# Patient Record
Sex: Male | Born: 1980
Health system: Southern US, Community
[De-identification: ages and names within clinical notes are randomized; demographics above are authoritative.]

## PROBLEM LIST (undated history)

## (undated) DIAGNOSIS — D571 Sickle-cell disease without crisis: Secondary | ICD-10-CM

## (undated) DIAGNOSIS — C801 Malignant (primary) neoplasm, unspecified: Secondary | ICD-10-CM

## (undated) HISTORY — DX: Malignant (primary) neoplasm, unspecified: C80.1

## (undated) HISTORY — PX: CHOLECYSTECTOMY: SHX55

---

## 2007-09-09 ENCOUNTER — Emergency Department (HOSPITAL_COMMUNITY): Admission: EM | Admit: 2007-09-09 | Discharge: 2007-09-09 | Payer: Self-pay | Admitting: Emergency Medicine

## 2007-09-12 ENCOUNTER — Inpatient Hospital Stay (HOSPITAL_COMMUNITY): Admission: EM | Admit: 2007-09-12 | Discharge: 2007-09-15 | Payer: Self-pay | Admitting: Emergency Medicine

## 2007-11-12 ENCOUNTER — Inpatient Hospital Stay (HOSPITAL_COMMUNITY): Admission: EM | Admit: 2007-11-12 | Discharge: 2007-11-13 | Payer: Self-pay | Admitting: Emergency Medicine

## 2007-12-31 ENCOUNTER — Emergency Department (HOSPITAL_COMMUNITY): Admission: EM | Admit: 2007-12-31 | Discharge: 2007-12-31 | Payer: Self-pay | Admitting: Emergency Medicine

## 2008-03-05 ENCOUNTER — Inpatient Hospital Stay (HOSPITAL_COMMUNITY): Admission: EM | Admit: 2008-03-05 | Discharge: 2008-03-10 | Payer: Self-pay | Admitting: Emergency Medicine

## 2008-03-24 ENCOUNTER — Ambulatory Visit: Payer: Self-pay | Admitting: Internal Medicine

## 2008-04-10 ENCOUNTER — Emergency Department (HOSPITAL_COMMUNITY): Admission: EM | Admit: 2008-04-10 | Discharge: 2008-04-10 | Payer: Self-pay | Admitting: Emergency Medicine

## 2009-07-31 ENCOUNTER — Inpatient Hospital Stay (HOSPITAL_COMMUNITY): Admission: EM | Admit: 2009-07-31 | Discharge: 2009-08-06 | Payer: Self-pay | Admitting: Emergency Medicine

## 2009-09-08 ENCOUNTER — Ambulatory Visit: Payer: Self-pay | Admitting: Internal Medicine

## 2009-09-08 LAB — CONVERTED CEMR LAB
Eosinophils Absolute: 0.1 10*3/uL (ref 0.0–0.7)
Eosinophils Relative: 2 % (ref 0–5)
HCT: 28 % — ABNORMAL LOW (ref 39.0–52.0)
Hemoglobin: 9.6 g/dL — ABNORMAL LOW (ref 13.0–17.0)
Lymphs Abs: 2.7 10*3/uL (ref 0.7–4.0)
MCV: 95.2 fL (ref 78.0–100.0)
Monocytes Relative: 10 % (ref 3–12)
RBC: 2.94 M/uL — ABNORMAL LOW (ref 4.22–5.81)
WBC: 5.7 10*3/uL (ref 4.0–10.5)

## 2009-11-16 ENCOUNTER — Ambulatory Visit: Payer: Self-pay | Admitting: Internal Medicine

## 2010-07-22 ENCOUNTER — Inpatient Hospital Stay (HOSPITAL_COMMUNITY): Admission: EM | Admit: 2010-07-22 | Discharge: 2010-03-20 | Payer: Self-pay | Admitting: Emergency Medicine

## 2010-09-04 ENCOUNTER — Inpatient Hospital Stay (HOSPITAL_COMMUNITY)
Admission: EM | Admit: 2010-09-04 | Discharge: 2010-09-09 | Payer: Self-pay | Source: Home / Self Care | Attending: Internal Medicine | Admitting: Internal Medicine

## 2010-09-07 LAB — CBC
HCT: 21.7 % — ABNORMAL LOW (ref 39.0–52.0)
Hemoglobin: 8.1 g/dL — ABNORMAL LOW (ref 13.0–17.0)
MCH: 30.3 pg (ref 26.0–34.0)
MCHC: 35.8 g/dL (ref 30.0–36.0)
MCHC: 36.8 g/dL — ABNORMAL HIGH (ref 30.0–36.0)
MCHC: 36.9 g/dL — ABNORMAL HIGH (ref 30.0–36.0)
Platelets: 502 10*3/uL — ABNORMAL HIGH (ref 150–400)
RDW: 19.9 % — ABNORMAL HIGH (ref 11.5–15.5)
RDW: 19.8 % — ABNORMAL HIGH (ref 11.5–15.5)
WBC: 11.5 10*3/uL — ABNORMAL HIGH (ref 4.0–10.5)

## 2010-09-07 LAB — URINALYSIS, DIPSTICK ONLY
Ketones, ur: NEGATIVE mg/dL
Nitrite: NEGATIVE
Protein, ur: NEGATIVE mg/dL
Specific Gravity, Urine: 1.013 (ref 1.005–1.030)
Urine Glucose, Fasting: NEGATIVE mg/dL
pH: 6 (ref 5.0–8.0)

## 2010-09-07 LAB — BASIC METABOLIC PANEL
BUN: 7 mg/dL (ref 6–23)
Calcium: 8.5 mg/dL (ref 8.4–10.5)
Creatinine, Ser: 0.8 mg/dL (ref 0.4–1.5)
Glucose, Bld: 131 mg/dL — ABNORMAL HIGH (ref 70–99)
Potassium: 4 mEq/L (ref 3.5–5.1)

## 2010-09-07 LAB — DIFFERENTIAL
Basophils Absolute: 0 10*3/uL (ref 0.0–0.1)
Basophils Relative: 0 % (ref 0–1)
Basophils Relative: 0 % (ref 0–1)
Eosinophils Absolute: 0.1 10*3/uL (ref 0.0–0.7)
Eosinophils Absolute: 0.1 10*3/uL (ref 0.0–0.7)
Eosinophils Absolute: 0.1 10*3/uL (ref 0.0–0.7)
Eosinophils Relative: 0 % (ref 0–5)
Eosinophils Relative: 1 % (ref 0–5)
Lymphocytes Relative: 22 % (ref 12–46)
Lymphs Abs: 2.4 10*3/uL (ref 0.7–4.0)
Monocytes Absolute: 1.5 10*3/uL — ABNORMAL HIGH (ref 0.1–1.0)
Neutro Abs: 13.3 10*3/uL — ABNORMAL HIGH (ref 1.7–7.7)
Neutro Abs: 6.4 10*3/uL (ref 1.7–7.7)
Neutrophils Relative %: 64 % (ref 43–77)

## 2010-09-07 LAB — COMPREHENSIVE METABOLIC PANEL
ALT: 16 U/L (ref 0–53)
AST: 35 U/L (ref 0–37)
Albumin: 3.7 g/dL (ref 3.5–5.2)
Alkaline Phosphatase: 51 U/L (ref 39–117)
Calcium: 9 mg/dL (ref 8.4–10.5)
GFR calc Af Amer: >60 mL/min (ref >60)
Potassium: 4.1 mEq/L (ref 3.5–5.1)
Sodium: 138 mEq/L (ref 135–145)
Total Protein: 7.7 g/dL (ref 6.0–8.3)

## 2010-09-07 LAB — RAPID URINE DRUG SCREEN, HOSP PERFORMED: Benzodiazepines: NOT DETECTED

## 2010-09-07 LAB — TSH
TSH: 1.741 u[IU]/mL (ref 0.350–4.500)
TSH: 1.122 u[IU]/mL (ref 0.350–4.500)

## 2010-09-07 LAB — RETICULOCYTES
Retic Count, Absolute: 247.8 10*3/uL — ABNORMAL HIGH (ref 19.0–186.0)
Retic Ct Pct: 8.4 % — ABNORMAL HIGH (ref 0.4–3.1)

## 2010-09-07 LAB — URINE CULTURE
Colony Count: NO GROWTH
Culture: NO GROWTH

## 2010-09-08 LAB — CBC
MCH: 29.8 pg (ref 26.0–34.0)
MCH: 28.9 pg (ref 26.0–34.0)
MCHC: 36.8 g/dL — ABNORMAL HIGH (ref 30.0–36.0)
MCV: 80.9 fL (ref 78.0–100.0)
MCV: 81.6 fL (ref 78.0–100.0)
Platelets: 313 10*3/uL (ref 150–400)
Platelets: 321 10*3/uL (ref 150–400)
RBC: 2.35 MIL/uL — ABNORMAL LOW (ref 4.22–5.81)
RDW: 18.7 % — ABNORMAL HIGH (ref 11.5–15.5)
WBC: 8.5 10*3/uL (ref 4.0–10.5)

## 2010-09-08 LAB — CROSSMATCH

## 2010-09-09 LAB — CBC
Hemoglobin: 8 g/dL — ABNORMAL LOW (ref 13.0–17.0)
MCH: 29.7 pg (ref 26.0–34.0)
MCV: 82.9 fL (ref 78.0–100.0)
RBC: 2.69 MIL/uL — ABNORMAL LOW (ref 4.22–5.81)

## 2010-09-09 LAB — COMPREHENSIVE METABOLIC PANEL
Albumin: 3.6 g/dL (ref 3.5–5.2)
BUN: 5 mg/dL — ABNORMAL LOW (ref 6–23)
Creatinine, Ser: 0.7 mg/dL (ref 0.4–1.5)
GFR calc Af Amer: >60 mL/min (ref >60)
Potassium: 4.4 mEq/L (ref 3.5–5.1)
Total Protein: 8 g/dL (ref 6.0–8.3)

## 2010-09-11 LAB — CULTURE, BLOOD (ROUTINE X 2)
Culture  Setup Time: 201201221142
Culture: NO GROWTH
Culture: NO GROWTH

## 2010-09-13 ENCOUNTER — Encounter (INDEPENDENT_AMBULATORY_CARE_PROVIDER_SITE_OTHER): Payer: Self-pay | Admitting: *Deleted

## 2010-09-13 LAB — CONVERTED CEMR LAB
Cocaine Metabolites: NEGATIVE
Creatinine,U: 106.1 mg/dL
Marijuana Metabolite: NEGATIVE
Opiate Screen, Urine: NEGATIVE
Phencyclidine (PCP): NEGATIVE

## 2010-09-16 NOTE — Discharge Summary (Signed)
  NAMEHUTCHINSON, ISENBERG               ACCOUNT NO.:  [id number]  MEDICAL RECORD NO.:  [id number]          PATIENT TYPE:  INP  LOCATION:  1309                         FACILITY:  [facility]  PHYSICIAN:  Pincus Large, MD     DATE OF BIRTH:  Jun 07, 1981  DATE OF ADMISSION:  09/04/2010 DATE OF DISCHARGE:  09/06/2010                              DISCHARGE SUMMARY   PRIMARY CARE PHYSICIAN:  Dineen Kid. Reche Dixon, M.D. at [facility].  REASON FOR ADMISSION:  Pain all over his back and right hip.  DISCHARGE DIAGNOSES: 1. Sickle cell crisis. 2. Leukocytosis. 3. Sickle cell anemia.  MEDICATIONS ON DISCHARGE: 1. Folic acid 1 mg daily. 2. Oxycodone/acetaminophen 10/650 mg 1 tablet q.4 h. p.r.n. 3. Hydroxyurea 500 mg 2 tablets b.i.d.  HISTORY OF PRESENT ILLNESS:  A 30 year old African American male with past medical history of sickle cell anemia, who presented to [facility] Emergency Room with complaint of back pain and right hip pain similar to a sickle cell crisis.  The patient is stating that he is having pain in his back off and on over the past several days; however, it became __________ worse, described pain as throbbing and aching in nature, was typically for his crisis.  The patient was treated in the emergency room initially and he had some mild relief, and it was found that his WBC on admission was 16.8.  PAST MEDICAL HISTORY:  As per the H and P.  PHYSICAL EXAMINATION:  As per the H and P.  HOSPITAL COURSE:  The patient was admitted to hospitalist service for future treatment of the sickle cell crisis.  He received IV fluid 150 cc/hour.  The patient also received oxygen to keep sats above 90.  The patient also received IV pain medication, Dilaudid every 2 hours.  A chest x-ray was done, which shows a stable cardiomegaly with pulmonary vascular congestion.  No acute findings.  Hip x-ray was done, which shows normal exam.  The patient's blood count dropped to 7 on January 23rd.  He was  getting 1 unit of blood transfusion.  He said that his normal blood count runs around 8.  The patient's vital signs, temperature is 99.4, pulse is 96, respirations 18, blood pressure is 127/73, saturating at 96% on room air.  DISPOSITION:  Home.  DIET:  Regular.  FOLLOWUP:  Follow up with primary care in 2 weeks.          ______________________________ Pincus Large, MD     SA/MEDQ  D:  09/07/2010  T:  09/07/2010  Job:  [id number]  Electronically Signed by Pincus Large MD on 09/16/2010 03:33:30 PM

## 2010-09-16 NOTE — H&P (Signed)
NAMEAJANI, [name]               ACCOUNT NO.:  [id number]  MEDICAL RECORD NO.:  [id number]          PATIENT TYPE:  INP  LOCATION:  0101                         FACILITY:  [facility]  PHYSICIAN:  Pincus Large, MD     DATE OF BIRTH:  1981/06/30  DATE OF ADMISSION:  09/04/2010 DATE OF DISCHARGE:                             HISTORY & PHYSICAL   PRIMARY CARE PHYSICIAN:  Dineen Kid. Reche Dixon, MD, [facility].  CHIEF COMPLAINT:  "Pain all over my body except in the lower back."  HISTORY OF PRESENT ILLNESS:  [name] is a 30 year old African American male with a history of sickle cell anemia, sickle cell disease who presented to [facility] with complaint of back pain, rib pain, right hip pain, similar to his sickle cell crisis.  The pain was described as 8/10, dull aching in the lower back, rib pain and pain in the right hip.  The patient denies any chest pain.  He denies any shortness of breath.  He denies any cough.  He denies any fever. Otherwise, the patient does not have any dyspnea.  He denies any abdominal pain.  No nausea or vomiting.  No headache.  No blurring of vision.  In the ER, he was found to have white blood cell of 16,000 and his hemoglobin was 8.  PAST MEDICAL HISTORY:  Sickle cell.  PAST SURGICAL HISTORY:  None.  SOCIAL HISTORY:  Denies any alcohol or smoking.  ALLERGY:  No known drug allergies.  REVIEW OF SYSTEMS:  All systems were reviewed including HEENT, GI, GU, CVS, CNS, allergy-immunology including musculoskeletal.  Findings were except for what is mentioned in the H and P.  PHYSICAL EXAMINATION:  GENERAL:  He is awake, oriented with mild distress.  HEENT:  Normocephalic, atraumatic.  Conjunctivae pink. Pupils equal, reactive to light.  NECK:  Supple.  No JVD.  No thyromegaly.  CARDIOVASCULAR:  S1, S2, tachycardia.  No murmurs, gallops or rubs bilaterally.  LUNGS:  No wheezes or crackles.  ABDOMEN:  Soft, nontender, nondistended.  Normal bowel sounds.   EXTREMITIES:  No joint effusion or any joint tenderness.  SKIN:  No rash.  NEUROLOGIC: Nonfocal.  PERTINENT LABORATORY AND X-RAY DATA:  Lab shows WBC is 16.8, hemoglobin is 8.8, platelet is 502, reticulocyte count is 8.4.  X-ray was not done.  ASSESSMENT AND PLAN: 1. Acute sickle cell crisis:  The patient will be placed on IV fluids,     oxygen supplement, pain control.  Will start Dilaudid p.r.n. with     oxycodone for breakthrough pain.  Also continue with hydroxyurea     and folic acid. 2. Leukocytosis:  The patient is afebrile and does not appear to have     any source of infection.  His WBC is     16,000.  I will start the patient empirically on Rocephin, get a     chest x-ray and x-ray of the right hip. 3. Acute sickle cell anemia:  Currently hemoglobin is 8.  I will     transfuse only if the H and H is less than 8. 4. Prophylaxis:  Lovenox for deep  venous thrombosis prophylaxis.          ______________________________ Pincus Large, MD     SA/MEDQ  D:  09/04/2010  T:  09/04/2010  Job:  [id number]  Electronically Signed by Pincus Large MD on 09/16/2010 03:33:32 PM

## 2010-09-30 NOTE — Discharge Summary (Signed)
  [name], [name]               ACCOUNT NO.:  [id number]  MEDICAL RECORD NO.:  [id number]          PATIENT TYPE:  INP  LOCATION:  1309                         FACILITY:  [facility]  PHYSICIAN:  Izeah Vossler L. August Saucer, M.D.     DATE OF BIRTH:  [birth year]  DATE OF ADMISSION:  09/04/2010 DATE OF DISCHARGE:  09/09/2010                              DISCHARGE SUMMARY   FINAL DIAGNOSES: 1. Sickle cell crisis. 2. Colonic dysfunction. 3. Transient leukocytosis, resolved.  OPERATIONS AND PROCEDURES:  Transfusion of 1 unit of packed RBCs.  HISTORY OF PRESENT ILLNESS:  [name] is a 30 year old black male with history of sickle cell anemia who presented to [facility] with complaints of back pain, rib pain, and right hip pain similar to his sickle cell crisis.  The patient described the pain as 8/10 which was a dull ache in the lower back, ribs, and right hip.  The patient denied any chest pain.  He denied any shortness of breath or cough.  There was no fever as well.  The patient was treated initially in the emergency room.  He was found at the time of evaluation to have a hemoglobin of 8 with a white count of 16,000.  After failure to improve, he was admitted to the hospitalist service for further therapy.  His primary care physician is Dr. Reche Dixon at [facility].  Past medical history and physical exam as per admission H and P.  HOSPITAL COURSE:  The patient was admitted for further treatment of acute sickle cell crisis.  He was noted to have significant leukocytosis as well.  He was subsequently started on Rocephin empirically for occult infection.  He was also placed on Lovenox for DVT prophylaxis.  The patient was maintained on IV fluids as well as Dilaudid for control of his pain.  The patient was continued over the subsequent days with the same regimen with slow improvement.  The patient was subsequently transferred to my service on September 08, 2010.  At that time, he was making  considerable progress with his pain being decreased to 5/10.  He felt that he could manage his pain at home.  His medication was adjusted thereafter.  He was started on p.o. Percocet.  The following day, he continued to improve.  He felt that his pain was stable.  The patient was subsequently felt to be stable for discharge.  He was subsequently discharged home on September 09, 2010.  DISCHARGE MEDICATIONS: 1. Dilaudid 4 mg q.4 h. p.r.n. 2. Folic acid 1 mg daily. 3. Hydroxyurea 500 mg 2 capsules b.i.d. 4. Oxycodone with acetaminophen 1-2 tablets q.4 h. p.r.n.  DIET:  The patient will be maintained on a regular diet.  FOLLOWUP:  He has been advised to follow up with Dr. Reche Dixon in 2 weeks' time. Total time for discharge was greater than 30 minutes.          ______________________________ Lind Guest August Saucer, M.D.     ELD/MEDQ  D:  09/29/2010  T:  09/30/2010  Job:  [id number]  Electronically Signed by Willey Blade M.D. on 09/30/2010 07:27:48 AM

## 2010-10-29 LAB — RETICULOCYTES
RBC.: 3.13 MIL/uL — ABNORMAL LOW (ref 4.22–5.81)
Retic Count, Absolute: 194.1 10*3/uL — ABNORMAL HIGH (ref 19.0–186.0)

## 2010-10-29 LAB — URINALYSIS, ROUTINE W REFLEX MICROSCOPIC
Glucose, UA: NEGATIVE mg/dL
Ketones, ur: NEGATIVE mg/dL
Nitrite: NEGATIVE
Protein, ur: NEGATIVE mg/dL
Urobilinogen, UA: 1 mg/dL (ref 0.0–1.0)

## 2010-10-29 LAB — CBC
HCT: 27.1 % — ABNORMAL LOW (ref 39.0–52.0)
HCT: 29.4 % — ABNORMAL LOW (ref 39.0–52.0)
Hemoglobin: 10.1 g/dL — ABNORMAL LOW (ref 13.0–17.0)
Hemoglobin: 7.7 g/dL — ABNORMAL LOW (ref 13.0–17.0)
Hemoglobin: 9.2 g/dL — ABNORMAL LOW (ref 13.0–17.0)
MCHC: 34.5 g/dL (ref 30.0–36.0)
MCV: 92.3 fL (ref 78.0–100.0)
MCV: 93.9 fL (ref 78.0–100.0)
MCV: 94.1 fL (ref 78.0–100.0)
Platelets: 298 10*3/uL (ref 150–400)
Platelets: 311 10*3/uL (ref 150–400)
RBC: 2.47 MIL/uL — ABNORMAL LOW (ref 4.22–5.81)
RBC: 3.18 MIL/uL — ABNORMAL LOW (ref 4.22–5.81)
RDW: 19.6 % — ABNORMAL HIGH (ref 11.5–15.5)
RDW: 20.2 % — ABNORMAL HIGH (ref 11.5–15.5)
RDW: 20.3 % — ABNORMAL HIGH (ref 11.5–15.5)
WBC: 10.4 10*3/uL (ref 4.0–10.5)
WBC: 16.2 10*3/uL — ABNORMAL HIGH (ref 4.0–10.5)
WBC: 21.1 10*3/uL — ABNORMAL HIGH (ref 4.0–10.5)
WBC: 8.3 10*3/uL (ref 4.0–10.5)
WBC: 9 10*3/uL (ref 4.0–10.5)

## 2010-10-29 LAB — CK TOTAL AND CKMB (NOT AT ARMC)
CK, MB: 0.4 ng/mL (ref 0.3–4.0)
CK, MB: 0.4 ng/mL (ref 0.3–4.0)
Relative Index: INVALID (ref 0.0–2.5)
Relative Index: INVALID (ref 0.0–2.5)
Total CK: 31 U/L (ref 7–232)
Total CK: 33 U/L (ref 7–232)
Total CK: 39 U/L (ref 7–232)

## 2010-10-29 LAB — BASIC METABOLIC PANEL
BUN: 6 mg/dL (ref 6–23)
Chloride: 104 mEq/L (ref 96–112)
GFR calc Af Amer: >60 mL/min (ref >60)
GFR calc non Af Amer: >60 mL/min (ref >60)
Potassium: 4.2 mEq/L (ref 3.5–5.1)
Potassium: 4.4 mEq/L (ref 3.5–5.1)
Sodium: 139 mEq/L (ref 135–145)

## 2010-10-29 LAB — CULTURE, BLOOD (ROUTINE X 2): Culture: NO GROWTH

## 2010-10-29 LAB — DIFFERENTIAL
Basophils Relative: 0 % (ref 0–1)
Eosinophils Relative: 0 % (ref 0–5)
Lymphocytes Relative: 12 % (ref 12–46)
Monocytes Relative: 1 % — ABNORMAL LOW (ref 3–12)
Neutro Abs: 18.4 10*3/uL — ABNORMAL HIGH (ref 1.7–7.7)

## 2010-10-29 LAB — COMPREHENSIVE METABOLIC PANEL
AST: 24 U/L (ref 0–37)
Albumin: 3.8 g/dL (ref 3.5–5.2)
Alkaline Phosphatase: 53 U/L (ref 39–117)
Chloride: 106 mEq/L (ref 96–112)
GFR calc Af Amer: >60 mL/min (ref >60)
Potassium: 3.7 mEq/L (ref 3.5–5.1)
Total Bilirubin: 2.7 mg/dL — ABNORMAL HIGH (ref 0.3–1.2)

## 2010-11-08 ENCOUNTER — Inpatient Hospital Stay (HOSPITAL_COMMUNITY)
Admission: EM | Admit: 2010-11-08 | Discharge: 2010-11-20 | DRG: 419 | Disposition: A | Discharge status: Home / Self Care | Payer: Medicare Other | Attending: General Surgery | Admitting: General Surgery

## 2010-11-08 DIAGNOSIS — Z79899 Other long term (current) drug therapy: Secondary | ICD-10-CM

## 2010-11-08 DIAGNOSIS — R7402 Elevation of levels of lactic acid dehydrogenase (LDH): Secondary | ICD-10-CM | POA: Diagnosis present

## 2010-11-08 DIAGNOSIS — R7401 Elevation of levels of liver transaminase levels: Secondary | ICD-10-CM | POA: Diagnosis present

## 2010-11-08 DIAGNOSIS — R7982 Elevated C-reactive protein (CRP): Secondary | ICD-10-CM | POA: Diagnosis present

## 2010-11-08 DIAGNOSIS — K8060 Calculus of gallbladder and bile duct with cholecystitis, unspecified, without obstruction: Principal | ICD-10-CM | POA: Diagnosis present

## 2010-11-08 DIAGNOSIS — I498 Other specified cardiac arrhythmias: Secondary | ICD-10-CM | POA: Diagnosis present

## 2010-11-08 DIAGNOSIS — D6959 Other secondary thrombocytopenia: Secondary | ICD-10-CM | POA: Diagnosis present

## 2010-11-08 DIAGNOSIS — D571 Sickle-cell disease without crisis: Secondary | ICD-10-CM | POA: Diagnosis present

## 2010-11-08 DIAGNOSIS — K8064 Calculus of gallbladder and bile duct with chronic cholecystitis without obstruction: Principal | ICD-10-CM | POA: Diagnosis present

## 2010-11-08 HISTORY — DX: Sickle-cell disease without crisis: D57.1

## 2010-11-08 LAB — CBC
HCT: 27.6 % — ABNORMAL LOW (ref 39.0–52.0)
MCHC: 35.1 g/dL (ref 30.0–36.0)
MCV: 87.3 fL (ref 78.0–100.0)
RDW: 19.5 % — ABNORMAL HIGH (ref 11.5–15.5)

## 2010-11-08 LAB — URINALYSIS, ROUTINE W REFLEX MICROSCOPIC
Ketones, ur: NEGATIVE mg/dL
Nitrite: NEGATIVE
Protein, ur: NEGATIVE mg/dL
Urobilinogen, UA: 4 mg/dL — ABNORMAL HIGH (ref 0.0–1.0)
pH: 7.5 (ref 5.0–8.0)

## 2010-11-08 LAB — DIFFERENTIAL
Eosinophils Relative: 2 % (ref 0–5)
Lymphs Abs: 3.5 10*3/uL (ref 0.7–4.0)
Monocytes Relative: 11 % (ref 3–12)
Neutro Abs: 3.3 10*3/uL (ref 1.7–7.7)

## 2010-11-08 LAB — COMPREHENSIVE METABOLIC PANEL
Alkaline Phosphatase: 132 U/L — ABNORMAL HIGH (ref 39–117)
BUN: 4 mg/dL — ABNORMAL LOW (ref 6–23)
GFR calc non Af Amer: >60 mL/min (ref >60)
Glucose, Bld: 87 mg/dL (ref 70–99)
Potassium: 4 mEq/L (ref 3.5–5.1)
Total Protein: 8.6 g/dL — ABNORMAL HIGH (ref 6.0–8.3)

## 2010-11-08 LAB — LIPASE, BLOOD: Lipase: 18 U/L (ref 11–59)

## 2010-11-09 ENCOUNTER — Inpatient Hospital Stay (HOSPITAL_COMMUNITY): Payer: Medicare Other

## 2010-11-09 ENCOUNTER — Emergency Department (HOSPITAL_COMMUNITY): Payer: Medicare Other

## 2010-11-09 ENCOUNTER — Encounter (HOSPITAL_COMMUNITY): Payer: Self-pay | Admitting: Radiology

## 2010-11-09 LAB — RETICULOCYTES: Retic Ct Pct: 11.4 % — ABNORMAL HIGH (ref 0.4–3.1)

## 2010-11-09 LAB — BILIRUBIN, TOTAL: Total Bilirubin: 15.3 mg/dL — ABNORMAL HIGH (ref 0.3–1.2)

## 2010-11-09 LAB — PROTIME-INR: INR: 1.24 (ref 0.00–1.49)

## 2010-11-09 LAB — C-REACTIVE PROTEIN: CRP: 0.8 mg/dL — ABNORMAL HIGH (ref <0.6)

## 2010-11-09 LAB — LACTATE DEHYDROGENASE: LDH: 449 U/L — ABNORMAL HIGH (ref 94–250)

## 2010-11-09 MED ORDER — TECHNETIUM TC 99M MEBROFENIN IV KIT
5.0000 | PACK | Freq: Once | INTRAVENOUS | Status: AC | PRN
  Administered 2010-11-09: 5 via INTRAVENOUS

## 2010-11-09 NOTE — H&P (Signed)
[name], [name]               ACCOUNT NO.:  [id number]  MEDICAL RECORD NO.:  [id number]           PATIENT TYPE:  E  LOCATION:  WLED                         FACILITY:  [facility]  PHYSICIAN:  Angelia Mould. Derrell Lolling, M.D.DATE OF BIRTH:  [birth year]  DATE OF ADMISSION:  11/08/2010 DATE OF DISCHARGE:                             HISTORY & PHYSICAL   CHIEF COMPLAINT:  Abdominal pain, gallstones, jaundice.  HISTORY:  This is a 30 year old African American male with a history of sickle cell disease, history of multiple prior siclke crises, and history of multiple transfusions.  He presented to the emergency room last night with a 2-day history of right-sided abdominal pain, nausea, and vomiting.  He says that it is worse when he eats.  He has had milder episodes before, but has not sought medical attention in the past.  This pain is different than his usual sickle cell crises which usually cause back and shoulder and rib pain.  He noted that his eyes were yellow today.  He denies fever or chills.  In the emergency room, he was found to be stable and alert, but does have scleral icterus.  Ultrasound shows sludge and stones in the gallbladder and also shows the common bile duct at 6-mm diameter with a possible sludge.  Total bilirubin is 16.8, but alkaline phosphatase is only 132, white blood cell count is 8000.  He is being admitted for further evaluation and management of his biliary tract disease as well as his sickle cell disease.  PAST HISTORY:  Sickle cell disease requiring transfusions in the past. Denies history of hepatitis, HIV, or any liver disease.  CURRENT MEDICATIONS: 1. Hydroxyurea 1000 mg twice a day. 2. Percocet p.r.n.  DRUG ALLERGIES:  None known.  SOCIAL HISTORY:  He is single.  He is a self-employed Audiological scientist.  He lives alone.  He does have 1 daughter.  Denies tobacco, alcohol, or any drug use.  FAMILY HISTORY:  Mother living, has rheumatoid  arthritis.  He does not know his biological father.  He has 6 siblings, 2 of which have sickle cell trait.  REVIEW OF SYSTEMS:  All systems reviewed.  Ten-system review of systems is negative except as described above.  PHYSICAL EXAMINATION:  GENERAL:  Thin, healthy-appearing, pleasant African American male in minimal distress.  Very cooperative. VITAL SIGNS:  Temperature 97.9, heart rate 68, blood pressure 105/69, respiratory rate 18, oxygen saturation anywhere from 95% to 99% on room air. HEENT:  Eyes:  Obvious scleral icterus.  Extraocular movements intact. Ears, mouth, throat, nose, lips, tongue, and oropharynx are without gross lesions. NECK:  Supple, nontender.  No mass.  No jugular venous distention. LUNGS:  Clear to auscultation.  The only chest wall tenderness is in the right anterior costal region. HEART:  Regular rate and rhythm, rare extrasystole.  No murmurs.  Radial and femoral pulses are palpable. ABDOMEN:  He is tender with a little bit of guarding in the right upper quadrant.  He is fairly soft elsewhere.  I do not feel a mass.  I do not see any scars.  I do not feel  any hernias.  No inguinal mass. EXTREMITIES:  He moves all 4 extremities well without pain or deformity. NEUROLOGIC:  No gross motor or sensory deficits.  ADMISSION DATA:  Ultrasound shows gallstones and gallbladder sludge and common bile duct sludge and a common bile duct diameter of 6 mm.  There was no obvious evidence of acute inflammation of the gallbladder by ultrasound.  His chest x-ray is unremarkable.  Total bilirubin 6.8, alkaline phosphatase 132, SGOT 325, lipase 18, hemoglobin 9.7, white count 8000.  Sodium 135, potassium 4.0, BUN 4, creatinine 0.63, glucose 87.  ASSESSMENT: 1. Abdominal pain, gallstones, and jaundice.  It seems quite possible     that all of his symptoms are due to biliary tract disease.  The     stones are likely due to his hemolytic anemia.  It is not clear,      however, whether his jaundice is obstructive jaundice or not. 2. Sickle cell disease, current symptoms seem more like biliary tract     disease and less likely a sickle cell crisis. 3. Possible active hemolysis.  PLAN: 1. The patient will be admitted for IV hydration and pain control. 2. He will be started on IV antibiotics. 3. He will be kept at bowel rest. 4. We will proceed with hepatobiliary scan now to see if there is any     obvious obstruction of the gallbladder or common bile duct.  If the     common bile duct appears obstructed, then we certainly might need     to ask Gastroenterology to see him about a preoperative ERCP. 5. Will check direct and indirect bilirubin levels.  We are going to ask the [facility] to see him to make sure that his hemolytic anemia and sickle cell disease is currently in remission and to make sure that he is not actively hemolyzing.  Eventually, the patient will likely benefit from a cholecystectomy, although this is not an emergent decision at this time.     Angelia Mould. Derrell Lolling, M.D.     HMI/MEDQ  D:  11/09/2010  T:  11/09/2010  Job:  [id number]  Electronically Signed by Claud Kelp M.D. on 11/09/2010 09:55:04 AM

## 2010-11-10 LAB — COMPREHENSIVE METABOLIC PANEL
AST: 150 U/L — ABNORMAL HIGH (ref 0–37)
BUN: 4 mg/dL — ABNORMAL LOW (ref 6–23)
CO2: 27 mEq/L (ref 19–32)
Calcium: 8.9 mg/dL (ref 8.4–10.5)
Chloride: 105 mEq/L (ref 96–112)
Creatinine, Ser: 0.81 mg/dL (ref 0.4–1.5)
GFR calc Af Amer: >60 mL/min (ref >60)
GFR calc non Af Amer: >60 mL/min (ref >60)
Total Bilirubin: 12.3 mg/dL — ABNORMAL HIGH (ref 0.3–1.2)

## 2010-11-10 LAB — PROTIME-INR
INR: 1.12 (ref 0.00–1.49)
Prothrombin Time: 14.6 seconds (ref 11.6–15.2)

## 2010-11-10 LAB — GAMMA GT: GGT: 143 U/L — ABNORMAL HIGH (ref 7–51)

## 2010-11-10 LAB — CBC
Platelets: 482 10*3/uL — ABNORMAL HIGH (ref 150–400)
RBC: 2.73 MIL/uL — ABNORMAL LOW (ref 4.22–5.81)
RDW: 18.3 % — ABNORMAL HIGH (ref 11.5–15.5)
WBC: 7.7 10*3/uL (ref 4.0–10.5)

## 2010-11-10 LAB — RETICULOCYTES
Retic Count, Absolute: 223.9 10*3/uL — ABNORMAL HIGH (ref 19.0–186.0)
Retic Ct Pct: 8.2 % — ABNORMAL HIGH (ref 0.4–3.1)

## 2010-11-10 LAB — APTT: aPTT: 35 seconds (ref 24–37)

## 2010-11-11 LAB — RETICULOCYTES: Retic Ct Pct: 7.2 % — ABNORMAL HIGH (ref 0.4–3.1)

## 2010-11-11 LAB — CBC
MCH: 30.1 pg (ref 26.0–34.0)
MCHC: 35 g/dL (ref 30.0–36.0)
MCV: 86.2 fL (ref 78.0–100.0)
Platelets: 470 10*3/uL — ABNORMAL HIGH (ref 150–400)
RDW: 18.4 % — ABNORMAL HIGH (ref 11.5–15.5)

## 2010-11-11 LAB — ANTI-MICROSOMAL ANTIBODY LIVER / KIDNEY: Liver-Kidney Microsomal Ab: <=20 U

## 2010-11-11 LAB — COMPREHENSIVE METABOLIC PANEL
AST: 164 U/L — ABNORMAL HIGH (ref 0–37)
BUN: 4 mg/dL — ABNORMAL LOW (ref 6–23)
CO2: 28 mEq/L (ref 19–32)
Calcium: 8.9 mg/dL (ref 8.4–10.5)
Chloride: 104 mEq/L (ref 96–112)
Creatinine, Ser: 0.76 mg/dL (ref 0.4–1.5)
GFR calc Af Amer: >60 mL/min (ref >60)
GFR calc non Af Amer: >60 mL/min (ref >60)
Glucose, Bld: 95 mg/dL (ref 70–99)
Total Bilirubin: 14.8 mg/dL — ABNORMAL HIGH (ref 0.3–1.2)

## 2010-11-11 LAB — LACTATE DEHYDROGENASE: LDH: 369 U/L — ABNORMAL HIGH (ref 94–250)

## 2010-11-11 LAB — HEPATITIS PANEL, ACUTE: Hep B C IgM: NEGATIVE

## 2010-11-12 ENCOUNTER — Inpatient Hospital Stay (HOSPITAL_COMMUNITY): Payer: Medicare Other

## 2010-11-12 LAB — EPSTEIN-BARR VIRUS VCA ANTIBODY PANEL
EBV NA IgG: 2 {ISR} — ABNORMAL HIGH
EBV VCA IgM: 0.06 {ISR}

## 2010-11-12 LAB — CBC
HCT: 24.9 % — ABNORMAL LOW (ref 39.0–52.0)
Hemoglobin: 8.6 g/dL — ABNORMAL LOW (ref 13.0–17.0)
MCHC: 34.5 g/dL (ref 30.0–36.0)
MCV: 85 fL (ref 78.0–100.0)

## 2010-11-12 LAB — ANTI-SMOOTH MUSCLE ANTIBODY, IGG: F-Actin IgG: 14 U (ref <20)

## 2010-11-12 LAB — COMPREHENSIVE METABOLIC PANEL
ALT: 286 U/L — ABNORMAL HIGH (ref 0–53)
Alkaline Phosphatase: 145 U/L — ABNORMAL HIGH (ref 39–117)
BUN: 4 mg/dL — ABNORMAL LOW (ref 6–23)
CO2: 25 mEq/L (ref 19–32)
GFR calc non Af Amer: >60 mL/min (ref >60)
Glucose, Bld: 81 mg/dL (ref 70–99)
Potassium: 4.1 mEq/L (ref 3.5–5.1)
Sodium: 137 mEq/L (ref 135–145)

## 2010-11-13 LAB — COMPREHENSIVE METABOLIC PANEL
ALT: 319 U/L — ABNORMAL HIGH (ref 0–53)
AST: 199 U/L — ABNORMAL HIGH (ref 0–37)
CO2: 28 mEq/L (ref 19–32)
Chloride: 103 mEq/L (ref 96–112)
GFR calc Af Amer: >60 mL/min (ref >60)
GFR calc non Af Amer: >60 mL/min (ref >60)
Sodium: 138 mEq/L (ref 135–145)
Total Bilirubin: 20.3 mg/dL (ref 0.3–1.2)

## 2010-11-13 LAB — CBC
Hemoglobin: 9.5 g/dL — ABNORMAL LOW (ref 13.0–17.0)
MCH: 30 pg (ref 26.0–34.0)
RBC: 3.17 MIL/uL — ABNORMAL LOW (ref 4.22–5.81)

## 2010-11-14 LAB — COMPREHENSIVE METABOLIC PANEL
Alkaline Phosphatase: 168 U/L — ABNORMAL HIGH (ref 39–117)
BUN: 4 mg/dL — ABNORMAL LOW (ref 6–23)
Calcium: 9.1 mg/dL (ref 8.4–10.5)
Glucose, Bld: 100 mg/dL — ABNORMAL HIGH (ref 70–99)
Total Protein: 7.7 g/dL (ref 6.0–8.3)

## 2010-11-14 LAB — CBC
HCT: 24.8 % — ABNORMAL LOW (ref 39.0–52.0)
MCHC: 35.5 g/dL (ref 30.0–36.0)
MCV: 83.5 fL (ref 78.0–100.0)
RDW: 17.2 % — ABNORMAL HIGH (ref 11.5–15.5)

## 2010-11-15 LAB — DIFFERENTIAL
Basophils Absolute: 0.1 10*3/uL (ref 0.0–0.1)
Basophils Relative: 0 % (ref 0–1)
Eosinophils Absolute: 0 10*3/uL (ref 0.0–0.7)
Eosinophils Relative: 0 % (ref 0–5)
Eosinophils Relative: 1 % (ref 0–5)
Lymphocytes Relative: 13 % (ref 12–46)
Lymphs Abs: 3.2 10*3/uL (ref 0.7–4.0)
Monocytes Absolute: 0.9 10*3/uL (ref 0.1–1.0)
Neutrophils Relative %: 71 % (ref 43–77)
Neutrophils Relative %: 81 % — ABNORMAL HIGH (ref 43–77)

## 2010-11-15 LAB — BASIC METABOLIC PANEL
BUN: 4 mg/dL — ABNORMAL LOW (ref 6–23)
BUN: 5 mg/dL — ABNORMAL LOW (ref 6–23)
BUN: 6 mg/dL (ref 6–23)
CO2: 27 mEq/L (ref 19–32)
CO2: 28 mEq/L (ref 19–32)
CO2: 31 mEq/L (ref 19–32)
Calcium: 8.7 mg/dL (ref 8.4–10.5)
Chloride: 103 mEq/L (ref 96–112)
Chloride: 103 mEq/L (ref 96–112)
Chloride: 98 mEq/L (ref 96–112)
Chloride: 99 mEq/L (ref 96–112)
Creatinine, Ser: 0.67 mg/dL (ref 0.4–1.5)
Creatinine, Ser: 0.79 mg/dL (ref 0.4–1.5)
GFR calc Af Amer: >60 mL/min (ref >60)
GFR calc Af Amer: >60 mL/min (ref >60)
GFR calc Af Amer: >60 mL/min (ref >60)
GFR calc Af Amer: >60 mL/min (ref >60)
Glucose, Bld: 112 mg/dL — ABNORMAL HIGH (ref 70–99)
Potassium: 4.1 mEq/L (ref 3.5–5.1)
Potassium: 3.9 mEq/L (ref 3.5–5.1)
Potassium: 4.3 mEq/L (ref 3.5–5.1)
Sodium: 135 mEq/L (ref 135–145)
Sodium: 133 mEq/L — ABNORMAL LOW (ref 135–145)
Sodium: 138 mEq/L (ref 135–145)

## 2010-11-15 LAB — CBC
HCT: 25.6 % — ABNORMAL LOW (ref 39.0–52.0)
HCT: 21.7 % — ABNORMAL LOW (ref 39.0–52.0)
HCT: 22.1 % — ABNORMAL LOW (ref 39.0–52.0)
HCT: 23.4 % — ABNORMAL LOW (ref 39.0–52.0)
HCT: 25 % — ABNORMAL LOW (ref 39.0–52.0)
HCT: 30.3 % — ABNORMAL LOW (ref 39.0–52.0)
Hemoglobin: 10.2 g/dL — ABNORMAL LOW (ref 13.0–17.0)
Hemoglobin: 7 g/dL — ABNORMAL LOW (ref 13.0–17.0)
MCHC: 33.5 g/dL (ref 30.0–36.0)
MCHC: 34 g/dL (ref 30.0–36.0)
MCHC: 34.7 g/dL (ref 30.0–36.0)
MCV: 82.8 fL (ref 78.0–100.0)
MCV: 87.1 fL (ref 78.0–100.0)
MCV: 87.6 fL (ref 78.0–100.0)
MCV: 88 fL (ref 78.0–100.0)
MCV: 88.4 fL (ref 78.0–100.0)
MCV: 88.5 fL (ref 78.0–100.0)
MCV: 89.8 fL (ref 78.0–100.0)
Platelets: 389 10*3/uL (ref 150–400)
Platelets: 330 10*3/uL (ref 150–400)
Platelets: 338 10*3/uL (ref 150–400)
Platelets: 339 10*3/uL (ref 150–400)
Platelets: 353 10*3/uL (ref 150–400)
Platelets: 451 10*3/uL — ABNORMAL HIGH (ref 150–400)
RBC: 3.09 MIL/uL — ABNORMAL LOW (ref 4.22–5.81)
RBC: 2.33 MIL/uL — ABNORMAL LOW (ref 4.22–5.81)
RBC: 2.7 MIL/uL — ABNORMAL LOW (ref 4.22–5.81)
RBC: 2.84 MIL/uL — ABNORMAL LOW (ref 4.22–5.81)
RBC: 3.42 MIL/uL — ABNORMAL LOW (ref 4.22–5.81)
RDW: 17.6 % — ABNORMAL HIGH (ref 11.5–15.5)
RDW: 18.2 % — ABNORMAL HIGH (ref 11.5–15.5)
RDW: 18.9 % — ABNORMAL HIGH (ref 11.5–15.5)
WBC: 7.4 10*3/uL (ref 4.0–10.5)
WBC: 11.5 10*3/uL — ABNORMAL HIGH (ref 4.0–10.5)
WBC: 14.4 10*3/uL — ABNORMAL HIGH (ref 4.0–10.5)
WBC: 15.7 10*3/uL — ABNORMAL HIGH (ref 4.0–10.5)
WBC: 7.3 10*3/uL (ref 4.0–10.5)
WBC: 7.8 10*3/uL (ref 4.0–10.5)
WBC: 9.3 10*3/uL (ref 4.0–10.5)

## 2010-11-15 LAB — URINALYSIS, ROUTINE W REFLEX MICROSCOPIC
Bilirubin Urine: NEGATIVE
Nitrite: NEGATIVE
Specific Gravity, Urine: 1.01 (ref 1.005–1.030)
Urobilinogen, UA: 4 mg/dL — ABNORMAL HIGH (ref 0.0–1.0)
pH: 6 (ref 5.0–8.0)

## 2010-11-15 LAB — COMPREHENSIVE METABOLIC PANEL
ALT: 37 U/L (ref 0–53)
AST: 32 U/L (ref 0–37)
Albumin: 3.3 g/dL — ABNORMAL LOW (ref 3.5–5.2)
Alkaline Phosphatase: 67 U/L (ref 39–117)
Chloride: 101 mEq/L (ref 96–112)
GFR calc Af Amer: >60 mL/min (ref >60)
Potassium: 3.5 mEq/L (ref 3.5–5.1)
Sodium: 136 mEq/L (ref 135–145)
Total Bilirubin: 2 mg/dL — ABNORMAL HIGH (ref 0.3–1.2)
Total Protein: 7.5 g/dL (ref 6.0–8.3)

## 2010-11-15 LAB — CULTURE, BLOOD (ROUTINE X 2)
Culture  Setup Time: 201203271023
Culture: NO GROWTH
Culture: NO GROWTH

## 2010-11-15 LAB — CROSSMATCH: Antibody Screen: NEGATIVE

## 2010-11-15 LAB — URINE CULTURE
Colony Count: NO GROWTH
Special Requests: NEGATIVE

## 2010-11-15 LAB — RETICULOCYTES
RBC.: 3.09 MIL/uL — ABNORMAL LOW (ref 4.22–5.81)
RBC.: 2.74 MIL/uL — ABNORMAL LOW (ref 4.22–5.81)
RBC.: 3.44 MIL/uL — ABNORMAL LOW (ref 4.22–5.81)
Retic Count, Absolute: 132.9 10*3/uL (ref 19.0–186.0)
Retic Count, Absolute: 405.9 10*3/uL — ABNORMAL HIGH (ref 19.0–186.0)
Retic Ct Pct: 11.8 % — ABNORMAL HIGH (ref 0.4–3.1)
Retic Ct Pct: 5.9 % — ABNORMAL HIGH (ref 0.4–3.1)

## 2010-11-15 LAB — HEPATIC FUNCTION PANEL
ALT: 346 U/L — ABNORMAL HIGH (ref 0–53)
Indirect Bilirubin: 8.4 mg/dL — ABNORMAL HIGH (ref 0.3–0.9)
Total Protein: 7.7 g/dL (ref 6.0–8.3)

## 2010-11-15 LAB — LACTATE DEHYDROGENASE: LDH: 385 U/L — ABNORMAL HIGH (ref 94–250)

## 2010-11-15 LAB — LEAD, BLOOD: Lead-Whole Blood: 0.2 ug/dL (ref <10.0)

## 2010-11-16 ENCOUNTER — Inpatient Hospital Stay (HOSPITAL_COMMUNITY): Payer: Medicare Other

## 2010-11-16 LAB — COMPREHENSIVE METABOLIC PANEL
Alkaline Phosphatase: 197 U/L — ABNORMAL HIGH (ref 39–117)
BUN: 4 mg/dL — ABNORMAL LOW (ref 6–23)
Chloride: 102 mEq/L (ref 96–112)
Creatinine, Ser: 0.53 mg/dL (ref 0.4–1.5)
Glucose, Bld: 98 mg/dL (ref 70–99)
Potassium: 4.1 mEq/L (ref 3.5–5.1)
Total Bilirubin: 23.7 mg/dL (ref 0.3–1.2)

## 2010-11-16 LAB — PROTIME-INR: Prothrombin Time: 16 seconds — ABNORMAL HIGH (ref 11.6–15.2)

## 2010-11-16 LAB — CBC
HCT: 24.6 % — ABNORMAL LOW (ref 39.0–52.0)
Hemoglobin: 8.8 g/dL — ABNORMAL LOW (ref 13.0–17.0)
MCH: 29.4 pg (ref 26.0–34.0)
MCHC: 35.8 g/dL (ref 30.0–36.0)
RDW: 17.2 % — ABNORMAL HIGH (ref 11.5–15.5)

## 2010-11-16 LAB — ALPHA-1-ANTITRYPSIN: A-1 Antitrypsin, Ser: 158 mg/dL (ref 83–200)

## 2010-11-16 LAB — IGG: IgG (Immunoglobin G), Serum: 2390 mg/dL — ABNORMAL HIGH (ref 694–1618)

## 2010-11-16 NOTE — Consult Note (Signed)
  NAME:  [name], [name] NO.:  [id number]  MEDICAL RECORD NO.:  [id number]           PATIENT TYPE:  I  LOCATION:  1517                         FACILITY:  [facility]  PHYSICIAN:  Graylin Shiver, M.D.   DATE OF BIRTH:  Sep 23, 1980  DATE OF CONSULTATION:  11/11/2010 DATE OF DISCHARGE:                                CONSULTATION   REASON FOR CONSULTATION:  The patient is a 30 year old black male with a history of sickle cell disease who was admitted to the hospital with complaints of right upper quadrant abdominal pain, which had been going on for 3-4 days.  The pain would get worse after eating.  This was associated with some nausea and vomiting.  He was seen and evaluated and found to have elevated liver enzymes.  On March 26th, his bilirubin was 16.8, alkaline phosphatase 132, AST 325, ALT 349.  Serum lipase was normal.  Prothrombin time is 15.8.  Today, his total bilirubin is 14.8, AST 164, ALT 258.  He states that he rarely drinks alcohol.  He had an abdominal ultrasound done, which showed sludge and shadowing calculi in the gallbladder without evidence of acute cholecystitis, common bile duct was 6 mm.  A nuclear medicine HIDA scan showed marked tracer retention by the liver with no excretion into the biliary tree, consistent with severe cholestasis and hepatocellular disease.  The patient gives no history of liver disease.  His hepatitis profile is negative for types A, B or C.  ANA is negative. Ceruloplasmin level is normal.  MEDICATIONS: 1. Hydroxyurea 1000 mg b.i.d., which he has been on for several years. 2. PRN Percocet, only when he needs it for sickle cell crisis.  ALLERGIES:  None known.  SOCIAL HISTORY:  He rarely drinks alcohol, does not smoke.  REVIEW OF SYSTEMS:  Otherwise negative.  PHYSICAL EXAMINATION:  GENERAL:  He does not appear in any acute distress.  HEENT:  Sclerae are jaundiced.  HEART:  Regular rhythm, no murmurs.  LUNGS:  Clear.   ABDOMEN:  Bowel sounds are normal.  It is soft.  There is tenderness in the right upper quadrant, but no rebound, guarding, or hepatosplenomegaly  IMPRESSION:  A 30 year old male with sickle cell disease who was admitted with right upper quadrant pain and jaundice.  He has gallstones.  The etiology, however, of his jaundice is not exactly clear.  PLAN:  I would recommend that we do an MRCP since we still do not have any imaging of his common bile duct to see whether he might have passed a stone into the CBD, which could be causing his jaundice.  I would also obtain CMV antibody and Epstein-Barr antibody in addition to what has already been done since, at this point, we do not have an etiology for any hepatocellular disease.          ______________________________ Graylin Shiver, M.D.     SFG/MEDQ  D:  11/11/2010  T:  11/12/2010  Job:  [id number]  cc:   [facility]  [facility]  Electronically Signed by Herbert Moors MD on 11/16/2010 04:40:22 PM

## 2010-11-17 ENCOUNTER — Inpatient Hospital Stay (HOSPITAL_COMMUNITY): Payer: Medicare Other

## 2010-11-17 ENCOUNTER — Other Ambulatory Visit: Payer: Self-pay | Admitting: General Surgery

## 2010-11-17 LAB — AMYLASE: Amylase: 65 U/L (ref 0–105)

## 2010-11-17 LAB — COMPREHENSIVE METABOLIC PANEL
ALT: 302 U/L — ABNORMAL HIGH (ref 0–53)
AST: 144 U/L — ABNORMAL HIGH (ref 0–37)
Albumin: 3.3 g/dL — ABNORMAL LOW (ref 3.5–5.2)
Calcium: 9.2 mg/dL (ref 8.4–10.5)
GFR calc Af Amer: >60 mL/min (ref >60)
Sodium: 139 mEq/L (ref 135–145)
Total Protein: 7.5 g/dL (ref 6.0–8.3)

## 2010-11-17 LAB — SURGICAL PCR SCREEN: Staphylococcus aureus: NEGATIVE

## 2010-11-17 LAB — CBC
Platelets: 292 10*3/uL (ref 150–400)
RDW: 17.9 % — ABNORMAL HIGH (ref 11.5–15.5)
WBC: 7 10*3/uL (ref 4.0–10.5)

## 2010-11-18 LAB — COMPREHENSIVE METABOLIC PANEL
ALT: 279 U/L — ABNORMAL HIGH (ref 0–53)
AST: 127 U/L — ABNORMAL HIGH (ref 0–37)
CO2: 30 mEq/L (ref 19–32)
Chloride: 101 mEq/L (ref 96–112)
Creatinine, Ser: 0.71 mg/dL (ref 0.4–1.5)
GFR calc Af Amer: >60 mL/min (ref >60)
GFR calc non Af Amer: >60 mL/min (ref >60)
Total Bilirubin: 11 mg/dL — ABNORMAL HIGH (ref 0.3–1.2)

## 2010-11-18 LAB — CBC
Hemoglobin: 8.5 g/dL — ABNORMAL LOW (ref 13.0–17.0)
MCH: 29.5 pg (ref 26.0–34.0)
MCV: 84.4 fL (ref 78.0–100.0)
RBC: 2.88 MIL/uL — ABNORMAL LOW (ref 4.22–5.81)

## 2010-11-19 LAB — TYPE AND SCREEN
Antibody Screen: POSITIVE
DAT, IgG: NEGATIVE
Unit division: 0
Unit division: 0

## 2010-11-22 NOTE — Consult Note (Signed)
NAME:  [name], PAT NO.:  [id number]  MEDICAL RECORD NO.:  [id number]          PATIENT TYPE:  INP  LOCATION:                               FACILITY:  [facility]  PHYSICIAN:  Homero Fellers, MD   DATE OF BIRTH:  [birth year]  DATE OF CONSULTATION: DATE OF DISCHARGE:                                CONSULTATION   REFERRING PHYSICIAN:  Dr. Derrell Lolling.  PRIMARY CARE PHYSICIAN:  Dineen Kid. Reche Dixon, M.D.  CHIEF COMPLAINT:  Right upper quadrant pain and vomiting.  HISTORY OF PRESENT ILLNESS:  A 30 year old African American gentleman with known history of sickle cell disease, who presented with right upper quadrant pain which has been going on for the past 2 days accompanied with nausea and vomiting.  He vomited 3 times yesterday and described aching right upper quadrant abdominal pain worsened by food or drinking.  He also noticed increased yellowish discoloration of his thighs and very dark urine.  Denies any fever, cough, chest pain, shortness of breath, diarrhea, back pain or limb pain.  Evaluation in the emergency room showed normal white blood cell count, but reticulocytosis of 11.4 and hyperbilirubinemia of 16.8.  The patient has no prior history of gallbladder disease.  He has been seen by General Surgery and will be admitted to the surgical service.  We have been asked to evaluate the patient for possible sickle cell crisis and provide clearance before possible surgery.  PAST MEDICAL HISTORY:  Includes sickle cell disease, last crisis was about 6 weeks ago.  MEDICATION:  Hydroxyurea and Percocet.  ALLERGIES:  None.  SOCIAL HISTORY:  No smoking, alcohol, or drugs.  FAMILY HISTORY:  Positive for sickle cell trait in his mother.  A 10- point review of systems is negative except as above.  PHYSICAL EXAMINATION:  VITAL SIGNS:  Blood pressure is 89/49 to 105/69, pulse 67, respirations 18, temperature 97.9, O2 saturation 95%. GENERAL:  The patient is lying in bed,  comfortable, in no respiratory distress. HEENT:  Pallor.  He is jaundiced. NECK:  Supple.  No JVD. LUNGS:  Clear bilaterally to auscultation. HEART:  S1 and S2.  No murmurs, rubs or gallops. ABDOMEN:  Full, soft.  There is right upper quadrant tenderness and clinically positive Murphy's sign.  Bowel sounds present.  No masses. EXTREMITIES:  No edema, clubbing or cyanosis. NEUROLOGICAL:  No deficits.  LABORATORY DATA:  White count is 8.0, hemoglobin 9.7, platelet count is 620.  Lipase 18.  Sodium 135, potassium 4, BUN 4, creatinine 0.63, AST 325, ALT 349, alkaline phosphatase is 132, bilirubin 16.8.  Chest x-ray showed no acute cardiopulmonary process.  Abdominal ultrasound showed sludge and shadowing calculi within the gallbladder without definite evidence of acute cholecystitis.  There is upper normal CBD diameter of about 6 mm with sludge in the CBD, correlation with LFTs recommended.  ASSESSMENT:  This is a 30 year old gentleman admitted with; 1. Right upper quadrant pain and presence of gallstones and possible     stone or sludge in the common bile duct with mild dilatation.  This     might explain his symptomatology including the abnormal lab tests.  Cholecystitis should be definitely ruled out. 2. Sickle cell disease.  There is no definite tests to diagnose vaso-     occlusive crisis, but suggestive tests are positive in this patient     including reticulocytosis, hyperbilirubinemia, and mild anemia.  He     does not have leukocytosis.  Further tests that also may be helpful     and his other tests also may be helpful include LDH, haptoglobin,     and C-reactive protein.  Elevated LDH and C-reactive protein, and     low haptoglobin will further support a vaso-occlusive process.  For     now I think it is reasonable to presume that the patient is having     vaso-occlusive crisis and treat accordingly.  PLAN:  Gentle IV fluids, pain medication, antiemesis; get  direct bilirubin, LDH, haptoglobin, and C-reactive protein.  The patient may also benefit from an MRCP to further evaluate the common bile duct and look for presence of stones.  Thank you for consulting.  We will follow along with you.  My recommendation is to hold on possible surgery for now and treat medically and when his numbers improve including the reticulocyte and total bilirubin, surgery may be planned.     Homero Fellers, MD     FA/MEDQ  D:  11/09/2010  T:  11/09/2010  Job:  [id number]  Electronically Signed by Homero Fellers  on 11/22/2010 09:17:46 PM

## 2010-11-29 NOTE — Op Note (Signed)
NAME:  [name], [name]               ACCOUNT NO.:  [id number]  MEDICAL RECORD NO.:  [id number]           PATIENT TYPE:  I  LOCATION:  1517                         FACILITY:  [facility]  PHYSICIAN:  Petra Kuba, M.D.    DATE OF BIRTH:  [birth year]  DATE OF PROCEDURE:  11/16/2010 DATE OF DISCHARGE:                              OPERATIVE REPORT   PROCEDURES:  Endoscopic retrograde cholangiopancreatography, sphincterotomy and stone extraction.  INDICATIONS:  The patient with questionable obstructive jaundice, positive stone on MRCP, multiple gallstones, increased liver tests. Consent was signed after risks, benefits, methods, options thoroughly discussed prior to any sedation by both myself and my partner Dr. Bosie Clos over the last few days.  MEDICINES USED:  Per anesthesia using general.  DESCRIPTION OF PROCEDURE:  The side-viewing therapeutic video duodenoscope was inserted by indirect vision into the stomach and advanced through the antrum and pylorus into the duodenum.  A rather bulbous ampulla was brought into view.  Using the triple-lumen sphincterotome loaded with the Jagwire, deep selective cannulation was obtained on the first attempt.  The wire was advanced into the intrahepatics.  Dye was injected.  There were no PD injections throughout the procedure.  One stone in the distal duct was seen.  We went ahead and proceeded with a medium-sized sphincterotomy in a customary fashion until adequate biliary drainage was seen as well as when we could get the fully bowed sphincterotome easily in and out of the duct without resistance.  We then exchanged the sphincterotome for the adjustable 12 mm to 15 mm balloon and proceeded with multiple balloon pull-throughs.  On the first one, a small black stone was delivered.  The other subsequent balloon pull-throughs did not reveal any stone, sludge or debris and did pass readily through the patent sphincterotomy site without resistance.  The  wire did fall out twice and we are able to easily cannulate with the balloon catheter.  We did fill the cystic duct and the gallbladder was filled with multiple stones and occlusion cholangiogram was done x2, which did not show any residual filling defects.  After the balloon was pulled through one last time, the scope was removed.  The wire and the balloon were removed.  The patient tolerated the procedure well.  There was no obvious immediate complication.  ENDOSCOPIC DIAGNOSES: 1. Bulbous ampulla. 2. No PD injections throughout the procedure. 3. One small CBD stone status post medium sphincterotomy and removed     with the balloon inflated to 12 mm. 4. Patent cystic duct with increased multiple stones in the     gallbladder. 5. Negative occlusion cholangiogram and intrahepatics at the end of     the procedure.  PLAN:  We will observe for delayed complications; if none, proceed with lap chole with intraoperative cholangiogram to confirm no further stones and consider even a liver biopsy at the time of surgery to rule in intrahepatic cholestasis.  Happy to see back p.r.n.          ______________________________ Petra Kuba, M.D.     MEM/MEDQ  D:  11/16/2010  T:  11/16/2010  Job:  [id number]  cc:   Petra Kuba, M.D. Fax: [phone]  Shirley Friar, MD Fax: [phone]  Anselm Pancoast. Zachery Dakins, M.D. [address], [address] [place] [address]  Electronically Signed by Vida Rigger M.D. on 11/29/2010 12:47:24 PM

## 2010-11-29 NOTE — Op Note (Signed)
[name], [name]               ACCOUNT NO.:  [id number]  MEDICAL RECORD NO.:  [id number]           PATIENT TYPE:  I  LOCATION:  1517                         FACILITY:  [facility]  PHYSICIAN:  Anselm Pancoast. Denna Fryberger, M.D.DATE OF BIRTH:  Sep 05, 1980  DATE OF PROCEDURE:  11/17/2010 DATE OF DISCHARGE:                              OPERATIVE REPORT   PREOPERATIVE DIAGNOSES:  Chronic cholecystitis with stones, status post sphincterotomy and removal of distal common duct stone, history of sickle cell disease, anemia secondary to sickle cell disease.  POSTOPERATIVE DIAGNOSES:  Chronic cholecystitis with stones, status post sphincterotomy and removal of distal common duct stone, history of sickle cell disease, anemia secondary to sickle cell disease.  OPERATIONS:  Laparoscopic cholecystectomy with cholangiogram.  ANESTHESIA:  General.  SURGEON:  Anselm Pancoast. Zachery Dakins, MD  ASSISTANT:  Dr. Freida Busman.  HISTORY:  [name] is a 30 year old black male patient who has sickle cell disease and he was admitted by [place] approximately 10 days ago with abdominal pain.  He was seen in the ER, actually admitted, I think it was Dr. Gerrit Friends.  Ultrasound showed sludge.  Common bile duct was dilated 6 mm.  He was anemic, obviously known sickle cell disease, and a bilirubin was elevated and a consultation was requested by GI, Dr. Evette Cristal.  They saw him and thought that he probably did have a common duct stone.  Dr. Derrell Lolling was the physician who actually admitted him and this was on November 08, 2010.  His opinion was that he probably did have a common duct stone and worried about doing an ERCP.  The gastroenterologist continued to see him, thought it was sickle cell encephalopathy or  liver problem, did not want to proceed with ERCP. Then they did an MRI.  This really showed what was probably a stone in the distal common bile duct.  His bilirubin went up to about 20 while they were studying.  Dr. Gerrit Friends was the  surgeon of the week and he was thinking that the patient needed an ERCP.  Over the weekend, nothing was done.  He was seen by Dr. Bosie Clos and then on Monday Dr. Madilyn Fireman, and they finally agreed about doing ERCP, was not available and it was done yesterday.  The patient did have a stone in the distal common bile duct and his bilirubin has now dropped to 11.  His amylase was not elevated today and I recommended we proceed on with a lap gallbladder with cholangiogram.  The patient is in agreement.  He was given 3 g of Zosyn immediately preoperative.  He was taken to the operative suite, induction of general anesthesia, and the room had been warmed.  His hematocrit is 24, which is chronic and then after induction of general anesthesia and oral tube, the abdomen was prepped with Betadine solution and draped in a sterile manner.  A small incision was made below the umbilicus after the time- out, patient identified, etc., and then the camera was inserted after pursestring suture and the gallbladder was markedly distended, not really acutely inflamed, but not a chronic cholecystitis either.  There was a lot of bile  staining at the liver and also around the gallbladder and the gallbladder was very distended and we placed the upper 10 mm trocar through the subxiphoid area and then the 2 lateral 5 mm trocars laterally.  The liver was prominent and the retraction of the gallbladder upward, it was difficult for the retraction even though the ports were placed reasonably low.  The proximal portion of the gallbladder was grasped.  There was a stone sort of impacted in the gallbladder, cystic duct and junction.  The graspers that Dr. Freida Busman retracted, there was some bile spillage and that kind of made things a little more difficult.  The peritoneum was opened.  The cystic artery was identified and was doubly clipped, but not divided and then I was able to encompass the cystic duct around just proximal to  the stone and a clip placed pushing the clip back up into the actual gallbladder.  I then made a little opening, put in a Cook catheter.  The cystic duct was large and the catheter was held in place with a clip and then the x-ray obtained, which showed good flow of dye going into the duodenum.  You could see where he has had the sphincterotomy done and no evidence of any other stones and about a 2 cm cystic duct.  I then withdrew the catheter, triply clipped the cystic duct and then divided it.  There was a little artery, which we had clipped a little more distal that was bleeding and we put 2 clips on it, that actually was the artery going to the gallbladder and then we divided it distal to the 2 additional clips. The gallbladder was freed from its bed with the hook electrocautery and then switching the spatula.  The distal portion of the gallbladder was kind of intrahepatic.  There was a big kind of finger of liver over the gallbladder, it was sent up and removed with the actual gallbladder and we will have a liver biopsy to look at if there is any question about the etiology of his jaundice.  The liver bed was cauterized.  Hemostasis was okay.  A piece of snow was placed.  We did not think a drain was needed.  The gallbladder had been placed in an EndoCatch bag and then was brought out through the umbilicus.  The gallbladder bag ripped when Dr. Freida Busman was trying to remove it and fortunately the stones did not drop to the peritoneal cavity as best we could tell and opened the little fascia just a little larger and then could withdraw the gallbladder and the stones.  The patient tolerated the procedure satisfactorily.  We thoroughly irrigated the peritoneal cavity, could not see any dropped stones.  I put a figure-of-8 in the fascia with 0 Vicryl a little bit more proximal where we had extended the incision up under the umbilicus, then tied both with the pursestring and put 1 additional  suture.  I then anesthetized the fascia.  I reinspected the fossa.  No evidence of bleeding, no bile, no stones and then removed the lateral 5 mm port.  The upper 10 mm trocar was withdrawn last after releasing the CO2.  I put a simple suture of 0 Vicryl in the fascia there.  Since he is a very thin male, needle went directly into the peritoneal cavity.  The subcutaneous wounds were closed with  4-0 Vicryl and Benzoin and Steri-Strips on the skin.  The patient tolerated the procedure nicely and hopefully he will  be ready for discharge in the a.m.  I will keep him on antibiotics for the next couple of doses because of the bile spillage.     Anselm Pancoast. Zachery Dakins, M.D.     WJW/MEDQ  D:  11/17/2010  T:  11/18/2010  Job:  [id number]  cc:   Dr. Madilyn Fireman  Electronically Signed by Consuello Bossier M.D. on 11/29/2010 08:55:24 AM

## 2010-12-16 NOTE — Discharge Summary (Signed)
[name], [name]               ACCOUNT NO.:  [id number]  MEDICAL RECORD NO.:  [id number]           PATIENT TYPE:  I  LOCATION:  1517                         FACILITY:  [facility]  PHYSICIAN:  Anselm Pancoast. Ivalee Strauser, M.D.DATE OF BIRTH:  05-08-1981  DATE OF ADMISSION:  11/08/2010 DATE OF DISCHARGE:  11/20/2010                              DISCHARGE SUMMARY   ADMISSION DIAGNOSES: 1. Abdominal pain, gallstones, and jaundice. 2. Sickle cell disease. 3. Questionable active hemolysis.  DISCHARGE DIAGNOSES: 1. Obstructive jaundice with stones, found in the common bile duct on     ERCP. 2. Chronic cholecystitis with stones, status post ERCP and stone     removal of common bile duct. 3. Sickle cell anemia. 4. Chronic anemia secondary to his sickle cell disease.  PROCEDURES: 1. Abdominal ultrasound on November 09, 2010, showing sludge, shadowing     calculi within the gallbladder without definite evidence of     cholecystitis.  The upper normal common bile duct diameter is 6 mm     with sludge in the common bile duct. 2. HIDA scan on November 10, 2010.  This study was done in 2 parts.  The     first portion showed marked tracer retention by the liver with no     excretion into the biliary tree at 4 hours, consistent with severe     cholestasis or hepatocellular disease.  The patient was taken back     to Nuclear Medicine the following day, 20-hour delayed image, this     showed activity in the liver and urinary bladder as well as     activity in the bowel and gallbladder.  They were in agreement that     exam is indicative of hepatocellular dysfunction. 3. MRI of the abdomen without contrast.  This was performed on November 20, 2010.  Also, this showed persistent mild intrahepatic and     extrahepatic biliary dilatation with probable choledocholithiasis,     most notable in the common hepatic duct, cholelithiasis,     gallbladder sludge, and mild pericholecystic fluid.  No significant  pancreatic ductal abnormality.  No changes in the spleen or the     kidneys. 4. ERCP, November 16, 2010, Dr. Vida Rigger, this showed bulbous ampulla,     no PD injection throughout the procedure, 1 small stone, status     post median sphincterotomy, removed with the balloon inflated to 12     mm, patent cystic duct with increased multiple stones in the     gallbladder.  Negative occlusion cholangiogram and intrahepatics at     the end of the procedure. 5. Laparoscopic cholecystectomy with cholangiogram on November 17, 2010,     Dr. Zachery Dakins.  BRIEF HISTORY:  The patient is a 30 year old black male with a history of sickle cell and was admitted by [place] with abdominal pain.  He was seen in the ER.  His CBD was dilated to about 6 mm.  He was anemic and had known sickle cell disease.  He has had sickle cell crisis, but did not feel like this was his problem. HOSPITAL  COURSE:    He was seen in consultation after admission by Dr. Evette Cristal and they were in agreement that he most likely had a common bile duct stone.  MRCP did in fact confirm changes as noted above.  He ultimately underwent ERCP by Dr. Ewing Schlein as noted above.  He tolerated this procedure well and then underwent laparoscopic cholecystectomy by Dr. Zachery Dakins on November 20, 2010.  During his workup, his bilirubin continued to climb.  On admission, it was 15.3; by March 31st, it was up to 20.3; gotten  as high as 23.7 on November 16, 2010.  After the ERCP and the sphincterotomy, it came down to 11.7.  His transaminases also were elevated during his hospital course.  By April 5th, first postoperative day was doing better, but still felt tired rather puny as noted above is.  Total bilirubin was back down to 11, alk phos was 73, SGOT was 127, SGPT was 279.  Electrolytes were normal.  Glucose is 104.  White count is 8.1, hemoglobin 8.5, hematocrit was 24.3, platelets are 256,000. He stayed one additional night and was discharged the a.m. on November 19, 2010,  by Dr. Zachery Dakins.  He was discharged home on Vicodin 1-2 p.o. q.4 h. p.r.n., folic acid 1 mg daily, Zofran 4 mg p.r.n., and hydroxyurea 500 mg 2 capsules b.i.d.  Schedule a return followup 1 week Dr. Marlane Hatcher office and follow up in 2-3 weeks with Dr. Zachery Dakins.  CONDITION ON DISCHARGE:  Improved.     Eber Hong, P.A.   ______________________________ Anselm Pancoast. Zachery Dakins, M.D.    WDJ/MEDQ  D:  12/01/2010  T:  12/02/2010  Job:  [id number]  cc:   Melvern Banker Fax: [phone]  Petra Kuba, M.D. Fax: [phone]  Electronically Signed by Sherrie George P.A. on 12/02/2010 04:09:12 PM Electronically Signed by Consuello Bossier M.D. on 12/16/2010 11:47:10 AM

## 2010-12-28 NOTE — H&P (Signed)
[name], [name] NO.:  [id number]   MEDICAL RECORD NO.:  [id number]          PATIENT TYPE:  INP   LOCATION:  3041                         FACILITY:  [facility]   PHYSICIAN:  Della Goo, M.D. DATE OF BIRTH:  11/16/1980   DATE OF ADMISSION:  11/11/2007  DATE OF DISCHARGE:                              HISTORY & PHYSICAL   PRIMARY CARE PHYSICIAN:  Unassigned.   CHIEF COMPLAINT:  Severe back pain.   HISTORY OF PRESENT ILLNESS:  This is a 30 year old male with sickle cell  disease who presents with complaints of severe low back pain for the  past 24 hours.  He denies having any nausea, vomiting, chest pain,  shortness of breath.  He reports the pain has been uncontrollable with  his pain medications.  He has recently moved from [place] to [place].   PAST MEDICAL HISTORY:  Sickle cell disease.   PAST SURGICAL HISTORY:  None.   MEDICATIONS INCLUDE:  1. Dilaudid 4 mg p.o. q.4 hours p.r.n. severe pain.  2. Folic acid 1 mg one p.o. b.i.d.  3. Hydroxyurea 500 mg 2 tablets p.o. b.i.d.   ALLERGIES:  NO KNOWN DRUG ALLERGIES.   SOCIAL HISTORY:  The patient is a nonsmoker, nondrinker.   FAMILY HISTORY:  Noncontributory.   PHYSICAL EXAMINATION:  FINDINGS:  This is a pleasant 30 year old, well-  nourished, well-developed male in discomfort but no acute distress.  VITAL SIGNS:  Temperature 98.9, blood pressure 133/79 to 110/46, heart  rate 84-89, respirations 18-20, O2 saturation is 96-97% on room air.  HEENT EXAMINATION:  Normocephalic, atraumatic.  Pupils equally round,  reactive to light.  Extraocular muscles are intact.  Funduscopic benign.  Oropharynx is clear.  NECK:  Is supple, full range of motion.  No thyromegaly, adenopathy,  jugulovenous distention.  CARDIOVASCULAR:  Regular rate and rhythm.  No  murmurs, gallops or rubs.  LUNGS:  Are clear to auscultation bilaterally.  ABDOMEN:  Positive bowel sounds, soft, nontender, nondistended.  EXTREMITIES:  Without cyanosis, clubbing or edema.  BACK EXAMINATION:  No costovertebral angle tenderness.  NEUROLOGIC EXAMINATION:  Nonfocal.   LABORATORY STUDIES:  White blood cell count 21.9, hemoglobin 10.1,  hematocrit 29.2, MCV 91.8, platelets 561,000, neutrophils 75%,  lymphocytes 18%.  Urinalysis negative.  Chest x-ray reveals no active  cardiopulmonary disease.  Chemistry reveals a sodium of 137, potassium  3.7, chloride 103, carbon dioxide 23, BUN 6, creatinine 0.78, and  glucose 84.  Calcium 9.3, albumin 4.6, AST 32, ALT 19.     Urinalysis negative.   ASSESSMENT:  A 30 year old male being admitted with:  1. Sickle cell crisis.  2. Low back pain.  3. Anemia.  4. Leukocytosis.   PLAN:  The patient will be admitted for pain control and will be  administered IV fluids and continue on his regular medications  otherwise.  DVT and GI prophylaxis have also been ordered.  Further  therapies will ensue pending patient's symptoms and clinical course.      Della Goo, M.D.  Electronically Signed     HJ/MEDQ  D:  11/12/2007  T:  11/12/2007  Job:  [id number]

## 2010-12-28 NOTE — H&P (Signed)
NAMECONNER, [name]               ACCOUNT NO.:  [id number]   MEDICAL RECORD NO.:  [id number]          PATIENT TYPE:  INP   LOCATION:  1444                         [facility]ACILITY[facility]   PHYSICIAN:  Della Goo, M.D. DATE OF BIRTH:  01/31/1981   DATE OF ADMISSION:  03/05/2008  DATE OF DISCHARGE:                              HISTORY & PHYSICAL   PRIMARY CARE PHYSICIAN:  Unassigned.   CHIEF COMPLAINT:  Severe back pain.   HISTORY OF PRESENT ILLNESS:  This is a 30 year old male with sickle cell  disease presenting to the emergency department secondary to severe  unremitting left-sided lumbar and flank pain that started at about 11:00  a.m. in the morning.  He reports he had been working in his home,  cleaning an area that had flooded, and reports that his symptoms  continued to worsen and had been unrelieved with his pain medication of  Hydrocodone.  The patient feels that the area was moldy, and the  exposure to this mold possibly initiated and exacerbated his painful  symptoms.  He denies any shortness of breath or any chest pain.  He  reports the pain is 10/10.  He denies any dysuria, fevers, chills,  shortness of breath, chest pain as mentioned above.  He denies having  any nausea, vomiting, diarrhea or constipation.Marland Kitchen   PAST MEDICAL HISTORY:  History of sickle cell anemia.   PAST SURGICAL HISTORY:  None.   MEDICATIONS:  1. Hydroxyurea 500 mg 2 tablets p.o. b.i.d.  2. Folic acid 1 mg one p.o. daily.  3. Hydrocodone/APAP p.r.n.  4. Dilaudid tablets p.r.n.   SOCIAL HISTORY:  The patient is single.  He is a nonsmoker, nondrinker.   FAMILY HISTORY:  Noncontributory.   REVIEW OF SYSTEMS:  Pertinents are mentioned above.   PHYSICAL EXAMINATION FINDINGS:  GENERAL:  This is a 30 year old well-  nourished, well-developed male in discomfort but no acute distress.  VITAL SIGNS:  Temperature 98.2, blood pressure 126/62, heart rate 90,  respirations 18, O2 saturations 98-100%.  HEENT:  Normocephalic, atraumatic.  Pupils equally round, reactive to  light.  Extraocular movements are intact.  Funduscopic benign.  Oropharynx is clear.  NECK:  Supple, full range of motion.  No thyromegaly, adenopathy or  jugular venous distention.  CARDIOVASCULAR:  Regular rate and rhythm.  No murmurs, gallops or rubs.  LUNGS:  Clear to auscultation bilaterally.  ABDOMEN:  Positive bowel sounds, soft, nontender, nondistended.  EXTREMITIES:  Without cyanosis, clubbing or edema.  BACK:  Without costovertebral angle tenderness.  No spinous process  tenderness.  No palpable muscle spasms.  NEUROLOGIC:  Nonfocal.   LABORATORY STUDIES:  White blood cell count 19.9, hemoglobin 9.0,  hematocrit 26.1, platelets 376, neutrophils 75%, lymphocytes 18%.  Sodium 137, potassium 4.0, chloride 103, bicarbonate 26, BUN 7,  creatinine 0.76 and glucose 103.  Albumin 4.6, AST 41, ALT 18,  reticulocyte count 7.4%, red blood cells 2.92 and absolute reticulocyte  count 216.1.   ASSESSMENT:  A 30 year old male being admitted with:  1. Sickle cell crisis.  2. Flank/back lumbar area pain secondary to #1.  3. Stress  leukocytosis secondary to #1 and #2.  4. Anemia secondary #1.   PLAN:  The patient will be admitted to a telemetry area for cardiac  monitoring.  The patient will be placed on IV fluids for rehydration  therapy.  Pain control therapy has been ordered along with antiemetics  as needed.  The patient's CBC and electrolytes will be monitored.  He  will continue on his regular medications, and DVT and GI prophylaxis  will be ordered.  Muscle relaxant therapy will also be ordered with  Flexeril p.r.n., and Toradol will also be added since this pain may have  been caused by muscle strain from over exertion.      Della Goo, M.D.  Electronically Signed     HJ/MEDQ  D:  03/05/2008  T:  03/05/2008  Job:  [id number]

## 2010-12-28 NOTE — Discharge Summary (Signed)
[name], [name]               ACCOUNT NO.:  [id number]   MEDICAL RECORD NO.:  [id number]          PATIENT TYPE:  INP   LOCATION:  3041                         FACILITY:  [facility]   PHYSICIAN:  Wilson Singer, M.D.DATE OF BIRTH:  1980-08-29   DATE OF ADMISSION:  11/12/2007  DATE OF DISCHARGE:  11/13/2007                               DISCHARGE SUMMARY   FINAL DISCHARGE DIAGNOSIS:  Sickle cell painful crisis, clinically  resolved.   CONDITION ON DISCHARGE:  Stable.   DISCHARGE MEDICATIONS:  1. Dilaudid 4 mg b.i.d. p.r.n.  2. Folic acid 1 mg daily.  3. Hydroxyurea 2 tablets morning and 2 tablets nightly.   HISTORY:  This 30 year old man was admitted with a typical sickle cell  painful crisis with pain in the back.  Please see initial history and  physical examination done by Dr. Della Goo.   HOSPITAL COURSE:  The patient was treated in the usual fashion with  intravenous fluids __________  some oxygen.  This led to improvement  especially of the pain that he came in with, which was in the lower  back.  His hemoglobin was stable at 8.5, and on the day of discharge,  the hemoglobin was 8.3, white blood cell count 11.6, and platelets of  453.  He looked well.  His vital signs were stable without fever and  saturation was 95% on room air.   On further discussion, the patient was able to be discharged home on  p.o. Dilaudid that he normally takes and he will follow up with his  primary care physician.      Wilson Singer, M.D.  Electronically Signed     NCG/MEDQ  D:  12/24/2007  T:  12/25/2007  Job:  [id number]

## 2010-12-28 NOTE — Discharge Summary (Signed)
[name], STUMP               ACCOUNT NO.:  [id number]   MEDICAL RECORD NO.:  [id number]          PATIENT TYPE:  INP   LOCATION:  1444                         FACILITY:  [facility]   PHYSICIAN:  Lonia Blood, M.D.DATE OF BIRTH:  Mar 19, 1981   DATE OF ADMISSION:  03/04/2008  DATE OF DISCHARGE:  03/10/2008                               DISCHARGE SUMMARY   PRIMARY CARE PHYSICIAN:  Unassigned.   DISCHARGE DIAGNOSES:  1. Vaso-occlusive pain crisis.  2. Sickle cell anemia.  3. Left lower lobe community-acquired pneumonia.   DISCHARGE MEDICATIONS:  1. Hydrea 500 mg 2 tablets p.o. b.i.d.  2. Folic acid 1 mg p.o. daily.  3. Dilaudid 4 mg p.o. q.4 h p.r.n. #30 given.  4. Septra DS one p.o. b.i.d. x7 days then stop.   FOLLOW UP:  Follow-up is to be arranged clinical social worker prior to  the patient's discharge.  The patient is advised to keep close follow-up  with his newly assigned primary care physician for ongoing management of  his sickle cell disease.   PROCEDURES:  None.   CONSULTATIONS:  None.   HOSPITAL COURSE:  [name] is a very pleasant 30 year old  gentleman with a known history of sickle cell disease.  He has suffered  vaso-occlusive pain crises in the past.  He states that his pain is  usually located in the hips and the low back.  On presentation to the  emergency room, he was complaining of pain in the low back as well as  the right hip area.  The patient was admitted to the acute units.  He  did in fact appear clinically to be suffering with an acute vaso-  occlusive pain crisis.  He was hydrated aggressively.  Hemoglobin was  followed closely.  His hemoglobin reached a nadir of approximately 7.3.  He was transfused 1 unit of packed red blood cells and responded with an  increased hemoglobin to 8.2.  He did not require further transfusion.  His ferritin was level was accomplished and was approximately 250 and  therefore the patient did not require  chelation therapy.  During the  course the patient's hospitalization, after hydration and with clinical  improvement, the patient began to complain of left-sided pleuritic-type  chest pain.  There was no clinical decompensation to suggest a true  chest syndrome.  The patient did receive a chest x-ray.  This suggested  a possible left lower lobe infiltrate.  The patient was placed on  empiric broad-spectrum IV antibiotics.  He tolerated this without  difficulty.  Follow-up chest x-rays in fact suggested improvement of the  early infiltrate.  Clinically, the patient's symptoms stabilized.  He  was suffering with some mild pleuritic-type chest pain but no severe  shortness of breath or wheeze.  The patient was able to be liberated  from IV fluid without deleterious effects on his pain control.  He was  able to tolerate a regular diet.  O2 sats were stable and vital signs  were stable.  On March 10, 2008, the patient was clinically stable for  discharge.  Clinical social  work/case manager was consulted to help the patient locate a primary  care physician in the area.  He is provided with the appropriate  prescriptions and a 30-day supply with close follow-up with his new  primary care physician strongly encouraged.      Lonia Blood, M.D.  Electronically Signed     JTM/MEDQ  D:  03/10/2008  T:  03/10/2008  Job:  [id number]

## 2010-12-28 NOTE — H&P (Signed)
[name], BERKEL               ACCOUNT NO.:  [id number]   MEDICAL RECORD NO.:  [id number]          PATIENT TYPE:  INP   LOCATION:  1407                         FACILITY:  [facility]   PHYSICIAN:  Wilson Singer, M.D.DATE OF BIRTH:  1981-06-15   DATE OF ADMISSION:  09/12/2007  DATE OF DISCHARGE:                              HISTORY & PHYSICAL   The patient is unassigned.   HISTORY:  This is a 30 year old man who has come from [place] and who  has a history of sickle cell disease.  He presents now with a 3-4 day  history of aching and pain especially around his chest which appears to  be pleuritic in nature.  He has had no cough or fever.  He tends to get  sickle cell crises every few months. He usually takes Dilaudid but he  was not able to avert his symptoms on this occasion.   PAST SURGICAL HISTORY:  None.   PAST MEDICAL HISTORY:  Sickle cell disease.   SOCIAL HISTORY:  He is single and is from [place]. He is currently  living with his mother.  He does not smoke and does not drink alcohol.  He is currently unemployed.   MEDICATIONS:  Dilaudid 4 mg p.r.n., folic acid 1 mg daily, hydroxyurea  500 mg t.i.d.   ALLERGIES:  None.   FAMILY HISTORY:  Noncontributory.   REVIEW OF SYSTEMS:  A 12-point review of systems was unremarkable apart  from the symptoms mentioned above.   PHYSICAL EXAMINATION:  Temperature 100.4, blood pressure 116/60, pulse  97, respiratory rate 14-16, saturation 94%.  He is not in any  respiratory distress.  CARDIOVASCULAR:  Heart sounds are present and normal without murmurs.  RESPIRATORY:  Lung fields are actually clear with a with no pleural rub  or crackles heard.  There are no wheezes.  ABDOMEN:  Soft, nontender with no hepatosplenomegaly.  NEUROLOGICAL:  Alert and oriented with no focal neurologic signs.   INVESTIGATIONS:  Chest x-ray appears to show left lower lobe airspace  disease with possible bilateral pleural effusions, greater on  the left  than the right.  To my eye, it is not clear about these findings  Sodium  133, potassium 4.2, bicarbonate 31, BUN 6, creatinine 0.72, hemoglobin  8.8, white blood cell count 18.8, platelets 236.  Reticulocyte count  6.5% elevated.   IMPRESSION:  1. Sickle cell crisis.  2. Pneumonia versus possible pulmonary infarction based on symptoms      and x-ray findings.   PLAN:  1. Admit.  2. Intravenous fluids, oxygen, analgesics.  3. Intravenous Avelox as empirical choice for possible pneumonia.  4. CT chest scan for further evaluation of infiltrates seen on chest x-      ray.  Further recommendations will depend on the patient's hospital progress.      Wilson Singer, M.D.  Electronically Signed     NCG/MEDQ  D:  09/12/2007  T:  09/13/2007  Job:  [id number]

## 2010-12-31 NOTE — Discharge Summary (Signed)
[name], [name]               ACCOUNT NO.:  [id number]   MEDICAL RECORD NO.:  [id number]          PATIENT TYPE:  INP   LOCATION:  1407                         FACILITY:  [facility]   PHYSICIAN:  Wilson Singer, M.D.DATE OF BIRTH:  November 13, 1980   DATE OF ADMISSION:  09/12/2007  DATE OF DISCHARGE:  09/15/2007                               DISCHARGE SUMMARY   FINAL DISCHARGE DIAGNOSES:  1. Sickle cell crisis.  2. Anemia status post one unit blood transfusion.  3. Pneumonia.   CONDITION ON DISCHARGE:  Stable.   MEDICATIONS ON DISCHARGE:  1. Folic acid 1 mg daily.  2. Hydroxyurea, continue home dose.  3. Dilaudid, continue home dose.  4. Avelox 400 mg daily for five further days.   HISTORY:  This 30 year old man was admitted for 3-4 day history of  aching pain especially around his chest which was pleuritic in nature.  Please see initial history and physical examination done by Dr. Lilly Cove.   HOSPITAL PROGRESS:  He was admitted and treated properly with  intravenous fluids and analgesics and a chest x-ray showed a possible  left lower lobe pneumonia.  He was treated with antibiotics.  He did  well with conservative measures, and on the day of discharge, he was  pain free.  On physical examination, temperature 98.6, blood pressure  100, 412/68, pulse 90, saturation 93% room air.  Lung fields actually  clear.  Investigations showed a hemoglobin of 8.3, white blood cell  count 12.8, platelets 314, sodium 136, potassium 4.4, bicarbonate 29,  BUN 4, creatinine 0.71.   FURTHER DISPOSITION:  He was allowed to be discharged home and will  follow up with his primary care physician in due course.  I would think  he needs a repeat chest x-ray to follow-up his pneumonia.      Wilson Singer, M.D.  Electronically Signed     NCG/MEDQ  D:  10/03/2007  T:  10/04/2007  Job:  [id number]

## 2011-05-05 LAB — BASIC METABOLIC PANEL
CO2: 29
CO2: 30
CO2: 31
Calcium: 8.6
Calcium: 9.1
Chloride: 98
GFR calc Af Amer: >60
GFR calc non Af Amer: >60
Glucose, Bld: 109 — ABNORMAL HIGH
Glucose, Bld: 114 — ABNORMAL HIGH
Potassium: 4.2
Potassium: 4.4
Sodium: 133 — ABNORMAL LOW
Sodium: 135
Sodium: 136

## 2011-05-05 LAB — CBC
HCT: 20.4 — ABNORMAL LOW
HCT: 23.4 — ABNORMAL LOW
HCT: 25.7 — ABNORMAL LOW
Hemoglobin: 7.7 — CL
Hemoglobin: 8.3 — ABNORMAL LOW
Hemoglobin: 8.8 — ABNORMAL LOW
MCHC: 34.3
MCHC: 34.4
MCHC: 35.6
MCV: 90.2
MCV: 91.5
RBC: 2.23 — ABNORMAL LOW
RBC: 2.42 — ABNORMAL LOW
RBC: 2.59 — ABNORMAL LOW
RDW: 18 — ABNORMAL HIGH
RDW: 19.3 — ABNORMAL HIGH
WBC: 12.1 — ABNORMAL HIGH
WBC: 14.8 — ABNORMAL HIGH

## 2011-05-05 LAB — CROSSMATCH

## 2011-05-05 LAB — ABO/RH: ABO/RH(D): O POS

## 2011-05-05 LAB — COMPREHENSIVE METABOLIC PANEL
AST: 39 — ABNORMAL HIGH
Alkaline Phosphatase: 77
BUN: 5 — ABNORMAL LOW
CO2: 27
Chloride: 98
Creatinine, Ser: 0.68
GFR calc non Af Amer: >60
Total Bilirubin: 2.3 — ABNORMAL HIGH

## 2011-05-05 LAB — DIFFERENTIAL
Basophils Relative: 0
Eosinophils Absolute: 0.2
Lymphs Abs: 1.1
Monocytes Absolute: 0.2
Neutro Abs: 17.3 — ABNORMAL HIGH

## 2011-05-06 LAB — I-STAT 8, (EC8 V) (CONVERTED LAB)
BUN: 14
Bicarbonate: 27.9 — ABNORMAL HIGH
Chloride: 105
HCT: 30 — ABNORMAL LOW
Operator id: 272551
pCO2, Ven: 38.9 — ABNORMAL LOW
pH, Ven: 7.465 — ABNORMAL HIGH

## 2011-05-06 LAB — DIFFERENTIAL
Basophils Absolute: 0.2 — ABNORMAL HIGH
Eosinophils Absolute: 0
Metamyelocytes Relative: 0
Myelocytes: 0
Neutro Abs: 7.7
Neutrophils Relative %: 42 — ABNORMAL LOW
Promyelocytes Absolute: 0
nRBC: 1 — ABNORMAL HIGH

## 2011-05-06 LAB — RETICULOCYTES
RBC.: 2.94 — ABNORMAL LOW
Retic Count, Absolute: 267.5 — ABNORMAL HIGH
Retic Ct Pct: 9.1 — ABNORMAL HIGH

## 2011-05-06 LAB — CBC
MCHC: 34.6
MCV: 92.3
RDW: 21.8 — ABNORMAL HIGH

## 2011-05-06 LAB — POCT I-STAT CREATININE: Creatinine, Ser: 1.8 — ABNORMAL HIGH

## 2011-05-09 LAB — CBC
HCT: 23.7 — ABNORMAL LOW
HCT: 29.2 — ABNORMAL LOW
Hemoglobin: 10.1 — ABNORMAL LOW
Hemoglobin: 8.3 — ABNORMAL LOW
MCV: 92
MCV: 93.6
Platelets: 453 — ABNORMAL HIGH
Platelets: 561 — ABNORMAL HIGH
RBC: 2.65 — ABNORMAL LOW
RBC: 3.18 — ABNORMAL LOW
RDW: 21.7 — ABNORMAL HIGH
WBC: 11.6 — ABNORMAL HIGH
WBC: 15.5 — ABNORMAL HIGH
WBC: 21.9 — ABNORMAL HIGH

## 2011-05-09 LAB — COMPREHENSIVE METABOLIC PANEL
ALT: 19
Albumin: 4.6
Alkaline Phosphatase: 66
Alkaline Phosphatase: 85
BUN: 2 — ABNORMAL LOW
CO2: 29
Chloride: 102
Creatinine, Ser: 0.68
GFR calc Af Amer: >60
GFR calc non Af Amer: >60
Glucose, Bld: 111 — ABNORMAL HIGH
Potassium: 3.7
Potassium: 4.1
Sodium: 137
Total Bilirubin: 4.5 — ABNORMAL HIGH
Total Protein: 8.2

## 2011-05-09 LAB — URINALYSIS, ROUTINE W REFLEX MICROSCOPIC
Glucose, UA: NEGATIVE
Hgb urine dipstick: NEGATIVE
Specific Gravity, Urine: 1.012
Urobilinogen, UA: 2 — ABNORMAL HIGH

## 2011-05-09 LAB — RETICULOCYTES: RBC.: 3.21 — ABNORMAL LOW

## 2011-05-09 LAB — DIFFERENTIAL
Basophils Relative: 0
Eosinophils Relative: 1
Lymphs Abs: 3.9
Monocytes Absolute: 1.3 — ABNORMAL HIGH

## 2011-05-11 LAB — DIFFERENTIAL
Basophils Absolute: 0.2 — ABNORMAL HIGH
Basophils Relative: 1
Eosinophils Absolute: 0.4
Eosinophils Relative: 2
Lymphocytes Relative: 30
Lymphs Abs: 5.8 — ABNORMAL HIGH
Monocytes Absolute: 0.8
Monocytes Relative: 4
Neutro Abs: 12.2 — ABNORMAL HIGH
Neutrophils Relative %: 63

## 2011-05-11 LAB — BASIC METABOLIC PANEL WITH GFR
BUN: 7
Chloride: 102
Creatinine, Ser: 0.9
GFR calc Af Amer: >60
GFR calc non Af Amer: >60
Potassium: 3.7

## 2011-05-11 LAB — CBC
HCT: 27.4 — ABNORMAL LOW
Hemoglobin: 9.3 — ABNORMAL LOW
MCHC: 34
MCV: 90.5
Platelets: 532 — ABNORMAL HIGH
RBC: 3.03 — ABNORMAL LOW
RDW: 23.1 — ABNORMAL HIGH
WBC: 19.4 — ABNORMAL HIGH

## 2011-05-11 LAB — URINALYSIS, ROUTINE W REFLEX MICROSCOPIC
Bilirubin Urine: NEGATIVE
Glucose, UA: NEGATIVE
Hgb urine dipstick: NEGATIVE
Ketones, ur: NEGATIVE
Nitrite: NEGATIVE
Protein, ur: NEGATIVE
Specific Gravity, Urine: 1.01
Urobilinogen, UA: 1
pH: 7

## 2011-05-11 LAB — RETICULOCYTES
RBC.: 3 — ABNORMAL LOW
Retic Count, Absolute: 396 — ABNORMAL HIGH
Retic Ct Pct: 13.2 — ABNORMAL HIGH

## 2011-05-11 LAB — BASIC METABOLIC PANEL
CO2: 27
Calcium: 9.2
Glucose, Bld: 100 — ABNORMAL HIGH
Sodium: 137

## 2011-05-13 LAB — CROSSMATCH
ABO/RH(D): O POS
Antibody Screen: POSITIVE
DAT, IgG: NEGATIVE

## 2011-05-13 LAB — DIFFERENTIAL
Basophils Absolute: 0.2 — ABNORMAL HIGH
Basophils Relative: 0
Eosinophils Absolute: 0.2
Eosinophils Relative: 0
Lymphocytes Relative: 18
Lymphocytes Relative: 6 — ABNORMAL LOW
Lymphs Abs: 3.6
Monocytes Relative: 1 — ABNORMAL LOW
Monocytes Relative: 5
Neutrophils Relative %: 93 — ABNORMAL HIGH

## 2011-05-13 LAB — COMPREHENSIVE METABOLIC PANEL
Albumin: 3.5
Alkaline Phosphatase: 65
BUN: 6
CO2: 26
CO2: 29
Calcium: 9.2
Chloride: 101
Creatinine, Ser: 0.76
GFR calc non Af Amer: >60
GFR calc non Af Amer: >60
Glucose, Bld: 103 — ABNORMAL HIGH
Glucose, Bld: 95
Potassium: 3.8
Total Bilirubin: 3.5 — ABNORMAL HIGH

## 2011-05-13 LAB — CBC
HCT: 21.1 — ABNORMAL LOW
HCT: 23.5 — ABNORMAL LOW
HCT: 26.1 — ABNORMAL LOW
Hemoglobin: 7.9 — CL
Hemoglobin: 8 — ABNORMAL LOW
Hemoglobin: 8.1 — ABNORMAL LOW
Hemoglobin: 9 — ABNORMAL LOW
MCHC: 34
MCHC: 34.4
MCHC: 34.5
MCV: 87.8
MCV: 87.8
MCV: 88.8
MCV: 88.9
Platelets: 303
Platelets: 322
RBC: 2.38 — ABNORMAL LOW
RBC: 2.6 — ABNORMAL LOW
RBC: 2.61 — ABNORMAL LOW
RBC: 2.66 — ABNORMAL LOW
RBC: 2.67 — ABNORMAL LOW
RBC: 2.68 — ABNORMAL LOW
RBC: 2.98 — ABNORMAL LOW
WBC: 11.5 — ABNORMAL HIGH
WBC: 12.5 — ABNORMAL HIGH
WBC: 22 — ABNORMAL HIGH
WBC: 7.9
WBC: 9

## 2011-05-13 LAB — BASIC METABOLIC PANEL
BUN: 3 — ABNORMAL LOW
BUN: 4 — ABNORMAL LOW
CO2: 28
Calcium: 8.9
Chloride: 101
Creatinine, Ser: 0.61
Creatinine, Ser: 0.7
GFR calc Af Amer: >60
GFR calc Af Amer: >60
GFR calc Af Amer: >60
GFR calc non Af Amer: >60
GFR calc non Af Amer: >60
GFR calc non Af Amer: >60
Potassium: 3.4 — ABNORMAL LOW
Sodium: 139

## 2011-05-13 LAB — URINALYSIS, ROUTINE W REFLEX MICROSCOPIC
Bilirubin Urine: NEGATIVE
Hgb urine dipstick: NEGATIVE
Ketones, ur: NEGATIVE
Nitrite: NEGATIVE
pH: 5.5

## 2011-05-13 LAB — RETICULOCYTES
RBC.: 2.92 — ABNORMAL LOW
Retic Count, Absolute: 216.1 — ABNORMAL HIGH

## 2011-05-13 LAB — FERRITIN: Ferritin: 278 (ref 22–322)

## 2011-05-13 LAB — PHOSPHORUS: Phosphorus: 3.8

## 2011-05-13 LAB — URINE CULTURE: Colony Count: NO GROWTH

## 2011-05-21 ENCOUNTER — Inpatient Hospital Stay (HOSPITAL_COMMUNITY)
Admission: EM | Admit: 2011-05-21 | Discharge: 2011-05-26 | DRG: 812 | Disposition: A | Discharge status: Home / Self Care | Payer: Medicare Other | Attending: Internal Medicine | Admitting: Internal Medicine

## 2011-05-21 DIAGNOSIS — J329 Chronic sinusitis, unspecified: Secondary | ICD-10-CM | POA: Diagnosis present

## 2011-05-21 DIAGNOSIS — M25519 Pain in unspecified shoulder: Secondary | ICD-10-CM | POA: Diagnosis present

## 2011-05-21 DIAGNOSIS — M545 Low back pain, unspecified: Secondary | ICD-10-CM | POA: Diagnosis present

## 2011-05-21 DIAGNOSIS — D5700 Hb-SS disease with crisis, unspecified: Principal | ICD-10-CM | POA: Diagnosis present

## 2011-05-21 LAB — RETICULOCYTES
RBC.: 2.96 MIL/uL — ABNORMAL LOW (ref 4.22–5.81)
Retic Count, Absolute: 429.2 10*3/uL — ABNORMAL HIGH (ref 19.0–186.0)
Retic Ct Pct: 14.5 % — ABNORMAL HIGH (ref 0.4–3.1)

## 2011-05-21 LAB — CBC
HCT: 24.6 % — ABNORMAL LOW (ref 39.0–52.0)
Hemoglobin: 8.9 g/dL — ABNORMAL LOW (ref 13.0–17.0)
MCHC: 36.2 g/dL — ABNORMAL HIGH (ref 30.0–36.0)
RBC: 2.96 MIL/uL — ABNORMAL LOW (ref 4.22–5.81)
WBC: 16.3 10*3/uL — ABNORMAL HIGH (ref 4.0–10.5)

## 2011-05-21 LAB — DIFFERENTIAL
Basophils Relative: 0 % (ref 0–1)
Eosinophils Absolute: 0.2 10*3/uL (ref 0.0–0.7)
Eosinophils Relative: 1 % (ref 0–5)
Lymphocytes Relative: 24 % (ref 12–46)
Neutro Abs: 10.9 10*3/uL — ABNORMAL HIGH (ref 1.7–7.7)

## 2011-05-21 LAB — BASIC METABOLIC PANEL
Chloride: 100 mEq/L (ref 96–112)
GFR calc Af Amer: >90 mL/min (ref >90)
GFR calc non Af Amer: >90 mL/min (ref >90)
Potassium: 4.1 mEq/L (ref 3.5–5.1)
Sodium: 137 mEq/L (ref 135–145)

## 2011-05-21 LAB — TYPE AND SCREEN
ABO/RH(D): O POS
Antibody Screen: NEGATIVE

## 2011-05-22 LAB — BASIC METABOLIC PANEL
CO2: 27 mEq/L (ref 19–32)
Calcium: 8.9 mg/dL (ref 8.4–10.5)
Chloride: 103 mEq/L (ref 96–112)
Creatinine, Ser: 0.56 mg/dL (ref 0.50–1.35)
GFR calc Af Amer: >90 mL/min (ref >90)
Sodium: 137 mEq/L (ref 135–145)

## 2011-05-22 LAB — DIFFERENTIAL
Basophils Relative: 0 % (ref 0–1)
Eosinophils Absolute: 0.1 10*3/uL (ref 0.0–0.7)
Eosinophils Relative: 1 % (ref 0–5)
Lymphs Abs: 1.8 10*3/uL (ref 0.7–4.0)
Neutrophils Relative %: 73 % (ref 43–77)

## 2011-05-22 LAB — CBC
MCV: 82.6 fL (ref 78.0–100.0)
Platelets: 577 10*3/uL — ABNORMAL HIGH (ref 150–400)
RBC: 2.7 MIL/uL — ABNORMAL LOW (ref 4.22–5.81)
RDW: 20 % — ABNORMAL HIGH (ref 11.5–15.5)
WBC: 12.3 10*3/uL — ABNORMAL HIGH (ref 4.0–10.5)

## 2011-05-23 ENCOUNTER — Inpatient Hospital Stay (HOSPITAL_COMMUNITY): Payer: Medicare Other

## 2011-05-23 LAB — DIFFERENTIAL
Basophils Relative: 0 % (ref 0–1)
Eosinophils Absolute: 0.2 10*3/uL (ref 0.0–0.7)
Lymphs Abs: 2.8 10*3/uL (ref 0.7–4.0)
Neutro Abs: 7.9 10*3/uL — ABNORMAL HIGH (ref 1.7–7.7)
Neutrophils Relative %: 65 % (ref 43–77)

## 2011-05-23 LAB — COMPREHENSIVE METABOLIC PANEL
ALT: 34 U/L (ref 0–53)
AST: 41 U/L — ABNORMAL HIGH (ref 0–37)
Alkaline Phosphatase: 75 U/L (ref 39–117)
CO2: 29 mEq/L (ref 19–32)
Chloride: 99 mEq/L (ref 96–112)
GFR calc non Af Amer: >90 mL/min (ref >90)
Glucose, Bld: 98 mg/dL (ref 70–99)
Potassium: 4 mEq/L (ref 3.5–5.1)
Sodium: 134 mEq/L — ABNORMAL LOW (ref 135–145)
Total Bilirubin: 3.1 mg/dL — ABNORMAL HIGH (ref 0.3–1.2)

## 2011-05-23 LAB — CBC
Hemoglobin: 8.2 g/dL — ABNORMAL LOW (ref 13.0–17.0)
Platelets: 554 10*3/uL — ABNORMAL HIGH (ref 150–400)
RBC: 2.73 MIL/uL — ABNORMAL LOW (ref 4.22–5.81)
WBC: 12.1 10*3/uL — ABNORMAL HIGH (ref 4.0–10.5)

## 2011-05-23 NOTE — H&P (Signed)
NAMEORIAN, FIGUEIRA NO.:  [id number]  MEDICAL RECORD NO.:  [id number]  LOCATION:  WLED                         FACILITY:  [facility]  PHYSICIAN:  Della Goo, M.D. DATE OF BIRTH:  21-Apr-1981  DATE OF ADMISSION:  05/21/2011 DATE OF DISCHARGE:                             HISTORY & PHYSICAL   DATE OF ADMISSION:  May 21, 2011.  PRIMARY CARE PHYSICIAN:  Dr. Clelia Croft at [facility].  CHIEF COMPLAINT:  Shoulder and low back pain.  HISTORY OF PRESENT ILLNESS:  This is a 30 year old male with a history of sickle cell anemia who presents to the emergency department with complaints of worsening severe low back pain and left shoulder pain along with headache which started in the a.m. shortly after awakening. Patient describes having 9/10 throbbing pain.  His pain was unrelieved by his home pain medication regimen.  He denies having any fevers, chills, nausea, vomiting, chest pain, or shortness of breath. He also  denies having any constipation or diarrhea.  Patient was seen in the emergency department and evaluated.  He was found to have an elevated reticulocyte count and reticulocyte indices. Patient was given pain medications without relief and referred to the hospitalist service for admission.  PAST MEDICAL HISTORY:  Significant for sickle cell anemia.  PAST SURGICAL HISTORY:  History of a cholecystectomy.  MEDICATIONS:  At this time include hydroxyurea, folic acid, and Percocet p.r.n.  ALLERGIES:  No known drug allergies.  SOCIAL HISTORY:  Patient is a nonsmoker, nondrinker.  No history of illicit drug usage.  FAMILY HISTORY:  Mother has rheumatoid arthritis and maternal grandmother had a type of cancer, and both parents obviously have sickle cell trait.  REVIEW OF SYSTEMS:  Pertinent as mentioned above.  PHYSICAL EXAMINATION FINDINGS:  GENERAL:  This is a 30 year old well- nourished, well-developed, African American male who is in discomfort, but no  acute distress. VITAL SIGNS:  Temperature 98.5, blood pressure 105/46, heart rate 78, respirations 15, O2 saturations 100%. HEENT:  Normocephalic, atraumatic.  Pupils equally round and reactive to light.  Extraocular movements are intact.  Funduscopic benign.  There is no scleral icterus.  Nares are patent bilaterally.  Oropharynx is clear. NECK:  Supple, full range of motion.  No thyromegaly, adenopathy, or jugular venous distention. CARDIOVASCULAR:  Regular rate and rhythm.  No murmurs, gallops, or rubs. Chest wall nontender.  Chest wall excursion symmetric.  Breathing is unlabored.  LUNGS:  Clear to auscultation bilaterally.  No rales, rhonchi, or wheezes. ABDOMEN:  Positive bowel sounds, soft, nontender, nondistended.  No hepatosplenomegaly. BACK:  No costovertebral angle tenderness.  No spinous process tenderness and there are no visible or palpable back spasms. EXTREMITIES:  Without cyanosis, clubbing, or edema. NEUROLOGIC:  Nonfocal.  LABORATORY STUDIES:  White blood cell count 16.3, hemoglobin 8.9, hematocrit 24.6, MCV 83.1, and platelets 678.  Neutrophils 67%, lymphocytes 24%.  Sodium 137, potassium 4.1, chloride 100, carbon dioxide 27, BUN 7, creatinine 0.59, and glucose 96.  Reticulocyte count 14.5%, normal range 0.4 to 3.1.  RBCs 2.96.  Absolute reticulocyte count 429.2 and the normal range is 19 to 186.  ASSESSMENT:  30 year old male being admitted with: 1. Sickle-cell basal occlusive crisis. 2.  Anemia secondary to sickle cell disease. 3. Leukocytosis, most likely a stress leukocytosis from pain.  PLAN:  Patient will be admitted to a med surgery and pain control therapy will be ordered with IV Dilaudid.  IV fluids have also been ordered for rehydration.  A type and screen will be sent in the event patient needs to be transfused.  DVT prophylaxis has also been ordered. Further workup will ensue pending results of patient's clinical course.     Della Goo,  M.D.     HJ/MEDQ  D:  05/21/2011  T:  05/21/2011  Job:  [id number]  Electronically Signed by Della Goo M.D. on 05/23/2011 02:23:40 AM

## 2011-05-24 LAB — COMPREHENSIVE METABOLIC PANEL
AST: 32 U/L (ref 0–37)
CO2: 27 mEq/L (ref 19–32)
Calcium: 9.5 mg/dL (ref 8.4–10.5)
Creatinine, Ser: 0.48 mg/dL — ABNORMAL LOW (ref 0.50–1.35)
GFR calc non Af Amer: >90 mL/min (ref >90)
Total Protein: 8.1 g/dL (ref 6.0–8.3)

## 2011-05-24 LAB — CBC
MCH: 29.5 pg (ref 26.0–34.0)
MCHC: 36 g/dL (ref 30.0–36.0)
MCV: 81.8 fL (ref 78.0–100.0)
Platelets: 553 10*3/uL — ABNORMAL HIGH (ref 150–400)
RBC: 2.58 MIL/uL — ABNORMAL LOW (ref 4.22–5.81)
RDW: 19.2 % — ABNORMAL HIGH (ref 11.5–15.5)

## 2011-05-25 LAB — CBC
Hemoglobin: 7.8 g/dL — ABNORMAL LOW (ref 13.0–17.0)
MCH: 29.9 pg (ref 26.0–34.0)
RBC: 2.61 MIL/uL — ABNORMAL LOW (ref 4.22–5.81)

## 2011-05-25 LAB — COMPREHENSIVE METABOLIC PANEL
ALT: 37 U/L (ref 0–53)
Alkaline Phosphatase: 81 U/L (ref 39–117)
CO2: 27 mEq/L (ref 19–32)
Calcium: 9.7 mg/dL (ref 8.4–10.5)
GFR calc Af Amer: >90 mL/min (ref >90)
GFR calc non Af Amer: >90 mL/min (ref >90)
Glucose, Bld: 98 mg/dL (ref 70–99)
Potassium: 4.2 mEq/L (ref 3.5–5.1)
Sodium: 133 mEq/L — ABNORMAL LOW (ref 135–145)

## 2011-06-16 NOTE — Discharge Summary (Signed)
[name], [name] NO.:  [id number]  MEDICAL RECORD NO.:  [id number]  LOCATION:  1321                         FACILITY:  [facility]  PHYSICIAN:  Waverly Tarquinio L. August Saucer, M.D.     DATE OF BIRTH:  1980/11/22  DATE OF ADMISSION:  05/21/2011 DATE OF DISCHARGE:  05/26/2011                              DISCHARGE SUMMARY   FINAL DIAGNOSES: 1. Hemoglobin SS disease with crisis. 2. Occult sinus disease, rule out. 3. Left shoulder pain secondary to sickle cell crisis, negative x-ray.  OPERATIONS/PROCEDURES:  None.  HISTORY OF PRESENT ILLNESS:  The patient is a 30 year old single black male with history of sickle cell anemia, who presented to the emergency department with complaints of worsening severe low back pain and left shoulder pain along with headache which started in the morning shortly after awakening.  The patient described his pain as 9/10 and throbbing. His pain was unrelieved with his home medication.  He denied any fevers, chills, night sweats, nausea, vomiting, chest pain, or shortness of breath.  He denied any constipation or diarrhea.  The patient was seen in emergency room and evaluated.  He was found to have an elevated reticulocyte count and mild leukocytosis.  The patient was subsequently admitted for further treatment.  Past medical history and physical exam as per admission H and P.  HOSPITAL COURSE:  The patient was admitted to the hospitalist service for further treatment of his sickle cell crisis.  He was started on IV fluids with normal saline initially.  He was treated with Dilaudid for control of his pain.  Over the subsequent days, he made a slow, but steady improvement.  He was transferred to my service for further treatment on May 23, 2011.  At that time, he rated his pain as a 7/10.  He had left shoulder pain as well.  An x-ray of the left shoulder was obtained, which did not show evidence for osteonecrosis.  A hemoglobin electrophoresis was  ordered as well as the patient was unsure of his type.  This was still pending at the time of discharge.  The patient continued to make steady progress.  He did develop a low- grade temperature of 99.3 on October 9.  He had complaints of greenish secretions from his sinuses.  His sinus exam, however, was unremarkable. The patient was placed on empiric doxycycline in lieu of his sickle cell status.  He continues to do well over the subsequent days.  By October 11, he was feeling much better.  He felt that his pain was manageable.  He was afebrile and vital signs were stable.  The patient was subsequently discharged home and improved.  MEDICATIONS AT THE TIME OF DISCHARGE:  Consisted of; 1. Doxycycline 100 mg b.i.d. for an additional 10 days. 2. Oxycodone with acetaminophen 10/650 mg 1/2 to 2 tablets q.4 hours     p.r.n. severe pain. 3. Aspirin 325 mg daily. 4. Folic acid 1 mg daily. 5. Hydroxyurea 500 mg 2 capsules twice a day. 6. [facility], which she has used before topically 4 times a day as     needed.  The patient will be maintained on a regular diet.  He  is     to be seen in office in 10 days' time.          ______________________________ Lind Guest August Saucer, M.D.     ELD/MEDQ  D:  06/16/2011  T:  06/16/2011  Job:  [id number]  Electronically Signed by Willey Blade M.D. on 06/16/2011 09:52:25 AM

## 2011-09-13 DIAGNOSIS — D5700 Hb-SS disease with crisis, unspecified: Secondary | ICD-10-CM | POA: Diagnosis not present

## 2011-10-27 DIAGNOSIS — D5700 Hb-SS disease with crisis, unspecified: Secondary | ICD-10-CM | POA: Diagnosis not present

## 2011-10-31 DIAGNOSIS — D5700 Hb-SS disease with crisis, unspecified: Secondary | ICD-10-CM | POA: Diagnosis not present

## 2011-12-06 DIAGNOSIS — D5700 Hb-SS disease with crisis, unspecified: Secondary | ICD-10-CM | POA: Diagnosis not present

## 2012-01-20 DIAGNOSIS — D5700 Hb-SS disease with crisis, unspecified: Secondary | ICD-10-CM | POA: Diagnosis not present

## 2012-01-20 DIAGNOSIS — E559 Vitamin D deficiency, unspecified: Secondary | ICD-10-CM | POA: Diagnosis not present

## 2012-01-20 DIAGNOSIS — J209 Acute bronchitis, unspecified: Secondary | ICD-10-CM | POA: Diagnosis not present

## 2012-01-20 DIAGNOSIS — D5720 Sickle-cell/Hb-C disease without crisis: Secondary | ICD-10-CM | POA: Diagnosis not present

## 2012-01-20 DIAGNOSIS — R079 Chest pain, unspecified: Secondary | ICD-10-CM | POA: Diagnosis not present

## 2012-02-02 DIAGNOSIS — D571 Sickle-cell disease without crisis: Secondary | ICD-10-CM | POA: Diagnosis not present

## 2012-03-06 DIAGNOSIS — E559 Vitamin D deficiency, unspecified: Secondary | ICD-10-CM | POA: Diagnosis not present

## 2012-03-06 DIAGNOSIS — D5700 Hb-SS disease with crisis, unspecified: Secondary | ICD-10-CM | POA: Diagnosis not present

## 2012-05-07 DIAGNOSIS — E559 Vitamin D deficiency, unspecified: Secondary | ICD-10-CM | POA: Diagnosis not present

## 2012-05-07 DIAGNOSIS — J0100 Acute maxillary sinusitis, unspecified: Secondary | ICD-10-CM | POA: Diagnosis not present

## 2012-05-07 DIAGNOSIS — J301 Allergic rhinitis due to pollen: Secondary | ICD-10-CM | POA: Diagnosis not present

## 2012-05-07 DIAGNOSIS — D571 Sickle-cell disease without crisis: Secondary | ICD-10-CM | POA: Diagnosis not present

## 2012-07-02 DIAGNOSIS — E7800 Pure hypercholesterolemia, unspecified: Secondary | ICD-10-CM | POA: Diagnosis not present

## 2012-07-02 DIAGNOSIS — I10 Essential (primary) hypertension: Secondary | ICD-10-CM | POA: Diagnosis not present

## 2012-07-02 DIAGNOSIS — M1990 Unspecified osteoarthritis, unspecified site: Secondary | ICD-10-CM | POA: Diagnosis not present

## 2012-07-02 DIAGNOSIS — J301 Allergic rhinitis due to pollen: Secondary | ICD-10-CM | POA: Diagnosis not present

## 2012-07-19 DIAGNOSIS — Z5181 Encounter for therapeutic drug level monitoring: Secondary | ICD-10-CM | POA: Diagnosis not present

## 2012-07-19 DIAGNOSIS — E559 Vitamin D deficiency, unspecified: Secondary | ICD-10-CM | POA: Diagnosis not present

## 2012-07-19 DIAGNOSIS — D571 Sickle-cell disease without crisis: Secondary | ICD-10-CM | POA: Diagnosis not present

## 2012-07-19 DIAGNOSIS — Z79899 Other long term (current) drug therapy: Secondary | ICD-10-CM | POA: Diagnosis not present

## 2012-08-02 ENCOUNTER — Encounter (HOSPITAL_COMMUNITY): Payer: Self-pay

## 2012-08-02 ENCOUNTER — Emergency Department (HOSPITAL_COMMUNITY)
Admission: EM | Admit: 2012-08-02 | Discharge: 2012-08-02 | Disposition: A | Discharge status: Home / Self Care | Payer: Medicare Other | Attending: Emergency Medicine | Admitting: Emergency Medicine

## 2012-08-02 DIAGNOSIS — M25559 Pain in unspecified hip: Secondary | ICD-10-CM | POA: Diagnosis not present

## 2012-08-02 DIAGNOSIS — M25569 Pain in unspecified knee: Secondary | ICD-10-CM | POA: Diagnosis not present

## 2012-08-02 DIAGNOSIS — Z79899 Other long term (current) drug therapy: Secondary | ICD-10-CM | POA: Diagnosis not present

## 2012-08-02 DIAGNOSIS — D5700 Hb-SS disease with crisis, unspecified: Secondary | ICD-10-CM | POA: Insufficient documentation

## 2012-08-02 LAB — CBC WITH DIFFERENTIAL/PLATELET
Basophils Relative: 0 % (ref 0–1)
Eosinophils Relative: 1 % (ref 0–5)
Hemoglobin: 9.2 g/dL — ABNORMAL LOW (ref 13.0–17.0)
Lymphs Abs: 2.7 10*3/uL (ref 0.7–4.0)
MCH: 32.4 pg (ref 26.0–34.0)
MCV: 87.3 fL (ref 78.0–100.0)
Monocytes Absolute: 0.8 10*3/uL (ref 0.1–1.0)
RBC: 2.84 MIL/uL — ABNORMAL LOW (ref 4.22–5.81)

## 2012-08-02 LAB — COMPREHENSIVE METABOLIC PANEL
ALT: 29 U/L (ref 0–53)
CO2: 25 mEq/L (ref 19–32)
Calcium: 9.3 mg/dL (ref 8.4–10.5)
Creatinine, Ser: 0.8 mg/dL (ref 0.50–1.35)
GFR calc Af Amer: >90 mL/min (ref >90)
GFR calc non Af Amer: >90 mL/min (ref >90)
Glucose, Bld: 109 mg/dL — ABNORMAL HIGH (ref 70–99)
Sodium: 134 mEq/L — ABNORMAL LOW (ref 135–145)

## 2012-08-02 LAB — RETICULOCYTES: Retic Count, Absolute: 389.1 10*3/uL — ABNORMAL HIGH (ref 19.0–186.0)

## 2012-08-02 MED ORDER — HYDROMORPHONE HCL PF 1 MG/ML IJ SOLN
1.0000 mg | Freq: Once | INTRAMUSCULAR | Status: AC
  Administered 2012-08-02: 1 mg via INTRAVENOUS
  Filled 2012-08-02: qty 1

## 2012-08-02 MED ORDER — SODIUM CHLORIDE 0.9 % IV BOLUS (SEPSIS)
1000.0000 mL | Freq: Once | INTRAVENOUS | Status: AC
  Administered 2012-08-02: 1000 mL via INTRAVENOUS

## 2012-08-02 MED ORDER — KETOROLAC TROMETHAMINE 30 MG/ML IJ SOLN
30.0000 mg | Freq: Once | INTRAMUSCULAR | Status: AC
  Administered 2012-08-02: 30 mg via INTRAVENOUS
  Filled 2012-08-02: qty 1

## 2012-08-02 NOTE — ED Provider Notes (Signed)
History     CSN: [id number]  Arrival date & time 08/02/12  0026   First MD Initiated Contact with Patient 08/02/12 0255      Chief Complaint  Patient presents with  . Sickle Cell Pain Crisis   HPI  History provided by the patient. Patient is a 31 year old male with history of sickle cell anemia who presents with complaints of typical sickle cell pain crisis on improve with home pain medications. Symptoms began 2 evenings ago and have been persistent often the day yesterday. Pain is primary located in the left hip, thigh and knee area. Pain is his typical pain for sickle cell. Patient has been taking his home Percocet 10/650 without any significant improvements. His last dose was around 11:30 PM prior to arrival in the emergency room this morning. Patient denies having any other associated symptoms. He denies any fever, chills or sweats. Denies any abdominal pain or flank pain. Denies any urinary symptoms.    Past Medical History  Diagnosis Date  . Sickle cell anemia     Past Surgical History  Procedure Date  . Cholecystectomy     History reviewed. No pertinent family history.  History  Substance Use Topics  . Smoking status: Never Smoker   . Smokeless tobacco: Not on file  . Alcohol Use: No      Review of Systems  Constitutional: Negative for fever, chills and diaphoresis.  Respiratory: Negative for cough.   Cardiovascular: Negative for chest pain.  Gastrointestinal: Negative for nausea, vomiting and abdominal pain.  Genitourinary: Negative for dysuria, frequency, hematuria and flank pain.  Musculoskeletal:       Left hip thigh and knee pain  All other systems reviewed and are negative.    Allergies  Review of patient's allergies indicates no known allergies.  Home Medications   Current Outpatient Rx  Name  Route  Sig  Dispense  Refill  . VITAMIN D 1000 UNITS PO TABS   Oral   Take 1,000 Units by mouth daily.         Marland Kitchen FOLIC ACID 400 MCG PO TABS    Oral   Take 400 mcg by mouth daily.         Marland Kitchen HYDROXYUREA 500 MG PO CAPS   Oral   Take 1,000 mg by mouth 2 (two) times daily. May take with food to minimize GI side effects.         . OXYCODONE-ACETAMINOPHEN 10-650 MG PO TABS   Oral   Take 1 tablet by mouth every 6 (six) hours as needed. For pain           BP 113/78  Pulse 87  Temp 98.1 F (36.7 C) (Oral)  Resp 22  Ht 6\' 1"  (1.854 m)  Wt 150 lb (68.04 kg)  BMI 19.79 kg/m2  SpO2 95%  Physical Exam  Nursing note and vitals reviewed. Constitutional: He is oriented to person, place, and time. He appears well-developed and well-nourished. No distress.  HENT:  Head: Normocephalic.  Cardiovascular: Normal rate and regular rhythm.   Pulmonary/Chest: Effort normal and breath sounds normal. No respiratory distress. He has no wheezes. He has no rales.  Abdominal: Soft. There is no tenderness. There is no rebound and no guarding.  Musculoskeletal: Normal range of motion. He exhibits no edema and no tenderness.  Neurological: He is alert and oriented to person, place, and time. Gait normal.  Skin: Skin is warm.  Psychiatric: He has a normal mood and affect. His behavior is  normal.    ED Course  Procedures   Results for orders placed during the hospital encounter of 08/02/12  CBC WITH DIFFERENTIAL      Component Value Range   WBC 11.9 (*) 4.0 - 10.5 K/uL   RBC 2.84 (*) 4.22 - 5.81 MIL/uL   Hemoglobin 9.2 (*) 13.0 - 17.0 g/dL   HCT 40.9 (*) 81.1 - 91.4 %   MCV 87.3  78.0 - 100.0 fL   MCH 32.4  26.0 - 34.0 pg   MCHC 37.1 (*) 30.0 - 36.0 g/dL   RDW 78.2 (*) 95.6 - 21.3 %   Platelets 350  150 - 400 K/uL   Neutrophils Relative 69  43 - 77 %   Lymphocytes Relative 23  12 - 46 %   Monocytes Relative 7  3 - 12 %   Eosinophils Relative 1  0 - 5 %   Basophils Relative 0  0 - 1 %   Neutro Abs 8.3 (*) 1.7 - 7.7 K/uL   Lymphs Abs 2.7  0.7 - 4.0 K/uL   Monocytes Absolute 0.8  0.1 - 1.0 K/uL   Eosinophils Absolute 0.1  0.0 - 0.7  K/uL   Basophils Absolute 0.0  0.0 - 0.1 K/uL   RBC Morphology TARGET CELLS    COMPREHENSIVE METABOLIC PANEL      Component Value Range   Sodium 134 (*) 135 - 145 mEq/L   Potassium 3.8  3.5 - 5.1 mEq/L   Chloride 98  96 - 112 mEq/L   CO2 25  19 - 32 mEq/L   Glucose, Bld 109 (*) 70 - 99 mg/dL   BUN 6  6 - 23 mg/dL   Creatinine, Ser 0.86  0.50 - 1.35 mg/dL   Calcium 9.3  8.4 - 57.8 mg/dL   Total Protein 8.1  6.0 - 8.3 g/dL   Albumin 4.3  3.5 - 5.2 g/dL   AST 41 (*) 0 - 37 U/L   ALT 29  0 - 53 U/L   Alkaline Phosphatase 64  39 - 117 U/L   Total Bilirubin 2.3 (*) 0.3 - 1.2 mg/dL   GFR calc non Af Amer >90  >90 mL/min   GFR calc Af Amer >90  >90 mL/min  RETICULOCYTES      Component Value Range   Retic Ct Pct 13.7 (*) 0.4 - 3.1 %   RBC. 2.84 (*) 4.22 - 5.81 MIL/uL   Retic Count, Manual 389.1 (*) 19.0 - 186.0 K/uL       1. Sickle cell crisis       MDM  3:10 AM patient seen and evaluated. Patient resting comfortably in the bed this does not appear in any severe significant discomfort. Patient reports pain is similar to previous sickle cell pains primary located in left hip and leg area.   Patient reports having significant improvement of pain symptoms after the first doses of medications. Patient also receiving IV fluids.  Labs unremarkable. Plan to give second dose of pain medications. And reassess patient.  Patient continues to feel better. Patient states he feels warm to return home. He will call Dr. August Saucer later today.   Angus Seller, [place] 08/02/12 [phone]

## 2012-08-02 NOTE — ED Provider Notes (Signed)
Medical screening examination/treatment/procedure(s) were performed by non-physician practitioner and as supervising physician I was immediately available for consultation/collaboration.  Matisse Salais T Adianna Darwin, MD 08/02/12 0817 

## 2012-08-02 NOTE — ED Notes (Signed)
Per pt, having sickle cell crisis.  Pain started last pm at 9pm.  Bil legs.

## 2012-08-04 ENCOUNTER — Telehealth (HOSPITAL_COMMUNITY): Payer: Self-pay

## 2012-08-16 DIAGNOSIS — J0100 Acute maxillary sinusitis, unspecified: Secondary | ICD-10-CM | POA: Diagnosis not present

## 2012-08-16 DIAGNOSIS — R079 Chest pain, unspecified: Secondary | ICD-10-CM | POA: Diagnosis not present

## 2012-08-16 DIAGNOSIS — J209 Acute bronchitis, unspecified: Secondary | ICD-10-CM | POA: Diagnosis not present

## 2012-08-16 DIAGNOSIS — D5720 Sickle-cell/Hb-C disease without crisis: Secondary | ICD-10-CM | POA: Diagnosis not present

## 2012-09-24 DIAGNOSIS — D571 Sickle-cell disease without crisis: Secondary | ICD-10-CM | POA: Diagnosis not present

## 2012-09-24 DIAGNOSIS — E559 Vitamin D deficiency, unspecified: Secondary | ICD-10-CM | POA: Diagnosis not present

## 2012-09-24 DIAGNOSIS — Z0000 Encounter for general adult medical examination without abnormal findings: Secondary | ICD-10-CM | POA: Diagnosis not present

## 2012-09-25 LAB — HEPATIC FUNCTION PANEL
AST: 28 U/L (ref 14–40)
Bilirubin, Total: 2.9 mg/dL

## 2012-09-25 LAB — BASIC METABOLIC PANEL: Glucose: 93 mg/dL

## 2012-10-25 DIAGNOSIS — E559 Vitamin D deficiency, unspecified: Secondary | ICD-10-CM | POA: Diagnosis not present

## 2012-10-25 DIAGNOSIS — D571 Sickle-cell disease without crisis: Secondary | ICD-10-CM | POA: Diagnosis not present

## 2012-10-25 DIAGNOSIS — J301 Allergic rhinitis due to pollen: Secondary | ICD-10-CM | POA: Diagnosis not present

## 2012-11-07 ENCOUNTER — Other Ambulatory Visit: Payer: Self-pay | Admitting: Internal Medicine

## 2012-11-07 ENCOUNTER — Ambulatory Visit
Admission: RE | Admit: 2012-11-07 | Discharge: 2012-11-07 | Disposition: A | Discharge status: Home / Self Care | Payer: Medicare Other | Source: Ambulatory Visit | Attending: Internal Medicine | Admitting: Internal Medicine

## 2012-11-07 DIAGNOSIS — M25552 Pain in left hip: Secondary | ICD-10-CM

## 2012-11-07 DIAGNOSIS — D571 Sickle-cell disease without crisis: Secondary | ICD-10-CM

## 2012-11-07 DIAGNOSIS — M25559 Pain in unspecified hip: Secondary | ICD-10-CM | POA: Diagnosis not present

## 2012-12-07 ENCOUNTER — Encounter: Payer: Self-pay | Admitting: General Practice

## 2012-12-14 DIAGNOSIS — D5720 Sickle-cell/Hb-C disease without crisis: Secondary | ICD-10-CM | POA: Diagnosis not present

## 2012-12-25 ENCOUNTER — Other Ambulatory Visit (HOSPITAL_COMMUNITY): Payer: Self-pay | Admitting: Internal Medicine

## 2012-12-25 DIAGNOSIS — I272 Pulmonary hypertension, unspecified: Secondary | ICD-10-CM

## 2012-12-26 ENCOUNTER — Ambulatory Visit (HOSPITAL_COMMUNITY): Payer: Medicare Other | Attending: Cardiovascular Disease

## 2012-12-26 DIAGNOSIS — I059 Rheumatic mitral valve disease, unspecified: Secondary | ICD-10-CM | POA: Diagnosis not present

## 2012-12-26 DIAGNOSIS — D571 Sickle-cell disease without crisis: Secondary | ICD-10-CM | POA: Diagnosis not present

## 2012-12-26 DIAGNOSIS — I379 Nonrheumatic pulmonary valve disorder, unspecified: Secondary | ICD-10-CM | POA: Diagnosis not present

## 2012-12-26 DIAGNOSIS — I272 Pulmonary hypertension, unspecified: Secondary | ICD-10-CM

## 2012-12-26 DIAGNOSIS — I079 Rheumatic tricuspid valve disease, unspecified: Secondary | ICD-10-CM | POA: Insufficient documentation

## 2012-12-26 DIAGNOSIS — I2789 Other specified pulmonary heart diseases: Secondary | ICD-10-CM

## 2012-12-26 NOTE — Progress Notes (Signed)
Echocardiogram performed.  

## 2012-12-27 ENCOUNTER — Encounter (HOSPITAL_COMMUNITY): Payer: Self-pay | Admitting: Internal Medicine

## 2012-12-27 DIAGNOSIS — D571 Sickle-cell disease without crisis: Secondary | ICD-10-CM | POA: Diagnosis not present

## 2012-12-27 NOTE — Progress Notes (Signed)
Quick Note:  Please call [name] and let him know that his ECHO was okay ______

## 2013-01-18 ENCOUNTER — Ambulatory Visit (HOSPITAL_COMMUNITY)
Admission: AD | Admit: 2013-01-18 | Discharge: 2013-01-18 | Disposition: A | Discharge status: Home / Self Care | Payer: Medicare Other | Source: Ambulatory Visit | Attending: Internal Medicine | Admitting: Internal Medicine

## 2013-01-18 ENCOUNTER — Ambulatory Visit (INDEPENDENT_AMBULATORY_CARE_PROVIDER_SITE_OTHER): Payer: Medicare Other | Admitting: Internal Medicine

## 2013-01-18 ENCOUNTER — Encounter: Payer: Self-pay | Admitting: Internal Medicine

## 2013-01-18 VITALS — BP 126/65 | HR 73 | Temp 98.0°F | Ht 73.0 in | Wt 148.0 lb

## 2013-01-18 DIAGNOSIS — D571 Sickle-cell disease without crisis: Secondary | ICD-10-CM

## 2013-01-18 DIAGNOSIS — IMO0001 Reserved for inherently not codable concepts without codable children: Secondary | ICD-10-CM | POA: Insufficient documentation

## 2013-01-18 DIAGNOSIS — R52 Pain, unspecified: Secondary | ICD-10-CM

## 2013-01-18 DIAGNOSIS — Z79899 Other long term (current) drug therapy: Secondary | ICD-10-CM | POA: Insufficient documentation

## 2013-01-18 DIAGNOSIS — D5700 Hb-SS disease with crisis, unspecified: Secondary | ICD-10-CM | POA: Insufficient documentation

## 2013-01-18 LAB — COMPREHENSIVE METABOLIC PANEL
AST: 27 U/L (ref 0–37)
Albumin: 4.3 g/dL (ref 3.5–5.2)
Chloride: 102 mEq/L (ref 96–112)
Creatinine, Ser: 0.81 mg/dL (ref 0.50–1.35)
Potassium: 4 mEq/L (ref 3.5–5.1)
Total Bilirubin: 1.9 mg/dL — ABNORMAL HIGH (ref 0.3–1.2)

## 2013-01-18 LAB — CBC WITH DIFFERENTIAL/PLATELET
Basophils Relative: 1 % (ref 0–1)
Eosinophils Absolute: 0.4 10*3/uL (ref 0.0–0.7)
Eosinophils Relative: 4 % (ref 0–5)
Hemoglobin: 9.9 g/dL — ABNORMAL LOW (ref 13.0–17.0)
Lymphocytes Relative: 49 % — ABNORMAL HIGH (ref 12–46)
Neutrophils Relative %: 37 % — ABNORMAL LOW (ref 43–77)
RBC: 3.17 MIL/uL — ABNORMAL LOW (ref 4.22–5.81)

## 2013-01-18 LAB — MAGNESIUM: Magnesium: 2 mg/dL (ref 1.5–2.5)

## 2013-01-18 LAB — RETICULOCYTES: Retic Count, Absolute: 358.2 10*3/uL — ABNORMAL HIGH (ref 19.0–186.0)

## 2013-01-18 MED ORDER — OXYCODONE-ACETAMINOPHEN 10-650 MG PO TABS: 1.0000 | ORAL_TABLET | Freq: Four times a day (QID) | ORAL | Status: DC | PRN

## 2013-01-18 NOTE — Progress Notes (Addendum)
Diagnosis: sickle cell Labs drawn per order

## 2013-01-18 NOTE — Progress Notes (Signed)
  Subjective:    Patient ID: [name], male    DOB: 06-07-1981, 32 y.o.   MRN: [id number]  HPI: Pt here for follow up for hid Sickle Cell Anemia and states that he has been doing well. He was supposed to have labs done after his last appointment but neglected to do so.  Patient has no complaints at this time. He states that he saw Dr. Oswaldo Done for ophthalmology and was told that he had no retinal disease. We also reviewed his 2-D echocardiogram which showed no evidence of pulmonary hypertension at this time. Patient states that he has been having pain about every 3-4 days and takes approximately 1-2 Percocet per day as needed. He does have several days and he goes without having pain.    Review of Systems  Constitutional: Negative.   HENT: Negative.   Eyes: Negative.   Respiratory: Negative.   Cardiovascular: Negative.   Gastrointestinal: Negative.   Endocrine: Negative.   Genitourinary: Negative.   Musculoskeletal: Positive for myalgias.  Skin: Negative.   Allergic/Immunologic: Negative.   Neurological: Negative.   Hematological: Negative.   Psychiatric/Behavioral: Negative.        Objective:   Physical Exam  Constitutional: He is oriented to person, place, and time. He appears well-developed and well-nourished. No distress.  HENT:  Head: Normocephalic and atraumatic.  Eyes: Conjunctivae and EOM are normal. Pupils are equal, round, and reactive to light. No scleral icterus.  Neck: Normal range of motion. Neck supple.  Cardiovascular: Normal rate and regular rhythm.  Exam reveals no gallop and no friction rub.   No murmur heard. Pulmonary/Chest: Effort normal and breath sounds normal. He has no wheezes. He has no rales. He exhibits no tenderness.  Abdominal: Soft. Bowel sounds are normal. He exhibits no mass.  Musculoskeletal: Normal range of motion.  Neurological: He is alert and oriented to person, place, and time.  Skin: Skin is warm and dry.  Psychiatric: He has a normal  mood and affect. His behavior is normal. Judgment and thought content normal.          Assessment & Plan:  1 hemoglobin SS with crisis: I reviewed the patient's echocardiogram with him I will order his labs today and have been drawn today. I will evaluate his laboratory data is within later date. I will also refill his prescription for Percocets to be used on as-needed basis. Patient will followup in one month for labs are as needed.

## 2013-01-21 ENCOUNTER — Telehealth: Payer: Self-pay | Admitting: *Deleted

## 2013-01-21 DIAGNOSIS — R52 Pain, unspecified: Secondary | ICD-10-CM

## 2013-01-21 DIAGNOSIS — D571 Sickle-cell disease without crisis: Secondary | ICD-10-CM

## 2013-01-21 MED ORDER — OXYCODONE-ACETAMINOPHEN 10-325 MG PO TABS: 1.0000 | ORAL_TABLET | Freq: Four times a day (QID) | ORAL | Status: DC | PRN

## 2013-01-21 NOTE — Telephone Encounter (Signed)
Pt calls stating that his Percocet Rx that he RCVD at this appt was for the wrong dose 10/650 so he was unable to fill Rx.  Pt request correct dosage of Percocet 10/325.  Pt request p/u for today if possible.

## 2013-01-22 LAB — HEMOGLOBINOPATHY EVALUATION
Hemoglobin Other: 0 %
Hgb A: 0 % — ABNORMAL LOW (ref 96.8–97.8)
Hgb F Quant: 18.7 % — ABNORMAL HIGH (ref 0.0–2.0)

## 2013-01-23 ENCOUNTER — Ambulatory Visit: Payer: Medicare Other | Admitting: Internal Medicine

## 2013-01-23 NOTE — Progress Notes (Signed)
Pt walked into clinic to p/u his corrected Rx for Percocet 10/325.  Pt returned the original incorrect Rx for Percocet 10/650.  Incorrect Rx has been shredded.

## 2013-02-18 ENCOUNTER — Encounter: Payer: Self-pay | Admitting: Internal Medicine

## 2013-02-18 ENCOUNTER — Ambulatory Visit (INDEPENDENT_AMBULATORY_CARE_PROVIDER_SITE_OTHER): Payer: Medicare Other | Admitting: Internal Medicine

## 2013-02-18 ENCOUNTER — Ambulatory Visit (HOSPITAL_COMMUNITY)
Admission: AD | Admit: 2013-02-18 | Discharge: 2013-02-18 | Disposition: A | Discharge status: Home / Self Care | Payer: Medicare Other | Source: Ambulatory Visit | Attending: Internal Medicine | Admitting: Internal Medicine

## 2013-02-18 VITALS — BP 108/85 | HR 85 | Temp 97.5°F | Resp 14 | Ht 73.0 in | Wt 145.0 lb

## 2013-02-18 DIAGNOSIS — R52 Pain, unspecified: Secondary | ICD-10-CM | POA: Diagnosis not present

## 2013-02-18 DIAGNOSIS — G894 Chronic pain syndrome: Secondary | ICD-10-CM | POA: Diagnosis not present

## 2013-02-18 DIAGNOSIS — D571 Sickle-cell disease without crisis: Secondary | ICD-10-CM

## 2013-02-18 DIAGNOSIS — Z79899 Other long term (current) drug therapy: Secondary | ICD-10-CM | POA: Insufficient documentation

## 2013-02-18 MED ORDER — OXYCODONE-ACETAMINOPHEN 10-325 MG PO TABS: 1.0000 | ORAL_TABLET | Freq: Four times a day (QID) | ORAL | Status: DC | PRN

## 2013-02-18 NOTE — Progress Notes (Signed)
Pt arrived at center for lab draw; unable to draw labs after 2 attempts; pt states will come to primary care clinic in the AM for lab draw

## 2013-02-18 NOTE — Progress Notes (Signed)
  Subjective:    Patient ID: [name], male    DOB: 07-27-1981, 32 y.o.   MRN: [id number]  HPI: Pt here for follow up visit on Hb SS. He states that he has been doing well. He has had several episodes of pain but will first try meditation and guided imagery before resorting to pain medications. He feel that this works best for him. He shared that he uses an app on his phone called "Buddist meditation" to assist him with his meditation.  He admits that he has not been entirely compliant with his Hydrea as he sometimes travels for weeks and will forget to take his medication. He states that he is compliant with folic acid.    Review of Systems  Constitutional: Negative.   HENT: Negative.   Eyes: Negative.   Respiratory: Negative.   Cardiovascular: Negative.   Gastrointestinal: Negative.   Endocrine: Negative.   Genitourinary: Negative.   Musculoskeletal: Positive for myalgias (Generalized over the weekend but now resolved). Negative for arthralgias.  Skin: Negative.   Allergic/Immunologic: Negative.   Neurological: Negative.   Hematological: Negative.   Psychiatric/Behavioral: Negative.        Objective:   Physical Exam  Constitutional: He is oriented to person, place, and time. He appears well-developed and well-nourished. No distress.  HENT:  Head: Atraumatic.  Eyes: Conjunctivae and EOM are normal. Pupils are equal, round, and reactive to light. No scleral icterus.  Neck: Normal range of motion. Neck supple.  Cardiovascular: Normal rate and regular rhythm.  Exam reveals no gallop and no friction rub.   No murmur heard. Pulmonary/Chest: Effort normal and breath sounds normal. He has no wheezes. He has no rales. He exhibits no tenderness.  Abdominal: Soft. Bowel sounds are normal. He exhibits no mass.  Musculoskeletal: Normal range of motion.  Neurological: He is alert and oriented to person, place, and time.  Skin: Skin is warm and dry.  Psychiatric: He has a normal mood  and affect. His behavior is normal. Judgment and thought content normal.          Assessment & Plan:  1. Hb SS: Pt is on Hydrea but has been inconsistent with its use. We discussed the importance of Hydrea and it role in preventing complication. He acknowledged understanding and is committed to improving compliance.  Pt received a prescription for Percocet 10/325 mg #90 tabs. Pt states that he has sufficient Hydrea at home. Pt is otherwise doing well. Will check labs for Hydrea surveillance. Pt advised that he needs an appointment with his hematologist as his last examination was more than 1 year ago.   Labs: CBC with diff, CMET, Reticulocytes RTC: 3 months

## 2013-03-12 ENCOUNTER — Telehealth: Payer: Self-pay | Admitting: Internal Medicine

## 2013-03-12 DIAGNOSIS — D571 Sickle-cell disease without crisis: Secondary | ICD-10-CM

## 2013-03-12 DIAGNOSIS — G894 Chronic pain syndrome: Secondary | ICD-10-CM

## 2013-03-12 MED ORDER — OXYCODONE-ACETAMINOPHEN 10-325 MG PO TABS: 1.0000 | ORAL_TABLET | Freq: Four times a day (QID) | ORAL | Status: DC | PRN

## 2013-03-12 NOTE — Telephone Encounter (Signed)
Refilled for Percocet 10/325 ready for pick up last written 02/18/13 a 22 day supply which lasted for a 30 days next refill due on 04/05/13

## 2013-03-15 ENCOUNTER — Other Ambulatory Visit: Payer: Self-pay | Admitting: Internal Medicine

## 2013-03-15 ENCOUNTER — Other Ambulatory Visit: Payer: Medicare Other | Admitting: *Deleted

## 2013-03-15 DIAGNOSIS — D571 Sickle-cell disease without crisis: Secondary | ICD-10-CM

## 2013-03-15 LAB — CBC WITH DIFFERENTIAL/PLATELET
Basophils Absolute: 0 10*3/uL (ref 0.0–0.1)
Basophils Relative: 1 % (ref 0–1)
Eosinophils Absolute: 0.1 10*3/uL (ref 0.0–0.7)
Lymphs Abs: 2.3 10*3/uL (ref 0.7–4.0)
MCH: 33.3 pg (ref 26.0–34.0)
Neutrophils Relative %: 45 % (ref 43–77)
Platelets: 711 10*3/uL — ABNORMAL HIGH (ref 150–400)
RBC: 2.88 MIL/uL — ABNORMAL LOW (ref 4.22–5.81)
RDW: 18.7 % — ABNORMAL HIGH (ref 11.5–15.5)

## 2013-03-15 LAB — COMPREHENSIVE METABOLIC PANEL
ALT: 14 U/L (ref 0–53)
AST: 27 U/L (ref 0–37)
Alkaline Phosphatase: 57 U/L (ref 39–117)
CO2: 28 mEq/L (ref 19–32)
Sodium: 139 mEq/L (ref 135–145)
Total Bilirubin: 2.1 mg/dL — ABNORMAL HIGH (ref 0.3–1.2)
Total Protein: 7.5 g/dL (ref 6.0–8.3)

## 2013-03-15 LAB — RETICULOCYTES
RBC.: 2.88 MIL/uL — ABNORMAL LOW (ref 4.22–5.81)
Retic Ct Pct: 4.1 % — ABNORMAL HIGH (ref 0.4–2.3)

## 2013-04-12 ENCOUNTER — Telehealth: Payer: Self-pay | Admitting: Internal Medicine

## 2013-04-12 DIAGNOSIS — G894 Chronic pain syndrome: Secondary | ICD-10-CM

## 2013-04-12 DIAGNOSIS — D571 Sickle-cell disease without crisis: Secondary | ICD-10-CM

## 2013-04-12 MED ORDER — OXYCODONE-ACETAMINOPHEN 10-325 MG PO TABS: 1.0000 | ORAL_TABLET | Freq: Four times a day (QID) | ORAL | Status: DC | PRN

## 2013-04-12 NOTE — Telephone Encounter (Signed)
Pt requesting a refill last received Percocet 10/325 Q 6hrs prn # 90 this was a 20 day supply which lasted 30 days this is ready for pick up on Tues. Office is closed on Monday

## 2013-04-18 IMAGING — CR DG PELVIS 1-2V
1 series · 1 of 1 positions shown · non-contrast
Comparison: MRI 08/01/2009.

CLINICAL DATA: Left hip pain intermittently for 6 months to 1 year.
History of sickle cell disease.

PELVIS - 1-2 VIEW

[view not recorded]
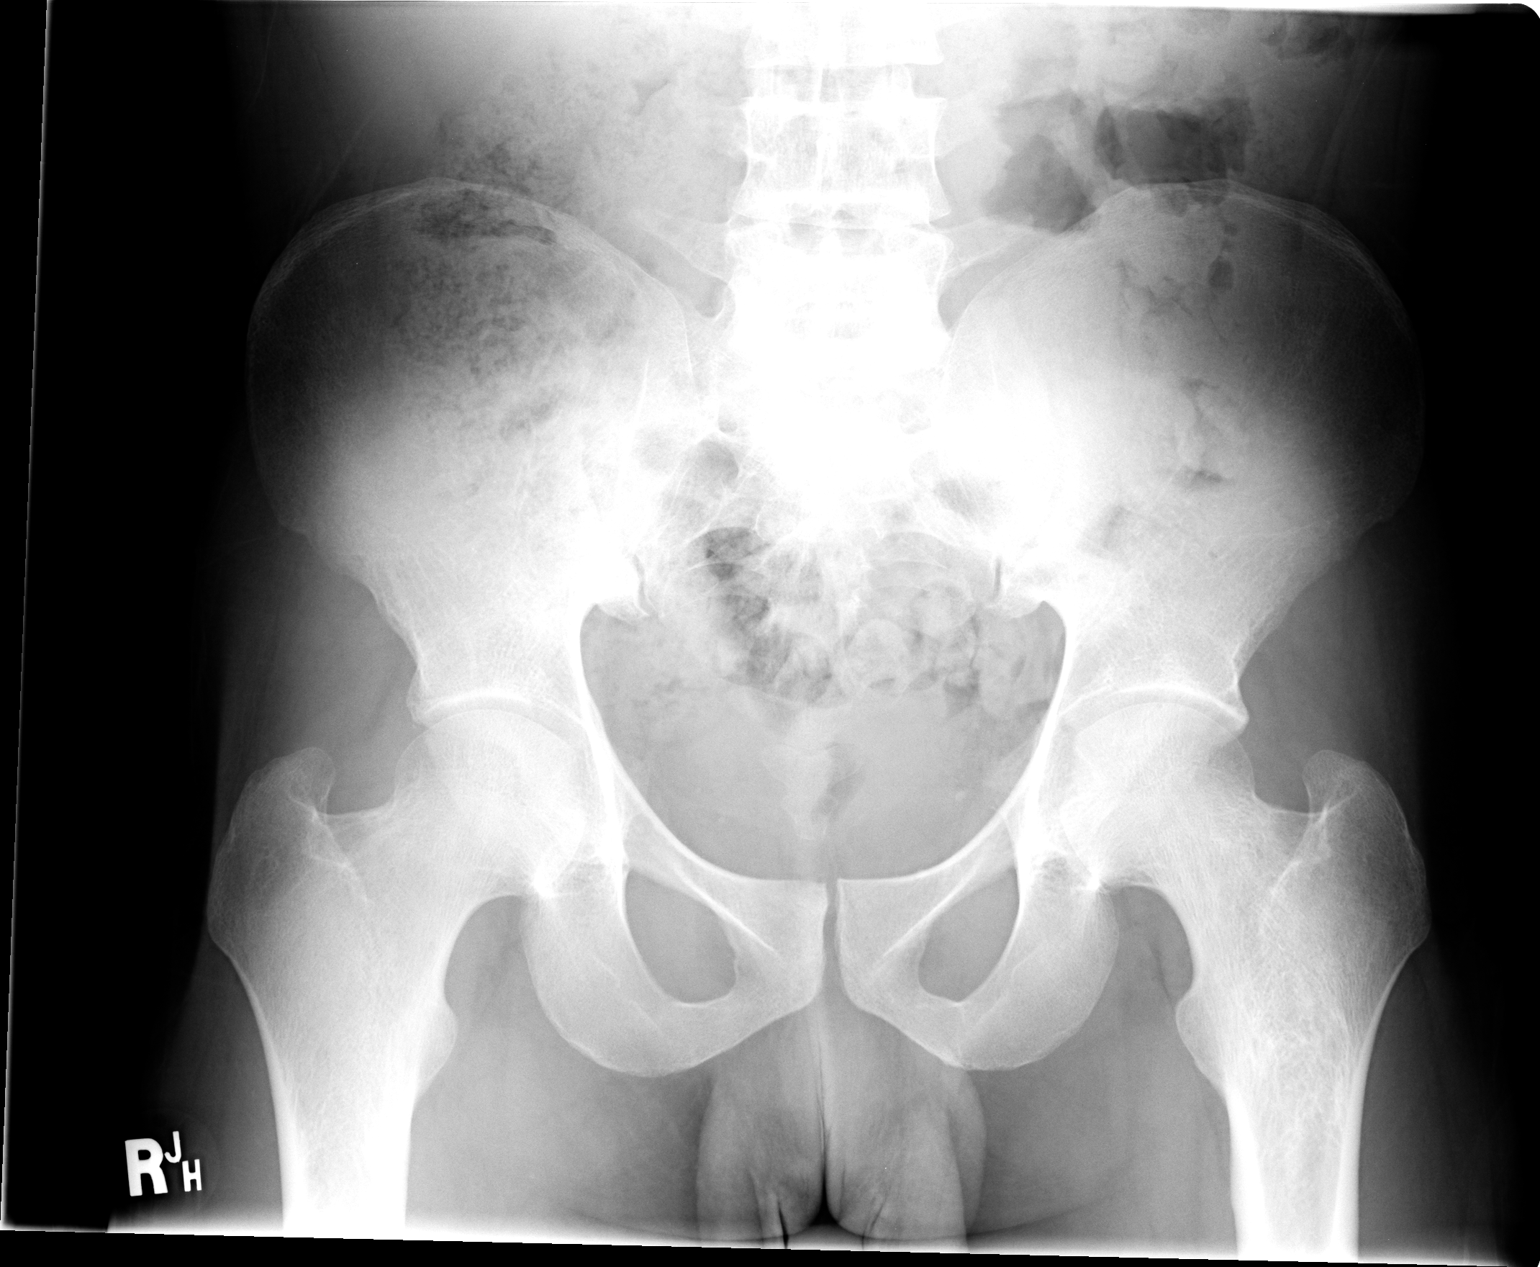

[1 of 1 positions shown; findings below may reference images not displayed]

FINDINGS: No acute bony or joint abnormality is identified.  Bone
infarct is seen in the proximal left femur.  No evidence of
avascular necrosis of the femoral heads is identified.  Joint
spaces are preserved.  There is no fracture.  Imaged soft tissue
structures are unremarkable.
IMPRESSION: 1.  No acute finding.
2.  Bony infarct left femur as seen on prior MRI.

## 2013-04-18 IMAGING — CR DG HIP (WITH OR WITHOUT PELVIS) 2-3V*L*
2 series · 2 of 2 positions shown · non-contrast
Comparison: MRI 08/01/2009.

CLINICAL DATA: Left hip pain.

LEFT HIP - COMPLETE 2+ VIEW

[view not recorded (1 of 2)]
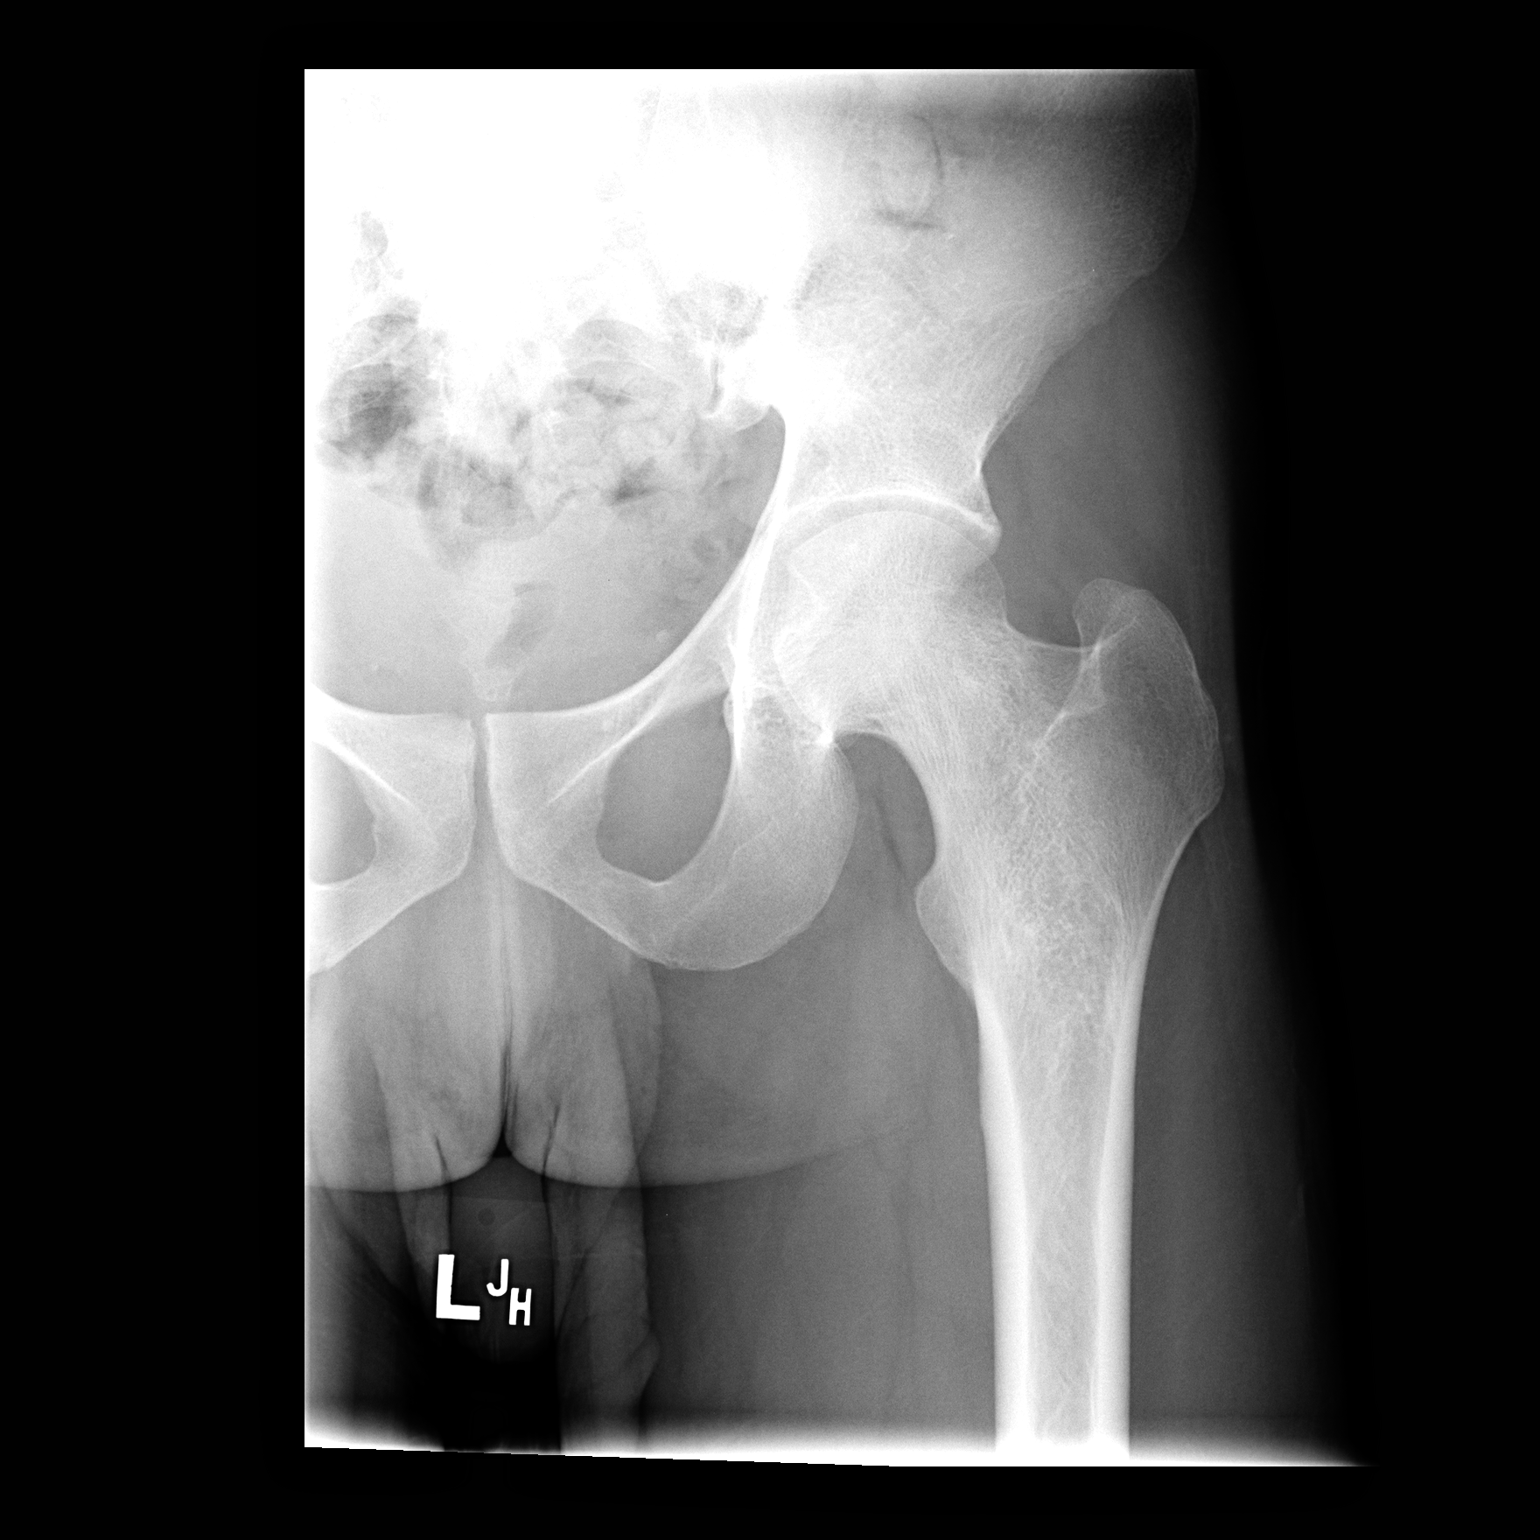

[view not recorded (2 of 2)]
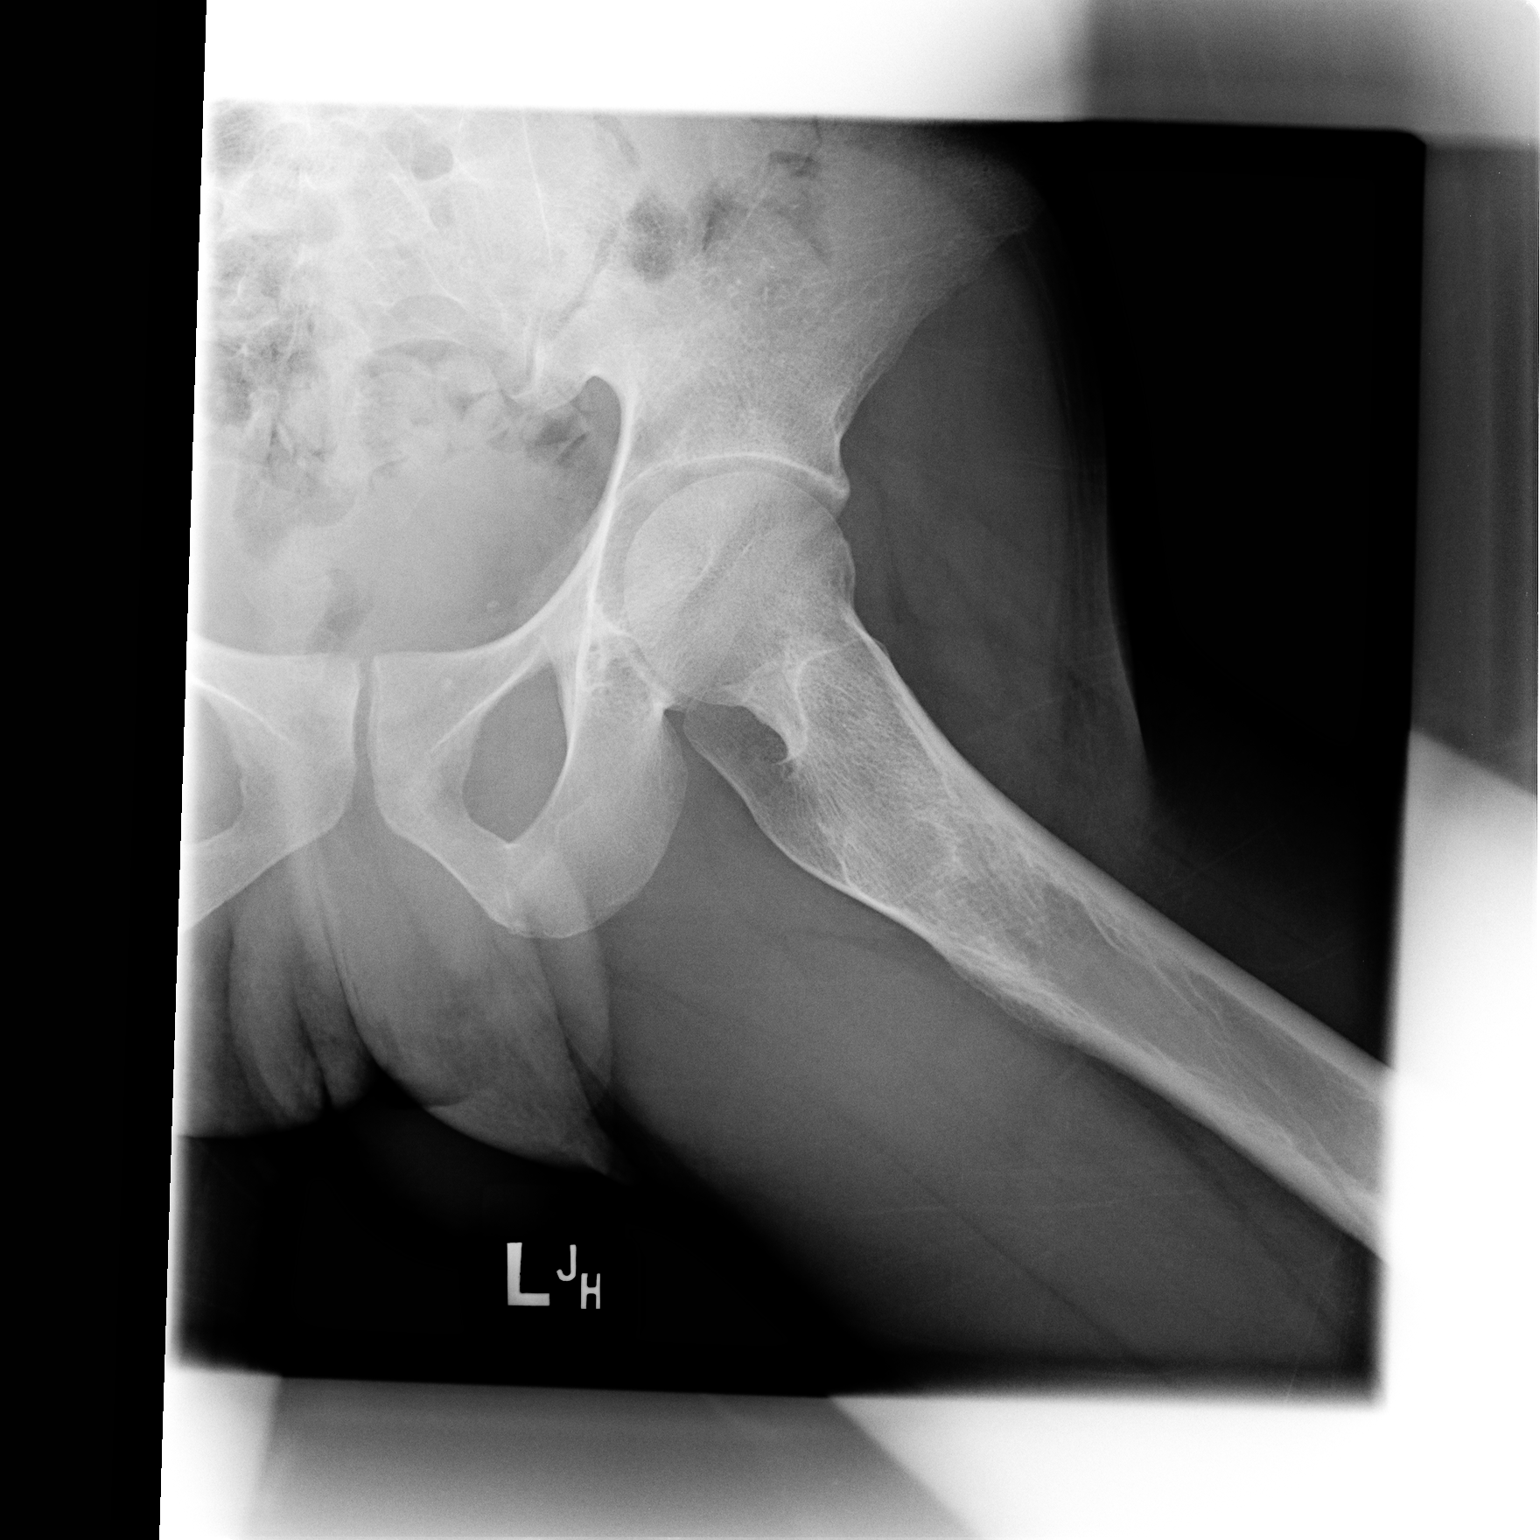

[2 of 2 positions shown; findings below may reference images not displayed]

FINDINGS: No acute bony or joint abnormality is identified.  No
evidence of avascular necrosis of the femoral head is seen.  Bone
infarct in the proximal left femur seen on prior MRI is subtle on
this exam.
IMPRESSION: No acute abnormality.

## 2013-05-21 ENCOUNTER — Encounter: Payer: Self-pay | Admitting: Primary Care

## 2013-05-21 ENCOUNTER — Ambulatory Visit (INDEPENDENT_AMBULATORY_CARE_PROVIDER_SITE_OTHER): Payer: Medicare Other | Admitting: Primary Care

## 2013-05-21 VITALS — BP 113/63 | HR 74 | Temp 98.0°F | Resp 14 | Ht 71.0 in | Wt 149.0 lb

## 2013-05-21 DIAGNOSIS — D571 Sickle-cell disease without crisis: Secondary | ICD-10-CM | POA: Diagnosis not present

## 2013-05-21 DIAGNOSIS — G894 Chronic pain syndrome: Secondary | ICD-10-CM | POA: Diagnosis not present

## 2013-05-21 MED ORDER — HYDROXYUREA 500 MG PO CAPS: 1000.0000 mg | ORAL_CAPSULE | Freq: Two times a day (BID) | ORAL | Status: DC

## 2013-05-21 MED ORDER — OXYCODONE-ACETAMINOPHEN 10-325 MG PO TABS: 1.0000 | ORAL_TABLET | Freq: Four times a day (QID) | ORAL | Status: DC | PRN

## 2013-05-21 NOTE — Progress Notes (Deleted)
Patient ID: Athel Merriweather, male   DOB: [birth year], 32 y.o.   MRN: [id number] SICKLE CELL SERVICE PROGRESS NOTE PCP: Marthann Schiller, MD 05/21/2013   Chief Complaint  Patient presents with  . Follow-up    medication refill    Subjective: Mr. Kliebert is a 32 year old African American male with SS genotype SCD. He has no complaints or concerns. No pain today 0/10 . States typically his pain is in his lower back. He is compliant with his maintance medications of folic acid and hyroxurea. He's asking for a refill on Percocet and hydroxurea. He refuses the Flu shot than shows me a docomenmtary on a lady getting dystonia 10 days after taking     @ROS @   No Known Allergies   Outpatient Encounter Prescriptions as of 05/21/2013  Medication Sig Dispense Refill  . cholecalciferol (VITAMIN D) 1000 UNITS tablet Take 1,000 Units by mouth daily.      . folic acid (FOLVITE) 400 MCG tablet Take 400 mcg by mouth daily.      . hydroxyurea (HYDREA) 500 MG capsule Take 1,000 mg by mouth 2 (two) times daily. May take with food to minimize GI side effects.      Marland Kitchen oxyCODONE-acetaminophen (PERCOCET) 10-325 MG per tablet Take 1 tablet by mouth every 6 (six) hours as needed for pain.  90 tablet  0   No facility-administered encounter medications on file as of 05/21/2013.     Physical Exam   Filed Vitals:   05/21/13 1343  BP: 113/63  Pulse: 74  Temp: 98 F (36.7 C)  Resp: 14    General: Alert, awake, oriented x3, in no acute distress.  HEENT: [place]/AT PEERL, EOMI Neck: Trachea midline,  no masses, no thyromegal,y no JVD, no carotid bruit OROPHARYNX:  Moist, No exudate/ erythema/lesions.  Heart: Regular rate and rhythm, without murmurs, rubs, gallops, PMI non-displaced, no heaves or thrills on palpation.  Lungs: Clear to auscultation, no wheezing or rhonchi noted. No increased vocal fremitus resonant to percussion  Abdomen: Soft, nontender, nondistended, positive bowel sounds, no masses no  hepatosplenomegaly noted..  Neuro: No focal neurological deficits noted cranial nerves II through XII grossly intact. DTRs 2+ bilaterally upper and lower extremities. Strength 5 out of 5 in bilateral upper and lower extremities. Musculoskeletal: No warm swelling or erythema around joints, no spinal tenderness noted. Psychiatric: Patient alert and oriented x3, good insight and cognition, good recent to remote recall. Lymph node survey: No cervical axillary or inguinal lymphadenopathy noted.      Assessment/Plan:

## 2013-05-21 NOTE — Progress Notes (Signed)
Patient ID: [name], male   DOB: March 22, 1981, 32 y.o.   MRN: [id number] SICKLE CELL SERVICE PROGRESS NOTE  Account Test [id number] DOB: 08/15/1966 DOA: (Not on file) PCP: Marthann Schiller, MD 05/21/2013   Chief Complaint  Patient presents with  . Follow-up    medication refill    Subjective: [name] is a 32 year old African American male with SS genotype SCD. He has no complaints or concerns. No pain today 0/10 . States typically his pain is in his lower back. He is compliant with his maintance medications of folic acid and hyroxurea. He's asking for a refill on Percocet and hydroxurea. He refuses the Flu shot than shows me a docomenmtary on a woman who had the flu shot 10 days later developed dystonia.    Review of Systems  Constitutional: Negative.   HENT: Negative.   Eyes: Negative.   Respiratory: Negative.   Cardiovascular: Negative.   Gastrointestinal: Negative.   Genitourinary: Negative.   Musculoskeletal: Negative.   Skin: Negative.   Neurological: Negative.   Endo/Heme/Allergies: Negative.   Psychiatric/Behavioral: Negative.    No Known Allergies   Outpatient Encounter Prescriptions as of 05/21/2013  Medication Sig Dispense Refill  . cholecalciferol (VITAMIN D) 1000 UNITS tablet Take 1,000 Units by mouth daily.      . folic acid (FOLVITE) 400 MCG tablet Take 400 mcg by mouth daily.      . hydroxyurea (HYDREA) 500 MG capsule Take 2 capsules (1,000 mg total) by mouth 2 (two) times daily. May take with food to minimize GI side effects.  120 capsule  3  . hydroxyurea (HYDREA) 500 MG capsule Take 2 capsules (1,000 mg total) by mouth 2 (two) times daily. May take with food to minimize GI side effects.  120 capsule  3  . oxyCODONE-acetaminophen (PERCOCET) 10-325 MG per tablet Take 1 tablet by mouth every 6 (six) hours as needed for pain.  90 tablet  0  . oxyCODONE-acetaminophen (PERCOCET) 10-325 MG per tablet Take 1 tablet by mouth every 6 (six) hours as needed for  pain.  90 tablet  0  . oxyCODONE-acetaminophen (PERCOCET) 10-325 MG per tablet Take 1 tablet by mouth every 6 (six) hours as needed for pain.  90 tablet  0  . [DISCONTINUED] hydroxyurea (HYDREA) 500 MG capsule Take 1,000 mg by mouth 2 (two) times daily. May take with food to minimize GI side effects.      . [DISCONTINUED] hydroxyurea (HYDREA) 500 MG capsule Take 2 capsules (1,000 mg total) by mouth 2 (two) times daily. May take with food to minimize GI side effects.  120 capsule  3  . [DISCONTINUED] hydroxyurea (HYDREA) 500 MG capsule Take 2 capsules (1,000 mg total) by mouth 2 (two) times daily. May take with food to minimize GI side effects.  120 capsule  3  . [DISCONTINUED] oxyCODONE-acetaminophen (PERCOCET) 10-325 MG per tablet Take 1 tablet by mouth every 6 (six) hours as needed for pain.  90 tablet  0  . [DISCONTINUED] oxyCODONE-acetaminophen (PERCOCET) 10-325 MG per tablet Take 1 tablet by mouth every 6 (six) hours as needed for pain.  90 tablet  0   No facility-administered encounter medications on file as of 05/21/2013.   Physical Exam   Filed Vitals:   05/21/13 1343  BP: 113/63  Pulse: 74  Temp: 98 F (36.7 C)  Resp: 14    General: Alert, awake, oriented x3, in no acute distress.  HEENT: [place]/AT PEERL, EOMI, right ear cerumen  Neck: Trachea midline,  no masses, no thyromegal,y no JVD, no carotid bruit OROPHARYNX:  Moist, No exudate/ erythema/lesions.  Heart: Regular rate and rhythm, without murmurs, rubs, gallops, PMI non-displaced, no heaves or thrills on palpation.  Lungs: Clear to auscultation, no wheezing or rhonchi noted. No increased vocal fremitus resonant to percussion  Abdomen: Soft, nontender, nondistended, positive bowel sounds, no masses no hepatosplenomegaly noted..  Neuro: No focal neurological deficits noted cranial nerves II through XII grossly intact. DTRs 2+ bilaterally upper and lower extremities. Strength 5 out of 5 in bilateral upper and lower  extremities. Musculoskeletal: No warm swelling or erythema around joints, no spinal tenderness noted. Psychiatric: Patient alert and oriented x3, good insight and cognition, good recent to remote recall. Lymph node survey: No cervical axillary or inguinal lymphadenopathy noted.    Assessment/Plan: Hgb SS: stable at this time he will be continue maintenance medication of hydroxurea and folic acid Priapism: educated pt on this can be contributed to his SCD. Than pt realized he had several episodes which was self limited. Unclear of the length of time but definitely expressed painful event.  Acute on Chronic pain: Provided a prescription for Percocet 10/325 1 Q hrs prn pain    Health Maintenance: Per pt had an ophthalmologist appt approx 2 weeks ago  Refused flu shot    More than 50 % time was education on SCD    Gwinda Passe, [facility]

## 2013-06-17 ENCOUNTER — Telehealth: Payer: Self-pay | Admitting: Internal Medicine

## 2013-06-18 ENCOUNTER — Other Ambulatory Visit: Payer: Self-pay | Admitting: Internal Medicine

## 2013-06-18 DIAGNOSIS — D571 Sickle-cell disease without crisis: Secondary | ICD-10-CM

## 2013-06-18 DIAGNOSIS — G894 Chronic pain syndrome: Secondary | ICD-10-CM

## 2013-06-18 MED ORDER — OXYCODONE-ACETAMINOPHEN 10-325 MG PO TABS: 1.0000 | ORAL_TABLET | Freq: Four times a day (QID) | ORAL | Status: DC | PRN

## 2013-06-18 NOTE — Progress Notes (Signed)
Prescription issued for Percocet 10/325 mg # 90 pills to be taken 1 tab every 4 hours as needed for pain.

## 2013-07-18 ENCOUNTER — Telehealth: Payer: Self-pay | Admitting: Internal Medicine

## 2013-07-19 ENCOUNTER — Other Ambulatory Visit: Payer: Self-pay | Admitting: Internal Medicine

## 2013-07-19 DIAGNOSIS — D571 Sickle-cell disease without crisis: Secondary | ICD-10-CM

## 2013-07-19 DIAGNOSIS — G894 Chronic pain syndrome: Secondary | ICD-10-CM

## 2013-07-19 MED ORDER — OXYCODONE-ACETAMINOPHEN 10-325 MG PO TABS: 1.0000 | ORAL_TABLET | Freq: Four times a day (QID) | ORAL | Status: DC | PRN

## 2013-07-19 NOTE — Progress Notes (Unsigned)
Prescription given for Percocet 10/325 mg #90 tabs.

## 2013-08-21 ENCOUNTER — Telehealth: Payer: Self-pay | Admitting: Internal Medicine

## 2013-08-21 ENCOUNTER — Encounter (HOSPITAL_COMMUNITY): Payer: Self-pay | Admitting: Hematology

## 2013-08-21 ENCOUNTER — Inpatient Hospital Stay (HOSPITAL_COMMUNITY)
Admission: AD | Admit: 2013-08-21 | Discharge: 2013-08-25 | DRG: 812 | Disposition: A | Discharge status: Home / Self Care | Payer: Medicare Other | Attending: Internal Medicine | Admitting: Internal Medicine

## 2013-08-21 ENCOUNTER — Telehealth (HOSPITAL_COMMUNITY): Payer: Self-pay | Admitting: Hematology

## 2013-08-21 DIAGNOSIS — R52 Pain, unspecified: Secondary | ICD-10-CM | POA: Diagnosis not present

## 2013-08-21 DIAGNOSIS — R1110 Vomiting, unspecified: Secondary | ICD-10-CM | POA: Diagnosis present

## 2013-08-21 DIAGNOSIS — D571 Sickle-cell disease without crisis: Secondary | ICD-10-CM

## 2013-08-21 DIAGNOSIS — Z681 Body mass index (BMI) 19 or less, adult: Secondary | ICD-10-CM

## 2013-08-21 DIAGNOSIS — R82998 Other abnormal findings in urine: Secondary | ICD-10-CM | POA: Diagnosis present

## 2013-08-21 DIAGNOSIS — D5700 Hb-SS disease with crisis, unspecified: Principal | ICD-10-CM | POA: Diagnosis present

## 2013-08-21 DIAGNOSIS — M549 Dorsalgia, unspecified: Secondary | ICD-10-CM | POA: Diagnosis present

## 2013-08-21 DIAGNOSIS — D72829 Elevated white blood cell count, unspecified: Secondary | ICD-10-CM | POA: Diagnosis not present

## 2013-08-21 DIAGNOSIS — M79609 Pain in unspecified limb: Secondary | ICD-10-CM | POA: Diagnosis not present

## 2013-08-21 DIAGNOSIS — Z832 Family history of diseases of the blood and blood-forming organs and certain disorders involving the immune mechanism: Secondary | ICD-10-CM

## 2013-08-21 DIAGNOSIS — G894 Chronic pain syndrome: Secondary | ICD-10-CM

## 2013-08-21 LAB — COMPREHENSIVE METABOLIC PANEL
ALT: 13 U/L (ref 0–53)
AST: 26 U/L (ref 0–37)
Albumin: 4.5 g/dL (ref 3.5–5.2)
Alkaline Phosphatase: 73 U/L (ref 39–117)
BUN: 9 mg/dL (ref 6–23)
CALCIUM: 9.6 mg/dL (ref 8.4–10.5)
CHLORIDE: 102 meq/L (ref 96–112)
CO2: 27 meq/L (ref 19–32)
CREATININE: 0.86 mg/dL (ref 0.50–1.35)
GFR calc non Af Amer: >90 mL/min (ref >90)
GLUCOSE: 90 mg/dL (ref 70–99)
Potassium: 4 mEq/L (ref 3.7–5.3)
Sodium: 139 mEq/L (ref 137–147)
Total Bilirubin: 2.9 mg/dL — ABNORMAL HIGH (ref 0.3–1.2)
Total Protein: 8.3 g/dL (ref 6.0–8.3)

## 2013-08-21 LAB — CBC WITH DIFFERENTIAL/PLATELET
BASOS PCT: 0 % (ref 0–1)
Basophils Absolute: 0 10*3/uL (ref 0.0–0.1)
EOS PCT: 2 % (ref 0–5)
Eosinophils Absolute: 0.2 10*3/uL (ref 0.0–0.7)
HEMATOCRIT: 25.4 % — AB (ref 39.0–52.0)
Hemoglobin: 9.2 g/dL — ABNORMAL LOW (ref 13.0–17.0)
Lymphocytes Relative: 26 % (ref 12–46)
Lymphs Abs: 2.3 10*3/uL (ref 0.7–4.0)
MCH: 32.1 pg (ref 26.0–34.0)
MCHC: 36.2 g/dL — AB (ref 30.0–36.0)
MCV: 88.5 fL (ref 78.0–100.0)
MONO ABS: 0.6 10*3/uL (ref 0.1–1.0)
Monocytes Relative: 7 % (ref 3–12)
Neutro Abs: 5.8 10*3/uL (ref 1.7–7.7)
Neutrophils Relative %: 65 % (ref 43–77)
Platelets: 337 10*3/uL (ref 150–400)
RBC: 2.87 MIL/uL — ABNORMAL LOW (ref 4.22–5.81)
RDW: 18.2 % — AB (ref 11.5–15.5)
WBC: 8.9 10*3/uL (ref 4.0–10.5)

## 2013-08-21 LAB — RETICULOCYTES
RBC.: 2.87 MIL/uL — ABNORMAL LOW (ref 4.22–5.81)
Retic Count, Absolute: 324.3 10*3/uL — ABNORMAL HIGH (ref 19.0–186.0)
Retic Ct Pct: 15 % — ABNORMAL HIGH (ref 0.4–3.1)

## 2013-08-21 MED ORDER — VITAMIN D3 25 MCG (1000 UNIT) PO TABS
1000.0000 [IU] | ORAL_TABLET | Freq: Every day | ORAL | Status: DC
  Administered 2013-08-22 – 2013-08-25 (×4): 1000 [IU] via ORAL
  Filled 2013-08-21 (×4): qty 1

## 2013-08-21 MED ORDER — DIPHENHYDRAMINE HCL 12.5 MG/5ML PO ELIX: 12.5000 mg | ORAL_SOLUTION | Freq: Four times a day (QID) | ORAL | Status: DC | PRN

## 2013-08-21 MED ORDER — ONDANSETRON HCL 4 MG/2ML IJ SOLN
4.0000 mg | INTRAMUSCULAR | Status: DC | PRN
  Administered 2013-08-21 – 2013-08-22 (×3): 4 mg via INTRAVENOUS
  Filled 2013-08-21 (×5): qty 2

## 2013-08-21 MED ORDER — HYDROMORPHONE HCL PF 2 MG/ML IJ SOLN
2.0000 mg | Freq: Once | INTRAMUSCULAR | Status: AC
  Administered 2013-08-21: 2 mg via INTRAVENOUS
  Filled 2013-08-21: qty 1

## 2013-08-21 MED ORDER — HYDROMORPHONE 0.3 MG/ML IV SOLN
INTRAVENOUS | Status: DC
  Administered 2013-08-21: 16:00:00 via INTRAVENOUS
  Administered 2013-08-21: 7.82 mg via INTRAVENOUS
  Administered 2013-08-21: 6.98 mg via INTRAVENOUS
  Administered 2013-08-21 (×3): via INTRAVENOUS
  Administered 2013-08-22: 7.2 mg via INTRAVENOUS
  Administered 2013-08-22 (×2): via INTRAVENOUS
  Administered 2013-08-22: 7.5 mg via INTRAVENOUS
  Administered 2013-08-22: 2.21 mg via INTRAVENOUS
  Administered 2013-08-22: 7.1 mg via INTRAVENOUS
  Administered 2013-08-22: 20:00:00 via INTRAVENOUS
  Administered 2013-08-22: 4.39 mg via INTRAVENOUS
  Administered 2013-08-23: 0.8 mg via INTRAVENOUS
  Administered 2013-08-23: 1.6 mg via INTRAVENOUS
  Administered 2013-08-23: 3.19 mg via INTRAVENOUS
  Filled 2013-08-21 (×7): qty 25

## 2013-08-21 MED ORDER — FOLIC ACID 1 MG PO TABS
1.0000 mg | ORAL_TABLET | Freq: Every day | ORAL | Status: DC
  Administered 2013-08-22 – 2013-08-25 (×4): 1 mg via ORAL
  Filled 2013-08-21 (×4): qty 1

## 2013-08-21 MED ORDER — DEXTROSE-NACL 5-0.45 % IV SOLN
INTRAVENOUS | Status: DC
  Administered 2013-08-21 – 2013-08-23 (×5): via INTRAVENOUS

## 2013-08-21 MED ORDER — DIPHENHYDRAMINE HCL 50 MG/ML IJ SOLN
12.5000 mg | Freq: Four times a day (QID) | INTRAMUSCULAR | Status: DC | PRN
  Administered 2013-08-22 (×2): 12.5 mg via INTRAVENOUS
  Filled 2013-08-21 (×2): qty 1

## 2013-08-21 MED ORDER — DIPHENHYDRAMINE HCL 25 MG PO CAPS
25.0000 mg | ORAL_CAPSULE | ORAL | Status: DC | PRN
Start: 2013-08-21 — End: 2013-08-25
  Filled 2013-08-21: qty 2

## 2013-08-21 MED ORDER — KETOROLAC TROMETHAMINE 30 MG/ML IJ SOLN
30.0000 mg | Freq: Four times a day (QID) | INTRAMUSCULAR | Status: DC
  Administered 2013-08-21 – 2013-08-25 (×14): 30 mg via INTRAVENOUS
  Filled 2013-08-21 (×27): qty 1

## 2013-08-21 MED ORDER — ONDANSETRON HCL 4 MG/2ML IJ SOLN
4.0000 mg | Freq: Four times a day (QID) | INTRAMUSCULAR | Status: DC | PRN
  Administered 2013-08-25: 4 mg via INTRAVENOUS

## 2013-08-21 MED ORDER — ONDANSETRON HCL 4 MG PO TABS: 4.0000 mg | ORAL_TABLET | ORAL | Status: DC | PRN

## 2013-08-21 MED ORDER — SODIUM CHLORIDE 0.9 % IV SOLN
25.0000 mg | INTRAVENOUS | Status: DC | PRN
  Filled 2013-08-21 (×2): qty 0.5

## 2013-08-21 MED ORDER — NALOXONE HCL 0.4 MG/ML IJ SOLN: 0.4000 mg | INTRAMUSCULAR | Status: DC | PRN

## 2013-08-21 MED ORDER — HYDROXYUREA 500 MG PO CAPS
1000.0000 mg | ORAL_CAPSULE | Freq: Two times a day (BID) | ORAL | Status: DC
Start: 2013-08-21 — End: 2013-08-25
  Administered 2013-08-21 – 2013-08-25 (×8): 1000 mg via ORAL
  Filled 2013-08-21 (×10): qty 2

## 2013-08-21 MED ORDER — SODIUM CHLORIDE 0.9 % IJ SOLN: 9.0000 mL | INTRAMUSCULAR | Status: DC | PRN

## 2013-08-21 MED ORDER — OXYCODONE-ACETAMINOPHEN 10-325 MG PO TABS: 1.0000 | ORAL_TABLET | Freq: Four times a day (QID) | ORAL | Status: DC | PRN

## 2013-08-21 NOTE — Telephone Encounter (Signed)
Prescription for percocet placed in folder as patient is being admitted.  He can pick up when he is discharged.

## 2013-08-21 NOTE — Telephone Encounter (Signed)
Prescription issued for Percocet 10/325 mg #90.

## 2013-08-21 NOTE — Telephone Encounter (Signed)
Patient walked in to office C/O back and rib pain that is 8/10. Per patient he took 1/2 of percocet around 9:00a.m.  Denies fever, chest pain, nausea/vomiting, or abdominal pain.  Advised I would speak with Dr. Jonelle Sidle who is covering the day hospital.  Patient verbalizes understanding.

## 2013-08-21 NOTE — H&P (Addendum)
Thedford Bunton is an 33 y.o. male.   Chief Complaint: Pain in limbs  HPI: A 33 yo man with known hx of Sickle cell disease here with Sickle cell painful crisis. Has pain in his back and limbs at 7/10. No fever, no chills, no NVD. Has not been in hospital for a while. Wants to be seen in the Day Hospital.  Past Medical History  Diagnosis Date  . Sickle cell anemia     Past Surgical History  Procedure Laterality Date  . Cholecystectomy      Family History  Problem Relation Age of Onset  . Sickle cell anemia Mother   . Sickle cell trait Father   . Sickle cell trait Sister   . Sickle cell trait Brother   . Cancer Maternal Grandmother    Social History:  reports that he has never smoked. He does not have any smokeless tobacco history on file. He reports that he does not drink alcohol or use illicit drugs.  Allergies: No Known Allergies  Medications Prior to Admission  Medication Sig Dispense Refill  . cholecalciferol (VITAMIN D) 1000 UNITS tablet Take 1,000 Units by mouth daily.      . folic acid (FOLVITE) 213 MCG tablet Take 400 mcg by mouth daily.      . hydroxyurea (HYDREA) 500 MG capsule Take 2 capsules (1,000 mg total) by mouth 2 (two) times daily. May take with food to minimize GI side effects.  120 capsule  3  . oxyCODONE-acetaminophen (PERCOCET) 10-325 MG per tablet Take 1 tablet by mouth every 6 (six) hours as needed for pain.  90 tablet  0  . hydroxyurea (HYDREA) 500 MG capsule Take 2 capsules (1,000 mg total) by mouth 2 (two) times daily. May take with food to minimize GI side effects.  120 capsule  3    No results found for this or any previous visit (from the past 48 hour(s)). No results found.  Review of Systems  Constitutional: Negative.   Eyes: Negative.   Respiratory: Negative.   Cardiovascular: Negative.   Gastrointestinal: Negative.   Genitourinary: Negative.   Musculoskeletal: Positive for back pain, joint pain, myalgias and neck pain.  Skin: Negative.    Neurological: Positive for headaches.  Endo/Heme/Allergies: Negative.   Psychiatric/Behavioral: Negative.     Blood pressure 113/58, pulse 92, temperature 98.3 F (36.8 C), temperature source Oral, resp. rate 20, height 6\' 1"  (1.854 m), weight 67.586 kg (149 lb), SpO2 99.00%. Physical Exam  Constitutional: He is oriented to person, place, and time. He appears well-developed and well-nourished.  HENT:  Head: Normocephalic and atraumatic.  Right Ear: External ear normal.  Left Ear: External ear normal.  Mouth/Throat: Oropharynx is clear and moist.  Eyes: Conjunctivae and EOM are normal. Pupils are equal, round, and reactive to light.  Neck: Normal range of motion. Neck supple.  Cardiovascular: Normal rate, regular rhythm, normal heart sounds and intact distal pulses.   Respiratory: Effort normal and breath sounds normal.  GI: Soft. Bowel sounds are normal.  Musculoskeletal: Normal range of motion. He exhibits tenderness.  Neurological: He is alert and oriented to person, place, and time. He has normal reflexes.  Skin: Skin is warm and dry.  Psychiatric: He has a normal mood and affect. His behavior is normal. Judgment and thought content normal.     Assessment/Plan A 33 yo man here with sickle cell painful crisis.  # Sickle Cell Painful Crisis: Will admit to the day hospital and treat with IV dilaudid. Start  with rapid re-dosing and then PCa pump when available. Hopefully we can discharge him home after treatment.  # Sickle cell anemia: Will check H/H.  Kimiyo Carmicheal,LAWAL 08/21/2013, 12:24 PM   Patient was still having major symptoms at close of clinic yesterday and was transferred for in patient treatment. Pain was still at 9/10 then.  Barbette Merino

## 2013-08-21 NOTE — Telephone Encounter (Signed)
Spoke with Dr. Jonelle Sidle and it is ok for patient to come to [facility].  Patient Advised.

## 2013-08-21 NOTE — Progress Notes (Signed)
Patient ID: [name], male   DOB: [birth year], 33 y.o.   MRN: [id number] Report called to Junior.  Patient transported to room 1410.  Family at bedside.

## 2013-08-22 DIAGNOSIS — D5700 Hb-SS disease with crisis, unspecified: Secondary | ICD-10-CM | POA: Diagnosis not present

## 2013-08-22 DIAGNOSIS — D72829 Elevated white blood cell count, unspecified: Secondary | ICD-10-CM | POA: Diagnosis not present

## 2013-08-22 LAB — CBC WITH DIFFERENTIAL/PLATELET
BASOS PCT: 0 % (ref 0–1)
Basophils Absolute: 0 10*3/uL (ref 0.0–0.1)
Eosinophils Absolute: 0.1 10*3/uL (ref 0.0–0.7)
Eosinophils Relative: 0 % (ref 0–5)
HCT: 22.8 % — ABNORMAL LOW (ref 39.0–52.0)
Hemoglobin: 8.3 g/dL — ABNORMAL LOW (ref 13.0–17.0)
Lymphocytes Relative: 21 % (ref 12–46)
Lymphs Abs: 2.8 10*3/uL (ref 0.7–4.0)
MCH: 32.7 pg (ref 26.0–34.0)
MCHC: 36.4 g/dL — AB (ref 30.0–36.0)
MCV: 89.8 fL (ref 78.0–100.0)
MONOS PCT: 8 % (ref 3–12)
Monocytes Absolute: 1.1 10*3/uL — ABNORMAL HIGH (ref 0.1–1.0)
NEUTROS PCT: 71 % (ref 43–77)
Neutro Abs: 9.5 10*3/uL — ABNORMAL HIGH (ref 1.7–7.7)
Platelets: 316 10*3/uL (ref 150–400)
RBC: 2.54 MIL/uL — AB (ref 4.22–5.81)
RDW: 18.7 % — ABNORMAL HIGH (ref 11.5–15.5)
WBC: 13.4 10*3/uL — ABNORMAL HIGH (ref 4.0–10.5)

## 2013-08-22 LAB — COMPREHENSIVE METABOLIC PANEL
ALBUMIN: 3.9 g/dL (ref 3.5–5.2)
ALK PHOS: 50 U/L (ref 39–117)
ALT: 11 U/L (ref 0–53)
AST: 27 U/L (ref 0–37)
BILIRUBIN TOTAL: 2 mg/dL — AB (ref 0.3–1.2)
BUN: 6 mg/dL (ref 6–23)
CO2: 28 mEq/L (ref 19–32)
Calcium: 8.9 mg/dL (ref 8.4–10.5)
Chloride: 100 mEq/L (ref 96–112)
Creatinine, Ser: 0.83 mg/dL (ref 0.50–1.35)
GFR calc Af Amer: >90 mL/min (ref >90)
GFR calc non Af Amer: >90 mL/min (ref >90)
Glucose, Bld: 118 mg/dL — ABNORMAL HIGH (ref 70–99)
POTASSIUM: 3.8 meq/L (ref 3.7–5.3)
SODIUM: 137 meq/L (ref 137–147)
TOTAL PROTEIN: 7.5 g/dL (ref 6.0–8.3)

## 2013-08-22 MED ORDER — ENOXAPARIN SODIUM 40 MG/0.4ML ~~LOC~~ SOLN
40.0000 mg | SUBCUTANEOUS | Status: DC
  Administered 2013-08-23 – 2013-08-24 (×2): 40 mg via SUBCUTANEOUS
  Filled 2013-08-22 (×4): qty 0.4

## 2013-08-22 MED ORDER — OXYCODONE-ACETAMINOPHEN 5-325 MG PO TABS
1.0000 | ORAL_TABLET | ORAL | Status: DC
  Administered 2013-08-22 – 2013-08-25 (×12): 1 via ORAL
  Filled 2013-08-22 (×12): qty 1

## 2013-08-22 MED ORDER — OXYCODONE HCL 5 MG PO TABS
5.0000 mg | ORAL_TABLET | ORAL | Status: DC
  Administered 2013-08-22 – 2013-08-25 (×15): 5 mg via ORAL
  Filled 2013-08-22 (×16): qty 1

## 2013-08-22 NOTE — Progress Notes (Signed)
Patient ID: [name], male   DOB: August 08, 1981, 33 y.o.   MRN: [id number] Pt has had minimal use on PCA. Will schedule his oral medications and leave PCA for PRN use. Reassess in the morning.

## 2013-08-22 NOTE — Progress Notes (Signed)
INITIAL NUTRITION ASSESSMENT  DOCUMENTATION CODES Per approved criteria  -Underweight   INTERVENTION: Recommend Carn[employer] Instant Breakfast once daily  NUTRITION DIAGNOSIS: Underweight related to decreased oral intake as evidenced by BMI 18.4.   Goal: Pt to meet >/= 90% of their estimated nutrition needs   Monitor:  Total protein/energy intake, labs, weight trends  Reason for Assessment: BMI < 18.5  33 y.o. male  Admitting Dx: <principal problem not specified>  ASSESSMENT: 33 yo man with known hx of Sickle cell disease here with Sickle cell painful crisis. Has pain in his back and limbs at 7/10. No fever, no chills, no NVD  -Pt reported recent decreased appetite/nausea r/t medications. Was unable to quantify when decreased appetite began -Also reported to have lost weight, pt was unable to determine exact amount. Usual weight appears to be around 145-150 lbs per previous medical records -With minimal appetite, ate <25% of breakfast of mostly fruit -Was willing to try Carnation Instant Breakfast to assist with weight maintenence  Height: Ht Readings from Last 1 Encounters:  08/21/13 6\' 1"  (1.854 m)    Weight: Wt Readings from Last 1 Encounters:  08/21/13 139 lb 6.4 oz (63.231 kg)    Ideal Body Weight: 184 lbs  % Ideal Body Weight: 76%  Wt Readings from Last 10 Encounters:  08/21/13 139 lb 6.4 oz (63.231 kg)  05/21/13 149 lb (67.586 kg)  02/18/13 145 lb (65.772 kg)  01/18/13 148 lb (67.132 kg)  08/02/12 150 lb (68.04 kg)    Usual Body Weight: 145-150 lbs  % Usual Body Weight: 95%  BMI:  Body mass index is 18.4 kg/(m^2). Underweight  Estimated Nutritional Needs: Kcal: 1900-2100  Protein: 65-80 gram Fluid: 2200 ml/daily  Skin: WDL  Diet Order: General  EDUCATION NEEDS: -No education needs identified at this time   Intake/Output Summary (Last 24 hours) at 08/22/13 1213 Last data filed at 08/22/13 0656  Gross per 24 hour  Intake 2270.84 ml   Output   1400 ml  Net 870.84 ml    Last BM: 1/07  Labs:   Recent Labs Lab 08/21/13 1237 08/22/13 0559  NA 139 137  K 4.0 3.8  CL 102 100  CO2 27 28  BUN 9 6  CREATININE 0.86 0.83  CALCIUM 9.6 8.9  GLUCOSE 90 118*    CBG (last 3)  No results found for this basename: GLUCAP,  in the last 72 hours  Scheduled Meds: . cholecalciferol  1,000 Units Oral Daily  . enoxaparin (LOVENOX) injection  40 mg Subcutaneous Q24H  . folic acid  1 mg Oral Daily  . HYDROmorphone PCA 0.3 mg/mL   Intravenous Q4H  . hydroxyurea  1,000 mg Oral BID  . ketorolac  30 mg Intravenous Q6H    Continuous Infusions: . dextrose 5 % and 0.45% NaCl 125 mL/hr at 08/22/13 3419    Past Medical History  Diagnosis Date  . Sickle cell anemia     Past Surgical History  Procedure Laterality Date  . Cholecystectomy      Niarada [place] Clinical Dietitian [phone]

## 2013-08-22 NOTE — Progress Notes (Signed)
[place] CELL SERVICE PROGRESS NOTE  [name] YQI:[id number] DOB: Sep 22, 1980 DOA: 08/21/2013 PCP: Adrinne Sze A., MD  Assessment/Plan: Active Problems:   Sickle cell anemia   Sickle cell anemia with crisis  1. Hb SS with crisis: Pt admitted with vaso-occlusive episode. He is very reluctant to use the PCA as he voices fear of becoming dependent on narcotics. I have explained that a short course of narcotic analgesics is unlikely to result in depnedence. I will continue PCA for now however if use is still scant will schedule oral medications and use PCA as a PRN. Continue Toradol and IVF. 2. Leukocytosis: Likely a reflection of crisis as patient has no sequelae of infection and is otherwise stable except for pain. 3. SCD: Continue Hydrea and Folic acid.  Code Status: Full Code Family Communication: N/A. Girlfriend in room Disposition Plan: Not yet ready for discharge  [place].  Pager [phone]. If 7PM-7AM, please contact night-coverage.  08/22/2013, 4:55 PM  LOS: 1 day   Brief narrative: Pt with Hb SS who is rarely hospitalized presented to [facility] yesterday secondary to pain. He was treated in the [facility] and continued to have significant pain in addition to emesis. He was transferred to the hospital for management of Acute Vaso-occlusive episode and hydration.   Consultants:  None  Procedures:  None  Antibiotics:  none  HPI/Subjective: Pt states that his pain is still at a 8/10. However, he is reluctant to use the PCA for fear of dependence on narcotics. He has not had a BM today. He rates his pain as 8/10 mostly localized to his low back.  Objective: Filed Vitals:   08/22/13 0631 08/22/13 1239 08/22/13 1247 08/22/13 1424  BP: 108/50   112/63  Pulse: 83   74  Temp: 98.6 F (37 C)   98 F (36.7 C)  TempSrc: Oral   Oral  Resp: 16 12 15 16   Height:      Weight:      SpO2: 97% 96% 99% 98%   Weight change:   Intake/Output Summary (Last 24  hours) at 08/22/13 1655 Last data filed at 08/22/13 0656  Gross per 24 hour  Intake 2270.84 ml  Output   1400 ml  Net 870.84 ml    General: Alert, awake, oriented x3, in no acute distress.  HEENT: [place]/AT PEERL, EOMI, anicteric. Neck: Trachea midline,  no masses, no thyromegal,y no JVD, no carotid bruit OROPHARYNX:  Moist, No exudate/ erythema/lesions.  Heart: Regular rate and rhythm, without murmurs, rubs, gallops, PMI non-displaced, no heaves or thrills on palpation.  Lungs: Clear to auscultation, no wheezing or rhonchi noted. No increased vocal fremitus resonant to percussion  Abdomen: Soft, nontender, nondistended, positive bowel sounds, no masses no hepatosplenomegaly noted.  Neuro: No focal neurological deficits noted cranial nerves II through XII grossly intact. DTRs 2+ bilaterally upper and lower extremities. Strength normal in bilateral upper and lower extremities. Musculoskeletal: No warm swelling or erythema around joints, no spinal tenderness noted. Psychiatric: Patient alert and oriented x3, good insight and cognition, good recent to remote recall.  Data Reviewed: Basic Metabolic Panel:  Recent Labs Lab 08/21/13 1237 08/22/13 0559  NA 139 137  K 4.0 3.8  CL 102 100  CO2 27 28  GLUCOSE 90 118*  BUN 9 6  CREATININE 0.86 0.83  CALCIUM 9.6 8.9   Liver Function Tests:  Recent Labs Lab 08/21/13 1237 08/22/13 0559  AST 26 27  ALT 13 11  ALKPHOS 73 50  BILITOT 2.9*  2.0*  PROT 8.3 7.5  ALBUMIN 4.5 3.9   No results found for this basename: LIPASE, AMYLASE,  in the last 168 hours No results found for this basename: AMMONIA,  in the last 168 hours CBC:  Recent Labs Lab 08/21/13 1237 08/22/13 0559  WBC 8.9 13.4*  NEUTROABS 5.8 9.5*  HGB 9.2* 8.3*  HCT 25.4* 22.8*  MCV 88.5 89.8  PLT 337 316   Cardiac Enzymes: No results found for this basename: CKTOTAL, CKMB, CKMBINDEX, TROPONINI,  in the last 168 hours BNP (last 3 results) No results found for this  basename: PROBNP,  in the last 8760 hours CBG: No results found for this basename: GLUCAP,  in the last 168 hours  No results found for this or any previous visit (from the past 240 hour(s)).   Studies: No results found.  Scheduled Meds: . cholecalciferol  1,000 Units Oral Daily  . enoxaparin (LOVENOX) injection  40 mg Subcutaneous Q24H  . folic acid  1 mg Oral Daily  . HYDROmorphone PCA 0.3 mg/mL   Intravenous Q4H  . hydroxyurea  1,000 mg Oral BID  . ketorolac  30 mg Intravenous Q6H   Continuous Infusions: . dextrose 5 % and 0.45% NaCl 125 mL/hr at 08/22/13 1313    Active Problems:   Sickle cell anemia   Sickle cell anemia with crisis

## 2013-08-22 NOTE — Care Management Note (Signed)
    Page 1 of 1   08/22/2013     2:01:55 PM   CARE MANAGEMENT NOTE 08/22/2013  Patient:  [name],[name]   Account Number:  [id number]  Date Initiated:  08/22/2013  Documentation initiated by:  Dessa Phi  Subjective/Objective Assessment:   33 Y/O M ADMITTED W/SICKLE CELL CRISIS.KM:QKMMNO CELL ANEMIA.     Action/Plan:   FROM HOME.   Anticipated DC Date:  08/26/2013   Anticipated DC Plan:  [place]  CM consult      Choice offered to / List presented to:             Status of service:  In process, will continue to follow Medicare Important Message given?   (If response is "NO", the following Medicare IM given date fields will be blank) Date Medicare IM given:   Date Additional Medicare IM given:    Discharge Disposition:    Per UR Regulation:  Reviewed for med. necessity/level of care/duration of stay  If discussed at Long Length of Stay Meetings, dates discussed:    Comments:  08/22/13 Narcisa Ganesh RN,BSN NCM [phone] NO ANTICIPATED D/C NEEDS.

## 2013-08-23 DIAGNOSIS — R52 Pain, unspecified: Secondary | ICD-10-CM

## 2013-08-23 DIAGNOSIS — D5700 Hb-SS disease with crisis, unspecified: Secondary | ICD-10-CM | POA: Diagnosis not present

## 2013-08-23 LAB — CBC WITH DIFFERENTIAL/PLATELET
BASOS PCT: 0 % (ref 0–1)
Basophils Absolute: 0 10*3/uL (ref 0.0–0.1)
EOS PCT: 1 % (ref 0–5)
Eosinophils Absolute: 0.1 10*3/uL (ref 0.0–0.7)
HCT: 20.7 % — ABNORMAL LOW (ref 39.0–52.0)
HEMOGLOBIN: 7.8 g/dL — AB (ref 13.0–17.0)
Lymphocytes Relative: 33 % (ref 12–46)
Lymphs Abs: 2.9 10*3/uL (ref 0.7–4.0)
MCH: 32.9 pg (ref 26.0–34.0)
MCHC: 37.7 g/dL — ABNORMAL HIGH (ref 30.0–36.0)
MCV: 87.3 fL (ref 78.0–100.0)
MONO ABS: 0.5 10*3/uL (ref 0.1–1.0)
Monocytes Relative: 6 % (ref 3–12)
NEUTROS ABS: 5.2 10*3/uL (ref 1.7–7.7)
Neutrophils Relative %: 60 % (ref 43–77)
Platelets: 313 10*3/uL (ref 150–400)
RBC: 2.37 MIL/uL — AB (ref 4.22–5.81)
RDW: 17 % — ABNORMAL HIGH (ref 11.5–15.5)
WBC: 8.7 10*3/uL (ref 4.0–10.5)

## 2013-08-23 MED ORDER — POLYETHYLENE GLYCOL 3350 17 G PO PACK
17.0000 g | PACK | Freq: Every day | ORAL | Status: DC
  Filled 2013-08-23 (×3): qty 1

## 2013-08-23 MED ORDER — HYDROMORPHONE HCL PF 1 MG/ML IJ SOLN: 1.0000 mg | INTRAMUSCULAR | Status: DC | PRN

## 2013-08-23 MED ORDER — SENNOSIDES-DOCUSATE SODIUM 8.6-50 MG PO TABS
1.0000 | ORAL_TABLET | Freq: Two times a day (BID) | ORAL | Status: DC
  Administered 2013-08-23 – 2013-08-25 (×4): 1 via ORAL
  Filled 2013-08-23 (×5): qty 1

## 2013-08-23 MED ORDER — HYDROMORPHONE HCL PF 1 MG/ML IJ SOLN
0.5000 mg | INTRAMUSCULAR | Status: DC | PRN
  Administered 2013-08-23: 0.5 mg via INTRAVENOUS
  Filled 2013-08-23: qty 1

## 2013-08-23 NOTE — Progress Notes (Signed)
[place] CELL SERVICE PROGRESS NOTE  [name] ZOX:[id number] DOB: 1981/02/19 DOA: 08/21/2013 PCP: Jeannett Dekoning A., MD  Assessment/Plan: Active Problems:   Sickle cell anemia   Sickle cell anemia with crisis  1. Hb SS with crisis: Pt still having pain in the back and legs. He has had better use of PCA but still very reluctant to use.He is very reluctant to use the PCA as he voices fear of becoming dependent on narcotics. I have explained that a short course of narcotic analgesics is unlikely to result in depnedence. He has used 19.59 mg in 24 hours with 25 demands and 25 deliveries. I will continue scheduled oral medications and discontinue PCA and order clinician assisted doses of dilaudid 1-2 mg IV q 2 hours as needed. Continue Toradol and IVF. 2. Leukocytosis: Resolved. Likely a reflection of crisis as patient has no sequelae of infection and is otherwise stable except for pain. 3. SCD: Continue Hydrea and Folic acid.  Code Status: Full Code Family Communication: N/A. Girlfriend in room Disposition Plan: Anticipate discharge in next 24 - 48 hours.  Puneet Selden A.  Pager [phone]. If 7PM-7AM, please contact night-coverage.  08/23/2013, 4:27 PM  LOS: 2 days   Brief narrative: Pt with Hb SS who is rarely hospitalized presented to [facility] yesterday secondary to pain. He was treated in the [facility] and continued to have significant pain in addition to emesis. He was transferred to the hospital for management of Acute Vaso-occlusive episode and hydration.   Consultants:  None  Procedures:  None  Antibiotics:  none  HPI/Subjective: Pt states that his pain is still at a 7/10. However, he is reluctant to use the PCA for fear of dependence on narcotics. He has not had a BM today. He rates his pain as 7/10 mostly localized to his low back.  Objective: Filed Vitals:   08/23/13 0400 08/23/13 0615 08/23/13 0844 08/23/13 1418  BP:  114/48  110/64  Pulse:  83  74   Temp:  98.7 F (37.1 C)  97.9 F (36.6 C)  TempSrc:  Oral  Oral  Resp: 11 16 17 18   Height:      Weight:      SpO2: 99% 100% 99% 97%   Weight change:   Intake/Output Summary (Last 24 hours) at 08/23/13 1627 Last data filed at 08/23/13 1104  Gross per 24 hour  Intake    960 ml  Output    675 ml  Net    285 ml    General: Alert, awake, oriented x3, in no acute distress.  HEENT: [place]/AT PEERL, EOMI, mild icterus. Neck: Trachea midline,  no masses, no thyromegal,y no JVD, no carotid bruit OROPHARYNX:  Moist, No exudate/ erythema/lesions.  Heart: Regular rate and rhythm, without murmurs, rubs, gallops, PMI non-displaced, no heaves or thrills on palpation.  Lungs: Clear to auscultation, no wheezing or rhonchi noted. No increased vocal fremitus resonant to percussion  Abdomen: Soft, nontender, nondistended, positive bowel sounds, no masses no hepatosplenomegaly noted.  Neuro: No focal neurological deficits noted cranial nerves II through XII grossly intact. DTRs 2+ bilaterally upper and lower extremities. Strength normal in bilateral upper and lower extremities. Musculoskeletal: No warm swelling or erythema around joints, no spinal tenderness noted. Psychiatric: Patient alert and oriented x3, good insight and cognition, good recent to remote recall.  Data Reviewed: Basic Metabolic Panel:  Recent Labs Lab 08/21/13 1237 08/22/13 0559  NA 139 137  K 4.0 3.8  CL 102 100  CO2 27 28  GLUCOSE 90 118*  BUN 9 6  CREATININE 0.86 0.83  CALCIUM 9.6 8.9   Liver Function Tests:  Recent Labs Lab 08/21/13 1237 08/22/13 0559  AST 26 27  ALT 13 11  ALKPHOS 73 50  BILITOT 2.9* 2.0*  PROT 8.3 7.5  ALBUMIN 4.5 3.9   No results found for this basename: LIPASE, AMYLASE,  in the last 168 hours No results found for this basename: AMMONIA,  in the last 168 hours CBC:  Recent Labs Lab 08/21/13 1237 08/22/13 0559 08/23/13 0534  WBC 8.9 13.4* 8.7  NEUTROABS 5.8 9.5* 5.2  HGB 9.2*  8.3* 7.8*  HCT 25.4* 22.8* 20.7*  MCV 88.5 89.8 87.3  PLT 337 316 313   Cardiac Enzymes: No results found for this basename: CKTOTAL, CKMB, CKMBINDEX, TROPONINI,  in the last 168 hours BNP (last 3 results) No results found for this basename: PROBNP,  in the last 8760 hours CBG: No results found for this basename: GLUCAP,  in the last 168 hours  No results found for this or any previous visit (from the past 240 hour(s)).   Studies: No results found.  Scheduled Meds: . cholecalciferol  1,000 Units Oral Daily  . enoxaparin (LOVENOX) injection  40 mg Subcutaneous Q24H  . folic acid  1 mg Oral Daily  . hydroxyurea  1,000 mg Oral BID  . ketorolac  30 mg Intravenous Q6H  . oxyCODONE-acetaminophen  1 tablet Oral Q4H   And  . oxyCODONE  5 mg Oral Q4H  . polyethylene glycol  17 g Oral Daily  . senna-docusate  1 tablet Oral BID   Continuous Infusions:    Total time 30 minutes.

## 2013-08-23 NOTE — Clinical Documentation Improvement (Signed)
Possible Clinical Conditions?  Severe Malnutrition   Protein Calorie Malnutrition Severe Protein Calorie Malnutrition Underweight=BMI is 18.4 kg/(m^2) Other Condition Cannot clinically determine  Supporting Information: Nutrition assessment by Hazle Coca, RD at 08/22/2013 12:13 PM  Risk Factors:32 yo man with known hx of Sickle cell disease here with Sickle cell painful crisis. Diagnostics: NUTRITION DIAGNOSIS: Underweight related to decreased oral intake as evidenced by BMI 18.4.   Treatment:INTERVENTION: Recommend Carn[employer] Instant Breakfast once daily  Thank You, Delena Serve, BSN, CCDS Clinical Documentation Specialist:  [phone]  Cell=[phone] [place]- Health Information Management

## 2013-08-24 LAB — CBC WITH DIFFERENTIAL/PLATELET
BASOS ABS: 0 10*3/uL (ref 0.0–0.1)
Basophils Relative: 0 % (ref 0–1)
EOS ABS: 0.2 10*3/uL (ref 0.0–0.7)
Eosinophils Relative: 2 % (ref 0–5)
HEMATOCRIT: 20.8 % — AB (ref 39.0–52.0)
Hemoglobin: 7.4 g/dL — ABNORMAL LOW (ref 13.0–17.0)
LYMPHS ABS: 2.5 10*3/uL (ref 0.7–4.0)
Lymphocytes Relative: 27 % (ref 12–46)
MCH: 31.6 pg (ref 26.0–34.0)
MCHC: 35.6 g/dL (ref 30.0–36.0)
MCV: 88.9 fL (ref 78.0–100.0)
MONO ABS: 0.5 10*3/uL (ref 0.1–1.0)
Monocytes Relative: 5 % (ref 3–12)
NEUTROS ABS: 5.9 10*3/uL (ref 1.7–7.7)
Neutrophils Relative %: 66 % (ref 43–77)
Platelets: 351 10*3/uL (ref 150–400)
RBC: 2.34 MIL/uL — ABNORMAL LOW (ref 4.22–5.81)
RDW: 17.2 % — AB (ref 11.5–15.5)
WBC: 9.1 10*3/uL (ref 4.0–10.5)

## 2013-08-24 LAB — COMPREHENSIVE METABOLIC PANEL
ALT: 38 U/L (ref 0–53)
AST: 37 U/L (ref 0–37)
Albumin: 3.4 g/dL — ABNORMAL LOW (ref 3.5–5.2)
Alkaline Phosphatase: 80 U/L (ref 39–117)
BILIRUBIN TOTAL: 3.2 mg/dL — AB (ref 0.3–1.2)
BUN: 6 mg/dL (ref 6–23)
CO2: 30 mEq/L (ref 19–32)
Calcium: 8.7 mg/dL (ref 8.4–10.5)
Chloride: 99 mEq/L (ref 96–112)
Creatinine, Ser: 0.83 mg/dL (ref 0.50–1.35)
GFR calc non Af Amer: >90 mL/min (ref >90)
GLUCOSE: 93 mg/dL (ref 70–99)
Potassium: 4.2 mEq/L (ref 3.7–5.3)
Sodium: 138 mEq/L (ref 137–147)
TOTAL PROTEIN: 7 g/dL (ref 6.0–8.3)

## 2013-08-24 LAB — URINALYSIS, ROUTINE W REFLEX MICROSCOPIC
Bilirubin Urine: NEGATIVE
Glucose, UA: NEGATIVE mg/dL
Hgb urine dipstick: NEGATIVE
KETONES UR: NEGATIVE mg/dL
LEUKOCYTES UA: NEGATIVE
NITRITE: NEGATIVE
PH: 6 (ref 5.0–8.0)
PROTEIN: NEGATIVE mg/dL
Specific Gravity, Urine: 1.013 (ref 1.005–1.030)
Urobilinogen, UA: 1 mg/dL (ref 0.0–1.0)

## 2013-08-24 NOTE — Progress Notes (Signed)
[place] CELL SERVICE PROGRESS NOTE  [name] [id number] DOB: 1981/06/13 DOA: 08/21/2013 PCP: MATTHEWS,MICHELLE A., MD  Assessment/Plan: Active Problems:   Sickle cell anemia   Sickle cell anemia with crisis  1. Hb SS with crisis: Pt reports better controlled pain. Last IV push use at 6am today, pain rated as a 4/10. He thinks he may be able to go home but would like to see how his pain does to see if it's manageable at home. Around 4pm, pt was reluctant to go home as he was having more back pain. Leg pain has resolved. Will continue oral medications and dilaudid 1-2 mg IV q 2 hours as needed. Continue Toradol and IVF. 2. Leukocytosis: Resolved. Likely a reflection of crisis as patient has no sequelae of infection and is otherwise stable except for pain. 3. SCD: Continue Hydrea and Folic acid. 4. 4. Dark urine: Pt reports urine being darker than previously. He is likely less hydrated than usual and has high RBC turnover due to acute crisis. He is not sure if blood was seen. Will send urine for UA.  Code Status: Full Code Family Communication: N/A. Communicates over the phone Disposition Plan: Anticipate discharge in next 24 - 48 hours.  [name]  Pager [phone]. If 7PM-7AM, please contact night-coverage.  08/24/2013, 4:29 PM  LOS: 3 days   Brief narrative: Pt with Hb SS who is rarely hospitalized presented to [facility] on 1/7 secondary to pain. He was treated in the [facility] and continued to have significant pain in addition to emesis. He was transferred to the hospital for management of Acute Vaso-occlusive episode and hydration.   Consultants:  None  Procedures:  None  Antibiotics:  none  HPI/Subjective: Pt states that his pain is  a 4/10 in his mid to lower back. He has not had a BM today. He also thinks urine is darker than usual. He is not sure if blood was seen.   Objective: Filed Vitals:   08/23/13 1418 08/23/13 2130 08/24/13 0655 08/24/13 1443   BP: 110/64 130/65 103/52 120/57  Pulse: 74 80 80 80  Temp: 97.9 F (36.6 C) 98.1 F (36.7 C) 98.3 F (36.8 C) 97.9 F (36.6 C)  TempSrc: Oral Oral Oral Oral  Resp: 18 16 16 18   Height:      Weight:      SpO2: 97% 97% 95% 96%   Weight change:  No intake or output data in the 24 hours ending 08/24/13 1629  General: Alert, awake, oriented x3, in no acute distress.  HEENT: [place]/AT PEERL, EOMI, mild icterus. Neck: Trachea midline,  no masses, no thyromegal,y no JVD, no carotid bruit OROPHARYNX:  Moist, No exudate/ erythema/lesions.  Heart: Regular rate and rhythm, without murmurs, rubs, gallops, PMI non-displaced, no heaves or thrills on palpation.  Lungs: Clear to auscultation, no wheezing or rhonchi noted. No increased vocal fremitus resonant to percussion  Abdomen: Soft, nontender, nondistended, positive bowel sounds, no masses no hepatosplenomegaly noted.  Neuro: No focal neurological deficits noted cranial nerves II through XII grossly intact. Strength normal in bilateral upper and lower extremities. Musculoskeletal: No warm swelling or erythema around joints, no spinal tenderness noted. Psychiatric: Patient alert and oriented x3, good insight and cognition, good recent to remote recall.  -unchanged  Data Reviewed: Basic Metabolic Panel:  Recent Labs Lab 08/21/13 1237 08/22/13 0559 08/24/13 0519  NA 139 137 138  K 4.0 3.8 4.2  CL 102 100 99  CO2 27 28 30   GLUCOSE 90  118* 93  BUN 9 6 6   CREATININE 0.86 0.83 0.83  CALCIUM 9.6 8.9 8.7   Liver Function Tests:  Recent Labs Lab 08/21/13 1237 08/22/13 0559 08/24/13 0519  AST 26 27 37  ALT 13 11 38  ALKPHOS 73 50 80  BILITOT 2.9* 2.0* 3.2*  PROT 8.3 7.5 7.0  ALBUMIN 4.5 3.9 3.4*   CBC:  Recent Labs Lab 08/21/13 1237 08/22/13 0559 08/23/13 0534 08/24/13 0519  WBC 8.9 13.4* 8.7 9.1  NEUTROABS 5.8 9.5* 5.2 5.9  HGB 9.2* 8.3* 7.8* 7.4*  HCT 25.4* 22.8* 20.7* 20.8*  MCV 88.5 89.8 87.3 88.9  PLT 337 316 313  351    Scheduled Meds: . cholecalciferol  1,000 Units Oral Daily  . enoxaparin (LOVENOX) injection  40 mg Subcutaneous Q24H  . folic acid  1 mg Oral Daily  . hydroxyurea  1,000 mg Oral BID  . ketorolac  30 mg Intravenous Q6H  . oxyCODONE-acetaminophen  1 tablet Oral Q4H   And  . oxyCODONE  5 mg Oral Q4H  . polyethylene glycol  17 g Oral Daily  . senna-docusate  1 tablet Oral BID   Continuous Infusions:    Total time 30 minutes.  [name] Shan, MD 08/24/13

## 2013-08-25 LAB — CBC WITH DIFFERENTIAL/PLATELET
BASOS PCT: 0 % (ref 0–1)
Basophils Absolute: 0 10*3/uL (ref 0.0–0.1)
EOS PCT: 3 % (ref 0–5)
Eosinophils Absolute: 0.3 10*3/uL (ref 0.0–0.7)
HCT: 19.3 % — ABNORMAL LOW (ref 39.0–52.0)
HEMOGLOBIN: 7 g/dL — AB (ref 13.0–17.0)
LYMPHS ABS: 1.9 10*3/uL (ref 0.7–4.0)
Lymphocytes Relative: 21 % (ref 12–46)
MCH: 32.1 pg (ref 26.0–34.0)
MCHC: 36.3 g/dL — ABNORMAL HIGH (ref 30.0–36.0)
MCV: 88.5 fL (ref 78.0–100.0)
MONO ABS: 0.5 10*3/uL (ref 0.1–1.0)
Monocytes Relative: 5 % (ref 3–12)
Neutro Abs: 6.4 10*3/uL (ref 1.7–7.7)
Neutrophils Relative %: 71 % (ref 43–77)
Platelets: 415 10*3/uL — ABNORMAL HIGH (ref 150–400)
RBC: 2.18 MIL/uL — AB (ref 4.22–5.81)
RDW: 17.7 % — ABNORMAL HIGH (ref 11.5–15.5)
WBC: 9.1 10*3/uL (ref 4.0–10.5)

## 2013-08-25 LAB — COMPREHENSIVE METABOLIC PANEL
ALBUMIN: 3.3 g/dL — AB (ref 3.5–5.2)
ALT: 50 U/L (ref 0–53)
AST: 38 U/L — AB (ref 0–37)
Alkaline Phosphatase: 90 U/L (ref 39–117)
BUN: 6 mg/dL (ref 6–23)
CALCIUM: 8.7 mg/dL (ref 8.4–10.5)
CO2: 29 mEq/L (ref 19–32)
Chloride: 101 mEq/L (ref 96–112)
Creatinine, Ser: 0.88 mg/dL (ref 0.50–1.35)
GFR calc Af Amer: >90 mL/min (ref >90)
GFR calc non Af Amer: >90 mL/min (ref >90)
Glucose, Bld: 95 mg/dL (ref 70–99)
Potassium: 4.4 mEq/L (ref 3.7–5.3)
Sodium: 138 mEq/L (ref 137–147)
TOTAL PROTEIN: 7.1 g/dL (ref 6.0–8.3)
Total Bilirubin: 2.2 mg/dL — ABNORMAL HIGH (ref 0.3–1.2)

## 2013-08-25 NOTE — Discharge Summary (Signed)
Sickle-Cell Discharge Summary  [name] ZOX:[id number] DOB: June 19, 1981 DOA: 08/21/2013  PCP: MATTHEWS,MICHELLE A., MD  Admit date: 08/21/2013 Discharge date: 08/25/2013  Discharge Diagnoses:  Active Problems:   Sickle cell anemia   Sickle cell anemia with crisis   Discharge Condition: improved/discharge to home  Disposition:  Follow-up Information   Follow up with MATTHEWS,MICHELLE A., MD.   Specialty:  Internal Medicine   Contact information:   [place] [address] [phone]       Diet:general  Wt Readings from Last 3 Encounters:  08/21/13 139 lb 6.4 oz (63.231 kg)  05/21/13 149 lb (67.586 kg)  02/18/13 145 lb (65.772 kg)    History of present illness:  A 33 yo man with known hx of Sickle cell disease (Hgb-SS) presented with Sickle cell painful crisis. Has pain in his back and limbs at 7/10. No fever, no chills, no NVD. Has not been in hospital for a while and is rarely hospitalized. Was initially seen in the day hospital and transferred because he continued to have significant pain in addition to emesis. He was transferred to the hospital for management of Acute Vaso-occlusive episode and hydration.    Hospital Course:  Hb SS with crisis: He was started on a Dilaudid PCA, Toradol, and IV fluids on the floor. He was later transitioned to PO. Pain on admission was a 7/10 and was a 3/10 prior to discharge. Emesis was managed with Zofran.  Leukocytosis: Initially elevated but thought to be due to acute crisis and less likely an infectious process. CBCs were monitored daily. His leukocytosis resolved.   SCD: We continued his Hydrea and Folic acid while he was inpatient.  Dark urine: Pt reported dark urine on the night of 1/10. It was likely due to being less hydrated than usual and having a high RBC turnover. UA was sent looking for blood and hgb was negative. Advised pt to have annual STI screening by PCP if he is sexually active.   Discharge  Exam: Filed Vitals:   08/25/13 0645  BP: 125/61  Pulse: 80  Temp: 98.4 F (36.9 C)  Resp: 16   Filed Vitals:   08/24/13 0655 08/24/13 1443 08/24/13 2043 08/25/13 0645  BP: 103/52 120/57 109/62 125/61  Pulse: 80 80 78 80  Temp: 98.3 F (36.8 C) 97.9 F (36.6 C) 98.8 F (37.1 C) 98.4 F (36.9 C)  TempSrc: Oral Oral Oral Oral  Resp: 16 18 16 16   Height:      Weight:      SpO2: 95% 96% 99% 95%   General: Alert, awake, oriented x3, in no acute distress.  HEENT: [place]/AT PEERL, EOMI, mild icterus.  Neck: Trachea midline, no masses, no thyromegal,y no JVD, no carotid bruit  OROPHARYNX: Moist, No exudate/ erythema/lesions.  Heart: Regular rate and rhythm, without murmurs, rubs, gallops, PMI non-displaced, no heaves or thrills on palpation.  Lungs: Clear to auscultation, no wheezing or rhonchi noted.   Abdomen: Soft, nontender, nondistended, positive bowel sounds, no masses no hepatosplenomegaly noted.  Neuro: No focal neurological deficits noted cranial nerves II through XII grossly intact. Strength normal in bilateral upper and lower extremities.  Musculoskeletal: No warm swelling or erythema around joints, no spinal tenderness noted.    Discharge Instructions   Future Appointments Provider Department Dept Phone   08/26/2013 3:00 PM Leana Gamer, MD [place] [phone]       Medication List  cholecalciferol 1000 UNITS tablet  Commonly known as:  VITAMIN D  Take 1,000 Units by mouth daily.     folic acid 678 MCG tablet  Commonly known as:  FOLVITE  Take 400 mcg by mouth daily.     hydroxyurea 500 MG capsule  Commonly known as:  HYDREA  Take 2 capsules (1,000 mg total) by mouth 2 (two) times daily. May take with food to minimize GI side effects.     oxyCODONE-acetaminophen 10-325 MG per tablet  Commonly known as:  PERCOCET  Take 1 tablet by mouth every 6 (six) hours as needed for pain.         The results of significant  diagnostics from this hospitalization (including imaging, microbiology, ancillary and laboratory) are listed below for reference.    Significant Diagnostic Studies: No results found.  Microbiology: No results found for this or any previous visit (from the past 240 hour(s)).   Labs: Basic Metabolic Panel:  Recent Labs Lab 08/21/13 1237 08/22/13 0559 08/24/13 0519 08/25/13 0544  NA 139 137 138 138  K 4.0 3.8 4.2 4.4  CL 102 100 99 101  CO2 27 28 30 29   GLUCOSE 90 118* 93 95  BUN 9 6 6 6   CREATININE 0.86 0.83 0.83 0.88  CALCIUM 9.6 8.9 8.7 8.7   Liver Function Tests:  Recent Labs Lab 08/21/13 1237 08/22/13 0559 08/24/13 0519 08/25/13 0544  AST 26 27 37 38*  ALT 13 11 38 50  ALKPHOS 73 50 80 90  BILITOT 2.9* 2.0* 3.2* 2.2*  PROT 8.3 7.5 7.0 7.1  ALBUMIN 4.5 3.9 3.4* 3.3*   CBC:  Recent Labs Lab 08/21/13 1237 08/22/13 0559 08/23/13 0534 08/24/13 0519 08/25/13 0544  WBC 8.9 13.4* 8.7 9.1 9.1  NEUTROABS 5.8 9.5* 5.2 5.9 6.4  HGB 9.2* 8.3* 7.8* 7.4* 7.0*  HCT 25.4* 22.8* 20.7* 20.8* 19.3*  MCV 88.5 89.8 87.3 88.9 88.5  PLT 337 316 313 351 415*   Time coordinating discharge: 40 minutes  Signed:  [name] Sickle Cell Service 08/25/2013, 1:28 PM

## 2013-08-25 NOTE — Discharge Instructions (Signed)
Sickle Cell Anemia, Adult °Sickle cell anemia is a condition in which red blood cells have an abnormal "sickle" shape. This abnormal shape shortens the cells' life span, which results in a lower than normal concentration of red blood cells in the blood. The sickle shape also causes the cells to clump together and block free blood flow through the blood vessels. As a result, the tissues and organs of the body do not receive enough oxygen. Sickle cell anemia causes organ damage and pain and increases the risk of infection. °CAUSES  °Sickle cell anemia is a genetic disorder. Those who receive two copies of the gene have the condition, and those who receive one copy have the trait. °RISK FACTORS °The sickle cell gene is most common in people whose families originated in [place]. Other areas of the globe where sickle cell trait occurs include the Mediterranean, [place] and [place], the [place], and the [place].  °SIGNS AND SYMPTOMS °· Pain, especially in the extremities, back, chest, or abdomen (common). The pain may start suddenly or may develop following an illness, especially if there is dehydration. Pain can also occur due to overexertion or exposure to extreme temperature changes. °· Frequent severe bacterial infections, especially certain types of pneumonia and meningitis. °· Pain and swelling in the hands and feet. °· Decreased activity.   °· Loss of appetite.   °· Change in behavior. °· Headaches. °· Seizures. °· Shortness of breath or difficulty breathing. °· Vision changes. °· Skin ulcers. °Those with the trait may not have symptoms or they may have mild symptoms.  °DIAGNOSIS  °Sickle cell anemia is diagnosed with blood tests that demonstrate the genetic trait. It is often diagnosed during the newborn period, due to mandatory testing nationwide. A variety of blood tests, X-rays, CT scans, MRI scans, ultrasounds, and lung function tests may also be done to monitor the condition. °TREATMENT  °Sickle  cell anemia may be treated with: °· Medicines. You may be given pain medicines, antibiotic medicines (to treat and prevent infections) or medicines to increase the production of certain types of hemoglobin. °· Fluids. °· Oxygen. °· Blood transfusions. °HOME CARE INSTRUCTIONS  °· Drink enough fluid to keep your urine clear or pale yellow. Increase your fluid intake in hot weather and during exercise. °· Do not smoke. Smoking lowers oxygen levels in the blood.   °· Only take over-the-counter or prescription medicines for pain, fever, or discomfort as directed by your health care provider. °· Take antibiotics as directed by your health care provider. Make sure you finish them it even if you start to feel better.   °· Take supplements as directed by your health care provider.   °· Consider wearing a medical alert bracelet. This tells anyone caring for you in an emergency of your condition.   °· When traveling, keep your medical information, health care provider's names, and the medicines you take with you at all times.   °· If you develop a fever, do not take medicines to reduce the fever right away. This could cover up a problem that is developing. Notify your health care provider. °· Keep all follow-up appointments with your health care provider. Sickle cell anemia requires regular medical care. °SEEK MEDICAL CARE IF: ° You have a fever. °SEEK IMMEDIATE MEDICAL CARE IF:  °· You feel dizzy or faint.   °· You have new abdominal pain, especially on the left side near the stomach area.   °· You develop a persistent, often uncomfortable and painful penile erection (priapism). If this is not treated immediately it   will lead to impotence.   °· You have numbness your arms or legs or you have a hard time moving them.   °· You have a hard time with speech.   °· You have a fever or persistent symptoms for more than 2 3 days.   °· You have a fever and your symptoms suddenly get worse.   °· You have signs or symptoms of infection.  These include:   °· Chills.   °· Abnormal tiredness (lethargy).   °· Irritability.   °· Poor eating.   °· Vomiting.   °· You develop pain that is not helped with medicine.   °· You develop shortness of breath. °· You have pain in your chest.   °· You are coughing up pus-like or bloody sputum.   °· You develop a stiff neck. °· Your feet or hands swell or have pain. °· Your abdomen appears bloated. °· You develop joint pain. °MAKE SURE YOU: °· Understand these instructions. °· Will watch your child's condition. °· Will get help right away if your child is not doing well or gets worse. °Document Released: 11/09/2005 Document Revised: 05/22/2013 Document Reviewed: 03/13/2013 °[employer]® Patient Information ©2014 [employer]. ° °

## 2013-08-26 ENCOUNTER — Ambulatory Visit (INDEPENDENT_AMBULATORY_CARE_PROVIDER_SITE_OTHER): Payer: Medicare Other | Admitting: Internal Medicine

## 2013-08-26 ENCOUNTER — Encounter: Payer: Self-pay | Admitting: Internal Medicine

## 2013-08-26 VITALS — BP 120/64 | HR 70 | Temp 97.8°F | Resp 16 | Ht 73.0 in | Wt 142.0 lb

## 2013-08-26 DIAGNOSIS — D571 Sickle-cell disease without crisis: Secondary | ICD-10-CM | POA: Diagnosis not present

## 2013-08-26 DIAGNOSIS — Z23 Encounter for immunization: Secondary | ICD-10-CM

## 2013-08-26 DIAGNOSIS — R636 Underweight: Secondary | ICD-10-CM | POA: Diagnosis not present

## 2013-08-26 MED ORDER — HYDROXYUREA 500 MG PO CAPS: 1000.0000 mg | ORAL_CAPSULE | Freq: Two times a day (BID) | ORAL | Status: DC

## 2013-08-26 NOTE — Progress Notes (Signed)
   Subjective:    Patient ID: [name], male    DOB: 1980-09-03, 33 y.o.   MRN: [id number]  HPI: Pt here for post-hospital visit. States that he feels "pretty good" today. Has used only 0.5 of his Percocet tab today. Pt states that he has a profound fear of addiction as his mother was addicted to substances prior to his birth. Reviewed labs with patient and indices stable for continued Hydrea. Pt has no SOB, no Jaundice, fatigue, chest pain or lightheadedness.    Review of Systems  Constitutional: Negative.   HENT: Negative.   Eyes: Negative.   Respiratory: Negative.   Cardiovascular: Negative.   Gastrointestinal: Negative.   Endocrine: Negative.   Genitourinary: Negative.   Musculoskeletal: Positive for arthralgias and myalgias.  Skin: Negative.   Allergic/Immunologic: Negative.   Neurological: Negative.   Hematological: Negative.   Psychiatric/Behavioral: Negative.        Objective:   Physical Exam  Constitutional: He is oriented to person, place, and time. He appears well-developed and well-nourished. He appears distressed.  HENT:  Head: Atraumatic.  Eyes: Conjunctivae and EOM are normal. Pupils are equal, round, and reactive to light. No scleral icterus.  Neck: Normal range of motion. Neck supple.  Cardiovascular: Normal rate and regular rhythm.  Exam reveals no gallop and no friction rub.   No murmur heard. Pulmonary/Chest: Effort normal and breath sounds normal. He has no wheezes. He has no rales. He exhibits no tenderness.  Abdominal: Soft. Bowel sounds are normal. He exhibits no mass.  Musculoskeletal: Normal range of motion.  Neurological: He is alert and oriented to person, place, and time.  Skin: Skin is warm and dry.  Psychiatric: He has a normal mood and affect. His behavior is normal. Judgment and thought content normal.          Assessment & Plan:  1. Hb SS: Pt discharged from hospital yesterday after being admitted for a crisis.He states that his  pain today is manageable to allow a function.  Continue current analgesic medications.   Last vision examination 04/2013.   2. Immunizations: TDAP today.   3. Underweight: reinforced recommendations for carnation Instant Breakfast.  4. Health Maintenance: Pt due for annual Physical Examination. Testicular examination during annual examination.    Labs: TSH 1 week before annual examination

## 2013-09-16 ENCOUNTER — Telehealth: Payer: Self-pay | Admitting: Internal Medicine

## 2013-09-16 DIAGNOSIS — G894 Chronic pain syndrome: Secondary | ICD-10-CM

## 2013-09-16 MED ORDER — OXYCODONE-ACETAMINOPHEN 10-325 MG PO TABS: 1.0000 | ORAL_TABLET | Freq: Four times a day (QID) | ORAL | Status: DC | PRN

## 2013-09-16 NOTE — Telephone Encounter (Signed)
Prescription issued for Percocet 10/325 mg #90. 

## 2013-09-16 NOTE — Telephone Encounter (Signed)
Refill request for oxyCODONE-acetaminophen (PERCOCET) 10-325 MG per tablet  

## 2013-09-18 ENCOUNTER — Telehealth: Payer: Self-pay | Admitting: Hematology

## 2013-09-18 NOTE — Telephone Encounter (Signed)
Patient calling in to see if prescription is ready.  I explained prescription for percocet is ready, however prescription pick-ups are on Tuesday/Friday from 9-4:30.  Patient states he is completely out of medication and was not able to come in on yesterday to get prescription.  I advised that since he was completely out and this is a pain medication, he could come today.  I explained the prescription process again to the patient and he verbalized understanding of this pick up times for future medications.

## 2013-10-14 ENCOUNTER — Telehealth: Payer: Self-pay | Admitting: Internal Medicine

## 2013-10-14 DIAGNOSIS — G894 Chronic pain syndrome: Secondary | ICD-10-CM

## 2013-10-16 MED ORDER — OXYCODONE-ACETAMINOPHEN 10-325 MG PO TABS: 1.0000 | ORAL_TABLET | Freq: Four times a day (QID) | ORAL | Status: DC | PRN

## 2013-10-16 NOTE — Telephone Encounter (Signed)
Prescription written for Percocet 10-325 mg #90 tabs to be used q 4 hours PRN pain.

## 2013-11-11 ENCOUNTER — Telehealth (HOSPITAL_COMMUNITY): Payer: Self-pay | Admitting: Hematology

## 2013-11-11 DIAGNOSIS — G894 Chronic pain syndrome: Secondary | ICD-10-CM

## 2013-11-11 MED ORDER — OXYCODONE-ACETAMINOPHEN 10-325 MG PO TABS: 1.0000 | ORAL_TABLET | Freq: Four times a day (QID) | ORAL | Status: DC | PRN

## 2013-11-11 NOTE — Telephone Encounter (Signed)
Prescribed Percocet 10-325 mg every 6 hours as needed for pain #90 tablets

## 2013-11-11 NOTE — Telephone Encounter (Signed)
Caller calling back in regards to previous phone call into the [facility].  Explained that Per [place], NP, he can pick his prescription up tomorrow.  If he has chest pain, then he needs to go to the ED.  Caller verbalizes understanding.

## 2013-11-11 NOTE — Telephone Encounter (Signed)
Refill request for Percocet 10-325

## 2013-11-14 ENCOUNTER — Ambulatory Visit (INDEPENDENT_AMBULATORY_CARE_PROVIDER_SITE_OTHER): Payer: Medicare Other | Admitting: Internal Medicine

## 2013-11-14 ENCOUNTER — Non-Acute Institutional Stay (HOSPITAL_COMMUNITY)
Admission: AD | Admit: 2013-11-14 | Discharge: 2013-11-14 | Disposition: A | Discharge status: Home / Self Care | Payer: Medicare Other | Source: Ambulatory Visit | Attending: Internal Medicine | Admitting: Internal Medicine

## 2013-11-14 ENCOUNTER — Encounter: Payer: Self-pay | Admitting: Internal Medicine

## 2013-11-14 VITALS — BP 131/83 | HR 85 | Temp 98.1°F | Resp 20 | Ht 73.0 in | Wt 138.0 lb

## 2013-11-14 DIAGNOSIS — D5700 Hb-SS disease with crisis, unspecified: Secondary | ICD-10-CM

## 2013-11-14 DIAGNOSIS — M25519 Pain in unspecified shoulder: Secondary | ICD-10-CM | POA: Diagnosis not present

## 2013-11-14 DIAGNOSIS — Z79899 Other long term (current) drug therapy: Secondary | ICD-10-CM | POA: Diagnosis not present

## 2013-11-14 DIAGNOSIS — E860 Dehydration: Secondary | ICD-10-CM | POA: Insufficient documentation

## 2013-11-14 DIAGNOSIS — M898X1 Other specified disorders of bone, shoulder: Secondary | ICD-10-CM | POA: Diagnosis present

## 2013-11-14 DIAGNOSIS — D571 Sickle-cell disease without crisis: Secondary | ICD-10-CM | POA: Diagnosis not present

## 2013-11-14 DIAGNOSIS — Z9089 Acquired absence of other organs: Secondary | ICD-10-CM | POA: Insufficient documentation

## 2013-11-14 MED ORDER — HYDROMORPHONE HCL PF 2 MG/ML IJ SOLN
INTRAMUSCULAR | Status: AC
  Filled 2013-11-14: qty 1

## 2013-11-14 MED ORDER — DEXTROSE-NACL 5-0.45 % IV SOLN: INTRAVENOUS | Status: DC

## 2013-11-14 MED ORDER — HYDROMORPHONE HCL PF 2 MG/ML IJ SOLN
1.5000 mg | Freq: Once | INTRAMUSCULAR | Status: AC
  Administered 2013-11-14: 1.5 mg via SUBCUTANEOUS
  Filled 2013-11-14: qty 1

## 2013-11-14 MED ORDER — HYDROMORPHONE HCL PF 2 MG/ML IJ SOLN
1.8000 mg | Freq: Once | INTRAMUSCULAR | Status: AC
  Administered 2013-11-14: 1.8 mg via SUBCUTANEOUS
  Filled 2013-11-14: qty 1

## 2013-11-14 MED ORDER — IBUPROFEN 600 MG PO TABS: 600.0000 mg | ORAL_TABLET | Freq: Four times a day (QID) | ORAL | Status: DC | PRN

## 2013-11-14 MED ORDER — HYDROMORPHONE HCL PF 2 MG/ML IJ SOLN: 1.8000 mg | Freq: Once | INTRAMUSCULAR | Status: DC

## 2013-11-14 MED ORDER — KETOROLAC TROMETHAMINE 30 MG/ML IJ SOLN
30.0000 mg | Freq: Once | INTRAMUSCULAR | Status: AC
  Administered 2013-11-14: 30 mg via INTRAMUSCULAR
  Filled 2013-11-14: qty 1

## 2013-11-14 MED ORDER — KETOROLAC TROMETHAMINE 30 MG/ML IJ SOLN: 30.0000 mg | Freq: Once | INTRAMUSCULAR | Status: DC

## 2013-11-14 NOTE — Discharge Instructions (Signed)
Sickle Cell Anemia, Adult °Sickle cell anemia is a condition in which red blood cells have an abnormal "sickle" shape. This abnormal shape shortens the cells' life span, which results in a lower than normal concentration of red blood cells in the blood. The sickle shape also causes the cells to clump together and block free blood flow through the blood vessels. As a result, the tissues and organs of the body do not receive enough oxygen. Sickle cell anemia causes organ damage and pain and increases the risk of infection. °CAUSES  °Sickle cell anemia is a genetic disorder. Those who receive two copies of the gene have the condition, and those who receive one copy have the trait. °RISK FACTORS °The sickle cell gene is most common in people whose families originated in [place]. Other areas of the globe where sickle cell trait occurs include the Mediterranean, [place] and [place], the [place], and the [place].  °SIGNS AND SYMPTOMS °· Pain, especially in the extremities, back, chest, or abdomen (common). The pain may start suddenly or may develop following an illness, especially if there is dehydration. Pain can also occur due to overexertion or exposure to extreme temperature changes. °· Frequent severe bacterial infections, especially certain types of pneumonia and meningitis. °· Pain and swelling in the hands and feet. °· Decreased activity.   °· Loss of appetite.   °· Change in behavior. °· Headaches. °· Seizures. °· Shortness of breath or difficulty breathing. °· Vision changes. °· Skin ulcers. °Those with the trait may not have symptoms or they may have mild symptoms.  °DIAGNOSIS  °Sickle cell anemia is diagnosed with blood tests that demonstrate the genetic trait. It is often diagnosed during the newborn period, due to mandatory testing nationwide. A variety of blood tests, X-rays, CT scans, MRI scans, ultrasounds, and lung function tests may also be done to monitor the condition. °TREATMENT  °Sickle  cell anemia may be treated with: °· Medicines. You may be given pain medicines, antibiotic medicines (to treat and prevent infections) or medicines to increase the production of certain types of hemoglobin. °· Fluids. °· Oxygen. °· Blood transfusions. °HOME CARE INSTRUCTIONS  °· Drink enough fluid to keep your urine clear or pale yellow. Increase your fluid intake in hot weather and during exercise. °· Do not smoke. Smoking lowers oxygen levels in the blood.   °· Only take over-the-counter or prescription medicines for pain, fever, or discomfort as directed by your health care provider. °· Take antibiotics as directed by your health care provider. Make sure you finish them it even if you start to feel better.   °· Take supplements as directed by your health care provider.   °· Consider wearing a medical alert bracelet. This tells anyone caring for you in an emergency of your condition.   °· When traveling, keep your medical information, health care provider's names, and the medicines you take with you at all times.   °· If you develop a fever, do not take medicines to reduce the fever right away. This could cover up a problem that is developing. Notify your health care provider. °· Keep all follow-up appointments with your health care provider. Sickle cell anemia requires regular medical care. °SEEK MEDICAL CARE IF: ° You have a fever. °SEEK IMMEDIATE MEDICAL CARE IF:  °· You feel dizzy or faint.   °· You have new abdominal pain, especially on the left side near the stomach area.   °· You develop a persistent, often uncomfortable and painful penile erection (priapism). If this is not treated immediately it   will lead to impotence.   °· You have numbness your arms or legs or you have a hard time moving them.   °· You have a hard time with speech.   °· You have a fever or persistent symptoms for more than 2 3 days.   °· You have a fever and your symptoms suddenly get worse.   °· You have signs or symptoms of infection.  These include:   °· Chills.   °· Abnormal tiredness (lethargy).   °· Irritability.   °· Poor eating.   °· Vomiting.   °· You develop pain that is not helped with medicine.   °· You develop shortness of breath. °· You have pain in your chest.   °· You are coughing up pus-like or bloody sputum.   °· You develop a stiff neck. °· Your feet or hands swell or have pain. °· Your abdomen appears bloated. °· You develop joint pain. °MAKE SURE YOU: °· Understand these instructions. °· Will watch your child's condition. °· Will get help right away if your child is not doing well or gets worse. °Document Released: 11/09/2005 Document Revised: 05/22/2013 Document Reviewed: 03/13/2013 °[employer]® Patient Information ©2014 [employer]. ° °

## 2013-11-14 NOTE — H&P (Signed)
[facility] History and Physical   Date: 11/14/2013  Patient name: [name] Medical record number: [id number] Date of birth: May 25, 1981 Age: 33 y.o. Gender: male PCP: MATTHEWS,MICHELLE A., MD  Attending physician: Leana Gamer, MD  Chief Complaint: Right clavicle pain  History of Present Illness: Patient complaining of pain to right collar bone. Patient was in office primary care 3 month follow up complaining of right collarbone pain with 8/10 intensity described as throbbing, localized, non radiating and tender to touch  for  4 days. Reports that pain starting in his chest on 11/10/2013, but has localized to right collar bone.  He maintains that pain is worsened by daily activities. Patient reports that pain intensity is unrelieved by current pain regimen, last taken around 1 pm. Patient denies nausea, vomiting, diarrhea, headache, dizziness, or chest pains.   Admit to [facility] cell day hospital for extended observation.   Meds: Prescriptions prior to admission  Medication Sig Dispense Refill  . cholecalciferol (VITAMIN D) 1000 UNITS tablet Take 1,000 Units by mouth daily.      . folic acid (FOLVITE) 948 MCG tablet Take 400 mcg by mouth daily.      . hydroxyurea (HYDREA) 500 MG capsule Take 2 capsules (1,000 mg total) by mouth 2 (two) times daily. May take with food to minimize GI side effects.  120 capsule  3  . oxyCODONE-acetaminophen (PERCOCET) 10-325 MG per tablet Take 1 tablet by mouth every 6 (six) hours as needed for pain.  90 tablet  0    Allergies: Review of patient's allergies indicates no known allergies. Past Medical History  Diagnosis Date  . Sickle cell anemia    Past Surgical History  Procedure Laterality Date  . Cholecystectomy     Family History  Problem Relation Age of Onset  . Sickle cell anemia Mother   . Sickle cell trait Father   . Sickle cell trait Sister   . Sickle cell trait Brother   . Cancer Maternal Grandmother    History    Social History  . Marital Status: Single    Spouse Name: N/A    Number of Children: N/A  . Years of Education: N/A   Occupational History  . Not on file.   Social History Main Topics  . Smoking status: Never Smoker   . Smokeless tobacco: Not on file  . Alcohol Use: No  . Drug Use: No  . Sexual Activity: Yes   Other Topics Concern  . Not on file   Social History Narrative  . No narrative on file    Review of Systems: Constitutional: negative Eyes: negative Ears, nose, mouth, throat, and face: negative Respiratory: negative Cardiovascular: negative Gastrointestinal: negative Genitourinary:negative Integument/breast: negative Hematologic/lymphatic: negative Musculoskeletal:positive for bone pain and myalgias Neurological: negative Behavioral/Psych: negative Endocrine: negative  Physical Exam: There were no vitals taken for this visit. There were no vitals taken for this visit. General appearance: alert, cooperative and mild distress Head: Normocephalic, without obvious abnormality, atraumatic Eyes: conjunctivae/corneas clear. PERRL, EOM's intact. Fundi benign. Ears: normal TM's and external ear canals both ears Nose: Nares normal. Septum midline. Mucosa normal. No drainage or sinus tenderness. Throat: lips, mucosa, and tongue normal; teeth and gums normal Neck: no adenopathy, no carotid bruit, no JVD, supple, symmetrical, trachea midline and thyroid not enlarged, symmetric, no tenderness/mass/nodules Back: symmetric, no curvature. ROM normal. No CVA tenderness. Lungs: clear to auscultation bilaterally Chest wall: Right clavicle tender to palpation. No edema present Heart: regular rate and rhythm,  S1, S2 normal, no murmur, click, rub or gallop Abdomen: soft, non-tender; bowel sounds normal; no masses,  no organomegaly Extremities: extremities normal, atraumatic, no cyanosis or edema Pulses: 2+ and symmetric Skin: Skin color, texture, turgor normal. No rashes or  lesions  Lab results: No results found for this or any previous visit (from the past 24 hour(s)).  Imaging results:  No results found.   Assessment & Plan: 1. Sickle cell pain:  Admit to sickle cell day hospital for extended observation.  Start IVFs patient appears mildly dehydrated and admits that he has not been drinking fluids. Will initiate IV Toradol 30 mg X1 and Dilaudid IV per weight based rapid re-dosing. Will monitor patient closely and re-assess pain. Obtain CBC with differential and a complete metabolic panel.   Elaysha Bevard M 11/14/2013, 6:02 PM

## 2013-11-14 NOTE — Progress Notes (Signed)
Patient ID: [name], male   DOB: [birth year], 33 y.o.   MRN: [id number] Discharge instructions given to patient.  Explained prescription is at the pharmacy for pickup.  Significant other at bedside.  Questions answered.

## 2013-11-14 NOTE — Discharge Summary (Signed)
[name] Discharge Summary   Patient ID: [name] MRN: [id number] DOB/AGE: April 27, 1981 33 y.o.  Admit date: 11/14/2013 Discharge date: 11/14/2013  Primary Care Physician:  MATTHEWS,MICHELLE A., MD  Admission Diagnoses:  Active Problems:   [name] cell anemia   Pain of right clavicle   Discharge Diagnoses:   [name] cell anemia with pain  Discharge Medications:    Medication List    ASK your doctor about these medications       cholecalciferol 1000 UNITS tablet  Commonly known as:  VITAMIN D  Take 1,000 Units by mouth daily.     folic acid 086 MCG tablet  Commonly known as:  FOLVITE  Take 400 mcg by mouth daily.     hydroxyurea 500 MG capsule  Commonly known as:  HYDREA  Take 2 capsules (1,000 mg total) by mouth 2 (two) times daily. May take with food to minimize GI side effects.     oxyCODONE-acetaminophen 10-325 MG per tablet  Commonly known as:  PERCOCET  Take 1 tablet by mouth every 6 (six) hours as needed for pain.         Consults:  None  Significant Diagnostic Studies:  No results found.   [name] Course: Patient was transferred to day hospital from primary appointment for extended observation. Started Dilaudid subcutaneous per weight based dosing times 2 doses. Started Toradol IM.  Patient maintains that he currently does not have pain which is decreased from 7-8/10 in intensity. Staff was unable to gain IV access, so patient was encouraged to maintain hydration with 64 ounces of free water. Patient is functional and reports that he can manage pain at home on current regimen. Patient will return to clinic on 11/18/2012 for labs as staff was unable to obtain labs due to poor venous access.   Physical Exam at Discharge:  There were no vitals taken for this visit.  General appearance: alert, cooperative and mild distress  Back: symmetric, no curvature. ROM normal. No CVA tenderness.  Lungs: clear to auscultation  bilaterally  Chest wall: Right clavicle  Mildly tender to palpation. No edema present  Heart: regular rate and rhythm, S1, S2 normal, no murmur, click, rub or gallop  Abdomen: soft, non-tender; bowel sounds normal; no masses, no organomegaly  Extremities: extremities normal, atraumatic, no cyanosis or edema  Pulses: 2+ and symmetric  Skin: Skin color, texture, turgor normal. No rashes or lesions      Disposition at Discharge: 01-Home or Self Care  Discharge Orders:   Condition at Discharge:   Stable  Time spent on Discharge:  Greater than 30 minutes.  Signed: Milton Sagona M 11/14/2013, 6:11 PM

## 2013-11-15 NOTE — Progress Notes (Signed)
   Subjective:    Patient ID: [name], male    DOB: [birth year], 33 y.o.   MRN: [id number]  HPI: [name] is here today for his 3 month visit. However he is having significant pain if sickle cell disease. He reports pain in the chest area and neck starting on Sunday (4 days ago). HE states that he increased his fluid intake and treated his pain with oral analgesics as directed. He did not call as he does not want IV analgesics and has a great fear of dependence. However at presentation today, he rates his pain at 8/10 localized to clavicles and sternum. He denies difficulty in breathing and any other associated symptoms. He denies any recent excessive icterus. This is corroborated by his girlfriend who is present.    Review of Systems  Constitutional: Negative.   HENT: Negative.   Eyes: Negative.   Respiratory: Negative.   Cardiovascular: Negative.   Gastrointestinal: Negative.   Endocrine: Negative.   Genitourinary: Negative.   Musculoskeletal: Positive for arthralgias and myalgias.  Skin: Negative.   Allergic/Immunologic: Negative.   Neurological: Negative.   Hematological: Negative.   Psychiatric/Behavioral: Negative.        Objective:   Physical Exam  Constitutional: He is oriented to person, place, and time. He appears well-developed and well-nourished. He appears distressed.  HENT:  Head: Atraumatic.  Eyes: Conjunctivae and EOM are normal. Pupils are equal, round, and reactive to light. No scleral icterus.  Neck: Normal range of motion. Neck supple.  Cardiovascular: Normal rate and regular rhythm.  Exam reveals no gallop and no friction rub.   No murmur heard. Pulmonary/Chest: Effort normal and breath sounds normal. He has no wheezes. He has no rales. He exhibits no tenderness.  Abdominal: Soft. Bowel sounds are normal. He exhibits no distension and no mass. There is no tenderness.  Musculoskeletal: Normal range of motion. He exhibits tenderness (Bilateral Clavicles  asnd sternum).  Neurological: He is alert and oriented to person, place, and time.  Skin: Skin is warm and dry.  Psychiatric: He has a normal mood and affect. His behavior is normal. Judgment and thought content normal.          Assessment & Plan:  1. Hb SS with early acute vaso-occlusive crisis: Will transfer to Day hospital for acute treatment.

## 2013-11-19 NOTE — Discharge Summary (Signed)
Pt seen and examined and discussed with NP Lachina Hollis. Agree with discharge home.  

## 2013-11-19 NOTE — H&P (Signed)
Pt seen and examined and discussed with NP Cammie Sickle. Agree with initial plan. However if IV access cannot be obtained will treat with [place] dilaudid and IM Toradol as IV access has been difficult in this patient in the past .

## 2013-11-25 ENCOUNTER — Ambulatory Visit: Payer: Medicare Other | Admitting: Internal Medicine

## 2013-12-02 ENCOUNTER — Other Ambulatory Visit: Payer: Self-pay

## 2013-12-02 DIAGNOSIS — G894 Chronic pain syndrome: Secondary | ICD-10-CM

## 2013-12-02 MED ORDER — OXYCODONE-ACETAMINOPHEN 10-325 MG PO TABS: 1.0000 | ORAL_TABLET | Freq: Four times a day (QID) | ORAL | Status: DC | PRN

## 2013-12-02 NOTE — Telephone Encounter (Signed)
Medication refill request for oxyCODONE-acetaminophen (PERCOCET) 10-325 MG per tablet  Last office visit 11/14/2013

## 2013-12-02 NOTE — Telephone Encounter (Signed)
Prescription refilled for Percocet 10/325 mg # 90 tabs.

## 2013-12-30 ENCOUNTER — Telehealth: Payer: Self-pay | Admitting: Internal Medicine

## 2013-12-30 DIAGNOSIS — G894 Chronic pain syndrome: Secondary | ICD-10-CM

## 2013-12-30 NOTE — Telephone Encounter (Signed)
Medication refill request for oxyCODONE-acetaminophen (PERCOCET) 10-325 MG per tablet / LOV 11/14/2013

## 2013-12-31 MED ORDER — OXYCODONE-ACETAMINOPHEN 10-325 MG PO TABS: 1.0000 | ORAL_TABLET | Freq: Four times a day (QID) | ORAL | Status: DC | PRN

## 2013-12-31 NOTE — Telephone Encounter (Signed)
Prescription refilled for Oxycodone-APAP 10/325 mg #90 tabs.

## 2014-01-08 ENCOUNTER — Ambulatory Visit (INDEPENDENT_AMBULATORY_CARE_PROVIDER_SITE_OTHER): Payer: Medicare Other | Admitting: Family Medicine

## 2014-01-08 ENCOUNTER — Other Ambulatory Visit: Payer: Self-pay | Admitting: Family Medicine

## 2014-01-08 ENCOUNTER — Encounter: Payer: Self-pay | Admitting: Family Medicine

## 2014-01-08 VITALS — BP 108/58 | HR 60 | Temp 97.0°F | Resp 20 | Ht 73.0 in | Wt 149.0 lb

## 2014-01-08 DIAGNOSIS — D571 Sickle-cell disease without crisis: Secondary | ICD-10-CM

## 2014-01-08 DIAGNOSIS — Z202 Contact with and (suspected) exposure to infections with a predominantly sexual mode of transmission: Secondary | ICD-10-CM | POA: Diagnosis not present

## 2014-01-08 DIAGNOSIS — Z113 Encounter for screening for infections with a predominantly sexual mode of transmission: Secondary | ICD-10-CM | POA: Diagnosis not present

## 2014-01-08 DIAGNOSIS — G894 Chronic pain syndrome: Secondary | ICD-10-CM | POA: Diagnosis not present

## 2014-01-08 LAB — CBC WITH DIFFERENTIAL/PLATELET
Basophils Absolute: 0.1 10*3/uL (ref 0.0–0.1)
Basophils Relative: 1 % (ref 0–1)
EOS PCT: 5 % (ref 0–5)
Eosinophils Absolute: 0.4 10*3/uL (ref 0.0–0.7)
HEMATOCRIT: 25.5 % — AB (ref 39.0–52.0)
HEMOGLOBIN: 8.9 g/dL — AB (ref 13.0–17.0)
LYMPHS PCT: 45 % (ref 12–46)
Lymphs Abs: 3.7 10*3/uL (ref 0.7–4.0)
MCH: 31.1 pg (ref 26.0–34.0)
MCHC: 34.9 g/dL (ref 30.0–36.0)
MCV: 89.2 fL (ref 78.0–100.0)
MONO ABS: 0.5 10*3/uL (ref 0.1–1.0)
MONOS PCT: 6 % (ref 3–12)
Neutro Abs: 3.5 10*3/uL (ref 1.7–7.7)
Neutrophils Relative %: 43 % (ref 43–77)
Platelets: 606 10*3/uL — ABNORMAL HIGH (ref 150–400)
RBC: 2.86 MIL/uL — ABNORMAL LOW (ref 4.22–5.81)
RDW: 19.3 % — ABNORMAL HIGH (ref 11.5–15.5)
WBC: 8.2 10*3/uL (ref 4.0–10.5)

## 2014-01-08 LAB — COMPLETE METABOLIC PANEL WITH GFR
ALT: 18 U/L (ref 0–53)
AST: 42 U/L — AB (ref 0–37)
Albumin: 4.2 g/dL (ref 3.5–5.2)
Alkaline Phosphatase: 63 U/L (ref 39–117)
BILIRUBIN TOTAL: 1.8 mg/dL — AB (ref 0.2–1.2)
BUN: 6 mg/dL (ref 6–23)
CHLORIDE: 103 meq/L (ref 96–112)
CO2: 25 mEq/L (ref 19–32)
Calcium: 9 mg/dL (ref 8.4–10.5)
Creat: 0.75 mg/dL (ref 0.50–1.35)
GFR, Est Non African American: >89 mL/min
Glucose, Bld: 83 mg/dL (ref 70–99)
Potassium: 4.1 mEq/L (ref 3.5–5.3)
SODIUM: 138 meq/L (ref 135–145)
Total Protein: 7.6 g/dL (ref 6.0–8.3)

## 2014-01-08 NOTE — Progress Notes (Signed)
   Subjective:    Patient ID: [name], male    DOB: April 23, 1981, 33 y.o.   MRN: [id number]  HPI Patient presents for a follow up of sickle cell disease, HbSS. Reports that sickle cell disease is stable. He takes pain medicine when needed.    Review of Systems  Constitutional: Negative.   HENT: Negative.   Eyes: Negative.   Cardiovascular: Negative.   Gastrointestinal: Negative.   Endocrine: Negative.   Genitourinary: Negative.   Musculoskeletal: Positive for myalgias.  Allergic/Immunologic: Negative.   Neurological: Negative.   Hematological: Negative.   Psychiatric/Behavioral: Negative.        Objective:   Physical Exam  Constitutional: He is oriented to person, place, and time. He appears well-developed and well-nourished.  HENT:  Head: Normocephalic and atraumatic.  Eyes: Conjunctivae are normal. Pupils are equal, round, and reactive to light.  Neck: Normal range of motion. Neck supple.  Cardiovascular: Normal rate, regular rhythm and normal heart sounds.   Pulmonary/Chest: Effort normal and breath sounds normal.  Abdominal: Soft. Bowel sounds are normal.  Musculoskeletal: Normal range of motion.  Neurological: He is alert and oriented to person, place, and time.  Skin: Skin is warm and dry.  Psychiatric: He has a normal mood and affect. His behavior is normal.          Assessment & Plan:  1. Sickle cell disease - Continue Hydrea 500 mg twice daily. We discussed the need for good hydration, monitoring of hydration status, avoidance of heat, cold, stress, and infection triggers. We discussed the risks and benefits of Hydrea, including bone marrow suppression, the possibility of GI upset, skin ulcers, hair thinning, and teratogenicity. The patient was reminded of the need to seek medical attention of any symptoms of bleeding, anemia, or infection. Continue folic acid 1 mg daily to prevent aplastic bone marrow crises  2. Eye - High risk of proliferative retinopathy.  Annual eye exam with retinal exam recommended to patient. Will refer to opthalmologist  3. Immunization status - Patient up to date with vaccinations  4.  Vitamin D deficiency - Continue Vitamin D 1000 units daily. Will re-check during complete physical examination.   5. Screen for sexually transmitted diseases: Patient reports that he recently changed partners and concerned of possible exposure. Will check for STDs   Labs: Hemoglobinathy eval, CBCw/diff, CMP, RPR, HIV, GC/Chlamydia RTC: 3 months  Dorena Dew, FNP

## 2014-01-09 DIAGNOSIS — Z113 Encounter for screening for infections with a predominantly sexual mode of transmission: Secondary | ICD-10-CM | POA: Insufficient documentation

## 2014-01-09 LAB — STD PANEL
HIV 1&2 Ab, 4th Generation: NONREACTIVE
Hepatitis B Surface Ag: NEGATIVE

## 2014-01-09 LAB — GC/CHLAMYDIA PROBE AMP
CT Probe RNA: NEGATIVE
GC PROBE AMP APTIMA: NEGATIVE

## 2014-01-10 LAB — HEMOGLOBINOPATHY EVALUATION
HEMOGLOBIN OTHER: 0 %
HGB A2 QUANT: 3.5 % — AB (ref 2.2–3.2)
HGB S QUANTITAION: 76.9 % — AB
Hgb A: 0 % — ABNORMAL LOW (ref 96.8–97.8)
Hgb F Quant: 19.6 % — ABNORMAL HIGH (ref 0.0–2.0)

## 2014-01-30 ENCOUNTER — Telehealth: Payer: Self-pay | Admitting: Internal Medicine

## 2014-01-30 DIAGNOSIS — G894 Chronic pain syndrome: Secondary | ICD-10-CM

## 2014-02-03 MED ORDER — OXYCODONE-ACETAMINOPHEN 10-325 MG PO TABS: 1.0000 | ORAL_TABLET | Freq: Four times a day (QID) | ORAL | Status: DC | PRN

## 2014-02-03 NOTE — Telephone Encounter (Signed)
Percocet 10-325 mg every 6 hours for moderate to severe pain #90  Reviewed [place] Substance Reporting system prior to reorder

## 2014-02-26 ENCOUNTER — Telehealth: Payer: Self-pay | Admitting: Internal Medicine

## 2014-02-26 MED ORDER — HYDROXYUREA 500 MG PO CAPS: 1000.0000 mg | ORAL_CAPSULE | Freq: Two times a day (BID) | ORAL | Status: DC

## 2014-02-26 NOTE — Telephone Encounter (Signed)
Refill request for hydrea sent electronically to pharmacy

## 2014-03-03 ENCOUNTER — Telehealth: Payer: Self-pay | Admitting: Internal Medicine

## 2014-03-03 DIAGNOSIS — G894 Chronic pain syndrome: Secondary | ICD-10-CM

## 2014-03-03 MED ORDER — OXYCODONE-ACETAMINOPHEN 10-325 MG PO TABS: 1.0000 | ORAL_TABLET | Freq: Four times a day (QID) | ORAL | Status: DC | PRN

## 2014-03-03 NOTE — Telephone Encounter (Signed)
Medication refill for percocet. LOV 01/08/2014

## 2014-03-03 NOTE — Telephone Encounter (Signed)
Percocet 10-325 mg every 6 hours as needed for severe pain #90  Reviewed [place] Substance Reporting system prior to reorder. Patient will need to speak with provider prior to receiving Rx.  Rx not to be filled until 03/06/2014

## 2014-03-06 ENCOUNTER — Other Ambulatory Visit: Payer: Self-pay

## 2014-03-06 ENCOUNTER — Other Ambulatory Visit: Payer: Self-pay | Admitting: Family Medicine

## 2014-03-06 ENCOUNTER — Other Ambulatory Visit: Payer: Medicare Other

## 2014-03-06 DIAGNOSIS — G894 Chronic pain syndrome: Secondary | ICD-10-CM

## 2014-03-07 LAB — DRUG SCREEN, URINE, NO CONFIRMATION
Amphetamine Screen, Ur: NEGATIVE
BARBITURATE QUANT UR: NEGATIVE
Benzodiazepines.: NEGATIVE
CREATININE, U: 99.3 mg/dL
Cocaine Metabolites: NEGATIVE
METHADONE: NEGATIVE
Marijuana Metabolite: NEGATIVE
OPIATE SCREEN, URINE: NEGATIVE
Phencyclidine (PCP): NEGATIVE
Propoxyphene: NEGATIVE

## 2014-03-17 ENCOUNTER — Non-Acute Institutional Stay (HOSPITAL_COMMUNITY)
Admission: AD | Admit: 2014-03-17 | Discharge: 2014-03-17 | Disposition: A | Discharge status: Home / Self Care | Payer: Medicare Other | Attending: Internal Medicine | Admitting: Internal Medicine

## 2014-03-17 ENCOUNTER — Encounter (HOSPITAL_COMMUNITY): Payer: Self-pay

## 2014-03-17 ENCOUNTER — Encounter (HOSPITAL_COMMUNITY): Payer: Self-pay | Admitting: Internal Medicine

## 2014-03-17 DIAGNOSIS — D571 Sickle-cell disease without crisis: Secondary | ICD-10-CM

## 2014-03-17 DIAGNOSIS — D5700 Hb-SS disease with crisis, unspecified: Secondary | ICD-10-CM | POA: Diagnosis not present

## 2014-03-17 LAB — COMPREHENSIVE METABOLIC PANEL
ALK PHOS: 74 U/L (ref 39–117)
ALT: 81 U/L — ABNORMAL HIGH (ref 0–53)
ANION GAP: 12 (ref 5–15)
AST: 71 U/L — ABNORMAL HIGH (ref 0–37)
Albumin: 4.2 g/dL (ref 3.5–5.2)
BUN: 4 mg/dL — AB (ref 6–23)
CO2: 25 mEq/L (ref 19–32)
CREATININE: 0.73 mg/dL (ref 0.50–1.35)
Calcium: 9.5 mg/dL (ref 8.4–10.5)
Chloride: 99 mEq/L (ref 96–112)
GFR calc non Af Amer: >90 mL/min (ref >90)
Glucose, Bld: 123 mg/dL — ABNORMAL HIGH (ref 70–99)
Potassium: 4.3 mEq/L (ref 3.7–5.3)
Sodium: 136 mEq/L — ABNORMAL LOW (ref 137–147)
Total Bilirubin: 3.2 mg/dL — ABNORMAL HIGH (ref 0.3–1.2)
Total Protein: 8.5 g/dL — ABNORMAL HIGH (ref 6.0–8.3)

## 2014-03-17 LAB — RETICULOCYTES
RBC.: 2.78 MIL/uL — ABNORMAL LOW (ref 4.22–5.81)
Retic Count, Absolute: 283.6 10*3/uL — ABNORMAL HIGH (ref 19.0–186.0)
Retic Ct Pct: 10.2 % — ABNORMAL HIGH (ref 0.4–3.1)

## 2014-03-17 LAB — CBC WITH DIFFERENTIAL/PLATELET
BASOS PCT: 0 % (ref 0–1)
Basophils Absolute: 0 10*3/uL (ref 0.0–0.1)
Eosinophils Absolute: 0.1 10*3/uL (ref 0.0–0.7)
Eosinophils Relative: 1 % (ref 0–5)
HEMATOCRIT: 24.5 % — AB (ref 39.0–52.0)
HEMOGLOBIN: 8.8 g/dL — AB (ref 13.0–17.0)
LYMPHS ABS: 2.4 10*3/uL (ref 0.7–4.0)
Lymphocytes Relative: 23 % (ref 12–46)
MCH: 31.7 pg (ref 26.0–34.0)
MCHC: 35.9 g/dL (ref 30.0–36.0)
MCV: 88.1 fL (ref 78.0–100.0)
MONO ABS: 0.5 10*3/uL (ref 0.1–1.0)
MONOS PCT: 5 % (ref 3–12)
NEUTROS ABS: 7.4 10*3/uL (ref 1.7–7.7)
Neutrophils Relative %: 71 % (ref 43–77)
Platelets: 648 10*3/uL — ABNORMAL HIGH (ref 150–400)
RBC: 2.78 MIL/uL — AB (ref 4.22–5.81)
RDW: 18.7 % — ABNORMAL HIGH (ref 11.5–15.5)
WBC: 10.5 10*3/uL (ref 4.0–10.5)

## 2014-03-17 MED ORDER — DEXTROSE-NACL 5-0.45 % IV SOLN
INTRAVENOUS | Status: DC
  Administered 2014-03-17: 15:00:00 via INTRAVENOUS

## 2014-03-17 MED ORDER — OXYCODONE HCL 5 MG PO TABS
5.0000 mg | ORAL_TABLET | Freq: Once | ORAL | Status: AC
  Administered 2014-03-17: 5 mg via ORAL
  Filled 2014-03-17: qty 1

## 2014-03-17 MED ORDER — HYDROMORPHONE HCL PF 2 MG/ML IJ SOLN
1.6000 mg | Freq: Every morning | INTRAMUSCULAR | Status: DC
  Administered 2014-03-17: 1.6 mg via INTRAVENOUS
  Filled 2014-03-17: qty 1

## 2014-03-17 MED ORDER — KETOROLAC TROMETHAMINE 30 MG/ML IJ SOLN
30.0000 mg | Freq: Once | INTRAMUSCULAR | Status: AC
  Administered 2014-03-17: 30 mg via INTRAVENOUS
  Filled 2014-03-17: qty 1

## 2014-03-17 MED ORDER — OXYCODONE-ACETAMINOPHEN 5-325 MG PO TABS
1.0000 | ORAL_TABLET | Freq: Once | ORAL | Status: AC
  Administered 2014-03-17: 1 via ORAL
  Filled 2014-03-17: qty 1

## 2014-03-17 NOTE — Discharge Summary (Signed)
I agree with the above history, physical, assessment and plan. Pt's pain improved after treatment in day hospital. Pt will be discharged home.

## 2014-03-17 NOTE — Progress Notes (Signed)
Patient ID: [name], male   DOB: 1980-10-26, 33 y.o.   MRN: [id number] Pt states has had pain in shoulders and hips; rates pain 7/10; states taken home medications with no relief; pt denies CP, SOB, nausea/vomiting/diarrhea; denies fever; MD notified

## 2014-03-17 NOTE — H&P (Signed)
I agree with the above history, physical, assessment and plan. Pt to be admitted for further treatment of sickle cell crisis. Please see admission H&P.

## 2014-03-17 NOTE — H&P (Signed)
[place] History and Physical  [name] HGD:[id number] DOB: 07/07/1981 DOA: 03/17/2014   PCP: MATTHEWS,MICHELLE A., MD   Chief Complaint:   HPI: Patient with a history of sickle cell disease, Hb SS presents with increased pain to bilateral shoulders, right arm, and hip. Patient attributes current pain crisis to going swimming on Saturday, 03/15/2014 in a chlorinated pool. He says that pain progressed gradually throughout the weekend.  He states that he has increased weakness to right arm related to current pain intensity. Current pain intensity is 5/10 described as constant and throbbing  He maintains that he has been taking his medications consistently and increased hydration. He denies headache, fever, chest pain, nausea, vomiting, and diarrhea. Will admit patient to the day infusion center for extended observation.    Review of Systems:  Constitutional: No weight loss, night sweats, Fevers, chills, fatigue.  HEENT: No headaches, dizziness, seizures, vision changes, difficulty swallowing,Tooth/dental problems,Sore throat, No sneezing, itching, ear ache, nasal congestion, post nasal drip,  Cardio-vascular: No chest pain, Orthopnea, PND, swelling in lower extremities, anasarca, dizziness, palpitations  GI: No heartburn, indigestion, abdominal pain, nausea, vomiting, diarrhea, change in bowel habits, loss of appetite  Resp: No shortness of breath with exertion or at rest. No excess mucus, no productive cough, No non-productive cough, No coughing up of blood.No change in color of mucus.No wheezing.No chest wall deformity  Skin: no rash or lesions.  GU: no dysuria, change in color of urine, no urgency or frequency. No flank pain.  Musculoskeletal: Pain to upper extremities, and hips. Decreased range of motion to right upper extremity. No back pain.  Psych: No change in mood or affect. No depression or anxiety. No memory loss.    Past Medical History  Diagnosis Date  .  Sickle cell anemia    Past Surgical History  Procedure Laterality Date  . Cholecystectomy     Social History:  reports that he has never smoked. He does not have any smokeless tobacco history on file. He reports that he does not drink alcohol or use illicit drugs.  No Known Allergies  Family History  Problem Relation Age of Onset  . Sickle cell anemia Mother   . Sickle cell trait Father   . Sickle cell trait Sister   . Sickle cell trait Brother   . Cancer Maternal Grandmother     Prior to Admission medications   Medication Sig Start Date End Date Taking? Authorizing Provider  folic acid (FOLVITE) 962 MCG tablet Take 400 mcg by mouth daily.   Yes Historical Provider, MD  hydroxyurea (HYDREA) 500 MG capsule Take 2 capsules (1,000 mg total) by mouth 2 (two) times daily. May take with food to minimize GI side effects. 02/26/14  Yes Dorena Dew, FNP  ibuprofen (ADVIL,MOTRIN) 600 MG tablet Take 1 tablet (600 mg total) by mouth every 6 (six) hours as needed for mild pain or moderate pain. 11/14/13  Yes Dorena Dew, FNP  oxyCODONE-acetaminophen (PERCOCET) 10-325 MG per tablet Take 1 tablet by mouth every 6 (six) hours as needed for pain. 03/03/14  Yes Dorena Dew, FNP  cholecalciferol (VITAMIN D) 1000 UNITS tablet Take 1,000 Units by mouth daily.    Historical Provider, MD   Physical Exam: Filed Vitals:   03/17/14 1410  BP: 128/72  Pulse: 64  Temp: 98 F (36.7 C)  TempSrc: Oral  Resp: 20  Height: 6\' 2"  (1.88 m)  Weight: 149 lb (67.586 kg)  SpO2: 97%   General: Alert,  awake, oriented x3, in mild acute distress.  HEENT: [place]/AT PEERL, EOMI Neck: Trachea midline,  no masses, no thyromegal,y no JVD, no carotid bruit OROPHARYNX:  Moist, No exudate/ erythema/lesions.  Heart: Regular rate and rhythm, without murmurs, rubs, gallops, PMI non-displaced, no heaves or thrills on palpation.  Lungs: Clear to auscultation, no wheezing or rhonchi noted. No increased vocal fremitus  resonant to percussion  Abdomen: Soft, nontender, nondistended, positive bowel sounds, no masses no hepatosplenomegaly noted..  Neuro: No focal neurological deficits noted cranial nerves II through XII grossly intact. Neuro: A+OX3, 3/5 distal strength to right upper extremity, nl sensory Musculoskeletal: No edema to extremities, weakness to right upper extremity.   Labs on Admission:   Basic Metabolic Panel: No results found for this basename: NA, K, CL, CO2, GLUCOSE, BUN, CREATININE, CALCIUM, MG, PHOS,  in the last 168 hours Liver Function Tests: No results found for this basename: AST, ALT, ALKPHOS, BILITOT, PROT, ALBUMIN,  in the last 168 hours CBC: No results found for this basename: WBC, NEUTROABS, HGB, HCT, MCV, PLT,  in the last 168 hours BNP: No components found with this basename: POCBNP,    Radiological Exams on Admission: No results found.  EKG: Independently reviewed. None  Assessment/Plan: Active Problems:   * No active hospital problems. *  1. Sickle cell pain: Start hypotonic IV fluids for cellular re-hydration.  Give 30 mg Toradol IV  Inflammation Start IV dilaudid per weight based rapid re-dosing, will re-assess pain during extended observation Will check CBC w/ diff, CMP, and reticulocytes Time spend: 45 minutes Code Status: Full  Family Communication: None Disposition Plan: Home when stable  . Pager [phone]  If 7PM-7AM, please contact night-coverage [email or web address] Password [email or web address] 03/17/2014, 2:18 PM

## 2014-03-17 NOTE — Progress Notes (Signed)
Pt discharged to home; discharge instructions explained, given, and signed with all questions answered; pt alert and oriented; no complications noted

## 2014-03-17 NOTE — Discharge Summary (Signed)
Physician Discharge Summary  [name] [id number]:[id number] DOB: 01/11/1981 DOA: 03/17/2014  PCP: MATTHEWS,MICHELLE A., MD  Admit date: 03/17/2014 Discharge date: 03/17/2014  Discharge Diagnoses:  Active Problems:   Sickle cell anemia with crisis   Discharge Condition: Stable  Disposition:   Diet:Regular Wt Readings from Last 3 Encounters:  03/17/14 149 lb (67.586 kg)  01/08/14 149 lb (67.586 kg)  11/14/13 138 lb (62.596 kg)    Hospital Course:  Patient was admitted to the day infusion center for extended observation. Patient was started on D5.45 for cellular re-hydration, patient is eating and drinking, he appears re-hydrated. Given Toradol 30 mg IV and Dilaudid 1.6 mg times 1. Pain intensity decreased to 3/10. Patient states that he can manage at home on current medication regimen. Given 1 dose of Percocet 10-325 forty minutes prior to discharge. Pain intensity remains at 3/10. Reviewed and discussed labs, patient is to follow up in office with Dr. Zigmund Daniel as scheduled.   Discharge Exam:  Filed Vitals:   03/17/14 1410  BP: 128/72  Pulse: 64  Temp: 98 F (36.7 C)  Resp: 20   Filed Vitals:   03/17/14 1410  BP: 128/72  Pulse: 64  Temp: 98 F (36.7 C)  TempSrc: Oral  Resp: 20  Height: 6\' 2"  (1.88 m)  Weight: 149 lb (67.586 kg)  SpO2: 97%     General: Alert, awake, oriented x3, in no acute distress.  HEENT: [place]/AT PEERL, EOMI Neck: Trachea midline,  no masses, no thyromegal,y no JVD, no carotid bruit OROPHARYNX:  Moist, No exudate/ erythema/lesions.  Heart: Regular rate and rhythm, without murmurs, rubs, gallops, PMI non-displaced, no heaves or thrills on palpation.  Lungs: Clear to auscultation, no wheezing or rhonchi noted. No increased vocal fremitus resonant to percussion  Abdomen: Soft, nontender, nondistended, positive bowel sounds, no masses no hepatosplenomegaly noted..  Neuro: No focal neurological deficits noted cranial nerves II through XII grossly intact.  DTRs 2+ bilaterally upper and lower extremities. Strength 5 out of 5 in bilateral upper and lower extremities. Musculoskeletal: No warm swelling or erythema around joints, no spinal tenderness noted. No decreased range of motion Psychiatric: Patient alert and oriented x3, good insight and cognition, good recent to remote recall. Lymph node survey: No cervical axillary or inguinal lymphadenopathy noted.   Discharge Instructions     Medication List    ASK your doctor about these medications       cholecalciferol 1000 UNITS tablet  Commonly known as:  VITAMIN D  Take 1,000 Units by mouth daily.     folic acid 768 MCG tablet  Commonly known as:  FOLVITE  Take 400 mcg by mouth daily.     hydroxyurea 500 MG capsule  Commonly known as:  HYDREA  Take 2 capsules (1,000 mg total) by mouth 2 (two) times daily. May take with food to minimize GI side effects.     ibuprofen 600 MG tablet  Commonly known as:  ADVIL,MOTRIN  Take 1 tablet (600 mg total) by mouth every 6 (six) hours as needed for mild pain or moderate pain.     oxyCODONE-acetaminophen 10-325 MG per tablet  Commonly known as:  PERCOCET  Take 1 tablet by mouth every 6 (six) hours as needed for pain.          The results of significant diagnostics from this hospitalization (including imaging, microbiology, ancillary and laboratory) are listed below for reference.    Significant Diagnostic Studies: No results found.  Microbiology: No results found for this or  any previous visit (from the past 240 hour(s)).   Labs: Basic Metabolic Panel:  Recent Labs Lab 03/17/14 1457  NA 136*  K 4.3  CL 99  CO2 25  GLUCOSE 123*  BUN 4*  CREATININE 0.73  CALCIUM 9.5   Liver Function Tests:  Recent Labs Lab 03/17/14 1457  AST 71*  ALT 81*  ALKPHOS 74  BILITOT 3.2*  PROT 8.5*  ALBUMIN 4.2   No results found for this basename: LIPASE, AMYLASE,  in the last 168 hours No results found for this basename: AMMONIA,  in the  last 168 hours CBC:  Recent Labs Lab 03/17/14 1457  WBC 10.5  NEUTROABS 7.4  HGB 8.8*  HCT 24.5*  MCV 88.1  PLT 648*   Cardiac Enzymes: No results found for this basename: CKTOTAL, CKMB, CKMBINDEX, TROPONINI,  in the last 168 hours BNP: No components found with this basename: POCBNP,  CBG: No results found for this basename: GLUCAP,  in the last 168 hours Ferritin: No results found for this basename: FERRITIN,  in the last 168 hours  Time coordinating discharge: Greater than 30 minutes  Signed:  Tesslyn Baumert M  03/17/2014, 4:56 PM

## 2014-03-19 ENCOUNTER — Telehealth: Payer: Self-pay | Admitting: Internal Medicine

## 2014-03-19 NOTE — Telephone Encounter (Signed)
Patient requesting a refill on Percocet 10/325mg . LOV 01/08/2014. Please advise. Thanks!

## 2014-03-21 ENCOUNTER — Ambulatory Visit (INDEPENDENT_AMBULATORY_CARE_PROVIDER_SITE_OTHER): Payer: Medicare Other | Admitting: Family Medicine

## 2014-03-21 ENCOUNTER — Encounter: Payer: Self-pay | Admitting: Family Medicine

## 2014-03-21 VITALS — BP 115/44 | HR 57 | Temp 97.6°F | Resp 14 | Ht 73.0 in | Wt 142.0 lb

## 2014-03-21 DIAGNOSIS — G894 Chronic pain syndrome: Secondary | ICD-10-CM

## 2014-03-21 DIAGNOSIS — D571 Sickle-cell disease without crisis: Secondary | ICD-10-CM

## 2014-03-21 MED ORDER — OXYCODONE-ACETAMINOPHEN 10-325 MG PO TABS: 1.0000 | ORAL_TABLET | ORAL | Status: DC | PRN

## 2014-03-21 MED ORDER — IBUPROFEN 600 MG PO TABS: 600.0000 mg | ORAL_TABLET | Freq: Four times a day (QID) | ORAL | Status: DC | PRN

## 2014-03-21 NOTE — Progress Notes (Signed)
   Subjective:    Patient ID: [name], male    DOB: [birth year], 33 y.o.   MRN: [id number]  HPI  Patient presents for a follow up of sickle cell disease, HbSS prior to travel to [place] by car and later a plane ride to [facility] over a 3 week period. Reports that sickle cell disease is pain has increased in frequency and severity over the past few weeks. He reports that he is taking pain medications as prescribed, but pain intensity remains 4-5/10 described as throbbing and intermittent to right shoulder.  Patient states that he last had Ibuprofen and Percocet this am, which moderately reduced pain intensity. He says that he has not been taking Hydrea consistently.  Patient currently denies chest pain,  nausea, vomiting, diarrhea,.    Past Medical History  Diagnosis Date  . Sickle cell anemia     Review of Systems  Constitutional: Negative.   HENT: Negative.   Eyes: Negative.   Cardiovascular: Negative.   Gastrointestinal: Negative.   Endocrine: Negative.   Genitourinary: Negative.   Musculoskeletal: Positive for myalgias.  Allergic/Immunologic: Negative.   Neurological: Negative.   Hematological: Negative.   Psychiatric/Behavioral: Negative.        Objective:   Physical Exam  Constitutional: He is oriented to person, place, and time. He appears well-developed and well-nourished.  HENT:  Head: Normocephalic and atraumatic.  Eyes: Conjunctivae are normal. Pupils are equal, round, and reactive to light.  Neck: Normal range of motion. Neck supple.  Cardiovascular: Normal rate, regular rhythm and normal heart sounds.   Pulmonary/Chest: Effort normal and breath sounds normal.  Abdominal: Soft. Bowel sounds are normal.  Musculoskeletal: Normal range of motion.  Neurological: He is alert and oriented to person, place, and time.  Skin: Skin is warm and dry.  Psychiatric: He has a normal mood and affect. His behavior is normal.         BP 115/44  Pulse 57   Temp(Src) 97.6 F (36.4 C) (Oral)  Resp 14  Ht 6\' 1"  (1.854 m)  Wt 142 lb (64.411 kg)  BMI 18.74 kg/m2 Assessment & Plan:  1. Sickle cell disease - Continue Hydrea 500 mg twice daily. Stressed the importance of taking Hydrea consistently. We discussed the need for good hydration, monitoring of hydration status, avoidance of heat, cold, stress, and infection triggers. We discussed the risks and benefits of Hydrea, including bone marrow suppression, the possibility of GI upset, skin ulcers, hair thinning, and teratogenicity. The patient was reminded of the need to seek medical attention of any symptoms of bleeding, anemia, or infection. Continue folic acid 1 mg daily to prevent aplastic bone marrow crises Checked CBC and CMP on 03/17/2014. Percocet 10-325 mg every 4 hours #90, medication not to be filled prior to 03/26/2014. Reviewed [place] Substance Reporting system prior to reorder.     2. Eye - High risk of proliferative retinopathy. Annual eye exam with retinal exam recommended to patient. Referred to opthalmologist previously  3. Immunization status - Patient up to date with vaccinations  4.  Vitamin D deficiency - Continue Vitamin D 1000 units daily. Will re-check during complete physical examination.     Labs: reviewed  RTC: 3 months for re-check with Dr. Zigmund Daniel. Will need influenzae vaccination at this appointment.   Dorena Dew, FNP

## 2014-03-26 NOTE — Telephone Encounter (Signed)
Pt called @ 0730 to ask to be seen for pain evaluation. Dr. Marlou Sa paged, orders given to bring pt in clinic today. Pt called and instructed to report to clinic today for evaluation   By Cora Collum, RN

## 2014-04-10 ENCOUNTER — Telehealth (HOSPITAL_COMMUNITY): Payer: Self-pay | Admitting: *Deleted

## 2014-04-10 ENCOUNTER — Ambulatory Visit: Payer: Medicare Other | Admitting: Internal Medicine

## 2014-04-10 NOTE — Telephone Encounter (Signed)
Informed patient of [facility] Sickle Cell Anemia: [facility], Body, & Spirit on Friday, Sept. 18, 2015. Patient acknowledges.  

## 2014-04-16 ENCOUNTER — Telehealth: Payer: Self-pay | Admitting: Internal Medicine

## 2014-04-16 DIAGNOSIS — G894 Chronic pain syndrome: Secondary | ICD-10-CM

## 2014-04-16 NOTE — Telephone Encounter (Signed)
Percocet 10/325mg  refill request. LOV 03/21/2014. Please advise. Thanks!

## 2014-04-17 MED ORDER — OXYCODONE-ACETAMINOPHEN 10-325 MG PO TABS: 1.0000 | ORAL_TABLET | ORAL | Status: DC | PRN

## 2014-04-17 NOTE — Telephone Encounter (Signed)
Meds ordered this encounter  Medications  . oxyCODONE-acetaminophen (PERCOCET) 10-325 MG per tablet    Sig: Take 1 tablet by mouth every 4 (four) hours as needed for pain.    Dispense:  90 tablet    Refill:  0    Order Specific Question:  Supervising Provider    Answer:  Liston Alba A [[id number]]  Hollis,Lachina M, FNP  Reviewed [place] Substance Reporting system prior to reorder

## 2014-05-28 ENCOUNTER — Telehealth: Payer: Self-pay | Admitting: Internal Medicine

## 2014-05-28 DIAGNOSIS — G894 Chronic pain syndrome: Secondary | ICD-10-CM

## 2014-05-28 MED ORDER — OXYCODONE-ACETAMINOPHEN 10-325 MG PO TABS: 1.0000 | ORAL_TABLET | ORAL | Status: DC | PRN

## 2014-05-28 NOTE — Telephone Encounter (Signed)
Meds ordered this encounter  Medications  . oxyCODONE-acetaminophen (PERCOCET) 10-325 MG per tablet    Sig: Take 1 tablet by mouth every 4 (four) hours as needed for pain.    Dispense:  90 tablet    Refill:  0    Rx is not to be filled prior to 06/01/2014    Order Specific Question:  Supervising Provider    Answer:  Liston Alba A [[id number]]  Dorena Dew, FNP Reviewed [place] Substance Reporting system prior to reorder

## 2014-05-28 NOTE — Telephone Encounter (Signed)
Refill request for percocet 10/325mg . LOV 03/21/2014 Please advise. Thanks!

## 2014-07-03 ENCOUNTER — Ambulatory Visit (INDEPENDENT_AMBULATORY_CARE_PROVIDER_SITE_OTHER): Payer: Medicare Other | Admitting: Internal Medicine

## 2014-07-03 VITALS — BP 119/45 | HR 71 | Temp 98.0°F | Resp 16 | Ht 73.0 in | Wt 147.0 lb

## 2014-07-03 DIAGNOSIS — Z23 Encounter for immunization: Secondary | ICD-10-CM

## 2014-07-03 DIAGNOSIS — D571 Sickle-cell disease without crisis: Secondary | ICD-10-CM

## 2014-07-03 DIAGNOSIS — G894 Chronic pain syndrome: Secondary | ICD-10-CM | POA: Diagnosis not present

## 2014-07-03 MED ORDER — OXYCODONE-ACETAMINOPHEN 10-325 MG PO TABS: 1.0000 | ORAL_TABLET | ORAL | Status: DC | PRN

## 2014-07-04 ENCOUNTER — Other Ambulatory Visit: Payer: Self-pay | Admitting: Internal Medicine

## 2014-07-04 DIAGNOSIS — D571 Sickle-cell disease without crisis: Secondary | ICD-10-CM

## 2014-07-04 LAB — URINALYSIS
BILIRUBIN URINE: NEGATIVE
Glucose, UA: NEGATIVE mg/dL
Hgb urine dipstick: NEGATIVE
Ketones, ur: NEGATIVE mg/dL
Leukocytes, UA: NEGATIVE
NITRITE: NEGATIVE
Protein, ur: NEGATIVE mg/dL
SPECIFIC GRAVITY, URINE: 1.014 (ref 1.005–1.030)
UROBILINOGEN UA: 1 mg/dL (ref 0.0–1.0)
pH: 7 (ref 5.0–8.0)

## 2014-07-04 LAB — CBC WITH DIFFERENTIAL/PLATELET
Basophils Absolute: 0 10*3/uL (ref 0.0–0.1)
Basophils Relative: 0 % (ref 0–1)
EOS ABS: 0.2 10*3/uL (ref 0.0–0.7)
Eosinophils Relative: 2 % (ref 0–5)
HEMATOCRIT: 25.7 % — AB (ref 39.0–52.0)
Hemoglobin: 9.1 g/dL — ABNORMAL LOW (ref 13.0–17.0)
Lymphocytes Relative: 36 % (ref 12–46)
Lymphs Abs: 2.8 10*3/uL (ref 0.7–4.0)
MCH: 30.8 pg (ref 26.0–34.0)
MCHC: 35.4 g/dL (ref 30.0–36.0)
MCV: 87.1 fL (ref 78.0–100.0)
MONOS PCT: 10 % (ref 3–12)
MPV: 8.6 fL — ABNORMAL LOW (ref 9.4–12.4)
Monocytes Absolute: 0.8 10*3/uL (ref 0.1–1.0)
NEUTROS ABS: 4.1 10*3/uL (ref 1.7–7.7)
Neutrophils Relative %: 52 % (ref 43–77)
Platelets: 471 10*3/uL — ABNORMAL HIGH (ref 150–400)
RBC: 2.95 MIL/uL — ABNORMAL LOW (ref 4.22–5.81)
RDW: 18.3 % — AB (ref 11.5–15.5)
WBC: 7.8 10*3/uL (ref 4.0–10.5)

## 2014-07-04 LAB — RETICULOCYTES
ABS Retic: 256.7 10*3/uL — ABNORMAL HIGH (ref 19.0–186.0)
RBC.: 2.95 MIL/uL — AB (ref 4.22–5.81)
Retic Ct Pct: 8.7 % — ABNORMAL HIGH (ref 0.4–2.3)

## 2014-07-04 NOTE — Progress Notes (Signed)
Patient ID: [name], male   DOB: [birth year], 33 y.o.   MRN: [id number]   [name], is a 33 y.o. male  WFU:[id number]  KGU:[id number]  DOB - May 17, 1981  CC:  Chief Complaint  Patient presents with  . Follow-up       HPI: [name] is a 33 y.o. male here today for follow up visit for Sickle cell disease. He has been doing well. He reports some increase in pain but has been able to manage it with oral analgesics. He has some pain in his left foot about two weeks ago however there was no impairment of function and the pain subsided within a day. He is overdue for his vision examination As his last examination was in September of last year. Patient has No headache, No chest pain, No abdominal pain - No Nausea, No new weakness tingling or numbness, No Cough - SOB.  No Known Allergies Past Medical History  Diagnosis Date  . Sickle cell anemia    Current Outpatient Prescriptions on File Prior to Visit  Medication Sig Dispense Refill  . cholecalciferol (VITAMIN D) 1000 UNITS tablet Take 1,000 Units by mouth daily.    . folic acid (FOLVITE) 628 MCG tablet Take 400 mcg by mouth daily.    . hydroxyurea (HYDREA) 500 MG capsule Take 2 capsules (1,000 mg total) by mouth 2 (two) times daily. May take with food to minimize GI side effects. 120 capsule 3  . ibuprofen (ADVIL,MOTRIN) 600 MG tablet Take 1 tablet (600 mg total) by mouth every 6 (six) hours as needed for mild pain or moderate pain. 30 tablet 0   No current facility-administered medications on file prior to visit.   Family History  Problem Relation Age of Onset  . Sickle cell anemia Mother   . Sickle cell trait Father   . Sickle cell trait Sister   . Sickle cell trait Brother   . Cancer Maternal Grandmother    History   Social History  . Marital Status: Single    Spouse Name: N/A    Number of Children: N/A  . Years of Education: N/A   Occupational History  . Not on file.   Social History Main Topics  .  Smoking status: Never Smoker   . Smokeless tobacco: Not on file  . Alcohol Use: No  . Drug Use: No  . Sexual Activity: Yes   Other Topics Concern  . Not on file   Social History Narrative  . No narrative on file    Review of Systems: Constitutional: Negative for fever, chills, diaphoresis, activity change, appetite change and fatigue. HENT: Negative for ear pain, nosebleeds, congestion, facial swelling, rhinorrhea, neck pain, neck stiffness and ear discharge.  Eyes: Negative for pain, discharge, redness, itching and visual disturbance. Respiratory: Negative for cough, choking, chest tightness, shortness of breath, wheezing and stridor.  Cardiovascular: Negative for chest pain, palpitations and leg swelling. Gastrointestinal: Negative for abdominal distention. Genitourinary: Negative for dysuria, urgency, frequency, hematuria, flank pain, decreased urine volume, difficulty urinating and dyspareunia.  Musculoskeletal: Negative for back pain, joint swelling, arthralgia and gait problem. Neurological: Negative for dizziness, tremors, seizures, syncope, facial asymmetry, speech difficulty, weakness, light-headedness, numbness and headaches.  Hematological: Negative for adenopathy. Does not bruise/bleed easily. Psychiatric/Behavioral: Negative for hallucinations, behavioral problems, confusion, dysphoric mood, decreased concentration and agitation.    Objective:         Filed Vitals:   07/03/14 1502  BP: 119/45  Pulse: 71  Temp: 98 F (36.7  C)  Resp: 16    Physical Exam: Constitutional: Patient appears well-developed and well-nourished. No distress. HENT: Normocephalic, atraumatic, External right and left ear normal. Oropharynx is clear and moist.  Eyes: Conjunctivae and EOM are normal. PERRLA, no scleral icterus. Neck: Normal ROM. Neck supple. No JVD. No tracheal deviation. No thyromegaly. CVS: RRR, S1/S2 +, no murmurs, no gallops, no carotid bruit.  Pulmonary: Effort and  breath sounds normal, no stridor, rhonchi, wheezes, rales.  Abdominal: Soft. BS +, no distension, tenderness, rebound or guarding.  Musculoskeletal: Normal range of motion. No edema and no tenderness.  Lymphadenopathy: No lymphadenopathy noted, cervical, inguinal or axillary Neuro: Alert. Normal reflexes, muscle tone coordination. No cranial nerve deficit. Skin: Skin is warm and dry. No rash noted. Not diaphoretic. No erythema. No pallor. Psychiatric: Normal mood and affect. Behavior, judgment, thought content normal.  Lab Results  Component Value Date   WBC 7.8 07/03/2014   HGB 9.1* 07/03/2014   HCT 25.7* 07/03/2014   MCV 87.1 07/03/2014   PLT 471* 07/03/2014   Lab Results  Component Value Date   CREATININE 0.73 03/17/2014   BUN 4* 03/17/2014   NA 136* 03/17/2014   K 4.3 03/17/2014   CL 99 03/17/2014   CO2 25 03/17/2014    No results found for: HGBA1C Lipid Panel  No results found for: CHOL, TRIG, HDL, CHOLHDL, VLDL, LDLCALC     Assessment and plan:   1.  Hb-SS disease without crisis - Sickle cell disease - Continue Hydrea 1000 mg twice daily. We discussed the need for good hydration, monitoring of hydration status, avoidance of heat, cold, stress, and infection triggers. We discussed the risks and benefits of Hydrea, including bone marrow suppression, the possibility of GI upset, skin ulcers, hair thinning, and teratogenicity. The patient was reminded of the need to seek medical attention of any symptoms of bleeding, anemia, or infection. Continue folic acid 1 mg daily to prevent aplastic bone marrow crises.   -  Pulmonary evaluation - Patient denies severe recurrent wheezes, shortness of breath with exercise, or persistent cough. If these symptoms develop, pulmonary function tests with spirometry will be ordered, and if abnormal, plan on referral to Pulmonology for further evaluation.  - Cardiac - Routine screening for pulmonary hypertension is not recommended.  - Eye -  High risk of proliferative retinopathy. Pt due for Annual examination. Will refer.  - Immunization status - He will receive yearly influenza vaccination today and is  up to date with Pneumococcal vaccine.   - Acute and chronic painful episodes - We agreed on oxyCODONE-acetaminophen (PERCOCET) 10-325 MG per tablet; Take 1 tablet by mouth every 4 (four) hours as needed for pain.  Dispense: 90 tablet; Refill: 0 We discussed that he is to receive his Schedule II prescriptions only from [place]. He is also aware that his prescription history is available to [place] online through the [place]. Controlled substance agreement signed (Date). We reminded (Pt) that all patients receiving Schedule II narcotics must be seen for follow up every three months. We reviewed the terms of our pain agreement, including the need to keep medicines in a safe locked location away from children or pets, and the need to report excess sedation or constipation, measures to avoid constipation, and policies related to early refills and stolen prescriptions. According to the [place] Chronic [facility] program, we have reviewed details related to analgesia, adverse effects, aberrant behaviors. -  - Iron overload from chronic transfusion.  Pt has had only few transfusions  and is currently not on Exjade  - Vitamin D deficiency - Currently not on Vitamin D. Will check levels today.   The above recommendations are taken from the NIH Evidence-Based Management of Sickle Cell Disease: Expert Panel Report, 20149.     2. Immunization due - Flu Vaccine QUAD 36+ mos PF IM (Fluarix Quad PF)  - CBC with Differential - Reticulocytes - Urinalysis   Return in about 6 months (around 01/01/2015) for Hb SS without crisis, Annual Physical.  The patient was given clear instructions to go to ER or return to medical center if symptoms don't improve, worsen or new problems develop. The patient verbalized understanding. The patient was told to call to get lab  results if they haven't heard anything in the next week.     This note has been created with Surveyor, quantity. Any transcriptional errors are unintentional.    MATTHEWS,MICHELLE A., MD [place], [place]   07/04/2014, 2:46 PM

## 2014-07-31 ENCOUNTER — Telehealth: Payer: Self-pay | Admitting: Internal Medicine

## 2014-07-31 DIAGNOSIS — G894 Chronic pain syndrome: Secondary | ICD-10-CM

## 2014-07-31 DIAGNOSIS — D571 Sickle-cell disease without crisis: Secondary | ICD-10-CM

## 2014-07-31 MED ORDER — OXYCODONE-ACETAMINOPHEN 10-325 MG PO TABS: 1.0000 | ORAL_TABLET | ORAL | Status: DC | PRN

## 2014-07-31 NOTE — Telephone Encounter (Signed)
Refill request for percocet 10/325mg . LOV 07/02/2014. Please advise. Thanks!

## 2014-07-31 NOTE — Telephone Encounter (Signed)
Prescription re-written for Percocet 10-325 mg #90 tabs.

## 2014-08-28 ENCOUNTER — Telehealth: Payer: Self-pay | Admitting: Internal Medicine

## 2014-08-28 DIAGNOSIS — G894 Chronic pain syndrome: Secondary | ICD-10-CM

## 2014-08-28 DIAGNOSIS — D571 Sickle-cell disease without crisis: Secondary | ICD-10-CM

## 2014-08-28 MED ORDER — OXYCODONE-ACETAMINOPHEN 10-325 MG PO TABS: 1.0000 | ORAL_TABLET | ORAL | Status: DC | PRN

## 2014-08-28 NOTE — Telephone Encounter (Signed)
Refill request for percocet 10/325mg  LOV 07/03/2014. Please advise. Thanks!

## 2014-08-28 NOTE — Telephone Encounter (Signed)
Prescription re-written for Oxycodone 10-325 #90 tabs. NCCSRS reviewed and no inconsistencies noted.

## 2014-09-30 ENCOUNTER — Telehealth: Payer: Self-pay | Admitting: Internal Medicine

## 2014-09-30 DIAGNOSIS — G894 Chronic pain syndrome: Secondary | ICD-10-CM

## 2014-09-30 DIAGNOSIS — D571 Sickle-cell disease without crisis: Secondary | ICD-10-CM

## 2014-09-30 MED ORDER — OXYCODONE-ACETAMINOPHEN 10-325 MG PO TABS: 1.0000 | ORAL_TABLET | ORAL | Status: DC | PRN

## 2014-09-30 NOTE — Telephone Encounter (Signed)
Prescription written for Oxycodone-APAP 10-325 mg #90 tabs. NCCSRS reviewed and no inconsistencies noted.

## 2014-09-30 NOTE — Telephone Encounter (Signed)
Refill request for percocet 10/325mg . LOV 07/04/14. Please advise. Thanks!

## 2014-10-10 ENCOUNTER — Encounter (HOSPITAL_COMMUNITY): Payer: Self-pay | Admitting: Emergency Medicine

## 2014-10-10 ENCOUNTER — Inpatient Hospital Stay (HOSPITAL_COMMUNITY)
Admission: EM | Admit: 2014-10-10 | Discharge: 2014-10-18 | DRG: 811 | Disposition: A | Discharge status: Home / Self Care | Payer: Medicare Other | Attending: Internal Medicine | Admitting: Internal Medicine

## 2014-10-10 ENCOUNTER — Emergency Department (HOSPITAL_COMMUNITY): Payer: Medicare Other

## 2014-10-10 DIAGNOSIS — D571 Sickle-cell disease without crisis: Secondary | ICD-10-CM | POA: Diagnosis not present

## 2014-10-10 DIAGNOSIS — Z79899 Other long term (current) drug therapy: Secondary | ICD-10-CM

## 2014-10-10 DIAGNOSIS — R05 Cough: Secondary | ICD-10-CM

## 2014-10-10 DIAGNOSIS — D5700 Hb-SS disease with crisis, unspecified: Secondary | ICD-10-CM | POA: Diagnosis not present

## 2014-10-10 DIAGNOSIS — R059 Cough, unspecified: Secondary | ICD-10-CM

## 2014-10-10 DIAGNOSIS — D5701 Hb-SS disease with acute chest syndrome: Principal | ICD-10-CM | POA: Clinically undetermined

## 2014-10-10 DIAGNOSIS — M542 Cervicalgia: Secondary | ICD-10-CM | POA: Diagnosis not present

## 2014-10-10 DIAGNOSIS — E876 Hypokalemia: Secondary | ICD-10-CM | POA: Diagnosis not present

## 2014-10-10 DIAGNOSIS — Z9049 Acquired absence of other specified parts of digestive tract: Secondary | ICD-10-CM | POA: Diagnosis present

## 2014-10-10 DIAGNOSIS — R52 Pain, unspecified: Secondary | ICD-10-CM | POA: Diagnosis not present

## 2014-10-10 DIAGNOSIS — Z9114 Patient's other noncompliance with medication regimen: Secondary | ICD-10-CM | POA: Diagnosis present

## 2014-10-10 DIAGNOSIS — G894 Chronic pain syndrome: Secondary | ICD-10-CM | POA: Diagnosis present

## 2014-10-10 DIAGNOSIS — Z0389 Encounter for observation for other suspected diseases and conditions ruled out: Secondary | ICD-10-CM | POA: Diagnosis not present

## 2014-10-10 DIAGNOSIS — D638 Anemia in other chronic diseases classified elsewhere: Secondary | ICD-10-CM | POA: Diagnosis present

## 2014-10-10 DIAGNOSIS — J189 Pneumonia, unspecified organism: Secondary | ICD-10-CM

## 2014-10-10 DIAGNOSIS — M545 Low back pain: Secondary | ICD-10-CM | POA: Diagnosis not present

## 2014-10-10 DIAGNOSIS — J90 Pleural effusion, not elsewhere classified: Secondary | ICD-10-CM | POA: Diagnosis not present

## 2014-10-10 DIAGNOSIS — E871 Hypo-osmolality and hyponatremia: Secondary | ICD-10-CM | POA: Diagnosis present

## 2014-10-10 DIAGNOSIS — Z809 Family history of malignant neoplasm, unspecified: Secondary | ICD-10-CM | POA: Diagnosis not present

## 2014-10-10 LAB — CBC WITH DIFFERENTIAL/PLATELET
BASOS PCT: 0 % (ref 0–1)
Basophils Absolute: 0.1 10*3/uL (ref 0.0–0.1)
EOS PCT: 0 % (ref 0–5)
Eosinophils Absolute: 0 10*3/uL (ref 0.0–0.7)
HCT: 25.2 % — ABNORMAL LOW (ref 39.0–52.0)
Hemoglobin: 8.9 g/dL — ABNORMAL LOW (ref 13.0–17.0)
LYMPHS ABS: 2.8 10*3/uL (ref 0.7–4.0)
Lymphocytes Relative: 11 % — ABNORMAL LOW (ref 12–46)
MCH: 30.9 pg (ref 26.0–34.0)
MCHC: 35.3 g/dL (ref 30.0–36.0)
MCV: 87.5 fL (ref 78.0–100.0)
MONO ABS: 1.2 10*3/uL — AB (ref 0.1–1.0)
MONOS PCT: 5 % (ref 3–12)
NEUTROS PCT: 84 % — AB (ref 43–77)
Neutro Abs: 20.7 10*3/uL — ABNORMAL HIGH (ref 1.7–7.7)
Platelets: 678 10*3/uL — ABNORMAL HIGH (ref 150–400)
RBC: 2.88 MIL/uL — ABNORMAL LOW (ref 4.22–5.81)
RDW: 19.4 % — AB (ref 11.5–15.5)
WBC: 24.9 10*3/uL — AB (ref 4.0–10.5)

## 2014-10-10 LAB — BASIC METABOLIC PANEL
Anion gap: 10 (ref 5–15)
BUN: 10 mg/dL (ref 6–23)
CALCIUM: 10 mg/dL (ref 8.4–10.5)
CO2: 25 mmol/L (ref 19–32)
CREATININE: 0.72 mg/dL (ref 0.50–1.35)
Chloride: 106 mmol/L (ref 96–112)
GFR calc Af Amer: >90 mL/min (ref >90)
Glucose, Bld: 131 mg/dL — ABNORMAL HIGH (ref 70–99)
Potassium: 4.1 mmol/L (ref 3.5–5.1)
Sodium: 141 mmol/L (ref 135–145)

## 2014-10-10 LAB — URINALYSIS, ROUTINE W REFLEX MICROSCOPIC
Bilirubin Urine: NEGATIVE
GLUCOSE, UA: NEGATIVE mg/dL
Hgb urine dipstick: NEGATIVE
KETONES UR: NEGATIVE mg/dL
LEUKOCYTES UA: NEGATIVE
Nitrite: NEGATIVE
PH: 6.5 (ref 5.0–8.0)
Protein, ur: NEGATIVE mg/dL
Specific Gravity, Urine: 1.012 (ref 1.005–1.030)
Urobilinogen, UA: 1 mg/dL (ref 0.0–1.0)

## 2014-10-10 LAB — RETICULOCYTES
RBC.: 2.88 MIL/uL — ABNORMAL LOW (ref 4.22–5.81)
RETIC CT PCT: 12.2 % — AB (ref 0.4–3.1)
Retic Count, Absolute: 351.4 10*3/uL — ABNORMAL HIGH (ref 19.0–186.0)

## 2014-10-10 MED ORDER — SODIUM CHLORIDE 0.9 % IV SOLN: 12.5000 mg | Freq: Four times a day (QID) | INTRAVENOUS | Status: DC | PRN

## 2014-10-10 MED ORDER — ONDANSETRON HCL 4 MG PO TABS: 4.0000 mg | ORAL_TABLET | ORAL | Status: DC | PRN

## 2014-10-10 MED ORDER — NALOXONE HCL 0.4 MG/ML IJ SOLN: 0.4000 mg | INTRAMUSCULAR | Status: DC | PRN

## 2014-10-10 MED ORDER — HYDROMORPHONE HCL 1 MG/ML IJ SOLN
1.0000 mg | Freq: Once | INTRAMUSCULAR | Status: AC
  Administered 2014-10-10: 1 mg via INTRAVENOUS
  Filled 2014-10-10: qty 1

## 2014-10-10 MED ORDER — HYDROMORPHONE 2 MG/ML HIGH CONCENTRATION IV PCA SOLN
INTRAVENOUS | Status: DC
  Administered 2014-10-10: 22:00:00 via INTRAVENOUS
  Administered 2014-10-11: 6.5 mg via INTRAVENOUS
  Filled 2014-10-10: qty 25

## 2014-10-10 MED ORDER — PANTOPRAZOLE SODIUM 40 MG IV SOLR
40.0000 mg | Freq: Every day | INTRAVENOUS | Status: DC
  Filled 2014-10-10 (×4): qty 40

## 2014-10-10 MED ORDER — HYDROMORPHONE HCL 2 MG/ML IJ SOLN
2.0000 mg | Freq: Once | INTRAMUSCULAR | Status: AC
Start: 2014-10-10 — End: 2014-10-10
  Administered 2014-10-10: 2 mg via INTRAVENOUS
  Filled 2014-10-10: qty 1

## 2014-10-10 MED ORDER — ONDANSETRON HCL 4 MG/2ML IJ SOLN: 4.0000 mg | INTRAMUSCULAR | Status: DC | PRN

## 2014-10-10 MED ORDER — ONDANSETRON HCL 4 MG/2ML IJ SOLN: 4.0000 mg | Freq: Four times a day (QID) | INTRAMUSCULAR | Status: DC | PRN

## 2014-10-10 MED ORDER — SODIUM CHLORIDE 0.9 % IV BOLUS (SEPSIS)
1000.0000 mL | Freq: Once | INTRAVENOUS | Status: AC
  Administered 2014-10-10: 1000 mL via INTRAVENOUS

## 2014-10-10 MED ORDER — ALPRAZOLAM 0.25 MG PO TABS
0.2500 mg | ORAL_TABLET | Freq: Three times a day (TID) | ORAL | Status: DC
  Administered 2014-10-10 – 2014-10-12 (×6): 0.5 mg via ORAL
  Filled 2014-10-10 (×2): qty 2
  Filled 2014-10-10: qty 1
  Filled 2014-10-10 (×2): qty 2
  Filled 2014-10-10: qty 1
  Filled 2014-10-10: qty 2

## 2014-10-10 MED ORDER — SODIUM CHLORIDE 0.9 % IV SOLN
INTRAVENOUS | Status: DC
  Administered 2014-10-10 – 2014-10-11 (×2): via INTRAVENOUS

## 2014-10-10 MED ORDER — SODIUM CHLORIDE 0.9 % IJ SOLN: 9.0000 mL | INTRAMUSCULAR | Status: DC | PRN

## 2014-10-10 MED ORDER — PANTOPRAZOLE SODIUM 40 MG PO TBEC
40.0000 mg | DELAYED_RELEASE_TABLET | Freq: Every day | ORAL | Status: DC
  Administered 2014-10-11 – 2014-10-14 (×4): 40 mg via ORAL
  Filled 2014-10-10 (×5): qty 1

## 2014-10-10 MED ORDER — FOLIC ACID 1 MG PO TABS
1.0000 mg | ORAL_TABLET | Freq: Every day | ORAL | Status: DC
  Administered 2014-10-11 – 2014-10-18 (×8): 1 mg via ORAL
  Filled 2014-10-10 (×8): qty 1

## 2014-10-10 MED ORDER — ENOXAPARIN SODIUM 40 MG/0.4ML ~~LOC~~ SOLN
40.0000 mg | SUBCUTANEOUS | Status: DC
  Administered 2014-10-11 – 2014-10-14 (×3): 40 mg via SUBCUTANEOUS
  Filled 2014-10-10 (×8): qty 0.4

## 2014-10-10 MED ORDER — HYDROXYUREA 500 MG PO CAPS
1000.0000 mg | ORAL_CAPSULE | Freq: Two times a day (BID) | ORAL | Status: DC
Start: 2014-10-10 — End: 2014-10-18
  Administered 2014-10-10 – 2014-10-18 (×16): 1000 mg via ORAL
  Filled 2014-10-10 (×18): qty 2

## 2014-10-10 MED ORDER — KETOROLAC TROMETHAMINE 30 MG/ML IJ SOLN
30.0000 mg | Freq: Once | INTRAMUSCULAR | Status: AC
  Administered 2014-10-10: 30 mg via INTRAVENOUS
  Filled 2014-10-10: qty 1

## 2014-10-10 MED ORDER — HYDROMORPHONE HCL 1 MG/ML IJ SOLN
1.0000 mg | INTRAMUSCULAR | Status: AC | PRN
  Administered 2014-10-10 (×2): 1 mg via INTRAVENOUS
  Filled 2014-10-10 (×2): qty 1

## 2014-10-10 MED ORDER — DIPHENHYDRAMINE HCL 12.5 MG/5ML PO ELIX: 12.5000 mg | ORAL_SOLUTION | Freq: Four times a day (QID) | ORAL | Status: DC | PRN

## 2014-10-10 MED ORDER — OXYCODONE-ACETAMINOPHEN 5-325 MG PO TABS
1.0000 | ORAL_TABLET | ORAL | Status: DC | PRN
  Administered 2014-10-11 (×2): 1 via ORAL
  Filled 2014-10-10 (×3): qty 1

## 2014-10-10 MED ORDER — FOLIC ACID 400 MCG PO TABS: 400.0000 ug | ORAL_TABLET | Freq: Every day | ORAL | Status: DC

## 2014-10-10 MED ORDER — OXYCODONE-ACETAMINOPHEN 10-325 MG PO TABS: 1.0000 | ORAL_TABLET | ORAL | Status: DC | PRN

## 2014-10-10 MED ORDER — POLYETHYLENE GLYCOL 3350 17 G PO PACK: 17.0000 g | PACK | Freq: Every day | ORAL | Status: DC | PRN

## 2014-10-10 MED ORDER — OXYCODONE HCL 5 MG PO TABS
5.0000 mg | ORAL_TABLET | ORAL | Status: DC | PRN
  Administered 2014-10-10 – 2014-10-11 (×4): 5 mg via ORAL
  Filled 2014-10-10 (×5): qty 1

## 2014-10-10 MED ORDER — SENNOSIDES-DOCUSATE SODIUM 8.6-50 MG PO TABS
1.0000 | ORAL_TABLET | Freq: Two times a day (BID) | ORAL | Status: DC
  Administered 2014-10-13 – 2014-10-18 (×8): 1 via ORAL
  Filled 2014-10-10 (×16): qty 1

## 2014-10-10 NOTE — ED Provider Notes (Signed)
CSN: [id number]     Arrival date & time 10/10/14  1638 History   First MD Initiated Contact with Patient 10/10/14 1640     Chief Complaint  Patient presents with  . Sickle Cell Pain Crisis     (Consider location/radiation/quality/duration/timing/severity/associated sxs/prior Treatment) Patient is a 34 y.o. male presenting with sickle cell pain.  Sickle Cell Pain Crisis Location:  Back Severity:  Severe Onset quality:  Gradual Duration: several hours. Similar to previous crisis episodes: yes   Timing:  Constant Progression:  Worsening Chronicity:  Recurrent Relieved by:  Nothing Ineffective treatments: hydrocodone, hot bath. Associated symptoms: vomiting (vomited once after taking pain medication)   Associated symptoms: no chest pain, no fever and no shortness of breath     Past Medical History  Diagnosis Date  . Sickle cell anemia    Past Surgical History  Procedure Laterality Date  . Cholecystectomy     Family History  Problem Relation Age of Onset  . Sickle cell anemia Mother   . Sickle cell trait Father   . Sickle cell trait Sister   . Sickle cell trait Brother   . Cancer Maternal Grandmother    History  Substance Use Topics  . Smoking status: Never Smoker   . Smokeless tobacco: Not on file  . Alcohol Use: No    Review of Systems  Constitutional: Negative for fever.  Respiratory: Negative for shortness of breath.   Cardiovascular: Negative for chest pain.  Gastrointestinal: Positive for vomiting (vomited once after taking pain medication).  All other systems reviewed and are negative.     Allergies  Review of patient's allergies indicates no known allergies.  Home Medications   Prior to Admission medications   Medication Sig Start Date End Date Taking? Authorizing Provider  cholecalciferol (VITAMIN D) 1000 UNITS tablet Take 1,000 Units by mouth daily.    Historical Provider, MD  folic acid (FOLVITE) 115 MCG tablet Take 400 mcg by mouth daily.     Historical Provider, MD  hydroxyurea (HYDREA) 500 MG capsule Take 2 capsules (1,000 mg total) by mouth 2 (two) times daily. May take with food to minimize GI side effects. 02/26/14   Dorena Dew, FNP  ibuprofen (ADVIL,MOTRIN) 600 MG tablet Take 1 tablet (600 mg total) by mouth every 6 (six) hours as needed for mild pain or moderate pain. 03/21/14   Dorena Dew, FNP  oxyCODONE-acetaminophen (PERCOCET) 10-325 MG per tablet Take 1 tablet by mouth every 4 (four) hours as needed for pain. 09/30/14   Leana Gamer, MD   BP 155/107 mmHg  Pulse 87  Resp 26  SpO2 100% Physical Exam  Constitutional: He is oriented to person, place, and time. He appears well-developed and well-nourished. He appears distressed (appears in pain).  HENT:  Head: Normocephalic and atraumatic.  Mouth/Throat: Oropharynx is clear and moist.  Eyes: Conjunctivae are normal. Pupils are equal, round, and reactive to light. No scleral icterus.  Neck: Neck supple.  Cardiovascular: Normal rate, regular rhythm, normal heart sounds and intact distal pulses.   No murmur heard. Pulmonary/Chest: Effort normal and breath sounds normal. No stridor. No respiratory distress. He has no wheezes. He has no rales.  Abdominal: Soft. He exhibits no distension. There is no tenderness.  Musculoskeletal: Normal range of motion. He exhibits no edema.       Lumbar back: He exhibits tenderness.  Neurological: He is alert and oriented to person, place, and time. He has normal strength.  Skin: Skin is warm and  dry. No rash noted.  Psychiatric: He has a normal mood and affect. His behavior is normal.  Nursing note and vitals reviewed.   ED Course  Procedures (including critical care time) Labs Review Labs Reviewed  CBC WITH DIFFERENTIAL/PLATELET - Abnormal; Notable for the following:    WBC 24.9 (*)    RBC 2.88 (*)    Hemoglobin 8.9 (*)    HCT 25.2 (*)    RDW 19.4 (*)    Platelets 678 (*)    Neutrophils Relative % 84 (*)    Neutro  Abs 20.7 (*)    Lymphocytes Relative 11 (*)    Monocytes Absolute 1.2 (*)    All other components within normal limits  BASIC METABOLIC PANEL - Abnormal; Notable for the following:    Glucose, Bld 131 (*)    All other components within normal limits  RETICULOCYTES - Abnormal; Notable for the following:    Retic Ct Pct 12.2 (*)    RBC. 2.88 (*)    Retic Count, Manual 351.4 (*)    All other components within normal limits  URINALYSIS, ROUTINE W REFLEX MICROSCOPIC    Imaging Review Dg Chest 2 View  10/10/2014   CLINICAL DATA:  Sickle cell pain crisis.  EXAM: CHEST  2 VIEW  COMPARISON:  11/09/2010  FINDINGS: Heart is borderline enlarged. Lungs are clear. No effusions. No acute bony abnormality. No edema.  IMPRESSION: Borderline cardiomegaly.  No acute findings.   Electronically Signed   By: Rolm Baptise M.D.   On: 10/10/2014 18:09  All radiology studies independently viewed by me.      EKG Interpretation   Date/Time:  Friday October 10 2014 17:59:45 EST Ventricular Rate:  83 PR Interval:  178 QRS Duration: 106 QT Interval:  398 QTC Calculation: 468 R Axis:   76 Text Interpretation:  Sinus rhythm RSR' in V1 or V2, probably normal  variant Probable left ventricular hypertrophy Nonspecific T abnormalities,  diffuse leads Baseline wander in lead(s) I III aVL compared to prior from  16-Mar-2010, now has nonspecific t wave changes.  Confirmed by [facility]   MD, Lytle Michaels ([id number]) on 10/10/2014 6:08:01 PM      MDM   Final diagnoses:  Sickle cell pain crisis    34 yo male with generally well controlled sickle cell disease who presented with severe pain which he felt was secondary to a pain crisis.  After multiple rounds of IV dilaudid, as well as fluids and toradol, he continued to have severe pain.    Have consulted internal medicine for admission.  Houston Siren III, MD 10/10/14 [phone]

## 2014-10-10 NOTE — ED Notes (Signed)
Pt attempted to get urine sample, unable at this time

## 2014-10-10 NOTE — ED Notes (Signed)
Pt reminded of need for urine sample. Pt states he still cannot urinate. Pt given water.

## 2014-10-10 NOTE — ED Notes (Signed)
Bed: WA17 Expected date:  Expected time:  Means of arrival:  Comments: [facility] 

## 2014-10-10 NOTE — ED Notes (Signed)
Per EMS-sickle cell pain that started two hours ago, home meds not working-C/O back pain

## 2014-10-10 NOTE — H&P (Signed)
PCP:   MATTHEWS,MICHELLE A., MD    Chief Complaint:  Back pain  HPI: [name] is a 34 y.o. male   has a past medical history of Sickle cell anemia.   Presented with  Today he started to have severe pain crisis with back pain, some nausea some rib pain but deneis any chest pain. No cough or fever he tries to take percocet at home with no result. No cough. Hospitalist was called for admission for sickle cell crisis.   Review of Systems:    Pertinent positives include:  Nausea, back pain  Constitutional:  No weight loss, night sweats, Fevers, chills, fatigue, weight loss  HEENT:  No headaches, Difficulty swallowing,Tooth/dental problems,Sore throat,  No sneezing, itching, ear ache, nasal congestion, post nasal drip,  Cardio-vascular:  No chest pain, Orthopnea, PND, anasarca, dizziness, palpitations.no Bilateral lower extremity swelling  GI:  No heartburn, indigestion, abdominal pain, nausea, vomiting, diarrhea, change in bowel habits, loss of appetite, melena, blood in stool, hematemesis Resp:  no shortness of breath at rest. No dyspnea on exertion, No excess mucus, no productive cough, No non-productive cough, No coughing up of blood.No change in color of mucus.No wheezing. Skin:  no rash or lesions. No jaundice GU:  no dysuria, change in color of urine, no urgency or frequency. No straining to urinate.  No flank pain.  Musculoskeletal:  No joint pain or no joint swelling. No decreased range of motion.   Psych:  No change in mood or affect. No depression or anxiety. No memory loss.  Neuro: no localizing neurological complaints, no tingling, no weakness, no double vision, no gait abnormality, no slurred speech, no confusion  Otherwise ROS are negative except for above, 10 systems were reviewed  Past Medical History: Past Medical History  Diagnosis Date  . Sickle cell anemia    Past Surgical History  Procedure Laterality Date  . Cholecystectomy        Medications: Prior to Admission medications   Medication Sig Start Date End Date Taking? Authorizing Provider  cholecalciferol (VITAMIN D) 1000 UNITS tablet Take 1,000 Units by mouth daily.   Yes Historical Provider, MD  folic acid (FOLVITE) 034 MCG tablet Take 400 mcg by mouth daily.   Yes Historical Provider, MD  hydroxyurea (HYDREA) 500 MG capsule Take 2 capsules (1,000 mg total) by mouth 2 (two) times daily. May take with food to minimize GI side effects. 02/26/14  Yes Dorena Dew, FNP  oxyCODONE-acetaminophen (PERCOCET) 10-325 MG per tablet Take 1 tablet by mouth every 4 (four) hours as needed for pain. 09/30/14  Yes Leana Gamer, MD  ibuprofen (ADVIL,MOTRIN) 600 MG tablet Take 1 tablet (600 mg total) by mouth every 6 (six) hours as needed for mild pain or moderate pain. Patient not taking: Reported on 10/10/2014 03/21/14   Dorena Dew, FNP    Allergies:  No Known Allergies  Social History:  Ambulatory   independently   Lives at home with girlfriend      reports that he has never smoked. He does not have any smokeless tobacco history on file. He reports that he does not drink alcohol or use illicit drugs.    Family History: family history includes Cancer in his maternal grandmother; Sickle cell anemia in his mother; Sickle cell trait in his brother, father, and sister.    Physical Exam: Patient Vitals for the past 24 hrs:  BP Pulse Resp SpO2  10/10/14 1853 110/56 mmHg 93 20 100 %  10/10/14 1654 (!)  155/107 mmHg 87 26 100 %    1. General:  in No Acute distress 2. Psychological: Alert and   Oriented 3. Head/ENT:    Dry Mucous Membranes                          Head Non traumatic, neck supple                          Normal   Dentition 4. SKIN:   decreased Skin turgor,  Skin clean Dry and intact no rash 5. Heart: Regular rate and rhythm no Murmur, Rub or gallop 6. Lungs: Clear to auscultation bilaterally, no wheezes or crackles   7. Abdomen: Soft,  non-tender, Non distended 8. Lower extremities: no clubbing, cyanosis, or edema 9. Neurologically Grossly intact, moving all 4 extremities equally 10. MSK: Normal range of motion  body mass index is unknown because there is no weight on file.   Labs on Admission:   Results for orders placed or performed during the hospital encounter of 10/10/14 (from the past 24 hour(s))  CBC WITH DIFFERENTIAL     Status: Abnormal   Collection Time: 10/10/14  4:51 PM  Result Value Ref Range   WBC 24.9 (H) 4.0 - 10.5 K/uL   RBC 2.88 (L) 4.22 - 5.81 MIL/uL   Hemoglobin 8.9 (L) 13.0 - 17.0 g/dL   HCT 25.2 (L) 39.0 - 52.0 %   MCV 87.5 78.0 - 100.0 fL   MCH 30.9 26.0 - 34.0 pg   MCHC 35.3 30.0 - 36.0 g/dL   RDW 19.4 (H) 11.5 - 15.5 %   Platelets 678 (H) 150 - 400 K/uL   Neutrophils Relative % 84 (H) 43 - 77 %   Neutro Abs 20.7 (H) 1.7 - 7.7 K/uL   Lymphocytes Relative 11 (L) 12 - 46 %   Lymphs Abs 2.8 0.7 - 4.0 K/uL   Monocytes Relative 5 3 - 12 %   Monocytes Absolute 1.2 (H) 0.1 - 1.0 K/uL   Eosinophils Relative 0 0 - 5 %   Eosinophils Absolute 0.0 0.0 - 0.7 K/uL   Basophils Relative 0 0 - 1 %   Basophils Absolute 0.1 0.0 - 0.1 K/uL  Basic metabolic panel     Status: Abnormal   Collection Time: 10/10/14  4:51 PM  Result Value Ref Range   Sodium 141 135 - 145 mmol/L   Potassium 4.1 3.5 - 5.1 mmol/L   Chloride 106 96 - 112 mmol/L   CO2 25 19 - 32 mmol/L   Glucose, Bld 131 (H) 70 - 99 mg/dL   BUN 10 6 - 23 mg/dL   Creatinine, Ser 0.72 0.50 - 1.35 mg/dL   Calcium 10.0 8.4 - 10.5 mg/dL   GFR calc non Af Amer >90 >90 mL/min   GFR calc Af Amer >90 >90 mL/min   Anion gap 10 5 - 15  Reticulocytes     Status: Abnormal   Collection Time: 10/10/14  4:51 PM  Result Value Ref Range   Retic Ct Pct 12.2 (H) 0.4 - 3.1 %   RBC. 2.88 (L) 4.22 - 5.81 MIL/uL   Retic Count, Manual 351.4 (H) 19.0 - 186.0 K/uL    UA pending  No results found for: HGBA1C  CrCl cannot be calculated (Unknown ideal  weight.).  BNP (last 3 results) No results for input(s): PROBNP in the last 8760 hours.  Other results:  I  have pearsonaly reviewed this: ECG REPORT  Rate: 83  Rhythm: SR ST&T Change: no ischemia   There were no vitals filed for this visit.   Cultures:    Component Value Date/Time   SDES BLOOD RIGHT ARM 11/09/2010 0805   SPECREQUEST NONE BOTTLES DRAWN AEROBIC AND ANAEROBIC 10CC EACH 11/09/2010 0805   CULT NO GROWTH 5 DAYS 11/09/2010 0805   REPTSTATUS 11/15/2010 FINAL 11/09/2010 0805     Radiological Exams on Admission: Dg Chest 2 View  10/10/2014   CLINICAL DATA:  Sickle cell pain crisis.  EXAM: CHEST  2 VIEW  COMPARISON:  11/09/2010  FINDINGS: Heart is borderline enlarged. Lungs are clear. No effusions. No acute bony abnormality. No edema.  IMPRESSION: Borderline cardiomegaly.  No acute findings.   Electronically Signed   By: Rolm Baptise M.D.   On: 10/10/2014 18:09    Chart has been reviewed  Assessment/Plan  34 year old gentleman history of sickle cell disease with chronic pain presents with sickle cell pain crisis which is evident by his discomfort elevated critique count up to 12.2.   Present on Admission:  . Sickle cell anemia with crisis - [place] states current pain is typical for his crisis will admit per sickle cell protocol, control pain, hydrate, provide oxygen.    Transfuse as needed if Hg drops significantly below baseline. If develops fever or respiratory symptoms would evaluate for acute chest and initiate antibiotics as needed. . Chronic pain syndrome - continue Percocet when necessary at the time of discharge write for high-dose PCA  Leukocytosis in a setting of sickle cell crisis. Chest x-ray showing no evidence of pneumonia. UA pending. Patient denies any fevers will continue to monitor likely stress related   Prophylaxis:  Lovenox, Protonix  CODE STATUS:  FULL CODE    Other plan as per orders.  I have spent a total of 55 min on this  admission  Cortlynn Hollinsworth 10/10/2014, 7:35 PM  [facility]  Pager [phone]   after 2 AM please page floor coverage PA If 7AM-7PM, please contact the day team taking care of the patient  [email or web address]  Password [email or web address]

## 2014-10-10 NOTE — ED Notes (Signed)
Pt in pain and states he is unable to lay still or on his back at this time, unable to obtain EKG at this time, RN aware

## 2014-10-11 DIAGNOSIS — D5700 Hb-SS disease with crisis, unspecified: Secondary | ICD-10-CM

## 2014-10-11 LAB — CBC WITH DIFFERENTIAL/PLATELET
BASOS ABS: 0 10*3/uL (ref 0.0–0.1)
Basophils Relative: 0 % (ref 0–1)
EOS PCT: 0 % (ref 0–5)
Eosinophils Absolute: 0 10*3/uL (ref 0.0–0.7)
HCT: 21.6 % — ABNORMAL LOW (ref 39.0–52.0)
Hemoglobin: 7.6 g/dL — ABNORMAL LOW (ref 13.0–17.0)
LYMPHS ABS: 1.3 10*3/uL (ref 0.7–4.0)
LYMPHS PCT: 8 % — AB (ref 12–46)
MCH: 30.3 pg (ref 26.0–34.0)
MCHC: 35.2 g/dL (ref 30.0–36.0)
MCV: 86.1 fL (ref 78.0–100.0)
MONOS PCT: 7 % (ref 3–12)
Monocytes Absolute: 1.1 10*3/uL — ABNORMAL HIGH (ref 0.1–1.0)
NEUTROS PCT: 85 % — AB (ref 43–77)
Neutro Abs: 13.3 10*3/uL — ABNORMAL HIGH (ref 1.7–7.7)
Platelets: 595 10*3/uL — ABNORMAL HIGH (ref 150–400)
RBC: 2.51 MIL/uL — AB (ref 4.22–5.81)
RDW: 18.6 % — ABNORMAL HIGH (ref 11.5–15.5)
WBC: 15.7 10*3/uL — ABNORMAL HIGH (ref 4.0–10.5)
nRBC: 9 /100 WBC — ABNORMAL HIGH

## 2014-10-11 LAB — RETICULOCYTES
RBC.: 2.51 MIL/uL — ABNORMAL LOW (ref 4.22–5.81)
Retic Count, Absolute: 261 10*3/uL — ABNORMAL HIGH (ref 19.0–186.0)
Retic Ct Pct: 10.4 % — ABNORMAL HIGH (ref 0.4–3.1)

## 2014-10-11 LAB — LACTATE DEHYDROGENASE: LDH: 1120 U/L — ABNORMAL HIGH (ref 94–250)

## 2014-10-11 MED ORDER — HYDROMORPHONE 2 MG/ML HIGH CONCENTRATION IV PCA SOLN
INTRAVENOUS | Status: DC
  Administered 2014-10-11: 7 mg via INTRAVENOUS
  Administered 2014-10-11: 2.5 mg via INTRAVENOUS
  Filled 2014-10-11: qty 25

## 2014-10-11 MED ORDER — DEXTROSE-NACL 5-0.45 % IV SOLN
INTRAVENOUS | Status: DC
  Administered 2014-10-11: 13:00:00 via INTRAVENOUS
  Administered 2014-10-12: 1000 mL via INTRAVENOUS
  Administered 2014-10-12 – 2014-10-17 (×7): via INTRAVENOUS

## 2014-10-11 MED ORDER — HYDROMORPHONE 2 MG/ML HIGH CONCENTRATION IV PCA SOLN
INTRAVENOUS | Status: DC
  Administered 2014-10-11: 12 mg via INTRAVENOUS
  Administered 2014-10-11: 6 mg via INTRAVENOUS
  Administered 2014-10-12: 3 mg via INTRAVENOUS
  Administered 2014-10-12: 6.63 mg via INTRAVENOUS
  Administered 2014-10-12: 05:00:00 via INTRAVENOUS
  Administered 2014-10-12: 3 mg via INTRAVENOUS
  Filled 2014-10-11: qty 25

## 2014-10-11 MED ORDER — HYDROMORPHONE 2 MG/ML HIGH CONCENTRATION IV PCA SOLN: INTRAVENOUS | Status: DC

## 2014-10-11 NOTE — Progress Notes (Signed)
Patient arrived on the unit at approximately 2101 last night. Patient was moaning due to intense  pain. Dilaudid PCA initiated as ordered and prn Oxy given with fair relief. Will continue to monitor for increased pain.

## 2014-10-11 NOTE — Progress Notes (Signed)
SICKLE CELL SERVICE PROGRESS NOTE  [name] DUK:[id number] DOB: 05-13-1981 DOA: 10/10/2014 PCP: MATTHEWS,MICHELLE A., MD   Presenting BJS:[id number] [name] is a 34 y.o. male   has a past medical history of Sickle cell anemia.   Presented with  Today he started to have severe pain crisis with back pain, some nausea some rib pain but deneis any chest pain. No cough or fever he tries to take percocet at home with no result. No cough. Hospitalist was called for admission for sickle cell crisis.    Consultants:  none  Procedures:  none  Antibiotics:  none  HPI/Subjective: Pt states that his pain is primarily in his back and ribs. Pain started yesterday morning. He has no cough or SOB. He is eating and drinking well. He doesn't feel that PCA is helping his pain.  Objective: Filed Vitals:   10/11/14 0325 10/11/14 0453 10/11/14 0800 10/11/14 1056  BP:  122/72  119/68  Pulse:  105  95  Temp:  98.1 F (36.7 C)  98.4 F (36.9 C)  TempSrc:  Oral  Oral  Resp: 12 13 18 16   Height:      Weight:      SpO2: 97% 99% 99% 100%   Weight change:   Intake/Output Summary (Last 24 hours) at 10/11/14 1132 Last data filed at 10/11/14 1054  Gross per 24 hour  Intake 1469.1 ml  Output    800 ml  Net  669.1 ml    General: Alert, awake, oriented x3, in moderate discomfort.  HEENT: [place]/AT PEERL, EOMI Neck: Trachea midline,  no masses, no thyromegaly no JVD, no carotid bruit OROPHARYNX:  Moist, No exudate/ erythema/lesions.  Heart: Regular rate and rhythm, without murmurs, rubs, gallops. Lungs: Clear to auscultation, no wheezing or rhonchi noted.  Abdomen: Soft, nontender, nondistended, positive bowel sounds, no masses no hepatosplenomegaly noted. Neuro: No focal neurological deficits noted cranial nerves II through XII grossly intact. Strength 5 out of 5 in bilateral upper and lower extremities. Musculoskeletal: No warm swelling or erythema around joints, no spinal tenderness  noted. Extremities: no c/c/e  Data Reviewed: Basic Metabolic Panel:  Recent Labs Lab 10/10/14 1651  NA 141  K 4.1  CL 106  CO2 25  GLUCOSE 131*  BUN 10  CREATININE 0.72  CALCIUM 10.0   Liver Function Tests: No results for input(s): AST, ALT, ALKPHOS, BILITOT, PROT, ALBUMIN in the last 168 hours. No results for input(s): LIPASE, AMYLASE in the last 168 hours. No results for input(s): AMMONIA in the last 168 hours. CBC:  Recent Labs Lab 10/10/14 1651 10/11/14 0450  WBC 24.9* 15.7*  NEUTROABS 20.7* 13.3*  HGB 8.9* 7.6*  HCT 25.2* 21.6*  MCV 87.5 86.1  PLT 678* 595*   Cardiac Enzymes: No results for input(s): CKTOTAL, CKMB, CKMBINDEX, TROPONINI in the last 168 hours. BNP (last 3 results) No results for input(s): BNP in the last 8760 hours.  ProBNP (last 3 results) No results for input(s): PROBNP in the last 8760 hours.  CBG: No results for input(s): GLUCAP in the last 168 hours.  No results found for this or any previous visit (from the past 240 hour(s)).   Studies: Dg Chest 2 View  10/10/2014   CLINICAL DATA:  Sickle cell pain crisis.  EXAM: CHEST  2 VIEW  COMPARISON:  11/09/2010  FINDINGS: Heart is borderline enlarged. Lungs are clear. No effusions. No acute bony abnormality. No edema.  IMPRESSION: Borderline cardiomegaly.  No acute findings.   Electronically Signed  By: Rolm Baptise M.D.   On: 10/10/2014 18:09    Scheduled Meds: . ALPRAZolam  0.25-0.5 mg Oral TID  . enoxaparin (LOVENOX) injection  40 mg Subcutaneous Q24H  . folic acid  1 mg Oral Daily  . HYDROmorphone PCA 2 mg/mL   Intravenous 6 times per day  . hydroxyurea  1,000 mg Oral BID  . pantoprazole  40 mg Oral Daily   Or  . pantoprazole (PROTONIX) IV  40 mg Intravenous Daily  . senna-docusate  1 tablet Oral BID   Continuous Infusions: . sodium chloride 125 mL/hr at 10/11/14 0603    Active Problems:   Chronic pain syndrome   Sickle cell anemia with crisis   Sickle cell  crisis   Assessment/Plan: Active Problems:   Chronic pain syndrome   Sickle cell anemia with crisis   Sickle cell crisis  1)Sickle Cell Crisis: Pt presented with pain consistent with VOC. Pt's pain is an 8/10. Will continue Dilaudid PCA but change lockout period to q9min. Continue Toradol.  2) Anemia: Hgb down to 7.6 from 8.9, likely due to hemolysis. Will obtain LDH and continue to monitor. Transfuse if Hgb less than 5.5 or symptomatic.  3)Sickle Cell Care: Continue Folic acid and hydroxyurea.  4) FEN/GI :  Regular Diet   IV fluids- change to D5/0.45% Bowel regimen in place   Code Status: Full  DVT Prophylaxis: enoxaparin  Family Communication: none  Disposition Plan: none  Time spent:25 min  [name]  Pager [phone]. If 7PM-7AM, please contact night-coverage.  10/11/2014, 11:32 AM  LOS: 1 day   [name]

## 2014-10-11 NOTE — Discharge Instructions (Signed)
Sickle Cell Anemia, Adult °Sickle cell anemia is a condition in which red blood cells have an abnormal "sickle" shape. This abnormal shape shortens the cells' life span, which results in a lower than normal concentration of red blood cells in the blood. The sickle shape also causes the cells to clump together and block free blood flow through the blood vessels. As a result, the tissues and organs of the body do not receive enough oxygen. Sickle cell anemia causes organ damage and pain and increases the risk of infection. °CAUSES  °Sickle cell anemia is a genetic disorder. Those who receive two copies of the gene have the condition, and those who receive one copy have the trait. °RISK FACTORS °The sickle cell gene is most common in people whose families originated in [place]. Other areas of the globe where sickle cell trait occurs include the Mediterranean, [place] and [place], the [place], and the [place].  °SIGNS AND SYMPTOMS °· Pain, especially in the extremities, back, chest, or abdomen (common). The pain may start suddenly or may develop following an illness, especially if there is dehydration. Pain can also occur due to overexertion or exposure to extreme temperature changes. °· Frequent severe bacterial infections, especially certain types of pneumonia and meningitis. °· Pain and swelling in the hands and feet. °· Decreased activity.   °· Loss of appetite.   °· Change in behavior. °· Headaches. °· Seizures. °· Shortness of breath or difficulty breathing. °· Vision changes. °· Skin ulcers. °Those with the trait may not have symptoms or they may have mild symptoms.  °DIAGNOSIS  °Sickle cell anemia is diagnosed with blood tests that demonstrate the genetic trait. It is often diagnosed during the newborn period, due to mandatory testing nationwide. A variety of blood tests, X-rays, CT scans, MRI scans, ultrasounds, and lung function tests may also be done to monitor the condition. °TREATMENT  °Sickle  cell anemia may be treated with: °· Medicines. You may be given pain medicines, antibiotic medicines (to treat and prevent infections) or medicines to increase the production of certain types of hemoglobin. °· Fluids. °· Oxygen. °· Blood transfusions. °HOME CARE INSTRUCTIONS  °· Drink enough fluid to keep your urine clear or pale yellow. Increase your fluid intake in hot weather and during exercise. °· Do not smoke. Smoking lowers oxygen levels in the blood.   °· Only take over-the-counter or prescription medicines for pain, fever, or discomfort as directed by your health care provider. °· Take antibiotics as directed by your health care provider. Make sure you finish them it even if you start to feel better.   °· Take supplements as directed by your health care provider.   °· Consider wearing a medical alert bracelet. This tells anyone caring for you in an emergency of your condition.   °· When traveling, keep your medical information, health care provider's names, and the medicines you take with you at all times.   °· If you develop a fever, do not take medicines to reduce the fever right away. This could cover up a problem that is developing. Notify your health care provider. °· Keep all follow-up appointments with your health care provider. Sickle cell anemia requires regular medical care. °SEEK MEDICAL CARE IF: ° You have a fever. °SEEK IMMEDIATE MEDICAL CARE IF:  °· You feel dizzy or faint.   °· You have new abdominal pain, especially on the left side near the stomach area.   °· You develop a persistent, often uncomfortable and painful penile erection (priapism). If this is not treated immediately it   will lead to impotence.   You have numbness your arms or legs or you have a hard time moving them.   You have a hard time with speech.   You have a fever or persistent symptoms for more than 2-3 days.   You have a fever and your symptoms suddenly get worse.   You have signs or symptoms of infection.  These include:   Chills.   Abnormal tiredness (lethargy).   Irritability.   Poor eating.   Vomiting.   You develop pain that is not helped with medicine.   You develop shortness of breath.  You have pain in your chest.   You are coughing up pus-like or bloody sputum.   You develop a stiff neck.  Your feet or hands swell or have pain.  Your abdomen appears bloated.  You develop joint pain. MAKE SURE YOU:  Understand these instructions. Document Released: 11/09/2005 Document Revised: 12/16/2013 Document Reviewed: 03/13/2013 [facility] Patient Information 2015 [place], [place]. This information is not intended to replace advice given to you by your health care provider. Make sure you discuss any questions you have with your health care provider.  Anemia, Nonspecific Anemia is a condition in which the concentration of red blood cells or hemoglobin in the blood is below normal. Hemoglobin is a substance in red blood cells that carries oxygen to the tissues of the body. Anemia results in not enough oxygen reaching these tissues.  CAUSES  Common causes of anemia include:   Excessive bleeding. Bleeding may be internal or external. This includes excessive bleeding from periods (in women) or from the intestine.   Poor nutrition.   Chronic kidney, thyroid, and liver disease.  Bone marrow disorders that decrease red blood cell production.  Cancer and treatments for cancer.  HIV, AIDS, and their treatments.  Spleen problems that increase red blood cell destruction.  Blood disorders.  Excess destruction of red blood cells due to infection, medicines, and autoimmune disorders. SIGNS AND SYMPTOMS   Minor weakness.   Dizziness.   Headache.  Palpitations.   Shortness of breath, especially with exercise.   Paleness.  Cold sensitivity.  Indigestion.  Nausea.  Difficulty sleeping.  Difficulty concentrating. Symptoms may occur suddenly or they may  develop slowly.  DIAGNOSIS  Additional blood tests are often needed. These help your health care provider determine the best treatment. Your health care provider will check your stool for blood and look for other causes of blood loss.  TREATMENT  Treatment varies depending on the cause of the anemia. Treatment can include:   Supplements of iron, vitamin J09, or folic acid.   Hormone medicines.   A blood transfusion. This may be needed if blood loss is severe.   Hospitalization. This may be needed if there is significant continual blood loss.   Dietary changes.  Spleen removal. HOME CARE INSTRUCTIONS Keep all follow-up appointments. It often takes many weeks to correct anemia, and having your health care provider check on your condition and your response to treatment is very important. SEEK IMMEDIATE MEDICAL CARE IF:   You develop extreme weakness, shortness of breath, or chest pain.   You become dizzy or have trouble concentrating.  You develop heavy vaginal bleeding.   You develop a rash.   You have bloody or black, tarry stools.   You faint.   You vomit up blood.   You vomit repeatedly.   You have abdominal pain.  You have a fever or persistent symptoms for more than 2-3  days.   You have a fever and your symptoms suddenly get worse.   You are dehydrated.  MAKE SURE YOU:  Understand these instructions.  Will watch your condition.  Will get help right away if you are not doing well or get worse. Document Released: 09/08/2004 Document Revised: 04/03/2013 Document Reviewed: 01/25/2013 [facility] Patient Information 2015 [place], [place]. This information is not intended to replace advice given to you by your health care provider. Make sure you discuss any questions you have with your health care provider.

## 2014-10-11 NOTE — Progress Notes (Signed)
Dilaudid PCA dose changed due to patient's continued complaints of poor pain relief. Will continue to monitor for any increase or decrease in pain.

## 2014-10-12 ENCOUNTER — Inpatient Hospital Stay (HOSPITAL_COMMUNITY): Payer: Medicare Other

## 2014-10-12 DIAGNOSIS — J189 Pneumonia, unspecified organism: Secondary | ICD-10-CM

## 2014-10-12 LAB — CBC WITH DIFFERENTIAL/PLATELET
BASOS PCT: 0 % (ref 0–1)
Basophils Absolute: 0 10*3/uL (ref 0.0–0.1)
EOS ABS: 0 10*3/uL (ref 0.0–0.7)
Eosinophils Relative: 0 % (ref 0–5)
HCT: 22.7 % — ABNORMAL LOW (ref 39.0–52.0)
Hemoglobin: 8 g/dL — ABNORMAL LOW (ref 13.0–17.0)
LYMPHS PCT: 12 % (ref 12–46)
Lymphs Abs: 2 10*3/uL (ref 0.7–4.0)
MCH: 30.4 pg (ref 26.0–34.0)
MCHC: 35.2 g/dL (ref 30.0–36.0)
MCV: 86.3 fL (ref 78.0–100.0)
MONO ABS: 0.8 10*3/uL (ref 0.1–1.0)
Monocytes Relative: 5 % (ref 3–12)
Neutro Abs: 13.7 10*3/uL — ABNORMAL HIGH (ref 1.7–7.7)
Neutrophils Relative %: 83 % — ABNORMAL HIGH (ref 43–77)
Platelets: 426 10*3/uL — ABNORMAL HIGH (ref 150–400)
RBC: 2.63 MIL/uL — AB (ref 4.22–5.81)
RDW: 17.8 % — AB (ref 11.5–15.5)
WBC: 16.5 10*3/uL — AB (ref 4.0–10.5)

## 2014-10-12 LAB — BASIC METABOLIC PANEL
ANION GAP: 4 — AB (ref 5–15)
BUN: 12 mg/dL (ref 6–23)
CALCIUM: 8.8 mg/dL (ref 8.4–10.5)
CHLORIDE: 106 mmol/L (ref 96–112)
CO2: 25 mmol/L (ref 19–32)
CREATININE: 0.97 mg/dL (ref 0.50–1.35)
GFR calc Af Amer: >90 mL/min (ref >90)
GLUCOSE: 125 mg/dL — AB (ref 70–99)
POTASSIUM: 3.7 mmol/L (ref 3.5–5.1)
Sodium: 135 mmol/L (ref 135–145)

## 2014-10-12 LAB — RETICULOCYTES
RBC.: 2.63 MIL/uL — AB (ref 4.22–5.81)
Retic Count, Absolute: 189.4 10*3/uL — ABNORMAL HIGH (ref 19.0–186.0)
Retic Ct Pct: 7.2 % — ABNORMAL HIGH (ref 0.4–3.1)

## 2014-10-12 LAB — LACTATE DEHYDROGENASE: LDH: 1540 U/L — ABNORMAL HIGH (ref 94–250)

## 2014-10-12 MED ORDER — HYDROMORPHONE 2 MG/ML HIGH CONCENTRATION IV PCA SOLN
INTRAVENOUS | Status: DC
  Administered 2014-10-12: 7 mg via INTRAVENOUS
  Administered 2014-10-12 – 2014-10-13 (×4): 2.8 mg via INTRAVENOUS
  Administered 2014-10-13: 0.7 mg via INTRAVENOUS
  Administered 2014-10-13: 2.8 mg via INTRAVENOUS
  Administered 2014-10-13: 3.5 mg via INTRAVENOUS
  Administered 2014-10-13: 2.8 mg via INTRAVENOUS
  Administered 2014-10-14: 2 mg via INTRAVENOUS
  Administered 2014-10-14 (×2): 2.1 mg via INTRAVENOUS
  Administered 2014-10-14: 4.2 mg via INTRAVENOUS
  Administered 2014-10-14: 2.1 mg via INTRAVENOUS
  Administered 2014-10-14 (×2): 2.8 mg via INTRAVENOUS
  Administered 2014-10-15: 5.6 mg via INTRAVENOUS
  Administered 2014-10-15 (×2): 2.8 mg via INTRAVENOUS
  Administered 2014-10-15: 7.7 mg via INTRAVENOUS
  Administered 2014-10-15: 5.6 mg via INTRAVENOUS
  Administered 2014-10-16: 7.7 mg via INTRAVENOUS
  Administered 2014-10-16: 6.3 mg via INTRAVENOUS
  Administered 2014-10-16: 7.7 mL via INTRAVENOUS
  Administered 2014-10-16: 5.6 mg via INTRAVENOUS
  Administered 2014-10-16: 08:00:00 via INTRAVENOUS
  Administered 2014-10-16: 5.6 mg via INTRAVENOUS
  Administered 2014-10-16: 7.7 mg via INTRAVENOUS
  Administered 2014-10-17: 2.6 mL via INTRAVENOUS
  Administered 2014-10-17: 2.1 mL via INTRAVENOUS
  Administered 2014-10-17: 2.8 mg via INTRAVENOUS
  Administered 2014-10-17 (×2): 0.7 mg via INTRAVENOUS
  Administered 2014-10-17: 2.1 mg via INTRAVENOUS
  Administered 2014-10-18: 1.4 mg via INTRAVENOUS
  Administered 2014-10-18: 2.1 mg via INTRAVENOUS
  Administered 2014-10-18: 0.7 mg via INTRAVENOUS
  Filled 2014-10-12 (×3): qty 25

## 2014-10-12 MED ORDER — ACETAMINOPHEN 325 MG PO TABS
650.0000 mg | ORAL_TABLET | Freq: Four times a day (QID) | ORAL | Status: DC | PRN
  Administered 2014-10-12 – 2014-10-14 (×4): 650 mg via ORAL
  Filled 2014-10-12 (×4): qty 2

## 2014-10-12 MED ORDER — LEVOFLOXACIN 750 MG PO TABS
750.0000 mg | ORAL_TABLET | Freq: Every day | ORAL | Status: DC
  Administered 2014-10-12 – 2014-10-17 (×6): 750 mg via ORAL
  Filled 2014-10-12 (×7): qty 1

## 2014-10-12 NOTE — Progress Notes (Signed)
SICKLE CELL SERVICE PROGRESS NOTE  [name] [id number] DOB: May 16, 1981 DOA: 10/10/2014 PCP: [name] A., MD   Presenting GGY:IRSWNI [name] is a 34 y.o. male   has a past medical history of Sickle cell anemia.   Presented with  Today he started to have severe pain crisis with back pain, some nausea some rib pain but deneis any chest pain. No cough or fever he tries to take percocet at home with no result. No cough. Hospitalist was called for admission for sickle cell crisis.    Consultants:  none  Procedures:  none  Antibiotics:  none  HPI/Subjective: Pt states that his pain is the same as yesterday. He reports cough and sputum production. He is eating and drinking well. The  PCA is helping his pain but he often gets too sleepy. He spiked a fever last night so sputum and blood cultures were collected.  Objective: Filed Vitals:   10/12/14 1253 10/12/14 1355 10/12/14 1639 10/12/14 1700  BP:  117/62  121/66  Pulse:  94  88  Temp:  98.1 F (36.7 C)  98.1 F (36.7 C)  TempSrc:  Oral  Oral  Resp: 13 20 14 15   Height:      Weight:      SpO2: 99% 98% 95% 98%   Weight change: 15.5 oz (0.439 kg)  Intake/Output Summary (Last 24 hours) at 10/12/14 1804 Last data filed at 10/12/14 1440  Gross per 24 hour  Intake 3986.34 ml  Output   2350 ml  Net 1636.34 ml    General: Alert, awake, oriented x3, in moderate discomfort.  HEENT: [place]/AT PEERL, EOMI Neck: Trachea midline,  no masses, no thyromegaly no JVD, no carotid bruit OROPHARYNX:  Moist, No exudate/ erythema/lesions.  Heart: Regular rate and rhythm, without murmurs, rubs, gallops. Lungs: Clear to auscultation, no wheezing or rhonchi noted.  Abdomen: Soft, nontender, nondistended, positive bowel sounds, no masses no hepatosplenomegaly noted. Neuro: No focal neurological deficits noted cranial nerves II through XII grossly intact. Strength 5 out of 5 in bilateral upper and lower  extremities. Musculoskeletal: No warm swelling or erythema around joints, no spinal tenderness noted. Extremities: no c/c/e  Data Reviewed: Basic Metabolic Panel:  Recent Labs Lab 10/10/14 1651 10/12/14 0100  NA 141 135  K 4.1 3.7  CL 106 106  CO2 25 25  GLUCOSE 131* 125*  BUN 10 12  CREATININE 0.72 0.97  CALCIUM 10.0 8.8   Liver Function Tests: No results for input(s): AST, ALT, ALKPHOS, BILITOT, PROT, ALBUMIN in the last 168 hours. No results for input(s): LIPASE, AMYLASE in the last 168 hours. No results for input(s): AMMONIA in the last 168 hours. CBC:  Recent Labs Lab 10/10/14 1651 10/11/14 0450 10/12/14 0100  WBC 24.9* 15.7* 16.5*  NEUTROABS 20.7* 13.3* 13.7*  HGB 8.9* 7.6* 8.0*  HCT 25.2* 21.6* 22.7*  MCV 87.5 86.1 86.3  PLT 678* 595* 426*   Cardiac Enzymes: No results for input(s): CKTOTAL, CKMB, CKMBINDEX, TROPONINI in the last 168 hours. BNP (last 3 results) No results for input(s): BNP in the last 8760 hours.  ProBNP (last 3 results) No results for input(s): PROBNP in the last 8760 hours.  CBG: No results for input(s): GLUCAP in the last 168 hours.  No results found for this or any previous visit (from the past 240 hour(s)).   Studies: Dg Chest 2 View  10/12/2014   CLINICAL DATA:  Sickle cell disease.  Cough for 1 day.  EXAM: CHEST  2 VIEW  COMPARISON:  Radiograph 10/10/2014  FINDINGS: Cardiac silhouette is enlarged. New left retrocardiac opacity. Small bilateral pleural effusions. Central venous congestion is similar prior.  IMPRESSION: 1. New left lower lobe pneumonia. 2. Small bilateral effusions.   Electronically Signed   By: [name] M.D.   On: 10/12/2014 14:09   Dg Chest 2 View  10/10/2014   CLINICAL DATA:  Sickle cell pain crisis.  EXAM: CHEST  2 VIEW  COMPARISON:  11/09/2010  FINDINGS: Heart is borderline enlarged. Lungs are clear. No effusions. No acute bony abnormality. No edema.  IMPRESSION: Borderline cardiomegaly.  No acute  findings.   Electronically Signed   By: [name] M.D.   On: 10/10/2014 18:09    Scheduled Meds: . enoxaparin (LOVENOX) injection  40 mg Subcutaneous Q24H  . folic acid  1 mg Oral Daily  . HYDROmorphone PCA 2 mg/mL   Intravenous 6 times per day  . hydroxyurea  1,000 mg Oral BID  . levofloxacin  750 mg Oral q1800  . pantoprazole  40 mg Oral Daily   Or  . pantoprazole (PROTONIX) IV  40 mg Intravenous Daily  . senna-docusate  1 tablet Oral BID   Continuous Infusions: . dextrose 5 % and 0.45% NaCl 100 mL/hr at 10/12/14 0940    Principal Problem:   Sickle cell anemia with crisis Active Problems:   Chronic pain syndrome   Sickle cell crisis   CAP (community acquired pneumonia)   Assessment/Plan: Principal Problem:   Sickle cell anemia with crisis Active Problems:   Chronic pain syndrome   Sickle cell crisis   CAP (community acquired pneumonia)  1)Sickle Cell Crisis: Pt presented with pain consistent with VOC. Pt's pain is an 8/10 today. Will continue Dilaudid PCA  But decrease to 0.7mg  q10 min. Continue Toradol.  2)Pneumonia/CAP-new cough, fever and CXR today with LLL pneumonia-Start Levaquin 750mg  PO for 7 days, day 1/7. 2) Anemia: Hgb now 8.0 from 8.9. LDH- 1540, consistent with hemolysis. Will continue to monitor. Transfuse if Hgb less than 5.5 or symptomatic.  3)Sickle Cell Care: Continue Folic acid and hydroxyurea.  4) FEN/GI :  Regular Diet   IV fluids- change to D5/0.45% Bowel regimen in place   Code Status: Full  DVT Prophylaxis: enoxaparin  Family Communication: none  Disposition Plan: none  Time spent:35 min  [name]  Pager [phone]. If 7PM-7AM, please contact night-coverage.  10/12/2014, 6:04 PM  LOS: 2 days   [name]

## 2014-10-13 LAB — LACTATE DEHYDROGENASE: LDH: 1126 U/L — ABNORMAL HIGH (ref 94–250)

## 2014-10-13 LAB — BASIC METABOLIC PANEL
ANION GAP: 5 (ref 5–15)
BUN: 6 mg/dL (ref 6–23)
CO2: 28 mmol/L (ref 19–32)
Calcium: 8.5 mg/dL (ref 8.4–10.5)
Chloride: 103 mmol/L (ref 96–112)
Creatinine, Ser: 0.72 mg/dL (ref 0.50–1.35)
Glucose, Bld: 117 mg/dL — ABNORMAL HIGH (ref 70–99)
Potassium: 3.4 mmol/L — ABNORMAL LOW (ref 3.5–5.1)
Sodium: 136 mmol/L (ref 135–145)

## 2014-10-13 LAB — RETICULOCYTES
RBC.: 2.26 MIL/uL — AB (ref 4.22–5.81)
RETIC CT PCT: 8.2 % — AB (ref 0.4–3.1)
Retic Count, Absolute: 185.3 10*3/uL (ref 19.0–186.0)

## 2014-10-13 LAB — URINE CULTURE
Colony Count: NO GROWTH
Culture: NO GROWTH
Special Requests: NORMAL

## 2014-10-13 LAB — CBC WITH DIFFERENTIAL/PLATELET
Basophils Absolute: 0 10*3/uL (ref 0.0–0.1)
Basophils Relative: 0 % (ref 0–1)
EOS ABS: 0 10*3/uL (ref 0.0–0.7)
Eosinophils Relative: 0 % (ref 0–5)
HEMATOCRIT: 19.4 % — AB (ref 39.0–52.0)
Hemoglobin: 6.9 g/dL — CL (ref 13.0–17.0)
LYMPHS PCT: 16 % (ref 12–46)
Lymphs Abs: 2 10*3/uL (ref 0.7–4.0)
MCH: 30.5 pg (ref 26.0–34.0)
MCHC: 35.6 g/dL (ref 30.0–36.0)
MCV: 85.8 fL (ref 78.0–100.0)
Monocytes Absolute: 0.5 10*3/uL (ref 0.1–1.0)
Monocytes Relative: 4 % (ref 3–12)
NEUTROS ABS: 9.8 10*3/uL — AB (ref 1.7–7.7)
NRBC: 11 /100{WBCs} — AB
Neutrophils Relative %: 80 % — ABNORMAL HIGH (ref 43–77)
PLATELETS: 358 10*3/uL (ref 150–400)
RBC: 2.26 MIL/uL — ABNORMAL LOW (ref 4.22–5.81)
RDW: 17.9 % — AB (ref 11.5–15.5)
WBC: 12.3 10*3/uL — AB (ref 4.0–10.5)

## 2014-10-13 NOTE — Progress Notes (Signed)
CRITICAL VALUE ALERT  Critical value received:HGB 6.9  Date of notification:  10/13/14  Time of notification: 0510  Critical value read back:yes  Nurse who received alert: Azzie Glatter, RN  MD notified (1st page):  [facility] on call Time of first page:  [phone]  MD notified (2nd page): n/a  Time of second page:n/a  Responding MD:  [facility] on call provider; Received call back.  Time MD responded:  0515am

## 2014-10-13 NOTE — Progress Notes (Signed)
SICKLE CELL SERVICE PROGRESS NOTE  [name] [id number] DOB: [birth year] DOA: 10/10/2014 PCP: [name] A., MD   Presenting IOX:BDZHGD [name] is a 34 y.o. male   has a past medical history of Sickle cell anemia.   Presented with  Today he started to have severe pain crisis with back pain, some nausea some rib pain but deneis any chest pain. No cough or fever he tries to take percocet at home with no result. No cough. Hospitalist was called for admission for sickle cell crisis.    Consultants:  none  Procedures:  none  Antibiotics:  none  HPI/Subjective: Pt states that his pain is still the same. Rating it at a 6-7/10. He spiked a fever last night and was given tylenol. He is getting up and going to the restroom.  Objective: Filed Vitals:   10/13/14 0437 10/13/14 0800 10/13/14 1038 10/13/14 1133  BP: 127/63  112/57   Pulse: 94  91   Temp: 99.8 F (37.7 C)  99.6 F (37.6 C)   TempSrc: Oral  Oral   Resp: 18 20 20 18   Height:      Weight: 141 lb 1.6 oz (64.003 kg)     SpO2: 100% 100% 100% 98%   Weight change: -4.8 oz (-0.136 kg)  Intake/Output Summary (Last 24 hours) at 10/13/14 1201 Last data filed at 10/13/14 0939  Gross per 24 hour  Intake 3619.67 ml  Output   2900 ml  Net 719.67 ml    General: Alert, awake, oriented x3, in NAD.  HEENT: [place]/AT PEERL, EOMI Neck: Trachea midline,  no masses, no thyromegaly no JVD, no carotid bruit OROPHARYNX:  Moist, No exudate/ erythema/lesions.  Heart: Regular rate and rhythm, without murmurs, rubs, gallops. Lungs: Clear to auscultation, no wheezing or rhonchi noted.  Abdomen: Soft, nontender, nondistended, positive bowel sounds, no masses no hepatosplenomegaly noted. Neuro: No focal neurological deficits noted cranial nerves II through XII grossly intact.  Musculoskeletal: No warm swelling or erythema around joints, no spinal tenderness noted. Extremities: no c/c/e  Data Reviewed: Basic Metabolic  Panel:  Recent Labs Lab 10/10/14 1651 10/12/14 0100 10/13/14 0447  NA 141 135 136  K 4.1 3.7 3.4*  CL 106 106 103  CO2 25 25 28   GLUCOSE 131* 125* 117*  BUN 10 12 6   CREATININE 0.72 0.97 0.72  CALCIUM 10.0 8.8 8.5   Liver Function Tests: No results for input(s): AST, ALT, ALKPHOS, BILITOT, PROT, ALBUMIN in the last 168 hours. No results for input(s): LIPASE, AMYLASE in the last 168 hours. No results for input(s): AMMONIA in the last 168 hours. CBC:  Recent Labs Lab 10/10/14 1651 10/11/14 0450 10/12/14 0100 10/13/14 0447  WBC 24.9* 15.7* 16.5* 12.3*  NEUTROABS 20.7* 13.3* 13.7* 9.8*  HGB 8.9* 7.6* 8.0* 6.9*  HCT 25.2* 21.6* 22.7* 19.4*  MCV 87.5 86.1 86.3 85.8  PLT 678* 595* 426* 358   Cardiac Enzymes: No results for input(s): CKTOTAL, CKMB, CKMBINDEX, TROPONINI in the last 168 hours. BNP (last 3 results) No results for input(s): BNP in the last 8760 hours.  ProBNP (last 3 results) No results for input(s): PROBNP in the last 8760 hours.  CBG: No results for input(s): GLUCAP in the last 168 hours.  Recent Results (from the past 240 hour(s))  Culture, blood (routine x 2)     Status: None (Preliminary result)   Collection Time: 10/12/14  1:00 AM  Result Value Ref Range Status   Specimen Description BLOOD LEFT ARM  Final  Special Requests BOTTLES DRAWN AEROBIC AND ANAEROBIC 8CC  Final   Culture   Final           BLOOD CULTURE RECEIVED NO GROWTH TO DATE CULTURE WILL BE HELD FOR 5 DAYS BEFORE ISSUING A FINAL NEGATIVE REPORT Performed at [facility]    Report Status PENDING  Incomplete  Culture, blood (routine x 2)     Status: None (Preliminary result)   Collection Time: 10/12/14  1:05 AM  Result Value Ref Range Status   Specimen Description BLOOD LEFT HAND  Final   Special Requests BOTTLES DRAWN AEROBIC ONLY 10CC  Final   Culture   Final           BLOOD CULTURE RECEIVED NO GROWTH TO DATE CULTURE WILL BE HELD FOR 5 DAYS BEFORE ISSUING A FINAL NEGATIVE  REPORT Performed at [facility]    Report Status PENDING  Incomplete     Studies: Dg Chest 2 View  10/12/2014   CLINICAL DATA:  Sickle cell disease.  Cough for 1 day.  EXAM: CHEST  2 VIEW  COMPARISON:  Radiograph 10/10/2014  FINDINGS: Cardiac silhouette is enlarged. New left retrocardiac opacity. Small bilateral pleural effusions. Central venous congestion is similar prior.  IMPRESSION: 1. New left lower lobe pneumonia. 2. Small bilateral effusions.   Electronically Signed   By: [name] Bouchard M.D.   On: 10/12/2014 14:09   Dg Chest 2 View  10/10/2014   CLINICAL DATA:  Sickle cell pain crisis.  EXAM: CHEST  2 VIEW  COMPARISON:  11/09/2010  FINDINGS: Heart is borderline enlarged. Lungs are clear. No effusions. No acute bony abnormality. No edema.  IMPRESSION: Borderline cardiomegaly.  No acute findings.   Electronically Signed   By: [name] Baptise M.D.   On: 10/10/2014 18:09    Scheduled Meds: . enoxaparin (LOVENOX) injection  40 mg Subcutaneous Q24H  . folic acid  1 mg Oral Daily  . HYDROmorphone PCA 2 mg/mL   Intravenous 6 times per day  . hydroxyurea  1,000 mg Oral BID  . levofloxacin  750 mg Oral q1800  . pantoprazole  40 mg Oral Daily   Or  . pantoprazole (PROTONIX) IV  40 mg Intravenous Daily  . senna-docusate  1 tablet Oral BID   Continuous Infusions: . dextrose 5 % and 0.45% NaCl 100 mL/hr at 10/13/14 6295    Principal Problem:   Sickle cell anemia with crisis Active Problems:   Chronic pain syndrome   Sickle cell crisis   CAP (community acquired pneumonia)   Assessment/Plan: Principal Problem:   Sickle cell anemia with crisis Active Problems:   Chronic pain syndrome   Sickle cell crisis   CAP (community acquired pneumonia)  1)Sickle Cell Crisis: Pt presented with pain consistent with VOC. Pt's pain is 7/10 today. Will continue Dilaudid PCA  0.7mg  q10 min. Continue Toradol.  2)Pneumonia/CAP-CXR with LLL pneumonia-Continue Levaquin 750mg  PO for 7 days, day  2/7. When he first spiked a fever, blood culture from 2/28 is negative to date. Urine culture is pending. Sputum culture was ordered but not obtained.  2) Anemia: Hgb now 6.9 from 8.0. LDH- decreasing from 1540-->1126, consistent with hemolysis but likely resolving. Will continue to monitor. Transfuse if Hgb less than 5.5 or symptomatic.  3)Sickle Cell Care: Continue Folic acid and hydroxyurea.  4)Leukocytosis: Improving, now 12.3. Reflective of crisis and pneumonia. Monitor  5) FEN/GI : Regular Diet   IV fluids- continue D5/0.45% Bowel regimen in place   Code Status:  Full  DVT Prophylaxis: enoxaparin  Family Communication: none  Disposition Plan: none  Time spent:25 min  [name]  Pager [phone]. If 7PM-7AM, please contact night-coverage.  10/13/2014, 12:01 PM  LOS: 3 days   [name]

## 2014-10-14 DIAGNOSIS — E876 Hypokalemia: Secondary | ICD-10-CM | POA: Diagnosis not present

## 2014-10-14 DIAGNOSIS — D5701 Hb-SS disease with acute chest syndrome: Principal | ICD-10-CM

## 2014-10-14 DIAGNOSIS — D638 Anemia in other chronic diseases classified elsewhere: Secondary | ICD-10-CM | POA: Diagnosis present

## 2014-10-14 DIAGNOSIS — J189 Pneumonia, unspecified organism: Secondary | ICD-10-CM

## 2014-10-14 LAB — BASIC METABOLIC PANEL
ANION GAP: 9 (ref 5–15)
BUN: <5 mg/dL — ABNORMAL LOW (ref 6–23)
CHLORIDE: 96 mmol/L (ref 96–112)
CO2: 30 mmol/L (ref 19–32)
Calcium: 8.6 mg/dL (ref 8.4–10.5)
Creatinine, Ser: 0.73 mg/dL (ref 0.50–1.35)
GFR calc Af Amer: >90 mL/min (ref >90)
GLUCOSE: 120 mg/dL — AB (ref 70–99)
Potassium: 3.2 mmol/L — ABNORMAL LOW (ref 3.5–5.1)
SODIUM: 135 mmol/L (ref 135–145)

## 2014-10-14 LAB — CBC WITH DIFFERENTIAL/PLATELET
Basophils Absolute: 0 10*3/uL (ref 0.0–0.1)
Basophils Relative: 0 % (ref 0–1)
EOS ABS: 0.1 10*3/uL (ref 0.0–0.7)
Eosinophils Relative: 1 % (ref 0–5)
HCT: 18 % — ABNORMAL LOW (ref 39.0–52.0)
Hemoglobin: 6.5 g/dL — CL (ref 13.0–17.0)
LYMPHS PCT: 25 % (ref 12–46)
Lymphs Abs: 2.6 10*3/uL (ref 0.7–4.0)
MCH: 31 pg (ref 26.0–34.0)
MCHC: 36.1 g/dL — ABNORMAL HIGH (ref 30.0–36.0)
MCV: 85.7 fL (ref 78.0–100.0)
MONOS PCT: 6 % (ref 3–12)
Monocytes Absolute: 0.6 10*3/uL (ref 0.1–1.0)
Neutro Abs: 7.1 10*3/uL (ref 1.7–7.7)
Neutrophils Relative %: 68 % (ref 43–77)
PLATELETS: 345 10*3/uL (ref 150–400)
RBC: 2.1 MIL/uL — ABNORMAL LOW (ref 4.22–5.81)
RDW: 17.1 % — AB (ref 11.5–15.5)
WBC: 10.4 10*3/uL (ref 4.0–10.5)
nRBC: 13 /100 WBC — ABNORMAL HIGH

## 2014-10-14 LAB — RETICULOCYTES
RBC.: 2.1 MIL/uL — ABNORMAL LOW (ref 4.22–5.81)
Retic Count, Absolute: 134.4 10*3/uL (ref 19.0–186.0)
Retic Ct Pct: 6.4 % — ABNORMAL HIGH (ref 0.4–3.1)

## 2014-10-14 LAB — MAGNESIUM: Magnesium: 1.7 mg/dL (ref 1.5–2.5)

## 2014-10-14 LAB — PREPARE RBC (CROSSMATCH)

## 2014-10-14 LAB — LACTATE DEHYDROGENASE: LDH: 936 U/L — ABNORMAL HIGH (ref 94–250)

## 2014-10-14 MED ORDER — SODIUM CHLORIDE 0.9 % IV SOLN
25.0000 mg | INTRAVENOUS | Status: DC | PRN
  Filled 2014-10-14: qty 0.5

## 2014-10-14 MED ORDER — DIPHENHYDRAMINE HCL 25 MG PO CAPS: 25.0000 mg | ORAL_CAPSULE | Freq: Four times a day (QID) | ORAL | Status: DC | PRN

## 2014-10-14 MED ORDER — POTASSIUM CHLORIDE CRYS ER 20 MEQ PO TBCR
40.0000 meq | EXTENDED_RELEASE_TABLET | ORAL | Status: AC
  Administered 2014-10-14 (×2): 40 meq via ORAL
  Filled 2014-10-14 (×2): qty 2

## 2014-10-14 MED ORDER — SODIUM CHLORIDE 0.9 % IV SOLN
Freq: Once | INTRAVENOUS | Status: AC
  Administered 2014-10-14: 12:00:00 via INTRAVENOUS

## 2014-10-14 NOTE — Progress Notes (Signed)
SICKLE CELL SERVICE PROGRESS NOTE  [name] HYW:[id number] DOB: 1981/05/31 DOA: 10/10/2014 PCP: Terryl Molinelli A., MD  Assessment/Plan: Principal Problem:   Sickle cell anemia with crisis Active Problems:   Chronic pain syndrome   Sickle cell crisis   CAP (community acquired pneumonia)   Acute chest syndrome   Hypokalemia   Anemia of chronic disease   1. Acute Chest Syndrome: Pt meets criteria for Acute Chest Syndrome likely secondary to CAP. I will continue antibiotics and also transfuse 1 unit of blood to aid in decreasing % of Hb S to facilitate decrease in occlusion and hemolysis and to increase Oxygen carrying capacity and delivery to tissue. 2. Hb SS with crisis: Pt continues to have pain 8/10 localized to back and legs. I will continue PCA at current dose and  add Toradol. Re-assess tomorrow. 3. CAP: Pt on Levaquin Day #3/8 of Levaquin 4. Anemia of chronic disease: Pt at about 2 grams lower than baseline. However in light of cute Chest Syndrome, I will transfuse 1 unit of blood. 5. Hypokalemia: replaced orally. Check Mg.    Code Status: Full Code Family Communication: N/A Disposition Plan: Not yet ready for discharge  [place].  Pager [phone]. If 7PM-7AM, please contact night-coverage.  10/14/2014, 10:28 AM  LOS: 4 days    Consultants:  None  Procedures:  None  Antibiotics:  Levaquin 2/28 >>  HPI/Subjective: Pt continues to have pain at 7-8/10 in back and legs. He appears in significant distress secondary to pain. He reports that he had a BM yesterday.  Objective: Filed Vitals:   10/14/14 0053 10/14/14 0351 10/14/14 0441 10/14/14 0720  BP: 120/59  141/78   Pulse: 79  85   Temp: 98.8 F (37.1 C)  99.4 F (37.4 C)   TempSrc: Oral  Oral   Resp: 20 17 17 25   Height:      Weight:      SpO2: 100%  100% 98%   Weight change: 7 lb 11.4 oz (3.498 kg)  Intake/Output Summary (Last 24 hours) at 10/14/14 1028 Last data filed at 10/14/14  0700  Gross per 24 hour  Intake   2052 ml  Output   2350 ml  Net   -298 ml    General: Alert, awake, oriented x3, in moderate acute distress.  HEENT: [place]/AT PEERL, EOMI Neck: Trachea midline,  no masses, no thyromegal,y no JVD. OROPHARYNX:  Moist, No exudate/ erythema/lesions.  Heart: Regular rate and rhythm, without murmurs, rubs, gallops.  Lungs: Clear to auscultation, no wheezing or rhonchi noted. No increased vocal fremitus resonant to percussion  Abdomen: Soft, nontender, nondistended, positive bowel sounds, no masses no hepatosplenomegaly noted.  Neuro: No focal neurological deficits noted cranial nerves II through XII grossly intact.Strength at baseline in bilateral upper and lower extremities. Musculoskeletal: No warm swelling or erythema around joints, no spinal tenderness noted. Psychiatric: Patient alert and oriented x3, good insight and cognition, good recent to remote recall. Lymph node survey: No cervical axillary or inguinal lymphadenopathy noted.   Data Reviewed: Basic Metabolic Panel:  Recent Labs Lab 10/10/14 1651 10/12/14 0100 10/13/14 0447 10/14/14 0436 10/14/14 0509  NA 141 135 136 135  --   K 4.1 3.7 3.4* 3.2*  --   CL 106 106 103 96  --   CO2 25 25 28 30   --   GLUCOSE 131* 125* 117* 120*  --   BUN 10 12 6  <5*  --   CREATININE 0.72 0.97 0.72 0.73  --   CALCIUM  10.0 8.8 8.5 8.6  --   MG  --   --   --   --  1.7   Liver Function Tests: No results for input(s): AST, ALT, ALKPHOS, BILITOT, PROT, ALBUMIN in the last 168 hours. No results for input(s): LIPASE, AMYLASE in the last 168 hours. No results for input(s): AMMONIA in the last 168 hours. CBC:  Recent Labs Lab 10/10/14 1651 10/11/14 0450 10/12/14 0100 10/13/14 0447 10/14/14 0436  WBC 24.9* 15.7* 16.5* 12.3* 10.4  NEUTROABS 20.7* 13.3* 13.7* 9.8* 7.1  HGB 8.9* 7.6* 8.0* 6.9* 6.5*  HCT 25.2* 21.6* 22.7* 19.4* 18.0*  MCV 87.5 86.1 86.3 85.8 85.7  PLT 678* 595* 426* 358 345   Cardiac  Enzymes: No results for input(s): CKTOTAL, CKMB, CKMBINDEX, TROPONINI in the last 168 hours. BNP (last 3 results) No results for input(s): BNP in the last 8760 hours.  ProBNP (last 3 results) No results for input(s): PROBNP in the last 8760 hours.  CBG: No results for input(s): GLUCAP in the last 168 hours.  Recent Results (from the past 240 hour(s))  Culture, blood (routine x 2)     Status: None (Preliminary result)   Collection Time: 10/12/14  1:00 AM  Result Value Ref Range Status   Specimen Description BLOOD LEFT ARM  Final   Special Requests BOTTLES DRAWN AEROBIC AND ANAEROBIC 8CC  Final   Culture   Final           BLOOD CULTURE RECEIVED NO GROWTH TO DATE CULTURE WILL BE HELD FOR 5 DAYS BEFORE ISSUING A FINAL NEGATIVE REPORT Performed at [facility]    Report Status PENDING  Incomplete  Culture, blood (routine x 2)     Status: None (Preliminary result)   Collection Time: 10/12/14  1:05 AM  Result Value Ref Range Status   Specimen Description BLOOD LEFT HAND  Final   Special Requests BOTTLES DRAWN AEROBIC ONLY 10CC  Final   Culture   Final           BLOOD CULTURE RECEIVED NO GROWTH TO DATE CULTURE WILL BE HELD FOR 5 DAYS BEFORE ISSUING A FINAL NEGATIVE REPORT Performed at [facility]    Report Status PENDING  Incomplete  Culture, Urine     Status: None   Collection Time: 10/12/14  1:15 AM  Result Value Ref Range Status   Specimen Description URINE, CLEAN CATCH  Final   Special Requests Normal  Final   Colony Count NO GROWTH Performed at [facility]   Final   Culture NO GROWTH Performed at [facility]   Final   Report Status 10/13/2014 FINAL  Final     Studies: Dg Chest 2 View  10/12/2014   CLINICAL DATA:  Sickle cell disease.  Cough for 1 day.  EXAM: CHEST  2 VIEW  COMPARISON:  Radiograph 10/10/2014  FINDINGS: Cardiac silhouette is enlarged. New left retrocardiac opacity. Small bilateral pleural effusions. Central venous  congestion is similar prior.  IMPRESSION: 1. New left lower lobe pneumonia. 2. Small bilateral effusions.   Electronically Signed   By: Suzy Bouchard M.D.   On: 10/12/2014 14:09   Dg Chest 2 View  10/10/2014   CLINICAL DATA:  Sickle cell pain crisis.  EXAM: CHEST  2 VIEW  COMPARISON:  11/09/2010  FINDINGS: Heart is borderline enlarged. Lungs are clear. No effusions. No acute bony abnormality. No edema.  IMPRESSION: Borderline cardiomegaly.  No acute findings.   Electronically Signed   By: Lennette Bihari  [place] M.D.   On: 10/10/2014 18:09    Scheduled Meds: . sodium chloride   Intravenous Once  . enoxaparin (LOVENOX) injection  40 mg Subcutaneous Q24H  . folic acid  1 mg Oral Daily  . HYDROmorphone PCA 2 mg/mL   Intravenous 6 times per day  . hydroxyurea  1,000 mg Oral BID  . levofloxacin  750 mg Oral q1800  . pantoprazole  40 mg Oral Daily   Or  . pantoprazole (PROTONIX) IV  40 mg Intravenous Daily  . potassium chloride  40 mEq Oral Q2H  . senna-docusate  1 tablet Oral BID   Continuous Infusions: . dextrose 5 % and 0.45% NaCl 100 mL/hr at 10/14/14 0352   Total time spent 40 minutes

## 2014-10-14 NOTE — Care Management (Signed)
CARE MANAGEMENT NOTE 10/14/2014  Patient:  [name]   Account Number:  [id number]  Date Initiated:  10/14/2014  Documentation initiated by:  Marney Doctor  Subjective/Objective Assessment:   34 yo admitted with [facility]     Action/Plan:   From home with significant other   Anticipated DC Date:  10/17/2014   Anticipated DC Plan:  [place]  CM consult      Choice offered to / List presented to:             Status of service:  In process, will continue to follow Medicare Important Message given?   (If response is "NO", the following Medicare IM given date fields will be blank) Date Medicare IM given:   Medicare IM given by:   Date Additional Medicare IM given:   Additional Medicare IM given by:    Discharge Disposition:    Per UR Regulation:  Reviewed for med. necessity/level of care/duration of stay  If discussed at [place] of Stay Meetings, dates discussed:    Comments:  10/14/14 Marney Doctor RN,BSN,NCM [phone] Chart reviewed and CM following for DC needs.

## 2014-10-14 NOTE — Progress Notes (Signed)
Noted rise in temperature post transfusion to 101.2 F from 98.80F prior to start of transfusion. MD notified, feels that given no other symptoms of transfusion reaction and underlying LLL pneumonia along with previous spikes in temperatures this is not a transfusion reaction. RN to administer tylenol and recheck temperature as appropriate.

## 2014-10-15 DIAGNOSIS — E876 Hypokalemia: Secondary | ICD-10-CM

## 2014-10-15 LAB — BASIC METABOLIC PANEL
Anion gap: 7 (ref 5–15)
BUN: 6 mg/dL (ref 6–23)
CALCIUM: 8.8 mg/dL (ref 8.4–10.5)
CO2: 30 mmol/L (ref 19–32)
Chloride: 100 mmol/L (ref 96–112)
Creatinine, Ser: 0.62 mg/dL (ref 0.50–1.35)
GLUCOSE: 111 mg/dL — AB (ref 70–99)
Potassium: 3.2 mmol/L — ABNORMAL LOW (ref 3.5–5.1)
SODIUM: 137 mmol/L (ref 135–145)

## 2014-10-15 LAB — CBC WITH DIFFERENTIAL/PLATELET
BASOS PCT: 1 % (ref 0–1)
Basophils Absolute: 0.1 10*3/uL (ref 0.0–0.1)
EOS ABS: 0.1 10*3/uL (ref 0.0–0.7)
Eosinophils Relative: 1 % (ref 0–5)
HEMATOCRIT: 20.8 % — AB (ref 39.0–52.0)
Hemoglobin: 7.5 g/dL — ABNORMAL LOW (ref 13.0–17.0)
Lymphocytes Relative: 22 % (ref 12–46)
Lymphs Abs: 1.9 10*3/uL (ref 0.7–4.0)
MCH: 31.1 pg (ref 26.0–34.0)
MCHC: 36.1 g/dL — ABNORMAL HIGH (ref 30.0–36.0)
MCV: 86.3 fL (ref 78.0–100.0)
MONOS PCT: 7 % (ref 3–12)
Monocytes Absolute: 0.6 10*3/uL (ref 0.1–1.0)
NEUTROS ABS: 5.8 10*3/uL (ref 1.7–7.7)
Neutrophils Relative %: 69 % (ref 43–77)
Platelets: 317 10*3/uL (ref 150–400)
RBC: 2.41 MIL/uL — ABNORMAL LOW (ref 4.22–5.81)
RDW: 16.6 % — AB (ref 11.5–15.5)
WBC: 8.5 10*3/uL (ref 4.0–10.5)
nRBC: 19 /100 WBC — ABNORMAL HIGH

## 2014-10-15 LAB — MAGNESIUM: MAGNESIUM: 1.8 mg/dL (ref 1.5–2.5)

## 2014-10-15 LAB — TYPE AND SCREEN
ABO/RH(D): O POS
Antibody Screen: NEGATIVE
Unit division: 0

## 2014-10-15 LAB — LACTATE DEHYDROGENASE: LDH: 741 U/L — ABNORMAL HIGH (ref 94–250)

## 2014-10-15 MED ORDER — POTASSIUM CHLORIDE CRYS ER 20 MEQ PO TBCR
40.0000 meq | EXTENDED_RELEASE_TABLET | ORAL | Status: AC
Start: 2014-10-15 — End: 2014-10-15
  Administered 2014-10-15 (×3): 40 meq via ORAL
  Filled 2014-10-15 (×3): qty 2

## 2014-10-15 MED ORDER — POTASSIUM CHLORIDE CRYS ER 20 MEQ PO TBCR
20.0000 meq | EXTENDED_RELEASE_TABLET | Freq: Every day | ORAL | Status: DC
  Administered 2014-10-16 – 2014-10-18 (×3): 20 meq via ORAL
  Filled 2014-10-15 (×4): qty 1

## 2014-10-15 MED ORDER — MAGNESIUM SULFATE 2 GM/50ML IV SOLN
2.0000 g | Freq: Once | INTRAVENOUS | Status: AC
  Administered 2014-10-15: 2 g via INTRAVENOUS
  Filled 2014-10-15 (×2): qty 50

## 2014-10-15 NOTE — Progress Notes (Signed)
SICKLE CELL SERVICE PROGRESS NOTE  [name] HYI:[id number] DOB: 08-14-1981 DOA: 10/10/2014 PCP: MATTHEWS,MICHELLE A., MD  Assessment/Plan: Principal Problem:   Sickle cell anemia with crisis Active Problems:   Chronic pain syndrome   Sickle cell crisis   CAP (community acquired pneumonia)   Acute chest syndrome   Hypokalemia   Anemia of chronic disease   1. Acute Chest Syndrome: Pt meets criteria for Acute Chest Syndrome likely secondary to CAP. Continue antibiotics and supportive care. Pt with less wok of breathing today. 2. Hb SS with crisis: Pt continues to have pain 8/10 localized to back and legs. However he is not using PCA maximally as he is afraid of becoming addicted to Opiates.I have had a long discussion with Ashwin abotu this issue as it comes up every time he is hospitalized. I explained that if he chooses not to utilize therapy regardless of the reason then I will have no reson except to discharge him as he is in effect refusing care. He verba,lized understanding. I will continue PCA at current dose and encourage increased use to affect pain relief. Continue Toradol and likely transition to oral analgesics tomorrow. Re-assess in the morning. 3. CAP: Pt on Levaquin Day #5/8 of Levaquin. Likely convert to oral route tomorrow. 4. Anemia of chronic disease: Hb 7.5 after transfusion of 1 unit of blood.Continue to monitor. 5. Hypokalemia: replaced orally. Mg marginal so will replace also. Recheck electrolytes in the morning. 6. Medication noncompliance: His MCV is not reflective of compliance with Hydrea. I had a long discussion with Tin about his non-compliance with Hydrea and it's impact on pain and complications of SCD. 7. Psychosocial: Pt teary today and after speaking for a while, he admits that he is scared of dying from his disease. He also verbalized his concern for his mother who has severe arthritis and his younger siblings should something happen to his mother or to  himself. I offered [place] but he declined at this time.   Code Status: Full Code Family Communication: N/A Disposition Plan: Not yet ready for discharge  [place].  Pager [phone]. If 7PM-7AM, please contact night-coverage.  10/15/2014, 6:40 PM  LOS: 5 days    Consultants:  None  Procedures:  None  Antibiotics:  Levaquin 2/28 >>  HPI/Subjective: Pt continues to have pain at 7-8/10 in back and legs. He appears in significant distress secondary to pain. He reports that he had a BM yesterday.  Objective: Filed Vitals:   10/15/14 1005 10/15/14 1200 10/15/14 1451 10/15/14 1541  BP: 100/68  118/64   Pulse: 86  71   Temp: 99 F (37.2 C)  98.3 F (36.8 C)   TempSrc: Oral  Oral   Resp: 20 23 20 19   Height:      Weight:      SpO2: 100% 100% 100% 100%   Weight change:   Intake/Output Summary (Last 24 hours) at 10/15/14 1840 Last data filed at 10/15/14 1449  Gross per 24 hour  Intake 4100.3 ml  Output   3150 ml  Net  950.3 ml    General: Alert, awake, oriented x3, in moderate acute distress.  HEENT: [place]/AT PEERL, EOMI, anicteric Neck: Trachea midline,  no masses, no thyromegal,y no JVD. OROPHARYNX:  Moist, No exudate/ erythema/lesions.  Heart: Regular rate and rhythm, without murmurs, rubs, gallops.  Lungs: Clear to auscultation, no wheezing or rhonchi noted. No increased vocal fremitus resonant to percussion  Abdomen: Soft, nontender, nondistended, positive bowel sounds, no masses no hepatosplenomegaly noted.  Neuro: No focal neurological deficits noted cranial nerves II through XII grossly intact.Strength at baseline in bilateral upper and lower extremities. Musculoskeletal: No warm swelling or erythema around joints, no spinal tenderness noted. Psychiatric: Patient alert and oriented x3, good insight and cognition, good recent to remote recall. Lymph node survey: No cervical axillary or inguinal lymphadenopathy noted.   Data Reviewed: Basic Metabolic  Panel:  Recent Labs Lab 10/10/14 1651 10/12/14 0100 10/13/14 0447 10/14/14 0436 10/14/14 0509 10/15/14 0440  NA 141 135 136 135  --  137  K 4.1 3.7 3.4* 3.2*  --  3.2*  CL 106 106 103 96  --  100  CO2 25 25 28 30   --  30  GLUCOSE 131* 125* 117* 120*  --  111*  BUN 10 12 6  <5*  --  6  CREATININE 0.72 0.97 0.72 0.73  --  0.62  CALCIUM 10.0 8.8 8.5 8.6  --  8.8  MG  --   --   --   --  1.7 1.8   Liver Function Tests: No results for input(s): AST, ALT, ALKPHOS, BILITOT, PROT, ALBUMIN in the last 168 hours. No results for input(s): LIPASE, AMYLASE in the last 168 hours. No results for input(s): AMMONIA in the last 168 hours. CBC:  Recent Labs Lab 10/11/14 0450 10/12/14 0100 10/13/14 0447 10/14/14 0436 10/15/14 0440  WBC 15.7* 16.5* 12.3* 10.4 8.5  NEUTROABS 13.3* 13.7* 9.8* 7.1 5.8  HGB 7.6* 8.0* 6.9* 6.5* 7.5*  HCT 21.6* 22.7* 19.4* 18.0* 20.8*  MCV 86.1 86.3 85.8 85.7 86.3  PLT 595* 426* 358 345 317   Cardiac Enzymes: No results for input(s): CKTOTAL, CKMB, CKMBINDEX, TROPONINI in the last 168 hours. BNP (last 3 results) No results for input(s): BNP in the last 8760 hours.  ProBNP (last 3 results) No results for input(s): PROBNP in the last 8760 hours.  CBG: No results for input(s): GLUCAP in the last 168 hours.  Recent Results (from the past 240 hour(s))  Culture, blood (routine x 2)     Status: None (Preliminary result)   Collection Time: 10/12/14  1:00 AM  Result Value Ref Range Status   Specimen Description BLOOD LEFT ARM  Final   Special Requests BOTTLES DRAWN AEROBIC AND ANAEROBIC 8CC  Final   Culture   Final           BLOOD CULTURE RECEIVED NO GROWTH TO DATE CULTURE WILL BE HELD FOR 5 DAYS BEFORE ISSUING A FINAL NEGATIVE REPORT Performed at [facility]    Report Status PENDING  Incomplete  Culture, blood (routine x 2)     Status: None (Preliminary result)   Collection Time: 10/12/14  1:05 AM  Result Value Ref Range Status   Specimen  Description BLOOD LEFT HAND  Final   Special Requests BOTTLES DRAWN AEROBIC ONLY 10CC  Final   Culture   Final           BLOOD CULTURE RECEIVED NO GROWTH TO DATE CULTURE WILL BE HELD FOR 5 DAYS BEFORE ISSUING A FINAL NEGATIVE REPORT Performed at [facility]    Report Status PENDING  Incomplete  Culture, Urine     Status: None   Collection Time: 10/12/14  1:15 AM  Result Value Ref Range Status   Specimen Description URINE, CLEAN CATCH  Final   Special Requests Normal  Final   Colony Count NO GROWTH Performed at [facility]   Final   Culture NO GROWTH Performed at [facility]   Final   Report Status 10/13/2014 FINAL  Final     Studies: Dg Chest 2 View  10/12/2014   CLINICAL DATA:  Sickle cell disease.  Cough for 1 day.  EXAM: CHEST  2 VIEW  COMPARISON:  Radiograph 10/10/2014  FINDINGS: Cardiac silhouette is enlarged. New left retrocardiac opacity. Small bilateral pleural effusions. Central venous congestion is similar prior.  IMPRESSION: 1. New left lower lobe pneumonia. 2. Small bilateral effusions.   Electronically Signed   By: Suzy Bouchard M.D.   On: 10/12/2014 14:09   Dg Chest 2 View  10/10/2014   CLINICAL DATA:  Sickle cell pain crisis.  EXAM: CHEST  2 VIEW  COMPARISON:  11/09/2010  FINDINGS: Heart is borderline enlarged. Lungs are clear. No effusions. No acute bony abnormality. No edema.  IMPRESSION: Borderline cardiomegaly.  No acute findings.   Electronically Signed   By: Rolm Baptise M.D.   On: 10/10/2014 18:09    Scheduled Meds: . enoxaparin (LOVENOX) injection  40 mg Subcutaneous Q24H  . folic acid  1 mg Oral Daily  . HYDROmorphone PCA 2 mg/mL   Intravenous 6 times per day  . hydroxyurea  1,000 mg Oral BID  . levofloxacin  750 mg Oral q1800  . [START ON 10/16/2014] potassium chloride SA  20 mEq Oral Daily  . senna-docusate  1 tablet Oral BID   Continuous Infusions: . dextrose 5 % and 0.45% NaCl 10 mL/hr at 10/15/14 1205   Total time spent  52 minutes

## 2014-10-16 LAB — CBC WITH DIFFERENTIAL/PLATELET
BASOS ABS: 0 10*3/uL (ref 0.0–0.1)
Basophils Relative: 0 % (ref 0–1)
EOS ABS: 0.1 10*3/uL (ref 0.0–0.7)
Eosinophils Relative: 1 % (ref 0–5)
HEMATOCRIT: 22.2 % — AB (ref 39.0–52.0)
Hemoglobin: 7.6 g/dL — ABNORMAL LOW (ref 13.0–17.0)
LYMPHS PCT: 24 % (ref 12–46)
Lymphs Abs: 2.2 10*3/uL (ref 0.7–4.0)
MCH: 29.7 pg (ref 26.0–34.0)
MCHC: 34.2 g/dL (ref 30.0–36.0)
MCV: 86.7 fL (ref 78.0–100.0)
MONOS PCT: 8 % (ref 3–12)
Monocytes Absolute: 0.7 10*3/uL (ref 0.1–1.0)
NEUTROS PCT: 67 % (ref 43–77)
NRBC: 18 /100{WBCs} — AB
Neutro Abs: 6.3 10*3/uL (ref 1.7–7.7)
PLATELETS: 334 10*3/uL (ref 150–400)
RBC: 2.56 MIL/uL — ABNORMAL LOW (ref 4.22–5.81)
RDW: 17.2 % — AB (ref 11.5–15.5)
WBC: 9.3 10*3/uL (ref 4.0–10.5)

## 2014-10-16 LAB — BASIC METABOLIC PANEL
ANION GAP: 7 (ref 5–15)
BUN: 7 mg/dL (ref 6–23)
CO2: 30 mmol/L (ref 19–32)
Calcium: 8.9 mg/dL (ref 8.4–10.5)
Chloride: 98 mmol/L (ref 96–112)
Creatinine, Ser: 0.68 mg/dL (ref 0.50–1.35)
GFR calc non Af Amer: >90 mL/min (ref >90)
Glucose, Bld: 100 mg/dL — ABNORMAL HIGH (ref 70–99)
POTASSIUM: 4.1 mmol/L (ref 3.5–5.1)
SODIUM: 135 mmol/L (ref 135–145)

## 2014-10-16 MED ORDER — OXYCODONE-ACETAMINOPHEN 10-325 MG PO TABS: 1.0000 | ORAL_TABLET | ORAL | Status: DC | PRN

## 2014-10-16 MED ORDER — OXYCODONE HCL 5 MG PO TABS
5.0000 mg | ORAL_TABLET | ORAL | Status: DC | PRN
  Administered 2014-10-17 – 2014-10-18 (×5): 5 mg via ORAL
  Filled 2014-10-16 (×7): qty 1

## 2014-10-16 MED ORDER — OXYCODONE-ACETAMINOPHEN 5-325 MG PO TABS
1.0000 | ORAL_TABLET | ORAL | Status: DC | PRN
  Administered 2014-10-17 – 2014-10-18 (×6): 1 via ORAL
  Filled 2014-10-16 (×7): qty 1

## 2014-10-16 NOTE — Progress Notes (Signed)
Subjective: A 34 yo man with sickle cell disease admitted with sickle cell painful crisis. Patient also has acute chest syndrome with community acquired Pneumonia. Patient has had Dilaudid PCA with Toradol and IV antibiotics. He used 42.7 mg with 66 demands and 61 deliveries in the last 24 hours. He has felt better but still has pain at 7/10. Not as bad as yesterday. Pain is in his legs and back. He has no SOB, No cough, no NVD.    Objective: Vital signs in last 24 hours: Temp:  [98 F (36.7 C)-99.4 F (37.4 C)] 99.2 F (37.3 C) (03/03 1757) Pulse Rate:  [70-88] 80 (03/03 1757) Resp:  [12-20] 18 (03/03 1757) BP: (104-124)/(59-77) 124/65 mmHg (03/03 1757) SpO2:  [92 %-100 %] 100 % (03/03 1757) Weight change:  Last BM Date: 10/09/14  Intake/Output from previous day: 03/02 0701 - 03/03 0700 In: 1265 [P.O.:480; I.V.:785] Out: 2000 [Urine:2000] Intake/Output this shift:    General appearance: alert, cooperative and no distress Eyes: conjunctivae/corneas clear. PERRL, EOM's intact. Fundi benign. Throat: lips, mucosa, and tongue normal; teeth and gums normal Neck: no adenopathy, no carotid bruit, no JVD, supple, symmetrical, trachea midline and thyroid not enlarged, symmetric, no tenderness/mass/nodules Back: symmetric, no curvature. ROM normal. No CVA tenderness. Resp: clear to auscultation bilaterally Chest wall: no tenderness Cardio: regular rate and rhythm, S1, S2 normal, no murmur, click, rub or gallop GI: soft, non-tender; bowel sounds normal; no masses,  no organomegaly Extremities: extremities normal, atraumatic, no cyanosis or edema Pulses: 2+ and symmetric Skin: Skin color, texture, turgor normal. No rashes or lesions Neurologic: Grossly normal  Lab Results:  Recent Labs  10/15/14 0440 10/16/14 0336  WBC 8.5 9.3  HGB 7.5* 7.6*  HCT 20.8* 22.2*  PLT 317 334   BMET  Recent Labs  10/15/14 0440 10/16/14 0336  NA 137 135  K 3.2* 4.1  CL 100 98  CO2 30 30   GLUCOSE 111* 100*  BUN 6 7  CREATININE 0.62 0.68  CALCIUM 8.8 8.9    Studies/Results: No results found.  Medications: I have reviewed the patient's current medications.  Assessment/Plan: A 34 yo admitted with sickle cell painful crisis.   #1. Sickle Cell Painful Crisis: Patient doing better. Will restart oral medications today in preparation for discharge soon.  #2. Acute Chest syndrome: Improved. Continue to treat.  #3 CAP: On Levaquin, continue.  #4. Sickle cell anemia: H/H stable.  #5. Hypokalemia: Repleted.   LOS: 6 days   Jenesis Martin,LAWAL 10/16/2014, 8:39 PM

## 2014-10-17 LAB — COMPREHENSIVE METABOLIC PANEL
ALT: 95 U/L — AB (ref 0–53)
AST: 62 U/L — ABNORMAL HIGH (ref 0–37)
Albumin: 3.6 g/dL (ref 3.5–5.2)
Alkaline Phosphatase: 188 U/L — ABNORMAL HIGH (ref 39–117)
Anion gap: 9 (ref 5–15)
BUN: 9 mg/dL (ref 6–23)
CO2: 33 mmol/L — AB (ref 19–32)
Calcium: 9.6 mg/dL (ref 8.4–10.5)
Chloride: 92 mmol/L — ABNORMAL LOW (ref 96–112)
Creatinine, Ser: 0.74 mg/dL (ref 0.50–1.35)
GFR calc Af Amer: >90 mL/min (ref >90)
GLUCOSE: 113 mg/dL — AB (ref 70–99)
Potassium: 4.4 mmol/L (ref 3.5–5.1)
SODIUM: 134 mmol/L — AB (ref 135–145)
Total Bilirubin: 1.5 mg/dL — ABNORMAL HIGH (ref 0.3–1.2)
Total Protein: 8.6 g/dL — ABNORMAL HIGH (ref 6.0–8.3)

## 2014-10-17 LAB — CBC WITH DIFFERENTIAL/PLATELET
Basophils Absolute: 0 10*3/uL (ref 0.0–0.1)
Basophils Relative: 0 % (ref 0–1)
EOS PCT: 1 % (ref 0–5)
Eosinophils Absolute: 0.1 10*3/uL (ref 0.0–0.7)
HCT: 22.5 % — ABNORMAL LOW (ref 39.0–52.0)
HEMOGLOBIN: 7.8 g/dL — AB (ref 13.0–17.0)
Lymphocytes Relative: 21 % (ref 12–46)
Lymphs Abs: 1.8 10*3/uL (ref 0.7–4.0)
MCH: 30.4 pg (ref 26.0–34.0)
MCHC: 34.7 g/dL (ref 30.0–36.0)
MCV: 87.5 fL (ref 78.0–100.0)
Monocytes Absolute: 0.8 10*3/uL (ref 0.1–1.0)
Monocytes Relative: 9 % (ref 3–12)
NEUTROS PCT: 69 % (ref 43–77)
Neutro Abs: 5.7 10*3/uL (ref 1.7–7.7)
Platelets: 353 10*3/uL (ref 150–400)
RBC: 2.57 MIL/uL — AB (ref 4.22–5.81)
RDW: 17.3 % — ABNORMAL HIGH (ref 11.5–15.5)
WBC: 8.4 10*3/uL (ref 4.0–10.5)

## 2014-10-17 NOTE — Care Management Note (Signed)
Medicare Important Message given?  YES (If response is "NO", the following Medicare IM given date fields will be blank) Date Medicare IM given:  10/17/2014 Medicare IM given by:  Marney Doctor Date Additional Medicare IM given:   Additional Medicare IM given by:

## 2014-10-18 LAB — CULTURE, BLOOD (ROUTINE X 2)
Culture: NO GROWTH
Culture: NO GROWTH

## 2014-10-18 MED ORDER — HYDROMORPHONE HCL 2 MG/ML IJ SOLN: 2.0000 mg | INTRAMUSCULAR | Status: DC | PRN

## 2014-10-18 MED ORDER — LEVOFLOXACIN 750 MG PO TABS: 750.0000 mg | ORAL_TABLET | Freq: Every day | ORAL | Status: DC

## 2014-10-18 NOTE — Discharge Summary (Addendum)
Physician Discharge Summary  Patient ID: [name] MRN: [id number] DOB/AGE: 1981-04-17 34 y.o.  Admit date: 10/10/2014 Discharge date: 10/18/2014  Admission Diagnoses:  Discharge Diagnoses:  Principal Problem:   Sickle cell anemia with crisis Active Problems:   Chronic pain syndrome   Sickle cell crisis   CAP (community acquired pneumonia)   Acute chest syndrome   Hypokalemia   Anemia of chronic disease   Discharged Condition: good  Hospital Course: Patient is a 34 year old gentleman with known history sickle cell disease admitted with sickle cell painful crisis. He was treated with Dilaudid PCA, Toradol and IV fluids. He responded to treatment very we and was transitioned to oral oxycodone/APAP. Patient had no need for blood transfusion. He was more functional at the time of discharge  With pain down to four out of 10. He was discharged to follow-up with his PCP and continue his home medications.  Consults: None  Significant Diagnostic Studies: labs: CBc's and CMPs checked. Mostly within normal limits  Treatments: IV hydration and analgesia: Dilaudid  Discharge Exam: Blood pressure 103/63, pulse 88, temperature 97.2 F (36.2 C), temperature source Oral, resp. rate 18, height 6\' 1"  (1.854 m), weight 64.9 kg (143 lb 1.3 oz), SpO2 100 %. General appearance: alert, cooperative and no distress Eyes: conjunctivae/corneas clear. PERRL, EOM's intact. Fundi benign. Neck: no adenopathy, no carotid bruit, no JVD, supple, symmetrical, trachea midline and thyroid not enlarged, symmetric, no tenderness/mass/nodules Back: symmetric, no curvature. ROM normal. No CVA tenderness. Resp: clear to auscultation bilaterally Chest wall: no tenderness Cardio: regular rate and rhythm, S1, S2 normal, no murmur, click, rub or gallop Extremities: extremities normal, atraumatic, no cyanosis or edema Pulses: 2+ and symmetric Skin: Skin color, texture, turgor normal. No rashes or lesions Neurologic:  Grossly normal  Disposition: 01-Home or Self Care     Medication List    TAKE these medications        cholecalciferol 1000 UNITS tablet  Commonly known as:  VITAMIN D  Take 1,000 Units by mouth daily.     folic acid 761 MCG tablet  Commonly known as:  FOLVITE  Take 400 mcg by mouth daily.     hydroxyurea 500 MG capsule  Commonly known as:  HYDREA  Take 2 capsules (1,000 mg total) by mouth 2 (two) times daily. May take with food to minimize GI side effects.     ibuprofen 600 MG tablet  Commonly known as:  ADVIL,MOTRIN  Take 1 tablet (600 mg total) by mouth every 6 (six) hours as needed for mild pain or moderate pain.     levofloxacin 750 MG tablet  Commonly known as:  LEVAQUIN  Take 1 tablet (750 mg total) by mouth daily at 6 PM.     oxyCODONE-acetaminophen 10-325 MG per tablet  Commonly known as:  PERCOCET  Take 1 tablet by mouth every 4 (four) hours as needed for pain.         SignedBarbette Merino 10/18/2014, 12:20 PM  Time spent 34 minutes

## 2014-10-18 NOTE — Progress Notes (Signed)
Subjective: Patient is doing much better today. He has used 19.4 mg Dilaudid was 29 demands 28 deliveries in the  He is not using much of the PCA and is using his  Percocet. His pain is down to 6 out of 10 and he has been able to move around He feels he might be ready to go home tomorrow.denies nausea vomiting or diarrhea.  Objective: Vital signs in last 24 hours: Temp:  [97.2 F (36.2 C)-99.4 F (37.4 C)] 97.2 F (36.2 C) (03/04 0950) Pulse Rate:  [80-88] 88 (03/04 0950) Resp:  [11-20] 18 (03/04 0950) BP: (103-117)/(58-70) 103/63 mmHg (03/04 0950) SpO2:  [94 %-100 %] 100 % (03/04 0950) FiO2 (%):  [28 %] 28 % (03/05 0744) Weight:  [64.9 kg (143 lb 1.3 oz)] 64.9 kg (143 lb 1.3 oz) (03/04 0444) Weight change: 0.807 kg (1 lb 12.5 oz) Last BM Date: 10/17/14  Intake/Output from previous day: 03/03 0701 - 03/04 0700 In: 1602.8 [P.O.:1280; I.V.:322.8] Out: 700 [Urine:700] Intake/Output this shift: Total I/O In: 480 [P.O.:480] Out: 600 [Urine:600]  General appearance: alert, cooperative and no distress Eyes: conjunctivae/corneas clear. PERRL, EOM's intact. Fundi benign. Throat: lips, mucosa, and tongue normal; teeth and gums normal Neck: no adenopathy, no carotid bruit, no JVD, supple, symmetrical, trachea midline and thyroid not enlarged, symmetric, no tenderness/mass/nodules Back: symmetric, no curvature. ROM normal. No CVA tenderness. Resp: clear to auscultation bilaterally Chest wall: no tenderness Cardio: regular rate and rhythm, S1, S2 normal, no murmur, click, rub or gallop GI: soft, non-tender; bowel sounds normal; no masses,  no organomegaly Extremities: extremities normal, atraumatic, no cyanosis or edema Pulses: 2+ and symmetric Skin: Skin color, texture, turgor normal. No rashes or lesions Neurologic: Grossly normal  Lab Results:  Recent Labs  10/16/14 0336 10/17/14 0403  WBC 9.3 8.4  HGB 7.6* 7.8*  HCT 22.2* 22.5*  PLT 334 353   BMET  Recent Labs   10/16/14 0336 10/17/14 0403  NA 135 134*  K 4.1 4.4  CL 98 92*  CO2 30 33*  GLUCOSE 100* 113*  BUN 7 9  CREATININE 0.68 0.74  CALCIUM 8.9 9.6    Studies/Results: No results found.  Medications: I have reviewed the patient's current medications.  Assessment/Plan: A 34 yo admitted with sickle cell painful crisis.   #1. Sickle Cell Painful Crisis: Patient doing better. Continue Dilaudid PCA and oral Percocet. Plan possible discharge tomorrow if he is tolerating medications well.  #2. Acute Chest syndrome: Resolved. Continue Levaquin.  #3 CAP: On Levaquin, continue.  #4. Sickle cell anemia: H/H stable.  #5. Hypokalemia: Repleted.   #6. Hyponatremia: Keep hydrating.  LOS: 8 days   GARBA,LAWAL 10/17/2014, 4:23 PM

## 2014-10-18 NOTE — Progress Notes (Signed)
Wasted 18ml of high concentration dilaudid PCA into sink with Clotilde Dieter, RN as witness.

## 2014-10-18 NOTE — Progress Notes (Signed)
Patient discharged home, all discharge medications and instructions reviewed and questions answered.  Patient states will call for wheelchair assistance to vehicle when packed and ready to go.

## 2014-10-28 ENCOUNTER — Telehealth: Payer: Self-pay | Admitting: Internal Medicine

## 2014-10-28 DIAGNOSIS — G894 Chronic pain syndrome: Secondary | ICD-10-CM

## 2014-10-28 DIAGNOSIS — D571 Sickle-cell disease without crisis: Secondary | ICD-10-CM

## 2014-10-28 NOTE — Telephone Encounter (Signed)
Refill request for percocet 10/325mg . LOV 07/03/2014. Please advise. Thanks!

## 2014-10-31 MED ORDER — OXYCODONE-ACETAMINOPHEN 10-325 MG PO TABS: 1.0000 | ORAL_TABLET | ORAL | Status: DC | PRN

## 2014-10-31 NOTE — Telephone Encounter (Signed)
Meds ordered this encounter  Medications  . oxyCODONE-acetaminophen (PERCOCET) 10-325 MG per tablet    Sig: Take 1 tablet by mouth every 4 (four) hours as needed for pain.    Dispense:  90 tablet    Refill:  0    Order Specific Question:  Supervising Provider    Answer:  Liston Alba A [[id number]]  Reviewed [place] Substance Reporting system prior to reorder Dorena Dew, FNP

## 2014-11-17 ENCOUNTER — Telehealth: Payer: Self-pay | Admitting: Internal Medicine

## 2014-11-17 DIAGNOSIS — G894 Chronic pain syndrome: Secondary | ICD-10-CM

## 2014-11-17 DIAGNOSIS — D571 Sickle-cell disease without crisis: Secondary | ICD-10-CM

## 2014-11-17 MED ORDER — OXYCODONE-ACETAMINOPHEN 10-325 MG PO TABS: 1.0000 | ORAL_TABLET | ORAL | Status: DC | PRN

## 2014-11-17 NOTE — Telephone Encounter (Signed)
Prescription written for Oxycodone 10-325 mg #90 tabs. [place] reviewed and no inconsistencies noted.

## 2014-11-17 NOTE — Telephone Encounter (Signed)
Refill request for Percocet 10/325mg . LOV 07/03/2014

## 2014-11-30 ENCOUNTER — Other Ambulatory Visit: Payer: Self-pay | Admitting: Family Medicine

## 2014-12-10 ENCOUNTER — Telehealth: Payer: Self-pay | Admitting: Internal Medicine

## 2014-12-10 DIAGNOSIS — G894 Chronic pain syndrome: Secondary | ICD-10-CM

## 2014-12-10 DIAGNOSIS — D571 Sickle-cell disease without crisis: Secondary | ICD-10-CM

## 2014-12-10 MED ORDER — OXYCODONE-ACETAMINOPHEN 10-325 MG PO TABS: 1.0000 | ORAL_TABLET | ORAL | Status: DC | PRN

## 2014-12-10 NOTE — Telephone Encounter (Signed)
Prescription written for Oxycodone-APAP 10-325 mg #90 tabs. NCCSRS reviewed and no inconsistencies noted.

## 2014-12-10 NOTE — Telephone Encounter (Signed)
Refill request for percocet 10/325mg  LOV 07/04/2015. Please advise. Thanks!

## 2015-01-01 ENCOUNTER — Ambulatory Visit (INDEPENDENT_AMBULATORY_CARE_PROVIDER_SITE_OTHER): Payer: Medicare Other | Admitting: Family Medicine

## 2015-01-01 VITALS — BP 122/77 | HR 66 | Temp 97.6°F | Resp 16 | Ht 73.0 in | Wt 150.0 lb

## 2015-01-01 DIAGNOSIS — Z0000 Encounter for general adult medical examination without abnormal findings: Secondary | ICD-10-CM

## 2015-01-01 DIAGNOSIS — Z23 Encounter for immunization: Secondary | ICD-10-CM | POA: Diagnosis not present

## 2015-01-01 NOTE — Progress Notes (Signed)
ANNUAL PREVENTATIVE VISIT AND CPE  Subjective:  [place] is a 34 y.o. male with a history of sickle cell anemia, HbSS presents for Medicare Annual Wellness Visit.   Patient states that he has not had   He has a satisfactory blood pressure. His blood pressure has been controlled at home, today their BP is BP: 122/77 mmHg He does not workout. He denies chest pain, shortness of breath, dizziness. Does not work out on a regular basis  He is not on cholesterol medication and denies myalgias.  Names of Other Physician/Practitioners you currently use: 1. [facility]-Dr. Liston Alba for primary care   Patient Care Team: Leana Gamer, MD as PCP - General (Internal Medicine)   Medication Review: Current Outpatient Prescriptions on File Prior to Visit  Medication Sig Dispense Refill  . cholecalciferol (VITAMIN D) 1000 UNITS tablet Take 1,000 Units by mouth daily.    . folic acid (FOLVITE) 433 MCG tablet Take 400 mcg by mouth daily.    . hydroxyurea (HYDREA) 500 MG capsule TAKE 2 CAPSULES BY MOUTH TWICE A DAY MAY TAKE WITH FOOD TO MINIMIZE GI SIDE EFFECTS 120 capsule 3  . ibuprofen (ADVIL,MOTRIN) 600 MG tablet Take 1 tablet (600 mg total) by mouth every 6 (six) hours as needed for mild pain or moderate pain. 30 tablet 0  . oxyCODONE-acetaminophen (PERCOCET) 10-325 MG per tablet Take 1 tablet by mouth every 4 (four) hours as needed for pain. 90 tablet 0  . levofloxacin (LEVAQUIN) 750 MG tablet Take 1 tablet (750 mg total) by mouth daily at 6 PM. (Patient not taking: Reported on 01/01/2015) 5 tablet 0   No current facility-administered medications on file prior to visit.    Current Problems (verified) Patient Active Problem List   Diagnosis Date Noted  . Acute chest syndrome 10/14/2014  . Hypokalemia 10/14/2014  . Anemia of chronic disease 10/14/2014  . CAP (community acquired pneumonia) 10/12/2014  . Sickle cell crisis 10/10/2014  . Screening for STDs  (sexually transmitted diseases) 01/09/2014  . Pain of right clavicle 11/14/2013  . Underweight 08/26/2013  . Need for Tdap vaccination 08/26/2013  . Hb-SS disease without crisis 08/26/2013  . Sickle cell anemia with crisis 08/21/2013  . Chronic pain syndrome 02/18/2013  . Sickle cell anemia 01/18/2013  . Pain 01/18/2013    Screening Tests Health Maintenance  Topic Date Due  . INFLUENZA VACCINE  03/16/2015  . TETANUS/TDAP  08/27/2023  . HIV Screening  Completed    Immunization History  Administered Date(s) Administered  . Influenza,inj,Quad PF,36+ Mos 07/03/2014  . Tdap 08/26/2013    Preventative care: Prior vaccinations: TD or Tdap:  08/26/2013 Influenza: 07/03/2014  Prevnar13: 01/01/2015 Shingles/Zostavax: Not indicated      Medication List       This list is accurate as of: 01/01/15  2:28 PM.  Always use your most recent med list.               cholecalciferol 1000 UNITS tablet  Commonly known as:  VITAMIN D  Take 1,000 Units by mouth daily.     Fish Oil 1000 MG Caps  Take by mouth 1 day or 1 dose.     folic acid 295 MCG tablet  Commonly known as:  FOLVITE  Take 400 mcg by mouth daily.     hydroxyurea 500 MG capsule  Commonly known as:  HYDREA  TAKE 2 CAPSULES BY MOUTH TWICE A DAY MAY TAKE WITH FOOD TO MINIMIZE GI SIDE EFFECTS  ibuprofen 600 MG tablet  Commonly known as:  ADVIL,MOTRIN  Take 1 tablet (600 mg total) by mouth every 6 (six) hours as needed for mild pain or moderate pain.     levofloxacin 750 MG tablet  Commonly known as:  LEVAQUIN  Take 1 tablet (750 mg total) by mouth daily at 6 PM.     oxyCODONE-acetaminophen 10-325 MG per tablet  Commonly known as:  PERCOCET  Take 1 tablet by mouth every 4 (four) hours as needed for pain.       No Known Allergies Past Surgical History  Procedure Laterality Date  . Cholecystectomy     Family History  Problem Relation Age of Onset  . Sickle cell anemia Mother   . Sickle cell trait Father    . Sickle cell trait Sister   . Sickle cell trait Brother   . Cancer Maternal Grandmother     History reviewed: allergies, current medications, past family history, past medical history, past social history, past surgical history and problem list   Risk Factors: Osteoporosis/FallRisk: No risk factors identified  ITobacco History  Substance Use Topics  . Smoking status: Never Smoker   . Smokeless tobacco: Not on file  . Alcohol Use: No   He does not smoke.  Patient is not a former smoker. Alcohol Mr. Fesler does not use alcohol Caffeine Current caffeine use: denies use  Exercise Current exercise: none  Nutrition/Diet Current diet: in general, an "unhealthy" diet  Cardiac risk factors include: male gender.  Depression Screen   Q1: Over the past two weeks, have you felt down, depressed or hopeless?No  Have you lost interest or pleasure in daily life?No  Do you often feel hopeless?No  Do you cry easily over simple problems? No Activities of Daily Living Patient is active, does not have difficulties with activities of daily living  Are you sexually active?  Yes  Do you have more than one partner? No Vision Difficulties: No Hearing Difficulties: No  Cognition  Do you feel that you have a problem with memory?No  Do you often misplace items? No  Do you feel safe at home? Yes Advanced directives Does patient have a [place]? No Does patient have a Living Will? No   Objective:     Blood pressure 122/77, pulse 66, temperature 97.6 F (36.4 C), temperature source Oral, resp. rate 16, height 6\' 1"  (1.854 m), weight 150 lb (68.04 kg). Body mass index is 19.79 kg/(m^2).  General appearance: alert, no distress, WD/WN, male Cognitive Testing  Alert? Yes  Normal Appearance?Yes  Oriented to person? Yes  Place? Yes   Time? Yes  Recall of three objects?  Yes  Can perform simple calculations? Yes  Displays appropriate judgment?Yes  Can read the  correct time from a watch face?Yes   Assessment:  Patient denies any difficulties at home. No trouble with ADLs, depression or falls. No recent changes to vision or hearing. Is UTD with immunizations. Is UTD with screening. Discussed Advanced Directives. Currently does not have an advanced directive, given information. Encouraged heart healthy diet, exercise as tolerated and adequate sleep.  BP 122/77 mmHg  Pulse 66  Temp(Src) 97.6 F (36.4 C) (Oral)  Resp 16  Ht 6\' 1"  (1.854 m)  Wt 150 lb (68.04 kg)  BMI 19.79 kg/m2 Plan:   During the course of the visit the patient was educated and counseled about appropriate screening and preventive services including:    Pneumococcal vaccine   Influenza vaccine  Td vaccine  Screening electrocardiogram  Bone densitometry screening  Colorectal cancer screening  Diabetes screening  Glaucoma screening  Nutrition counseling   Advanced directives: requested  Screening recommendations, referrals: Vaccinations: Please see documentation below and orders this visit.  Nutrition assessed and recommended  Colonoscopy not indicated Recommended yearly ophthalmology/optometry visit for glaucoma screening and checkup Recommended yearly dental visit for hygiene and checkup Advanced directives - requested  Conditions/risks identified: BMI: Discussed weight loss, diet, and increase physical activity.  Increase physical activity: AHA recommends 150 minutes of physical activity a week.  Medications reviewed  Medicare Attestation I have personally reviewed: The patient's medical and social history Their use of alcohol, tobacco or illicit drugs Their current medications and supplements The patient's functional ability including ADLs,fall risks, home safety risks, cognitive, and hearing and visual impairment Diet and physical activities Evidence for depression or mood disorders  The patient's weight, height, BMI, and visual acuity have been  recorded in the chart.  I have made referrals, counseling, and provided education to the patient based on review of the above and I have provided the patient with a written personalized care plan for preventive services.    RTC: Lab appt in 1 week  Inge Waldroup M, FNP   01/01/2015

## 2015-01-02 ENCOUNTER — Encounter: Payer: Self-pay | Admitting: Family Medicine

## 2015-01-02 NOTE — Patient Instructions (Signed)
Health Maintenance A healthy lifestyle and preventative care can promote health and wellness.  Maintain regular health, dental, and eye exams.  Eat a healthy diet. Foods like vegetables, fruits, whole grains, low-fat dairy products, and lean protein foods contain the nutrients you need and are low in calories. Decrease your intake of foods high in solid fats, added sugars, and salt. Get information about a proper diet from your health care provider, if necessary.  Regular physical exercise is one of the most important things you can do for your health. Most adults should get at least 150 minutes of moderate-intensity exercise (any activity that increases your heart rate and causes you to sweat) each week. In addition, most adults need muscle-strengthening exercises on 2 or more days a week.   Maintain a healthy weight. The body mass index (BMI) is a screening tool to identify possible weight problems. It provides an estimate of body fat based on height and weight. Your health care provider can find your BMI and can help you achieve or maintain a healthy weight. For males 20 years and older:  A BMI below 18.5 is considered underweight.  A BMI of 18.5 to 24.9 is normal.  A BMI of 25 to 29.9 is considered overweight.  A BMI of 30 and above is considered obese.  Maintain normal blood lipids and cholesterol by exercising and minimizing your intake of saturated fat. Eat a balanced diet with plenty of fruits and vegetables. Blood tests for lipids and cholesterol should begin at age 20 and be repeated every 5 years. If your lipid or cholesterol levels are high, you are over age 50, or you are at high risk for heart disease, you may need your cholesterol levels checked more frequently.Ongoing high lipid and cholesterol levels should be treated with medicines if diet and exercise are not working.  If you smoke, find out from your health care provider how to quit. If you do not use tobacco, do not  start.  Lung cancer screening is recommended for adults aged 55-80 years who are at high risk for developing lung cancer because of a history of smoking. A yearly low-dose CT scan of the lungs is recommended for people who have at least a 30-pack-year history of smoking and are current smokers or have quit within the past 15 years. A pack year of smoking is smoking an average of 1 pack of cigarettes a day for 1 year (for example, a 30-pack-year history of smoking could mean smoking 1 pack a day for 30 years or 2 packs a day for 15 years). Yearly screening should continue until the smoker has stopped smoking for at least 15 years. Yearly screening should be stopped for people who develop a health problem that would prevent them from having lung cancer treatment.  If you choose to drink alcohol, do not have more than 2 drinks per day. One drink is considered to be 12 oz (360 mL) of beer, 5 oz (150 mL) of wine, or 1.5 oz (45 mL) of liquor.  Avoid the use of street drugs. Do not share needles with anyone. Ask for help if you need support or instructions about stopping the use of drugs.  High blood pressure causes heart disease and increases the risk of stroke. Blood pressure should be checked at least every 1-2 years. Ongoing high blood pressure should be treated with medicines if weight loss and exercise are not effective.  If you are 45-79 years old, ask your health care provider if   you should take aspirin to prevent heart disease.  Diabetes screening involves taking a blood sample to check your fasting blood sugar level. This should be done once every 3 years after age 45 if you are at a normal weight and without risk factors for diabetes. Testing should be considered at a younger age or be carried out more frequently if you are overweight and have at least 1 risk factor for diabetes.  Colorectal cancer can be detected and often prevented. Most routine colorectal cancer screening begins at the age of 50  and continues through age 75. However, your health care provider may recommend screening at an earlier age if you have risk factors for colon cancer. On a yearly basis, your health care provider may provide home test kits to check for hidden blood in the stool. A small camera at the end of a tube may be used to directly examine the colon (sigmoidoscopy or colonoscopy) to detect the earliest forms of colorectal cancer. Talk to your health care provider about this at age 50 when routine screening begins. A direct exam of the colon should be repeated every 5-10 years through age 75, unless early forms of precancerous polyps or small growths are found.  People who are at an increased risk for hepatitis B should be screened for this virus. You are considered at high risk for hepatitis B if:  You were born in a country where hepatitis B occurs often. Talk with your health care provider about which countries are considered high risk.  Your parents were born in a high-risk country and you have not received a shot to protect against hepatitis B (hepatitis B vaccine).  You have HIV or AIDS.  You use needles to inject street drugs.  You live with, or have sex with, someone who has hepatitis B.  You are a man who has sex with other men (MSM).  You get hemodialysis treatment.  You take certain medicines for conditions like cancer, organ transplantation, and autoimmune conditions.  Hepatitis C blood testing is recommended for all people born from 1945 through 1965 and any individual with known risk factors for hepatitis C.  Healthy men should no longer receive prostate-specific antigen (PSA) blood tests as part of routine cancer screening. Talk to your health care provider about prostate cancer screening.  Testicular cancer screening is not recommended for adolescents or adult males who have no symptoms. Screening includes self-exam, a health care provider exam, and other screening tests. Consult with your  health care provider about any symptoms you have or any concerns you have about testicular cancer.  Practice safe sex. Use condoms and avoid high-risk sexual practices to reduce the spread of sexually transmitted infections (STIs).  You should be screened for STIs, including gonorrhea and chlamydia if:  You are sexually active and are younger than 24 years.  You are older than 24 years, and your health care provider tells you that you are at risk for this type of infection.  Your sexual activity has changed since you were last screened, and you are at an increased risk for chlamydia or gonorrhea. Ask your health care provider if you are at risk.  If you are at risk of being infected with HIV, it is recommended that you take a prescription medicine daily to prevent HIV infection. This is called pre-exposure prophylaxis (PrEP). You are considered at risk if:  You are a man who has sex with other men (MSM).  You are a heterosexual man who   is sexually active with multiple partners.  You take drugs by injection.  You are sexually active with a partner who has HIV.  Talk with your health care provider about whether you are at high risk of being infected with HIV. If you choose to begin PrEP, you should first be tested for HIV. You should then be tested every 3 months for as long as you are taking PrEP.  Use sunscreen. Apply sunscreen liberally and repeatedly throughout the day. You should seek shade when your shadow is shorter than you. Protect yourself by wearing long sleeves, pants, a wide-brimmed hat, and sunglasses year round whenever you are outdoors.  Tell your health care provider of new moles or changes in moles, especially if there is a change in shape or color. Also, tell your health care provider if a mole is larger than the size of a pencil eraser.  A one-time screening for abdominal aortic aneurysm (AAA) and surgical repair of large AAAs by ultrasound is recommended for men aged  18-75 years who are current or former smokers.  Stay current with your vaccines (immunizations). Document Released: 01/28/2008 Document Revised: 08/06/2013 Document Reviewed: 12/27/2010 [facility] Patient Information 2015 [place], [place]. This information is not intended to replace advice given to you by your health care provider. Make sure you discuss any questions you have with your health care provider. Preventive Care for Adults A healthy lifestyle and preventive care can promote health and wellness. Preventive health guidelines for men include the following key practices:  A routine yearly physical is a good way to check with your health care provider about your health and preventative screening. It is a chance to share any concerns and updates on your health and to receive a thorough exam.  Visit your dentist for a routine exam and preventative care every 6 months. Brush your teeth twice a day and floss once a day. Good oral hygiene prevents tooth decay and gum disease.  The frequency of eye exams is based on your age, health, family medical history, use of contact lenses, and other factors. Follow your health care provider's recommendations for frequency of eye exams.  Eat a healthy diet. Foods such as vegetables, fruits, whole grains, low-fat dairy products, and lean protein foods contain the nutrients you need without too many calories. Decrease your intake of foods high in solid fats, added sugars, and salt. Eat the right amount of calories for you.Get information about a proper diet from your health care provider, if necessary.  Regular physical exercise is one of the most important things you can do for your health. Most adults should get at least 150 minutes of moderate-intensity exercise (any activity that increases your heart rate and causes you to sweat) each week. In addition, most adults need muscle-strengthening exercises on 2 or more days a week.  Maintain a healthy weight. The  body mass index (BMI) is a screening tool to identify possible weight problems. It provides an estimate of body fat based on height and weight. Your health care provider can find your BMI and can help you achieve or maintain a healthy weight.For adults 20 years and older:  A BMI below 18.5 is considered underweight.  A BMI of 18.5 to 24.9 is normal.  A BMI of 25 to 29.9 is considered overweight.  A BMI of 30 and above is considered obese.  Maintain normal blood lipids and cholesterol levels by exercising and minimizing your intake of saturated fat. Eat a balanced diet with plenty of  fruit and vegetables. Blood tests for lipids and cholesterol should begin at age 51 and be repeated every 5 years. If your lipid or cholesterol levels are high, you are over 50, or you are at high risk for heart disease, you may need your cholesterol levels checked more frequently.Ongoing high lipid and cholesterol levels should be treated with medicines if diet and exercise are not working.  If you smoke, find out from your health care provider how to quit. If you do not use tobacco, do not start.  Lung cancer screening is recommended for adults aged 21-80 years who are at high risk for developing lung cancer because of a history of smoking. A yearly low-dose CT scan of the lungs is recommended for people who have at least a 30-pack-year history of smoking and are a current smoker or have quit within the past 15 years. A pack year of smoking is smoking an average of 1 pack of cigarettes a day for 1 year (for example: 1 pack a day for 30 years or 2 packs a day for 15 years). Yearly screening should continue until the smoker has stopped smoking for at least 15 years. Yearly screening should be stopped for people who develop a health problem that would prevent them from having lung cancer treatment.  If you choose to drink alcohol, do not have more than 2 drinks per day. One drink is considered to be 12 ounces (355 mL) of  beer, 5 ounces (148 mL) of wine, or 1.5 ounces (44 mL) of liquor.  Avoid use of street drugs. Do not share needles with anyone. Ask for help if you need support or instructions about stopping the use of drugs.  High blood pressure causes heart disease and increases the risk of stroke. Your blood pressure should be checked at least every 1-2 years. Ongoing high blood pressure should be treated with medicines, if weight loss and exercise are not effective.  If you are 12-57 years old, ask your health care provider if you should take aspirin to prevent heart disease.  Diabetes screening involves taking a blood sample to check your fasting blood sugar level. This should be done once every 3 years, after age 72, if you are within normal weight and without risk factors for diabetes. Testing should be considered at a younger age or be carried out more frequently if you are overweight and have at least 1 risk factor for diabetes.  Colorectal cancer can be detected and often prevented. Most routine colorectal cancer screening begins at the age of 67 and continues through age 25. However, your health care provider may recommend screening at an earlier age if you have risk factors for colon cancer. On a yearly basis, your health care provider may provide home test kits to check for hidden blood in the stool. Use of a small camera at the end of a tube to directly examine the colon (sigmoidoscopy or colonoscopy) can detect the earliest forms of colorectal cancer. Talk to your health care provider about this at age 29, when routine screening begins. Direct exam of the colon should be repeated every 5-10 years through age 30, unless early forms of precancerous polyps or small growths are found.  People who are at an increased risk for hepatitis B should be screened for this virus. You are considered at high risk for hepatitis B if:  You were born in a country where hepatitis B occurs often. Talk with your health care  provider about which countries  are considered high risk.  Your parents were born in a high-risk country and you have not received a shot to protect against hepatitis B (hepatitis B vaccine).  You have HIV or AIDS.  You use needles to inject street drugs.  You live with, or have sex with, someone who has hepatitis B.  You are a man who has sex with other men (MSM).  You get hemodialysis treatment.  You take certain medicines for conditions such as cancer, organ transplantation, and autoimmune conditions.  Hepatitis C blood testing is recommended for all people born from [birth year] through 1965 and any individual with known risks for hepatitis C.  Practice safe sex. Use condoms and avoid high-risk sexual practices to reduce the spread of sexually transmitted infections (STIs). STIs include gonorrhea, chlamydia, syphilis, trichomonas, herpes, HPV, and human immunodeficiency virus (HIV). Herpes, HIV, and HPV are viral illnesses that have no cure. They can result in disability, cancer, and death.  If you are at risk of being infected with HIV, it is recommended that you take a prescription medicine daily to prevent HIV infection. This is called preexposure prophylaxis (PrEP). You are considered at risk if:  You are a man who has sex with other men (MSM) and have other risk factors.  You are a heterosexual man, are sexually active, and are at increased risk for HIV infection.  You take drugs by injection.  You are sexually active with a partner who has HIV.  Talk with your health care provider about whether you are at high risk of being infected with HIV. If you choose to begin PrEP, you should first be tested for HIV. You should then be tested every 3 months for as long as you are taking PrEP.  A one-time screening for abdominal aortic aneurysm (AAA) and surgical repair of large AAAs by ultrasound are recommended for men ages 21 to 71 years who are current or former smokers.  Healthy men  should no longer receive prostate-specific antigen (PSA) blood tests as part of routine cancer screening. Talk with your health care provider about prostate cancer screening.  Testicular cancer screening is not recommended for adult males who have no symptoms. Screening includes self-exam, a health care provider exam, and other screening tests. Consult with your health care provider about any symptoms you have or any concerns you have about testicular cancer.  Use sunscreen. Apply sunscreen liberally and repeatedly throughout the day. You should seek shade when your shadow is shorter than you. Protect yourself by wearing long sleeves, pants, a wide-brimmed hat, and sunglasses year round, whenever you are outdoors.  Once a month, do a whole-body skin exam, using a mirror to look at the skin on your back. Tell your health care provider about new moles, moles that have irregular borders, moles that are larger than a pencil eraser, or moles that have changed in shape or color.  Stay current with required vaccines (immunizations).  Influenza vaccine. All adults should be immunized every year.  Tetanus, diphtheria, and acellular pertussis (Td, Tdap) vaccine. An adult who has not previously received Tdap or who does not know his vaccine status should receive 1 dose of Tdap. This initial dose should be followed by tetanus and diphtheria toxoids (Td) booster doses every 10 years. Adults with an unknown or incomplete history of completing a 3-dose immunization series with Td-containing vaccines should begin or complete a primary immunization series including a Tdap dose. Adults should receive a Td booster every 10 years.  Varicella vaccine.  An adult without evidence of immunity to varicella should receive 2 doses or a second dose if he has previously received 1 dose.  Human papillomavirus (HPV) vaccine. Males aged 82-21 years who have not received the vaccine previously should receive the 3-dose series. Males  aged 22-26 years may be immunized. Immunization is recommended through the age of 60 years for any male who has sex with males and did not get any or all doses earlier. Immunization is recommended for any person with an immunocompromised condition through the age of 27 years if he did not get any or all doses earlier. During the 3-dose series, the second dose should be obtained 4-8 weeks after the first dose. The third dose should be obtained 24 weeks after the first dose and 16 weeks after the second dose.  Zoster vaccine. One dose is recommended for adults aged 39 years or older unless certain conditions are present.  Measles, mumps, and rubella (MMR) vaccine. Adults born before 75 generally are considered immune to measles and mumps. Adults born in [birth year] or later should have 1 or more doses of MMR vaccine unless there is a contraindication to the vaccine or there is laboratory evidence of immunity to each of the three diseases. A routine second dose of MMR vaccine should be obtained at least 28 days after the first dose for students attending postsecondary schools, health care workers, or international travelers. People who received inactivated measles vaccine or an unknown type of measles vaccine during 1963-1967 should receive 2 doses of MMR vaccine. People who received inactivated mumps vaccine or an unknown type of mumps vaccine before 1979 and are at high risk for mumps infection should consider immunization with 2 doses of MMR vaccine. Unvaccinated health care workers born before [birth year] who lack laboratory evidence of measles, mumps, or rubella immunity or laboratory confirmation of disease should consider measles and mumps immunization with 2 doses of MMR vaccine or rubella immunization with 1 dose of MMR vaccine.  Pneumococcal 13-valent conjugate (PCV13) vaccine. When indicated, a person who is uncertain of his immunization history and has no record of immunization should receive the PCV13 vaccine.  An adult aged 11 years or older who has certain medical conditions and has not been previously immunized should receive 1 dose of PCV13 vaccine. This PCV13 should be followed with a dose of pneumococcal polysaccharide (PPSV23) vaccine. The PPSV23 vaccine dose should be obtained at least 8 weeks after the dose of PCV13 vaccine. An adult aged 107 years or older who has certain medical conditions and previously received 1 or more doses of PPSV23 vaccine should receive 1 dose of PCV13. The PCV13 vaccine dose should be obtained 1 or more years after the last PPSV23 vaccine dose.  Pneumococcal polysaccharide (PPSV23) vaccine. When PCV13 is also indicated, PCV13 should be obtained first. All adults aged 76 years and older should be immunized. An adult younger than age 5 years who has certain medical conditions should be immunized. Any person who resides in a nursing home or long-term care facility should be immunized. An adult smoker should be immunized. People with an immunocompromised condition and certain other conditions should receive both PCV13 and PPSV23 vaccines. People with human immunodeficiency virus (HIV) infection should be immunized as soon as possible after diagnosis. Immunization during chemotherapy or radiation therapy should be avoided. Routine use of PPSV23 vaccine is not recommended for American Indians, [place]atives, or people younger than 65 years unless there are medical conditions that require PPSV23 vaccine. When indicated,  people who have unknown immunization and have no record of immunization should receive PPSV23 vaccine. One-time revaccination 5 years after the first dose of PPSV23 is recommended for people aged 19-64 years who have chronic kidney failure, nephrotic syndrome, asplenia, or immunocompromised conditions. People who received 1-2 doses of PPSV23 before age 35 years should receive another dose of PPSV23 vaccine at age 64 years or later if at least 5 years have passed since the  previous dose. Doses of PPSV23 are not needed for people immunized with PPSV23 at or after age 28 years.  Meningococcal vaccine. Adults with asplenia or persistent complement component deficiencies should receive 2 doses of quadrivalent meningococcal conjugate (MenACWY-D) vaccine. The doses should be obtained at least 2 months apart. Microbiologists working with certain meningococcal bacteria, [employer] recruits, people at risk during an outbreak, and people who travel to or live in countries with a high rate of meningitis should be immunized. A first-year college student up through age 40 years who is living in a residence hall should receive a dose if he did not receive a dose on or after his 16th birthday. Adults who have certain high-risk conditions should receive one or more doses of vaccine.  Hepatitis A vaccine. Adults who wish to be protected from this disease, have certain high-risk conditions, work with hepatitis A-infected animals, work in hepatitis A research labs, or travel to or work in countries with a high rate of hepatitis A should be immunized. Adults who were previously unvaccinated and who anticipate close contact with an international adoptee during the first 60 days after arrival in the [place] from a country with a high rate of hepatitis A should be immunized.  Hepatitis B vaccine. Adults should be immunized if they wish to be protected from this disease, have certain high-risk conditions, may be exposed to blood or other infectious body fluids, are household contacts or sex partners of hepatitis B positive people, are clients or workers in certain care facilities, or travel to or work in countries with a high rate of hepatitis B.  Haemophilus influenzae type b (Hib) vaccine. A previously unvaccinated person with asplenia or sickle cell disease or having a scheduled splenectomy should receive 1 dose of Hib vaccine. Regardless of previous immunization, a recipient of a  hematopoietic stem cell transplant should receive a 3-dose series 6-12 months after his successful transplant. Hib vaccine is not recommended for adults with HIV infection. Preventive Service / Frequency Ages 83 to 44  Blood pressure check.** / Every 1 to 2 years.  Lipid and cholesterol check.** / Every 5 years beginning at age 93.  Hepatitis C blood test.** / For any individual with known risks for hepatitis C.  Skin self-exam. / Monthly.  Influenza vaccine. / Every year.  Tetanus, diphtheria, and acellular pertussis (Tdap, Td) vaccine.** / Consult your health care provider. 1 dose of Td every 10 years.  Varicella vaccine.** / Consult your health care provider.  HPV vaccine. / 3 doses over 6 months, if 83 or younger.  Measles, mumps, rubella (MMR) vaccine.** / You need at least 1 dose of MMR if you were born in 1955 or later. You may also need a second dose.  Pneumococcal 13-valent conjugate (PCV13) vaccine.** / Consult your health care provider.  Pneumococcal polysaccharide (PPSV23) vaccine.** / 1 to 2 doses if you smoke cigarettes or if you have certain conditions.  Meningococcal vaccine.** / 1 dose if you are age 52 to 36 years and a Market researcher living in  a residence hall, or have one of several medical conditions. You may also need additional booster doses.  Hepatitis A vaccine.** / Consult your health care provider.  Hepatitis B vaccine.** / Consult your health care provider.  Haemophilus influenzae type b (Hib) vaccine.** / Consult your health care provider. Ages 71 to 57  Blood pressure check.** / Every 1 to 2 years.  Lipid and cholesterol check.** / Every 5 years beginning at age 81.  Lung cancer screening. / Every year if you are aged 27-80 years and have a 30-pack-year history of smoking and currently smoke or have quit within the past 15 years. Yearly screening is stopped once you have quit smoking for at least 15 years or develop a health problem  that would prevent you from having lung cancer treatment.  Fecal occult blood test (FOBT) of stool. / Every year beginning at age 46 and continuing until age 36. You may not have to do this test if you get a colonoscopy every 10 years.  Flexible sigmoidoscopy** or colonoscopy.** / Every 5 years for a flexible sigmoidoscopy or every 10 years for a colonoscopy beginning at age 69 and continuing until age 57.  Hepatitis C blood test.** / For all people born from [birth year] through 1963 and any individual with known risks for hepatitis C.  Skin self-exam. / Monthly.  Influenza vaccine. / Every year.  Tetanus, diphtheria, and acellular pertussis (Tdap/Td) vaccine.** / Consult your health care provider. 1 dose of Td every 10 years.  Varicella vaccine.** / Consult your health care provider.  Zoster vaccine.** / 1 dose for adults aged 59 years or older.  Measles, mumps, rubella (MMR) vaccine.** / You need at least 1 dose of MMR if you were born in 1955 or later. You may also need a second dose.  Pneumococcal 13-valent conjugate (PCV13) vaccine.** / Consult your health care provider.  Pneumococcal polysaccharide (PPSV23) vaccine.** / 1 to 2 doses if you smoke cigarettes or if you have certain conditions.  Meningococcal vaccine.** / Consult your health care provider.  Hepatitis A vaccine.** / Consult your health care provider.  Hepatitis B vaccine.** / Consult your health care provider.  Haemophilus influenzae type b (Hib) vaccine.** / Consult your health care provider. Ages 3 and over  Blood pressure check.** / Every 1 to 2 years.  Lipid and cholesterol check.**/ Every 5 years beginning at age 72.  Lung cancer screening. / Every year if you are aged 43-80 years and have a 30-pack-year history of smoking and currently smoke or have quit within the past 15 years. Yearly screening is stopped once you have quit smoking for at least 15 years or develop a health problem that would prevent you from  having lung cancer treatment.  Fecal occult blood test (FOBT) of stool. / Every year beginning at age 2 and continuing until age 40. You may not have to do this test if you get a colonoscopy every 10 years.  Flexible sigmoidoscopy** or colonoscopy.** / Every 5 years for a flexible sigmoidoscopy or every 10 years for a colonoscopy beginning at age 33 and continuing until age 8.  Hepatitis C blood test.** / For all people born from [birth year] through 1963 and any individual with known risks for hepatitis C.  Abdominal aortic aneurysm (AAA) screening.** / A one-time screening for ages 48 to 23 years who are current or former smokers.  Skin self-exam. / Monthly.  Influenza vaccine. / Every year.  Tetanus, diphtheria, and acellular pertussis (Tdap/Td) vaccine.** / 1  dose of Td every 10 years.  Varicella vaccine.** / Consult your health care provider.  Zoster vaccine.** / 1 dose for adults aged 9 years or older.  Pneumococcal 13-valent conjugate (PCV13) vaccine.** / Consult your health care provider.  Pneumococcal polysaccharide (PPSV23) vaccine.** / 1 dose for all adults aged 74 years and older.  Meningococcal vaccine.** / Consult your health care provider.  Hepatitis A vaccine.** / Consult your health care provider.  Hepatitis B vaccine.** / Consult your health care provider.  Haemophilus influenzae type b (Hib) vaccine.** / Consult your health care provider. **Family history and personal history of risk and conditions may change your health care provider's recommendations. Document Released: 09/27/2001 Document Revised: 08/06/2013 Document Reviewed: 12/27/2010 [facility] Patient Information 2015 [place], [place]. This information is not intended to replace advice given to you by your health care provider. Make sure you discuss any questions you have with your health care provider.

## 2015-01-06 ENCOUNTER — Other Ambulatory Visit: Payer: Medicare Other

## 2015-01-13 ENCOUNTER — Telehealth: Payer: Self-pay | Admitting: Internal Medicine

## 2015-01-13 DIAGNOSIS — G894 Chronic pain syndrome: Secondary | ICD-10-CM

## 2015-01-13 DIAGNOSIS — D571 Sickle-cell disease without crisis: Secondary | ICD-10-CM

## 2015-01-13 MED ORDER — OXYCODONE-ACETAMINOPHEN 10-325 MG PO TABS: 1.0000 | ORAL_TABLET | ORAL | Status: DC | PRN

## 2015-01-13 NOTE — Telephone Encounter (Signed)
Refill request for percocet 10/325mg . LOV 01/01/2015 Please advise. Thanks!

## 2015-01-13 NOTE — Telephone Encounter (Signed)
Patient's last office visit was on 01/01/2015. Reviewed [place] Substance Reporting system prior to reorder, no inconsistencies noted. Patient to schedule follow up within 1 month.   Meds ordered this encounter  Medications  . oxyCODONE-acetaminophen (PERCOCET) 10-325 MG per tablet    Sig: Take 1 tablet by mouth every 4 (four) hours as needed for pain.    Dispense:  90 tablet    Refill:  0    Order Specific Question:  Supervising Provider    Answer:  Liston Alba A [[id number]    Dorena Dew, FNP

## 2015-01-16 ENCOUNTER — Other Ambulatory Visit: Payer: Self-pay | Admitting: Family Medicine

## 2015-01-16 ENCOUNTER — Other Ambulatory Visit: Payer: Medicare Other

## 2015-01-16 DIAGNOSIS — D571 Sickle-cell disease without crisis: Secondary | ICD-10-CM | POA: Diagnosis not present

## 2015-01-16 DIAGNOSIS — Z202 Contact with and (suspected) exposure to infections with a predominantly sexual mode of transmission: Secondary | ICD-10-CM

## 2015-01-16 LAB — CBC WITH DIFFERENTIAL/PLATELET
BASOS PCT: 1 % (ref 0–1)
Basophils Absolute: 0.1 10*3/uL (ref 0.0–0.1)
Eosinophils Absolute: 0.1 10*3/uL (ref 0.0–0.7)
Eosinophils Relative: 2 % (ref 0–5)
HCT: 28.7 % — ABNORMAL LOW (ref 39.0–52.0)
Hemoglobin: 10.5 g/dL — ABNORMAL LOW (ref 13.0–17.0)
LYMPHS PCT: 47 % — AB (ref 12–46)
Lymphs Abs: 3 10*3/uL (ref 0.7–4.0)
MCH: 34.5 pg — AB (ref 26.0–34.0)
MCHC: 36.6 g/dL — ABNORMAL HIGH (ref 30.0–36.0)
MCV: 94.4 fL (ref 78.0–100.0)
MONOS PCT: 8 % (ref 3–12)
MPV: 9.6 fL (ref 8.6–12.4)
Monocytes Absolute: 0.5 10*3/uL (ref 0.1–1.0)
Neutro Abs: 2.6 10*3/uL (ref 1.7–7.7)
Neutrophils Relative %: 42 % — ABNORMAL LOW (ref 43–77)
Platelets: 784 10*3/uL — ABNORMAL HIGH (ref 150–400)
RBC: 3.04 MIL/uL — AB (ref 4.22–5.81)
RDW: 17.4 % — ABNORMAL HIGH (ref 11.5–15.5)
WBC: 6.3 10*3/uL (ref 4.0–10.5)

## 2015-01-16 LAB — COMPREHENSIVE METABOLIC PANEL
ALT: 17 U/L (ref 0–53)
AST: 30 U/L (ref 0–37)
Albumin: 4.6 g/dL (ref 3.5–5.2)
Alkaline Phosphatase: 83 U/L (ref 39–117)
BUN: 7 mg/dL (ref 6–23)
CHLORIDE: 104 meq/L (ref 96–112)
CO2: 24 meq/L (ref 19–32)
CREATININE: 0.63 mg/dL (ref 0.50–1.35)
Calcium: 9.4 mg/dL (ref 8.4–10.5)
Glucose, Bld: 82 mg/dL (ref 70–99)
Potassium: 4.2 mEq/L (ref 3.5–5.3)
SODIUM: 140 meq/L (ref 135–145)
Total Bilirubin: 2.1 mg/dL — ABNORMAL HIGH (ref 0.2–1.2)
Total Protein: 8.3 g/dL (ref 6.0–8.3)

## 2015-01-30 ENCOUNTER — Ambulatory Visit: Payer: Medicare Other | Admitting: Family Medicine

## 2015-02-10 ENCOUNTER — Telehealth: Payer: Self-pay | Admitting: Family Medicine

## 2015-02-10 NOTE — Telephone Encounter (Signed)
He needs to come in tomorrow to see me before getting a refill. This is per lachina's last note.

## 2015-02-10 NOTE — Telephone Encounter (Signed)
Refill request for Percocet 10/325mg . LOV 01/01/2015. Please advise. Thanks!

## 2015-02-10 NOTE — Telephone Encounter (Signed)
Left message for patient to call to schedule a follow up appointment.

## 2015-02-11 ENCOUNTER — Ambulatory Visit (INDEPENDENT_AMBULATORY_CARE_PROVIDER_SITE_OTHER): Payer: Medicare Other | Admitting: Family Medicine

## 2015-02-11 VITALS — BP 115/53 | HR 62 | Temp 97.9°F | Resp 16 | Ht 73.0 in | Wt 146.0 lb

## 2015-02-11 DIAGNOSIS — D571 Sickle-cell disease without crisis: Secondary | ICD-10-CM | POA: Diagnosis not present

## 2015-02-11 DIAGNOSIS — Z0000 Encounter for general adult medical examination without abnormal findings: Secondary | ICD-10-CM

## 2015-02-11 DIAGNOSIS — G894 Chronic pain syndrome: Secondary | ICD-10-CM | POA: Diagnosis not present

## 2015-02-11 MED ORDER — OXYCODONE-ACETAMINOPHEN 10-325 MG PO TABS: 1.0000 | ORAL_TABLET | ORAL | Status: DC | PRN

## 2015-02-11 NOTE — Progress Notes (Signed)
Patient ID: [name], male   DOB: Oct 29, 1980, 34 y.o.   MRN: [id number]   Shafiq Larch, is a 34 y.o. male  [id number]  [id number]  DOB - [birth year]  CC:  Chief Complaint  Patient presents with  . Follow-up    scd       HPI: Jermiah Soderman is a 34 y.o. male here today to establish medical care. Patient has No headache, No chest pain, No abdominal pain - No Nausea, No new weakness tingling or numbness, No Cough - SOB.  No Known Allergies Past Medical History  Diagnosis Date  . Sickle cell anemia    Current Outpatient Prescriptions on File Prior to Visit  Medication Sig Dispense Refill  . cholecalciferol (VITAMIN D) 1000 UNITS tablet Take 1,000 Units by mouth daily.    . folic acid (FOLVITE) 443 MCG tablet Take 400 mcg by mouth daily.    . hydroxyurea (HYDREA) 500 MG capsule TAKE 2 CAPSULES BY MOUTH TWICE A DAY MAY TAKE WITH FOOD TO MINIMIZE GI SIDE EFFECTS 120 capsule 3  . ibuprofen (ADVIL,MOTRIN) 600 MG tablet Take 1 tablet (600 mg total) by mouth every 6 (six) hours as needed for mild pain or moderate pain. 30 tablet 0  . Omega-3 Fatty Acids (FISH OIL) 1000 MG CAPS Take by mouth 1 day or 1 dose.     No current facility-administered medications on file prior to visit.   Family History  Problem Relation Age of Onset  . Sickle cell anemia Mother   . Sickle cell trait Father   . Sickle cell trait Sister   . Sickle cell trait Brother   . Cancer Maternal Grandmother    History   Social History  . Marital Status: Single    Spouse Name: N/A  . Number of Children: N/A  . Years of Education: N/A   Occupational History  . Not on file.   Social History Main Topics  . Smoking status: Never Smoker   . Smokeless tobacco: Not on file  . Alcohol Use: No  . Drug Use: No  . Sexual Activity: Yes   Other Topics Concern  . Not on file   Social History Narrative    Review of Systems: Constitutional: Negative for fever, chills, appetite change, weight loss,   fatigue. HENT: Negative for ear pain, ear discharge.nose bleeds Eyes: Negative for pain, discharge, redness, itching and visual disturbance. Neck: Negative for pain, stiffness Respiratory: Negative for cough, shortness of breath,   Cardiovascular: Negative for chest pain, palpitations and leg swelling. Gastrointestinal: Negative for abdominal distention, abdominal pain, nausea, vomiting, diarrhea, constipations Genitourinary: Negative for dysuria, urgency, frequency, hematuria, flank pain,  Musculoskeletal: Negative for, joint pain, joint  swelling, arthralgia and gait problem.Negative for weakness.Positive for chronic back pain. Neurological: Negative for dizziness, tremors, seizures, syncope,   light-headedness, numbness and headaches.  Hematological: Negative for easy bruising or bleeding Psychiatric/Behavioral: Negative for depression, anxiety, decreased concentration, confusion   Objective:   Filed Vitals:   02/11/15 1319  BP: 115/53  Pulse: 62  Temp: 97.9 F (36.6 C)  Resp: 16    Physical Exam: Constitutional: Patient appears well-developed and well-nourished. No distress. HENT: Normocephalic, atraumatic, External right and left ear normal. Oropharynx is clear and moist.  Eyes: Conjunctivae and EOM are normal. PERRLA, no scleral icterus. Neck: Normal ROM. Neck supple. No lymphadenopathy, No thyromegaly. CVS: RRR, S1/S2 +, no murmurs, no gallops, no rubs Pulmonary: Effort and breath sounds normal, no stridor, rhonchi, wheezes, rales.  Abdominal: Soft. Normoactive  BS,, no distension, tenderness, rebound or guarding.  Musculoskeletal: Normal range of motion. No edema and no tenderness.  Neuro: Alert.Normal muscle tone coordination. Non-focal Skin: Skin is warm and dry. No rash noted. Not diaphoretic. No erythema. No pallor. Psychiatric: Normal mood and affect. Behavior, judgment, thought content normal.  Lab Results  Component Value Date   WBC 6.3 01/16/2015   HGB 10.5*  01/16/2015   HCT 28.7* 01/16/2015   MCV 94.4 01/16/2015   PLT 784* 01/16/2015   Lab Results  Component Value Date   CREATININE 0.63 01/16/2015   BUN 7 01/16/2015   NA 140 01/16/2015   K 4.2 01/16/2015   CL 104 01/16/2015   CO2 24 01/16/2015    No results found for: HGBA1C Lipid Panel  No results found for: CHOL, TRIG, HDL, CHOLHDL, VLDL, LDLCALC     Assessment and plan:     1. Hb-SS disease without crisis - Sickle cell disease - Continue Hydrea 1000 mg twice daily. We discussed the need for good hydration, monitoring of hydration status, avoidance of heat, cold, stress, and infection triggers. We discussed the risks and benefits of Hydrea, including bone marrow suppression, the possibility of GI upset, skin ulcers, hair thinning, and teratogenicity. The patient was reminded of the need to seek medical attention of any symptoms of bleeding, anemia, or infection. Continue folic acid 1 mg daily to prevent aplastic bone marrow crises.       - Eye - High risk of proliferative retinopathy. Pt due for Annual examination. Will refer.  - Immunization status - He will receive yearly influenza vaccination today and is up to date with Pneumococcal vaccine.   - Acute and chronic painful episodes - Per Dr. Aletha Halim will continue oxyCODONE-acetaminophen (PERCOCET) 10-325 MG per tablet; Take 1 tablet by mouth every 4 (four) hours as needed for pain. Dispense: 90 tablet; Refill: 0 We discussed that he is to receive his Schedule II prescriptions only from [place]. He is also aware that his prescription history is available to [place] online through the [place]. Controlled substance agreement signed (Date). We reminded (Pt) that all patients receiving Schedule II narcotics must be seen for follow up every three months. We reviewed the terms of our pain agreement, including the need to keep medicines in a safe locked location away from children or pets, and the need to report excess sedation or constipation,  measures to avoid constipation, and policies related      Micheline Chapman, FNP-BC

## 2015-02-11 NOTE — Patient Instructions (Signed)
Continue current medical treatment. Follow-up in 3 months unless you have problems. Follow-up here or in ED if needed for [facility] crisis/

## 2015-02-12 LAB — CBC WITH DIFFERENTIAL/PLATELET
BASOS PCT: 0 % (ref 0–1)
Basophils Absolute: 0 10*3/uL (ref 0.0–0.1)
EOS ABS: 0.1 10*3/uL (ref 0.0–0.7)
Eosinophils Relative: 2 % (ref 0–5)
HCT: 28.7 % — ABNORMAL LOW (ref 39.0–52.0)
Hemoglobin: 9.9 g/dL — ABNORMAL LOW (ref 13.0–17.0)
LYMPHS PCT: 35 % (ref 12–46)
Lymphs Abs: 2.5 10*3/uL (ref 0.7–4.0)
MCH: 32.6 pg (ref 26.0–34.0)
MCHC: 34.5 g/dL (ref 30.0–36.0)
MCV: 94.4 fL (ref 78.0–100.0)
MONO ABS: 0.4 10*3/uL (ref 0.1–1.0)
MPV: 8.8 fL (ref 8.6–12.4)
Monocytes Relative: 5 % (ref 3–12)
Neutro Abs: 4.1 10*3/uL (ref 1.7–7.7)
Neutrophils Relative %: 58 % (ref 43–77)
Platelets: 452 10*3/uL — ABNORMAL HIGH (ref 150–400)
RBC: 3.04 MIL/uL — ABNORMAL LOW (ref 4.22–5.81)
RDW: 19.1 % — AB (ref 11.5–15.5)
WBC: 7 10*3/uL (ref 4.0–10.5)

## 2015-02-12 LAB — COMPLETE METABOLIC PANEL WITH GFR
ALT: 12 U/L (ref 0–53)
AST: 21 U/L (ref 0–37)
Albumin: 4.5 g/dL (ref 3.5–5.2)
Alkaline Phosphatase: 75 U/L (ref 39–117)
BILIRUBIN TOTAL: 2.2 mg/dL — AB (ref 0.2–1.2)
BUN: 7 mg/dL (ref 6–23)
CALCIUM: 9.8 mg/dL (ref 8.4–10.5)
CHLORIDE: 102 meq/L (ref 96–112)
CO2: 31 meq/L (ref 19–32)
CREATININE: 0.7 mg/dL (ref 0.50–1.35)
Glucose, Bld: 93 mg/dL (ref 70–99)
Potassium: 4.7 mEq/L (ref 3.5–5.3)
Sodium: 140 mEq/L (ref 135–145)
Total Protein: 8.1 g/dL (ref 6.0–8.3)

## 2015-03-12 ENCOUNTER — Telehealth: Payer: Self-pay | Admitting: Family Medicine

## 2015-03-12 NOTE — Telephone Encounter (Signed)
Refill request for Percocet 10/325mg . LOV 02/11/2015 Please advise. Thanks!

## 2015-03-13 ENCOUNTER — Other Ambulatory Visit: Payer: Self-pay | Admitting: Family Medicine

## 2015-03-13 DIAGNOSIS — G894 Chronic pain syndrome: Secondary | ICD-10-CM

## 2015-03-13 DIAGNOSIS — D571 Sickle-cell disease without crisis: Secondary | ICD-10-CM

## 2015-03-13 MED ORDER — OXYCODONE-ACETAMINOPHEN 10-325 MG PO TABS: 1.0000 | ORAL_TABLET | ORAL | Status: DC | PRN

## 2015-04-07 ENCOUNTER — Telehealth: Payer: Self-pay | Admitting: Family Medicine

## 2015-04-07 ENCOUNTER — Other Ambulatory Visit: Payer: Self-pay | Admitting: Family Medicine

## 2015-04-07 NOTE — Telephone Encounter (Signed)
Refill request for Percocet 10/325mg . LOV 02/11/2015. Please advise. Thanks!

## 2015-04-14 ENCOUNTER — Other Ambulatory Visit: Payer: Self-pay | Admitting: Family Medicine

## 2015-04-14 DIAGNOSIS — D571 Sickle-cell disease without crisis: Secondary | ICD-10-CM

## 2015-04-14 DIAGNOSIS — G894 Chronic pain syndrome: Secondary | ICD-10-CM

## 2015-04-14 MED ORDER — OXYCODONE-ACETAMINOPHEN 10-325 MG PO TABS: 1.0000 | ORAL_TABLET | ORAL | Status: DC | PRN

## 2015-05-21 ENCOUNTER — Telehealth: Payer: Self-pay | Admitting: Family Medicine

## 2015-05-22 NOTE — Telephone Encounter (Signed)
Refill request for percocet 10/325mg . LOV 02/11/2015. Please advise. Thanks!

## 2015-05-25 ENCOUNTER — Other Ambulatory Visit: Payer: Self-pay | Admitting: Family Medicine

## 2015-05-25 DIAGNOSIS — G894 Chronic pain syndrome: Secondary | ICD-10-CM

## 2015-05-25 DIAGNOSIS — D571 Sickle-cell disease without crisis: Secondary | ICD-10-CM

## 2015-05-25 MED ORDER — OXYCODONE-ACETAMINOPHEN 10-325 MG PO TABS: 1.0000 | ORAL_TABLET | ORAL | Status: DC | PRN

## 2015-06-24 ENCOUNTER — Telehealth: Payer: Self-pay | Admitting: Internal Medicine

## 2015-06-24 NOTE — Telephone Encounter (Signed)
Patient will need an appointment, please call and schedule. Thanks!

## 2015-06-26 ENCOUNTER — Encounter: Payer: Self-pay | Admitting: Family Medicine

## 2015-06-26 ENCOUNTER — Ambulatory Visit (INDEPENDENT_AMBULATORY_CARE_PROVIDER_SITE_OTHER): Payer: Medicare Other | Admitting: Family Medicine

## 2015-06-26 VITALS — BP 132/79 | HR 71 | Temp 97.8°F | Resp 16 | Ht 73.0 in | Wt 149.0 lb

## 2015-06-26 DIAGNOSIS — Z23 Encounter for immunization: Secondary | ICD-10-CM | POA: Diagnosis not present

## 2015-06-26 DIAGNOSIS — G894 Chronic pain syndrome: Secondary | ICD-10-CM | POA: Diagnosis not present

## 2015-06-26 DIAGNOSIS — D571 Sickle-cell disease without crisis: Secondary | ICD-10-CM

## 2015-06-26 LAB — COMPLETE METABOLIC PANEL WITH GFR
ALT: 31 U/L (ref 9–46)
AST: 27 U/L (ref 10–40)
Albumin: 4.5 g/dL (ref 3.6–5.1)
Alkaline Phosphatase: 73 U/L (ref 40–115)
BILIRUBIN TOTAL: 1.8 mg/dL — AB (ref 0.2–1.2)
BUN: 9 mg/dL (ref 7–25)
CO2: 24 mmol/L (ref 20–31)
CREATININE: 0.67 mg/dL (ref 0.60–1.35)
Calcium: 9.4 mg/dL (ref 8.6–10.3)
Chloride: 102 mmol/L (ref 98–110)
GFR, Est African American: >89 mL/min (ref >=60)
GFR, Est Non African American: >89 mL/min (ref >=60)
GLUCOSE: 83 mg/dL (ref 65–99)
Potassium: 4.2 mmol/L (ref 3.5–5.3)
SODIUM: 137 mmol/L (ref 135–146)
TOTAL PROTEIN: 7.9 g/dL (ref 6.1–8.1)

## 2015-06-26 LAB — LACTATE DEHYDROGENASE: LDH: 352 U/L — ABNORMAL HIGH (ref 94–250)

## 2015-06-26 LAB — POCT URINALYSIS DIP (DEVICE)
BILIRUBIN URINE: NEGATIVE
GLUCOSE, UA: NEGATIVE mg/dL
Hgb urine dipstick: NEGATIVE
Ketones, ur: NEGATIVE mg/dL
Leukocytes, UA: NEGATIVE
NITRITE: NEGATIVE
Protein, ur: NEGATIVE mg/dL
Specific Gravity, Urine: 1.01 (ref 1.005–1.030)
Urobilinogen, UA: 0.2 mg/dL (ref 0.0–1.0)
pH: 5.5 (ref 5.0–8.0)

## 2015-06-26 MED ORDER — OXYCODONE-ACETAMINOPHEN 10-325 MG PO TABS: 1.0000 | ORAL_TABLET | ORAL | Status: DC | PRN

## 2015-06-26 NOTE — Progress Notes (Signed)
Subjective:    Patient ID: [name], male    DOB: 01-19-1981, 34 y.o.   MRN: [id number]  HPI [name], a 34 year old male with a history of sickle cell anemia, HbSS presents for a follow up of sickle cell anemia. [name] states that he feels well and is without current complaint. He is taking medications consistently.  He reports that he had a crisis several days ago in his right hip. He maintains that crisis resolved after rest, hydration, and opiate medications.  He denies fatigue, shortness of breath, chest pains, nausea, vomiting, diarrhea, or constipation. Past Medical History  Diagnosis Date  . Sickle cell anemia    Immunization History  Administered Date(s) Administered  . Influenza,inj,Quad PF,36+ Mos 07/03/2014  . Pneumococcal Conjugate-13 01/01/2015  . Tdap 08/26/2013  No Known Allergies Social History   Social History  . Marital Status: Single    Spouse Name: N/A  . Number of Children: N/A  . Years of Education: N/A   Occupational History  . Not on file.   Social History Main Topics  . Smoking status: Never Smoker   . Smokeless tobacco: Not on file  . Alcohol Use: No  . Drug Use: No  . Sexual Activity: Yes   Other Topics Concern  . Not on file   Social History Narrative   Review of Systems  Constitutional: Negative.  Negative for fatigue.  HENT: Negative.   Eyes: Negative.   Respiratory: Negative.   Cardiovascular: Negative.  Negative for chest pain, palpitations and leg swelling.  Gastrointestinal: Negative.   Endocrine: Negative for polydipsia, polyphagia and polyuria.  Musculoskeletal: Positive for myalgias.  Skin: Negative.   Allergic/Immunologic: Negative.   Neurological: Negative.  Negative for dizziness.  Hematological: Negative.   Psychiatric/Behavioral: Negative.        Objective:   Physical Exam  Constitutional: He is oriented to person, place, and time. He appears well-developed.  HENT:  Head: Normocephalic and  atraumatic.  Right Ear: External ear normal.  Left Ear: External ear normal.  Mouth/Throat: Oropharynx is clear and moist.  Eyes: Conjunctivae and EOM are normal. Pupils are equal, round, and reactive to light.  Neck: Normal range of motion. Neck supple.  Cardiovascular: Normal rate, regular rhythm, normal heart sounds and intact distal pulses.   Pulmonary/Chest: Effort normal and breath sounds normal.  Abdominal: Soft. Bowel sounds are normal.  Musculoskeletal: Normal range of motion.  Neurological: He is alert and oriented to person, place, and time. He has normal reflexes.  Skin: Skin is warm and dry.  Psychiatric: He has a normal mood and affect. His behavior is normal. Judgment and thought content normal.    BP 132/79 mmHg  Pulse 71  Temp(Src) 97.8 F (36.6 C) (Oral)  Resp 16  Ht 6\' 1"  (1.854 m)  Wt 149 lb (67.586 kg)  BMI 19.66 kg/m2    Assessment & Plan:  1. Hb-SS disease without crisis ([place]) Sickle cell disease - Continue Hydrea 1000 mg daily. Will review CBC w/differential.  We discussed the need for good hydration, monitoring of hydration status, avoidance of heat, cold, stress, and infection triggers. We discussed the risks and benefits of Hydrea, including bone marrow suppression, the possibility of GI upset, skin ulcers, hair thinning, and teratogenicity. The patient was reminded of the need to seek medical attention of any symptoms of bleeding, anemia, or infection. Continue folic acid 1 mg daily to prevent aplastic bone marrow crises.   Pulmonary evaluation - Patient denies  severe recurrent wheezes, shortness of breath with exercise, or persistent cough. If these symptoms develop, pulmonary function tests with spirometry will be ordered, and if abnormal, plan on referral to Pulmonology for further evaluation.  Cardiac - Routine screening for pulmonary hypertension is not recommended.  Eye - High risk of proliferative retinopathy. Annual eye exam with retinal exam  recommended to patient. Schedule a follow-up.   Immunization status - Patient refused influenza vaccination   Acute and chronic painful episodes - We agreed on current opiate dosage. We will not titrate medications as this time.  We discussed that pt is to receive his Schedule II prescriptions only from [place]. Pt is also aware that the prescription history is available to [place] online through the [facility] CSRS. Controlled substance agreement signed previously. We reminded [name] that all patients receiving Schedule II narcotics must be seen for follow within one month of prescription being requested. We reviewed the terms of our pain agreement, including the need to keep medicines in a safe locked location away from children or pets, and the need to report excess sedation or constipation, measures to avoid constipation, and policies related to early refills and stolen prescriptions. According to the [place] [facility] program, we have reviewed details related to analgesia, adverse effects, aberrant behaviors.  - POCT urinalysis dipstick - CBC with Differential - COMPLETE METABOLIC PANEL WITH GFR - Reticulocytes - Lactate Dehydrogenase - oxyCODONE-acetaminophen (PERCOCET) 10-325 MG tablet; Take 1 tablet by mouth every 4 (four) hours as needed for pain.  Dispense: 90 tablet; Refill: 0  2. Chronic pain syndrome  - oxyCODONE-acetaminophen (PERCOCET) 10-325 MG tablet; Take 1 tablet by mouth every 4 (four) hours as needed for pain.  Dispense: 90 tablet; Refill: 0  RTC: 1 month Alyss Granato M, FNP

## 2015-06-27 LAB — CBC WITH DIFFERENTIAL/PLATELET
BASOS PCT: 1 % (ref 0–1)
Basophils Absolute: 0.1 10*3/uL (ref 0.0–0.1)
Eosinophils Absolute: 0.3 10*3/uL (ref 0.0–0.7)
Eosinophils Relative: 3 % (ref 0–5)
HCT: 25 % — ABNORMAL LOW (ref 39.0–52.0)
Hemoglobin: 9 g/dL — ABNORMAL LOW (ref 13.0–17.0)
LYMPHS ABS: 3.8 10*3/uL (ref 0.7–4.0)
Lymphocytes Relative: 43 % (ref 12–46)
MCH: 31.4 pg (ref 26.0–34.0)
MCHC: 36 g/dL (ref 30.0–36.0)
MCV: 87.1 fL (ref 78.0–100.0)
MONO ABS: 0.8 10*3/uL (ref 0.1–1.0)
MONOS PCT: 9 % (ref 3–12)
MPV: 8.5 fL — ABNORMAL LOW (ref 8.6–12.4)
NEUTROS PCT: 44 % (ref 43–77)
Neutro Abs: 3.9 10*3/uL (ref 1.7–7.7)
PLATELETS: 328 10*3/uL (ref 150–400)
RBC: 2.87 MIL/uL — ABNORMAL LOW (ref 4.22–5.81)
RDW: 18.4 % — ABNORMAL HIGH (ref 11.5–15.5)
WBC: 8.9 10*3/uL (ref 4.0–10.5)

## 2015-06-27 LAB — RETICULOCYTES
ABS Retic: 206.6 10*3/uL — ABNORMAL HIGH (ref 19.0–186.0)
RBC.: 2.87 MIL/uL — ABNORMAL LOW (ref 4.22–5.81)
Retic Ct Pct: 7.2 % — ABNORMAL HIGH (ref 0.4–2.3)

## 2015-07-22 ENCOUNTER — Telehealth: Payer: Self-pay | Admitting: Family Medicine

## 2015-07-22 DIAGNOSIS — G894 Chronic pain syndrome: Secondary | ICD-10-CM

## 2015-07-22 DIAGNOSIS — D571 Sickle-cell disease without crisis: Secondary | ICD-10-CM

## 2015-07-22 NOTE — Telephone Encounter (Signed)
Refill request for Percocet 10/325mg  LOV 06/26/2015. Please advise. Thanks!

## 2015-07-24 ENCOUNTER — Ambulatory Visit (INDEPENDENT_AMBULATORY_CARE_PROVIDER_SITE_OTHER): Payer: Medicare Other | Admitting: Family Medicine

## 2015-07-24 ENCOUNTER — Encounter: Payer: Self-pay | Admitting: Family Medicine

## 2015-07-24 VITALS — BP 125/65 | HR 75 | Temp 98.0°F | Resp 14 | Ht 73.0 in | Wt 150.0 lb

## 2015-07-24 DIAGNOSIS — D571 Sickle-cell disease without crisis: Secondary | ICD-10-CM | POA: Diagnosis not present

## 2015-07-24 MED ORDER — OXYCODONE-ACETAMINOPHEN 10-325 MG PO TABS: 1.0000 | ORAL_TABLET | ORAL | Status: DC | PRN

## 2015-07-24 NOTE — Patient Instructions (Signed)
Sickle Cell Anemia, Adult Sickle cell anemia is a condition in which red blood cells have an abnormal "sickle" shape. This abnormal shape shortens the cells' life span, which results in a lower than normal concentration of red blood cells in the blood. The sickle shape also causes the cells to clump together and block free blood flow through the blood vessels. As a result, the tissues and organs of the body do not receive enough oxygen. Sickle cell anemia causes organ damage and pain and increases the risk of infection. CAUSES  Sickle cell anemia is a genetic disorder. Those who receive two copies of the gene have the condition, and those who receive one copy have the trait. RISK FACTORS The sickle cell gene is most common in people whose families originated in [place]. Other areas of the globe where sickle cell trait occurs include the Mediterranean, [place] and [place], the [place], and the [place].  SIGNS AND SYMPTOMS  Pain, especially in the extremities, back, chest, or abdomen (common). The pain may start suddenly or may develop following an illness, especially if there is dehydration. Pain can also occur due to overexertion or exposure to extreme temperature changes.  Frequent severe bacterial infections, especially certain types of pneumonia and meningitis.  Pain and swelling in the hands and feet.  Decreased activity.   Loss of appetite.   Change in behavior.  Headaches.  Seizures.  Shortness of breath or difficulty breathing.  Vision changes.  Skin ulcers. Those with the trait may not have symptoms or they may have mild symptoms.  DIAGNOSIS  Sickle cell anemia is diagnosed with blood tests that demonstrate the genetic trait. It is often diagnosed during the newborn period, due to mandatory testing nationwide. A variety of blood tests, X-rays, CT scans, MRI scans, ultrasounds, and lung function tests may also be done to monitor the condition. TREATMENT  Sickle  cell anemia may be treated with:  Medicines. You may be given pain medicines, antibiotic medicines (to treat and prevent infections) or medicines to increase the production of certain types of hemoglobin.  Fluids.  Oxygen.  Blood transfusions. HOME CARE INSTRUCTIONS   Drink enough fluid to keep your urine clear or pale yellow. Increase your fluid intake in hot weather and during exercise.  Do not smoke. Smoking lowers oxygen levels in the blood.   Only take over-the-counter or prescription medicines for pain, fever, or discomfort as directed by your health care provider.  Take antibiotics as directed by your health care provider. Make sure you finish them it even if you start to feel better.   Take supplements as directed by your health care provider.   Consider wearing a medical alert bracelet. This tells anyone caring for you in an emergency of your condition.   When traveling, keep your medical information, health care provider's names, and the medicines you take with you at all times.   If you develop a fever, do not take medicines to reduce the fever right away. This could cover up a problem that is developing. Notify your health care provider.  Keep all follow-up appointments with your health care provider. Sickle cell anemia requires regular medical care. SEEK MEDICAL CARE IF: You have a fever. SEEK IMMEDIATE MEDICAL CARE IF:   You feel dizzy or faint.   You have new abdominal pain, especially on the left side near the stomach area.   You develop a persistent, often uncomfortable and painful penile erection (priapism). If this is not treated immediately it   will lead to impotence.   You have numbness your arms or legs or you have a hard time moving them.   You have a hard time with speech.   You have a fever or persistent symptoms for more than 2-3 days.   You have a fever and your symptoms suddenly get worse.   You have signs or symptoms of infection.  These include:   Chills.   Abnormal tiredness (lethargy).   Irritability.   Poor eating.   Vomiting.   You develop pain that is not helped with medicine.   You develop shortness of breath.  You have pain in your chest.   You are coughing up pus-like or bloody sputum.   You develop a stiff neck.  Your feet or hands swell or have pain.  Your abdomen appears bloated.  You develop joint pain. MAKE SURE YOU:  Understand these instructions.   This information is not intended to replace advice given to you by your health care provider. Make sure you discuss any questions you have with your health care provider.   Document Released: 11/09/2005 Document Revised: 08/22/2014 Document Reviewed: 03/13/2013 Elsevier Interactive Patient Education 2016 [employer]  

## 2015-07-24 NOTE — Telephone Encounter (Signed)
Reviewed [place] Substance Reporting system prior to prescribing, no inconsistencies noted.   Meds ordered this encounter  Medications  . oxyCODONE-acetaminophen (PERCOCET) 10-325 MG tablet    Sig: Take 1 tablet by mouth every 4 (four) hours as needed for pain.    Dispense:  90 tablet    Refill:  0    Order Specific Question:  Supervising Provider    Answer:  Tresa Garter [id number]    Dorena Dew, FNP

## 2015-07-24 NOTE — Progress Notes (Signed)
Subjective:    Patient ID: [name], male    DOB: [birth year], 34 y.o.   MRN: [id number]  HPI [name], a 34 year old male with a history of sickle cell anemia, HbSS presents for a 1 month follow up of sickle cell anemia. [name] states that he feels well and is without current complaint. He is taking medications consistently.  He reports that he had a crisis several days ago in his right hip. His current pain intensity is 2/10. He maintains that crisis resolved after rest, hydration, and opiate medications. He denies fatigue, shortness of breath, chest pains, nausea, vomiting, diarrhea, or constipation. Past Medical History  Diagnosis Date  . Sickle cell anemia (HCC)    Immunization History  Administered Date(s) Administered  . Influenza,inj,Quad PF,36+ Mos 07/03/2014  . Pneumococcal Conjugate-13 01/01/2015  . Tdap 08/26/2013  No Known Allergies Social History   Social History  . Marital Status: Single    Spouse Name: N/A  . Number of Children: N/A  . Years of Education: N/A   Occupational History  . Not on file.   Social History Main Topics  . Smoking status: Never Smoker   . Smokeless tobacco: Not on file  . Alcohol Use: No  . Drug Use: No  . Sexual Activity: Yes   Other Topics Concern  . Not on file   Social History Narrative   Immunization History  Administered Date(s) Administered  . Influenza,inj,Quad PF,36+ Mos 07/03/2014  . Pneumococcal Conjugate-13 01/01/2015  . Tdap 08/26/2013   Review of Systems  Constitutional: Negative.  Negative for fatigue.  HENT: Negative.   Eyes: Negative.   Respiratory: Negative.   Cardiovascular: Negative.  Negative for chest pain, palpitations and leg swelling.  Gastrointestinal: Negative.   Endocrine: Negative for polydipsia, polyphagia and polyuria.  Musculoskeletal: Positive for myalgias.  Skin: Negative.   Allergic/Immunologic: Negative.   Neurological: Negative.  Negative for dizziness.   Hematological: Negative.   Psychiatric/Behavioral: Negative.        Objective:   Physical Exam  Constitutional: He is oriented to person, place, and time. He appears well-developed.  HENT:  Head: Normocephalic and atraumatic.  Right Ear: External ear normal.  Left Ear: External ear normal.  Mouth/Throat: Oropharynx is clear and moist.  Eyes: Conjunctivae and EOM are normal. Pupils are equal, round, and reactive to light.  Neck: Normal range of motion. Neck supple.  Cardiovascular: Normal rate, regular rhythm, normal heart sounds and intact distal pulses.   Pulmonary/Chest: Effort normal and breath sounds normal.  Abdominal: Soft. Bowel sounds are normal.  Musculoskeletal: Normal range of motion.  Neurological: He is alert and oriented to person, place, and time. He has normal reflexes.  Skin: Skin is warm and dry.  Psychiatric: He has a normal mood and affect. His behavior is normal. Judgment and thought content normal.    BP 125/65 mmHg  Pulse 75  Temp(Src) 98 F (36.7 C) (Oral)  Resp 14  Ht 6\' 1"  (1.854 m)  Wt 150 lb (68.04 kg)  BMI 19.79 kg/m2    Assessment & Plan:  1. Hb-SS disease without crisis ([place])  Acute and chronic painful episodes - We agreed on current opiate dosage. We will not titrate medications as this time.  We discussed that pt is to receive his Schedule II prescriptions only from [place]. Pt is also aware that the prescription history is available to [place] online through the [facility] CSRS. Controlled substance agreement signed previously. We reminded [name] that  all patients receiving Schedule II narcotics must be seen for follow within one month of prescription being requested. We reviewed the terms of our pain agreement, including the need to keep medicines in a safe locked location away from children or pets, and the need to report excess sedation or constipation, measures to avoid constipation, and policies related to early refills and stolen prescriptions. According  to the [place] Chronic [facility] program, we have reviewed details related to analgesia, adverse effects, aberrant behaviors.  RTC: 1 month for medication management.  The patient was given clear instructions to go to ER or return to medical center if symptoms do not improve, worsen or new problems develop. The patient verbalized understanding. Will notify patient with laboratory results. Dorena Dew, FNP

## 2015-08-18 ENCOUNTER — Telehealth: Payer: Self-pay | Admitting: Family Medicine

## 2015-08-18 DIAGNOSIS — D571 Sickle-cell disease without crisis: Secondary | ICD-10-CM

## 2015-08-18 DIAGNOSIS — G894 Chronic pain syndrome: Secondary | ICD-10-CM

## 2015-08-18 MED ORDER — OXYCODONE-ACETAMINOPHEN 10-325 MG PO TABS: 1.0000 | ORAL_TABLET | ORAL | Status: DC | PRN

## 2015-08-18 NOTE — Telephone Encounter (Signed)
Reviewed [place] Substance Reporting system prior to prescribing opiate medications, no inconsistencies noted.  Meds ordered this encounter  Medications  . oxyCODONE-acetaminophen (PERCOCET) 10-325 MG tablet    Sig: Take 1 tablet by mouth every 4 (four) hours as needed for pain.    Dispense:  90 tablet    Refill:  0    Order Specific Question:   Supervising Provider    Answer:   JEGEDE, OLUGBEMIGA E [[id number]]     Shandy Vi M, FNP 

## 2015-08-18 NOTE — Telephone Encounter (Signed)
Refill request for Percocet 10/325mg . LOV 07/24/2015. Please advise. Thanks!

## 2015-08-20 ENCOUNTER — Other Ambulatory Visit: Payer: Self-pay | Admitting: Family Medicine

## 2015-08-24 ENCOUNTER — Ambulatory Visit: Payer: Medicare Other | Admitting: Family Medicine

## 2015-09-08 ENCOUNTER — Encounter (HOSPITAL_COMMUNITY): Payer: Self-pay | Admitting: Emergency Medicine

## 2015-09-08 ENCOUNTER — Inpatient Hospital Stay (HOSPITAL_COMMUNITY): Payer: Medicare Other

## 2015-09-08 ENCOUNTER — Inpatient Hospital Stay (HOSPITAL_COMMUNITY)
Admission: EM | Admit: 2015-09-08 | Discharge: 2015-09-13 | DRG: 812 | Disposition: A | Discharge status: Home / Self Care | Payer: Medicare Other | Attending: Internal Medicine | Admitting: Internal Medicine

## 2015-09-08 DIAGNOSIS — R112 Nausea with vomiting, unspecified: Secondary | ICD-10-CM | POA: Diagnosis present

## 2015-09-08 DIAGNOSIS — Z79891 Long term (current) use of opiate analgesic: Secondary | ICD-10-CM

## 2015-09-08 DIAGNOSIS — D5700 Hb-SS disease with crisis, unspecified: Secondary | ICD-10-CM | POA: Diagnosis not present

## 2015-09-08 DIAGNOSIS — D72829 Elevated white blood cell count, unspecified: Secondary | ICD-10-CM | POA: Diagnosis not present

## 2015-09-08 DIAGNOSIS — D638 Anemia in other chronic diseases classified elsewhere: Secondary | ICD-10-CM | POA: Diagnosis present

## 2015-09-08 DIAGNOSIS — R519 Headache, unspecified: Secondary | ICD-10-CM

## 2015-09-08 DIAGNOSIS — Z79899 Other long term (current) drug therapy: Secondary | ICD-10-CM

## 2015-09-08 DIAGNOSIS — R51 Headache: Secondary | ICD-10-CM | POA: Diagnosis present

## 2015-09-08 DIAGNOSIS — D571 Sickle-cell disease without crisis: Secondary | ICD-10-CM | POA: Diagnosis present

## 2015-09-08 DIAGNOSIS — R0602 Shortness of breath: Secondary | ICD-10-CM | POA: Diagnosis not present

## 2015-09-08 DIAGNOSIS — E871 Hypo-osmolality and hyponatremia: Secondary | ICD-10-CM | POA: Diagnosis present

## 2015-09-08 DIAGNOSIS — R509 Fever, unspecified: Secondary | ICD-10-CM | POA: Diagnosis not present

## 2015-09-08 DIAGNOSIS — R079 Chest pain, unspecified: Secondary | ICD-10-CM

## 2015-09-08 LAB — CBC WITH DIFFERENTIAL/PLATELET
BASOS ABS: 0.1 10*3/uL (ref 0.0–0.1)
Basophils Relative: 1 %
EOS ABS: 0.1 10*3/uL (ref 0.0–0.7)
Eosinophils Relative: 1 %
HCT: 25.5 % — ABNORMAL LOW (ref 39.0–52.0)
Hemoglobin: 8.8 g/dL — ABNORMAL LOW (ref 13.0–17.0)
LYMPHS PCT: 23 %
Lymphs Abs: 2.2 10*3/uL (ref 0.7–4.0)
MCH: 30.3 pg (ref 26.0–34.0)
MCHC: 34.5 g/dL (ref 30.0–36.0)
MCV: 87.9 fL (ref 78.0–100.0)
MONO ABS: 0.7 10*3/uL (ref 0.1–1.0)
Monocytes Relative: 7 %
NEUTROS PCT: 68 %
Neutro Abs: 6.4 10*3/uL (ref 1.7–7.7)
Platelets: 357 10*3/uL (ref 150–400)
RBC: 2.9 MIL/uL — AB (ref 4.22–5.81)
RDW: 19.9 % — AB (ref 11.5–15.5)
WBC: 9.5 10*3/uL (ref 4.0–10.5)
nRBC: 5 /100 WBC — ABNORMAL HIGH

## 2015-09-08 LAB — COMPREHENSIVE METABOLIC PANEL
ALK PHOS: 93 U/L (ref 38–126)
ALT: 22 U/L (ref 17–63)
ANION GAP: 9 (ref 5–15)
AST: 37 U/L (ref 15–41)
Albumin: 4.7 g/dL (ref 3.5–5.0)
BILIRUBIN TOTAL: 1.8 mg/dL — AB (ref 0.3–1.2)
BUN: 8 mg/dL (ref 6–20)
CALCIUM: 10 mg/dL (ref 8.9–10.3)
CO2: 27 mmol/L (ref 22–32)
Chloride: 109 mmol/L (ref 101–111)
Creatinine, Ser: 0.69 mg/dL (ref 0.61–1.24)
Glucose, Bld: 104 mg/dL — ABNORMAL HIGH (ref 65–99)
Potassium: 5.1 mmol/L (ref 3.5–5.1)
Sodium: 145 mmol/L (ref 135–145)
TOTAL PROTEIN: 8.8 g/dL — AB (ref 6.5–8.1)

## 2015-09-08 LAB — TROPONIN I

## 2015-09-08 LAB — RETICULOCYTES
RBC.: 2.65 MIL/uL — ABNORMAL LOW (ref 4.22–5.81)
RETIC COUNT ABSOLUTE: 315.4 10*3/uL — AB (ref 19.0–186.0)
RETIC CT PCT: 11.9 % — AB (ref 0.4–3.1)

## 2015-09-08 LAB — TYPE AND SCREEN
ABO/RH(D): O POS
Antibody Screen: NEGATIVE

## 2015-09-08 LAB — LACTATE DEHYDROGENASE: LDH: 423 U/L — ABNORMAL HIGH (ref 98–192)

## 2015-09-08 LAB — I-STAT TROPONIN, ED: TROPONIN I, POC: 0 ng/mL (ref 0.00–0.08)

## 2015-09-08 LAB — MAGNESIUM: MAGNESIUM: 1.7 mg/dL (ref 1.7–2.4)

## 2015-09-08 MED ORDER — OXYCODONE-ACETAMINOPHEN 5-325 MG PO TABS
1.0000 | ORAL_TABLET | ORAL | Status: DC | PRN
  Administered 2015-09-08 – 2015-09-09 (×4): 1 via ORAL
  Filled 2015-09-08 (×4): qty 1

## 2015-09-08 MED ORDER — SODIUM CHLORIDE 0.9% FLUSH
9.0000 mL | INTRAVENOUS | Status: DC | PRN
  Filled 2015-09-08: qty 9

## 2015-09-08 MED ORDER — HYDROMORPHONE HCL 1 MG/ML IJ SOLN
1.0000 mg | Freq: Once | INTRAMUSCULAR | Status: AC
  Administered 2015-09-08: 1 mg via INTRAVENOUS
  Filled 2015-09-08: qty 1

## 2015-09-08 MED ORDER — SODIUM CHLORIDE 0.9 % IV SOLN
12.5000 mg | Freq: Four times a day (QID) | INTRAVENOUS | Status: DC | PRN
  Filled 2015-09-08: qty 0.25

## 2015-09-08 MED ORDER — PANTOPRAZOLE SODIUM 40 MG PO TBEC
40.0000 mg | DELAYED_RELEASE_TABLET | Freq: Every day | ORAL | Status: DC
  Administered 2015-09-08 – 2015-09-09 (×2): 40 mg via ORAL
  Filled 2015-09-08 (×2): qty 1

## 2015-09-08 MED ORDER — FOLIC ACID 0.5 MG HALF TAB
500.0000 ug | ORAL_TABLET | Freq: Every day | ORAL | Status: DC
  Filled 2015-09-08: qty 1

## 2015-09-08 MED ORDER — VITAMIN D 1000 UNITS PO TABS
1000.0000 [IU] | ORAL_TABLET | Freq: Every day | ORAL | Status: DC
  Administered 2015-09-08 – 2015-09-13 (×6): 1000 [IU] via ORAL
  Filled 2015-09-08 (×6): qty 1

## 2015-09-08 MED ORDER — SODIUM CHLORIDE 0.45 % IV SOLN
INTRAVENOUS | Status: DC
  Administered 2015-09-08 – 2015-09-09 (×3): via INTRAVENOUS
  Administered 2015-09-10 – 2015-09-11 (×3): 1000 mL via INTRAVENOUS

## 2015-09-08 MED ORDER — OXYCODONE HCL 5 MG PO TABS
5.0000 mg | ORAL_TABLET | ORAL | Status: DC | PRN
  Administered 2015-09-08 – 2015-09-09 (×4): 5 mg via ORAL
  Filled 2015-09-08 (×4): qty 1

## 2015-09-08 MED ORDER — PROMETHAZINE HCL 25 MG RE SUPP: 12.5000 mg | RECTAL | Status: DC | PRN

## 2015-09-08 MED ORDER — ACETAMINOPHEN 650 MG RE SUPP: 650.0000 mg | RECTAL | Status: DC | PRN

## 2015-09-08 MED ORDER — HYDROMORPHONE HCL 2 MG/ML IJ SOLN
2.0000 mg | Freq: Once | INTRAMUSCULAR | Status: AC
  Administered 2015-09-08: 2 mg via INTRAVENOUS
  Filled 2015-09-08: qty 1

## 2015-09-08 MED ORDER — HEPARIN SODIUM (PORCINE) 5000 UNIT/ML IJ SOLN
5000.0000 [IU] | Freq: Three times a day (TID) | INTRAMUSCULAR | Status: DC
  Administered 2015-09-08 – 2015-09-13 (×14): 5000 [IU] via SUBCUTANEOUS
  Filled 2015-09-08 (×14): qty 1

## 2015-09-08 MED ORDER — ONDANSETRON HCL 4 MG/2ML IJ SOLN
4.0000 mg | Freq: Once | INTRAMUSCULAR | Status: AC
  Administered 2015-09-08: 4 mg via INTRAVENOUS
  Filled 2015-09-08: qty 2

## 2015-09-08 MED ORDER — ACETAMINOPHEN 325 MG PO TABS: 650.0000 mg | ORAL_TABLET | ORAL | Status: DC | PRN

## 2015-09-08 MED ORDER — OXYCODONE-ACETAMINOPHEN 10-325 MG PO TABS: 1.0000 | ORAL_TABLET | ORAL | Status: DC | PRN

## 2015-09-08 MED ORDER — DIPHENHYDRAMINE HCL 12.5 MG/5ML PO ELIX: 12.5000 mg | ORAL_SOLUTION | Freq: Four times a day (QID) | ORAL | Status: DC | PRN

## 2015-09-08 MED ORDER — ONDANSETRON HCL 4 MG/2ML IJ SOLN
4.0000 mg | Freq: Four times a day (QID) | INTRAMUSCULAR | Status: DC | PRN
  Administered 2015-09-09 – 2015-09-10 (×2): 4 mg via INTRAVENOUS
  Filled 2015-09-08 (×2): qty 2

## 2015-09-08 MED ORDER — PANTOPRAZOLE SODIUM 40 MG IV SOLR: 40.0000 mg | Freq: Every day | INTRAVENOUS | Status: DC

## 2015-09-08 MED ORDER — FOLIC ACID 1 MG PO TABS
1.0000 mg | ORAL_TABLET | Freq: Every day | ORAL | Status: DC
  Administered 2015-09-08 – 2015-09-13 (×6): 1 mg via ORAL
  Filled 2015-09-08 (×6): qty 1

## 2015-09-08 MED ORDER — HYDROMORPHONE HCL 1 MG/ML IJ SOLN
1.0000 mg | Freq: Once | INTRAMUSCULAR | Status: AC
Start: 2015-09-08 — End: 2015-09-08
  Administered 2015-09-08: 1 mg via INTRAVENOUS
  Filled 2015-09-08: qty 1

## 2015-09-08 MED ORDER — SENNOSIDES-DOCUSATE SODIUM 8.6-50 MG PO TABS
1.0000 | ORAL_TABLET | Freq: Two times a day (BID) | ORAL | Status: DC
  Administered 2015-09-08 – 2015-09-13 (×10): 1 via ORAL
  Filled 2015-09-08 (×11): qty 1

## 2015-09-08 MED ORDER — SODIUM CHLORIDE 0.9 % IV BOLUS (SEPSIS)
1000.0000 mL | Freq: Once | INTRAVENOUS | Status: AC
  Administered 2015-09-08: 1000 mL via INTRAVENOUS

## 2015-09-08 MED ORDER — PROMETHAZINE HCL 25 MG PO TABS: 12.5000 mg | ORAL_TABLET | ORAL | Status: DC | PRN

## 2015-09-08 MED ORDER — KETOROLAC TROMETHAMINE 30 MG/ML IJ SOLN
30.0000 mg | Freq: Four times a day (QID) | INTRAMUSCULAR | Status: DC
  Administered 2015-09-09 – 2015-09-13 (×19): 30 mg via INTRAVENOUS
  Filled 2015-09-08 (×19): qty 1

## 2015-09-08 MED ORDER — HYDROMORPHONE 1 MG/ML IV SOLN
INTRAVENOUS | Status: DC
  Administered 2015-09-08: 19:00:00 via INTRAVENOUS
  Administered 2015-09-09: 4.9 mg via INTRAVENOUS
  Administered 2015-09-09: 7 mg via INTRAVENOUS
  Administered 2015-09-09: 8.4 mg via INTRAVENOUS
  Filled 2015-09-08: qty 25

## 2015-09-08 MED ORDER — NALOXONE HCL 0.4 MG/ML IJ SOLN: 0.4000 mg | INTRAMUSCULAR | Status: DC | PRN

## 2015-09-08 MED ORDER — HYDROXYUREA 500 MG PO CAPS
500.0000 mg | ORAL_CAPSULE | Freq: Two times a day (BID) | ORAL | Status: DC
  Administered 2015-09-08 – 2015-09-10 (×4): 500 mg via ORAL
  Filled 2015-09-08 (×4): qty 1

## 2015-09-08 MED ORDER — POLYETHYLENE GLYCOL 3350 17 G PO PACK: 17.0000 g | PACK | Freq: Every day | ORAL | Status: DC | PRN

## 2015-09-08 NOTE — Progress Notes (Signed)
Patient was a walk-in to the [facility].  During triage patient stated he was having chest pain that radiated down his left arm.  Lovett Sox, NP advised patient be taken to ED immediately.  Writer took patient to ED in wheelchair accompanied by his male friend.  Patient taken directly to registration counter.

## 2015-09-08 NOTE — ED Notes (Signed)
Pt continues to be in pain.  Admitting MD at bedside. Additional pain meds ordered.

## 2015-09-08 NOTE — ED Notes (Signed)
Pt states his chest and back have been hurting since 10:30 this morning, that it feels like his sickle cell pain. Appears to be in obvious pain in triage. Denies SOB, N/V/D

## 2015-09-08 NOTE — ED Notes (Signed)
Pt disconnected from PCA and PCA left in ED Rm. #10. Pt transported to MRI via NT. RN informed MRI Tech to contact this RN when pts MRI is completed to transport him upstairs. Pts Floor RN informed of pt situation. PCA and Pt will be transported following MRI completion.

## 2015-09-08 NOTE — Discharge Instructions (Signed)
Increase fluids.  Meds for pain.  Follow-up your doctor.

## 2015-09-08 NOTE — ED Provider Notes (Signed)
CSN: [id number]     Arrival date & time 09/08/15  1146 History   First MD Initiated Contact with Patient 09/08/15 1305     Chief Complaint  Patient presents with  . Sickle Cell Pain Crisis     (Consider location/radiation/quality/duration/timing/severity/associated sxs/prior Treatment) HPI.... Patient with known sickle cell anemia presents with rib, back, leg pain. He infrequently comes the emergency department. He has been taking oxycodone/acetaminophen 10/325 with minimal relief. No fever, sweats, chills, dysuria, dyspnea. Severity of pain is moderate.  Past Medical History  Diagnosis Date  . Sickle cell anemia [facility])    Past Surgical History  Procedure Laterality Date  . Cholecystectomy     Family History  Problem Relation Age of Onset  . Sickle cell anemia Mother   . Sickle cell trait Father   . Sickle cell trait Sister   . Sickle cell trait Brother   . Cancer Maternal Grandmother    Social History  Substance Use Topics  . Smoking status: Never Smoker   . Smokeless tobacco: None  . Alcohol Use: No    Review of Systems  All other systems reviewed and are negative.     Allergies  Review of patient's allergies indicates no known allergies.  Home Medications   Prior to Admission medications   Medication Sig Start Date End Date Taking? Authorizing Provider  cholecalciferol (VITAMIN D) 1000 UNITS tablet Take 1,000 Units by mouth daily.   Yes Historical Provider, MD  folic acid (FOLVITE) A999333 MCG tablet Take 400 mcg by mouth daily.   Yes Historical Provider, MD  hydroxyurea (HYDREA) 500 MG capsule TAKE 2 CAPSULES BY MOUTH TWICE A DAY MAY TAKE WITH FOOD TO MINIMIZE GI SIDE EFFECTS Patient taking differently: Take 2 capsules by mouth twice a day. May take with food to minimize GI side effects. 08/21/15  Yes Dorena Dew, FNP  oxyCODONE-acetaminophen (PERCOCET) 10-325 MG tablet Take 1 tablet by mouth every 4 (four) hours as needed for pain. 09/08/15   Nat Christen, MD    BP 120/73 mmHg  Pulse 68  Temp(Src) 97.6 F (36.4 C) (Oral)  Resp 17  SpO2 100% Physical Exam  Constitutional: He is oriented to person, place, and time. He appears well-developed and well-nourished.  HENT:  Head: Normocephalic and atraumatic.  Eyes: Conjunctivae and EOM are normal. Pupils are equal, round, and reactive to light.  Neck: Normal range of motion. Neck supple.  Cardiovascular: Normal rate and regular rhythm.   Pulmonary/Chest: Effort normal and breath sounds normal.  Abdominal: Soft. Bowel sounds are normal.  Musculoskeletal: Normal range of motion.  Neurological: He is alert and oriented to person, place, and time.  Skin: Skin is warm and dry.  Psychiatric: He has a normal mood and affect. His behavior is normal.  Nursing note and vitals reviewed.   ED Course  Procedures (including critical care time) Labs Review Labs Reviewed  COMPREHENSIVE METABOLIC PANEL - Abnormal; Notable for the following:    Glucose, Bld 104 (*)    Total Protein 8.8 (*)    Total Bilirubin 1.8 (*)    All other components within normal limits  CBC WITH DIFFERENTIAL/PLATELET - Abnormal; Notable for the following:    RBC 2.90 (*)    Hemoglobin 8.8 (*)    HCT 25.5 (*)    RDW 19.9 (*)    nRBC 5 (*)    All other components within normal limits  URINALYSIS, ROUTINE W REFLEX MICROSCOPIC (NOT AT [facility])  I-STAT TROPOININ, ED  TYPE AND  SCREEN    Imaging Review No results found. I have personally reviewed and evaluated these images and lab results as part of my medical decision-making.   EKG Interpretation   Date/Time:  Tuesday September 08 2015 13:08:48 EST Ventricular Rate:  69 PR Interval:  160 QRS Duration: 103 QT Interval:  414 QTC Calculation: 443 R Axis:   77 Text Interpretation:  Sinus arrhythmia Confirmed by Nargis Abrams  MD, Jenevie Casstevens  [phone]) on 09/08/2015 1:35:19 PM      MDM   Final diagnoses:  Sickle cell crisis ([place])    Will hydrate and treat pain. Hemoglobin stable.  Discussed with Dr. Laneta Simmers.    Nat Christen, MD 09/08/15 [phone]

## 2015-09-08 NOTE — ED Notes (Signed)
Patient moving too much from pain to obtain clear EKG, will give MD what we've obtained at this time. Will try again after pain management.

## 2015-09-08 NOTE — H&P (Signed)
[facility] History and Physical  Orville Greth [id number] DOB: 05/18/1981 DOA: 09/08/2015  Referring physician: Dr Laneta Simmers PCP: MATTHEWS,MICHELLE A., MD   Chief Complaint: Back pain, headaches, arm pain.  HPI: [name] is a 35 y.o. male history of sickle cell anemia, HbSS, last admission for crisis was March 2016, he presents complaining of back pain, arms pain, headaches. Pain is sharp, started this am. He is in severe pain, now pain 10/ 10. He has received 1 mg IV dilaudid almost every hour without any improvement. He report mild chest pain,is more coming from his back. He denies fever, weakness, speech problem.   Evaluation in the ED; WBC at 9.5, Hb at 8.8, Bilirubin 1.8, troponin 0.0  Review of Systems:  Negative, except as per HPI  Past Medical History  Diagnosis Date  . Sickle cell anemia [facility])    Past Surgical History  Procedure Laterality Date  . Cholecystectomy     Social History:  reports that he has never smoked. He does not have any smokeless tobacco history on file. He reports that he does not drink alcohol or use illicit drugs.  No Known Allergies  Family History  Problem Relation Age of Onset  . Sickle cell anemia Mother   . Sickle cell trait Father   . Sickle cell trait Sister   . Sickle cell trait Brother   . Cancer Maternal Grandmother     Prior to Admission medications   Medication Sig Start Date End Date Taking? Authorizing Provider  cholecalciferol (VITAMIN D) 1000 UNITS tablet Take 1,000 Units by mouth daily.   Yes Historical Provider, MD  folic acid (FOLVITE) A999333 MCG tablet Take 400 mcg by mouth daily.   Yes Historical Provider, MD  hydroxyurea (HYDREA) 500 MG capsule TAKE 2 CAPSULES BY MOUTH TWICE A DAY MAY TAKE WITH FOOD TO MINIMIZE GI SIDE EFFECTS Patient taking differently: Take 2 capsules by mouth twice a day. May take with food to minimize GI side effects. 08/21/15  Yes Dorena Dew, FNP  oxyCODONE-acetaminophen (PERCOCET)  10-325 MG tablet Take 1 tablet by mouth every 4 (four) hours as needed for pain. 09/08/15   Nat Christen, MD   Physical Exam: Filed Vitals:   09/08/15 1157 09/08/15 1316 09/08/15 1438 09/08/15 1634  BP: 127/64 120/87 120/73 132/73  Pulse: 87 67 68 86  Temp: 97.6 F (36.4 C)  97.6 F (36.4 C)   TempSrc: Oral  Oral   Resp: 21 18 17 16   SpO2: 100% 98% 100% 95%    Wt Readings from Last 3 Encounters:  07/24/15 68.04 kg (150 lb)  06/26/15 67.586 kg (149 lb)  02/11/15 66.225 kg (146 lb)    General:  Patient is lying in fetal position in severe pain  Eyes: PERRL, normal lids, irises & conjunctiva ENT: grossly normal hearing, lips & tongue Neck: no LAD, masses or thyromegaly Cardiovascular: RRR, no m/r/g. No LE edema. Telemetry: SR, no arrhythmias  Respiratory: CTA bilaterally, no w/r/r. Normal respiratory effort. Abdomen: soft, ntnd Skin: no rash or induration seen on limited exam Musculoskeletal: grossly normal tone BUE/BLE Psychiatric: grossly normal mood and affect, speech fluent and appropriate Neurologic: grossly non-focal. Limitation of movement left arm due to pain           Labs on Admission:  Basic Metabolic Panel:  Recent Labs Lab 09/08/15 1323  NA 145  K 5.1  CL 109  CO2 27  GLUCOSE 104*  BUN 8  CREATININE 0.69  CALCIUM 10.0   Liver  Function Tests:  Recent Labs Lab 09/08/15 1323  AST 37  ALT 22  ALKPHOS 93  BILITOT 1.8*  PROT 8.8*  ALBUMIN 4.7   No results for input(s): LIPASE, AMYLASE in the last 168 hours. No results for input(s): AMMONIA in the last 168 hours. CBC:  Recent Labs Lab 09/08/15 1323  WBC 9.5  NEUTROABS 6.4  HGB 8.8*  HCT 25.5*  MCV 87.9  PLT 357   Cardiac Enzymes: No results for input(s): CKTOTAL, CKMB, CKMBINDEX, TROPONINI in the last 168 hours.  BNP (last 3 results) No results for input(s): BNP in the last 8760 hours.  ProBNP (last 3 results) No results for input(s): PROBNP in the last 8760 hours.  CBG: No  results for input(s): GLUCAP in the last 168 hours.  Radiological Exams on Admission: No results found.  EKG: Independently reviewed. Sinus rhythm.   Assessment/Plan Active Problems:   Sickle cell pain crisis ([place])  1-Sickle cell pain crisis;  Admit to telemetry, cycle enzymes. Check Chest x ray.  IV fluids,Half NS.  PCA pump , will use prior dilaudid PCA setting,adjust as needed for pain control.  IV toradol  Check reticulocytes.  Continue with home dose percocet Hydroxyurea 500 mg BID>   2-Headache; neuro exam non focal. Will order MRI. Further tx depending on results.    Code Status: Full Code.  DVT Prophylaxis: lovenox Family Communication: care discussed  With patient.  Disposition Plan: expect 3 to 4 days   Time spent:75 minutes.   Niel Hummer A [facility] Pager [phone]

## 2015-09-08 NOTE — ED Notes (Signed)
Patient transported to MRI 

## 2015-09-08 NOTE — ED Provider Notes (Signed)
Handoff received from Dr. Lacinda Axon at 1600 with plan for reassessment with next round of pain meds.   Results:  BP 132/73 mmHg  Pulse 86  Temp(Src) 97.6 F (36.4 C) (Oral)  Resp 16  SpO2 95%  Results for orders placed or performed during the hospital encounter of 09/08/15  Comprehensive metabolic panel  Result Value Ref Range   Sodium 145 135 - 145 mmol/L   Potassium 5.1 3.5 - 5.1 mmol/L   Chloride 109 101 - 111 mmol/L   CO2 27 22 - 32 mmol/L   Glucose, Bld 104 (H) 65 - 99 mg/dL   BUN 8 6 - 20 mg/dL   Creatinine, Ser 0.69 0.61 - 1.24 mg/dL   Calcium 10.0 8.9 - 10.3 mg/dL   Total Protein 8.8 (H) 6.5 - 8.1 g/dL   Albumin 4.7 3.5 - 5.0 g/dL   AST 37 15 - 41 U/L   ALT 22 17 - 63 U/L   Alkaline Phosphatase 93 38 - 126 U/L   Total Bilirubin 1.8 (H) 0.3 - 1.2 mg/dL   GFR calc non Af Amer >60 >60 mL/min   GFR calc Af Amer >60 >60 mL/min   Anion gap 9 5 - 15  CBC with Differential  Result Value Ref Range   WBC 9.5 4.0 - 10.5 K/uL   RBC 2.90 (L) 4.22 - 5.81 MIL/uL   Hemoglobin 8.8 (L) 13.0 - 17.0 g/dL   HCT 25.5 (L) 39.0 - 52.0 %   MCV 87.9 78.0 - 100.0 fL   MCH 30.3 26.0 - 34.0 pg   MCHC 34.5 30.0 - 36.0 g/dL   RDW 19.9 (H) 11.5 - 15.5 %   Platelets 357 150 - 400 K/uL   Neutrophils Relative % 68 %   Lymphocytes Relative 23 %   Monocytes Relative 7 %   Eosinophils Relative 1 %   Basophils Relative 1 %   nRBC 5 (H) 0 /100 WBC   Neutro Abs 6.4 1.7 - 7.7 K/uL   Lymphs Abs 2.2 0.7 - 4.0 K/uL   Monocytes Absolute 0.7 0.1 - 1.0 K/uL   Eosinophils Absolute 0.1 0.0 - 0.7 K/uL   Basophils Absolute 0.1 0.0 - 0.1 K/uL   RBC Morphology RARE NRBCs    WBC Morphology WHITE COUNT CONFIRMED ON SMEAR    Smear Review PLATELET COUNT CONFIRMED BY SMEAR   I-stat troponin, ED (not at [facility])  Result Value Ref Range   Troponin i, poc 0.00 0.00 - 0.08 ng/mL   Comment 3          Type and screen [place]  Result Value Ref Range   ABO/RH(D) O POS    Antibody Screen NEG     Sample Expiration 09/11/2015     No results found.  Diagnoses that have been ruled out:  None  Diagnoses that are still under consideration:  None  Final diagnoses:  Sickle cell crisis ([name])   After discussion with patient he is not improving. No shortness of breath, no fevers, no signs of acute chest/stroke and Pt otherwise in NAD objectively other than complaint of pain. CBC, Retic count to assess for aplastic crisis is noncontributory. Hospitalist was consulted for admission and will see the patient in the emergency department.    Leo Grosser, MD 09/08/15 [phone]

## 2015-09-09 ENCOUNTER — Encounter: Payer: Self-pay | Admitting: Internal Medicine

## 2015-09-09 DIAGNOSIS — D5700 Hb-SS disease with crisis, unspecified: Principal | ICD-10-CM

## 2015-09-09 DIAGNOSIS — D72829 Elevated white blood cell count, unspecified: Secondary | ICD-10-CM

## 2015-09-09 DIAGNOSIS — D638 Anemia in other chronic diseases classified elsewhere: Secondary | ICD-10-CM

## 2015-09-09 LAB — TROPONIN I
Troponin I: <0.03 ng/mL
Troponin I: <0.03 ng/mL

## 2015-09-09 LAB — CBC WITH DIFFERENTIAL/PLATELET
Basophils Absolute: 0 10*3/uL (ref 0.0–0.1)
Basophils Relative: 0 %
EOS ABS: 0 10*3/uL (ref 0.0–0.7)
Eosinophils Relative: 0 %
HCT: 22 % — ABNORMAL LOW (ref 39.0–52.0)
HEMOGLOBIN: 7.8 g/dL — AB (ref 13.0–17.0)
LYMPHS PCT: 12 %
Lymphs Abs: 1.9 10*3/uL (ref 0.7–4.0)
MCH: 31 pg (ref 26.0–34.0)
MCHC: 35.5 g/dL (ref 30.0–36.0)
MCV: 87.3 fL (ref 78.0–100.0)
MONOS PCT: 10 %
Monocytes Absolute: 1.6 10*3/uL — ABNORMAL HIGH (ref 0.1–1.0)
NRBC: 7 /100{WBCs} — AB
Neutro Abs: 12.3 10*3/uL — ABNORMAL HIGH (ref 1.7–7.7)
Neutrophils Relative %: 78 %
Platelets: 273 10*3/uL (ref 150–400)
RBC: 2.52 MIL/uL — ABNORMAL LOW (ref 4.22–5.81)
RDW: 20 % — ABNORMAL HIGH (ref 11.5–15.5)
WBC: 15.8 10*3/uL — ABNORMAL HIGH (ref 4.0–10.5)

## 2015-09-09 LAB — URINALYSIS, ROUTINE W REFLEX MICROSCOPIC
Bilirubin Urine: NEGATIVE
GLUCOSE, UA: NEGATIVE mg/dL
Hgb urine dipstick: NEGATIVE
KETONES UR: NEGATIVE mg/dL
LEUKOCYTES UA: NEGATIVE
Nitrite: NEGATIVE
PH: 6.5 (ref 5.0–8.0)
Protein, ur: NEGATIVE mg/dL
Specific Gravity, Urine: 1.01 (ref 1.005–1.030)

## 2015-09-09 MED ORDER — DIPHENHYDRAMINE HCL 25 MG PO CAPS: 25.0000 mg | ORAL_CAPSULE | ORAL | Status: DC | PRN

## 2015-09-09 MED ORDER — HYDROMORPHONE HCL 2 MG/ML IJ SOLN
2.0000 mg | INTRAMUSCULAR | Status: DC | PRN
  Administered 2015-09-09 – 2015-09-10 (×4): 2 mg via INTRAVENOUS
  Filled 2015-09-09 (×3): qty 1

## 2015-09-09 MED ORDER — HYDROMORPHONE HCL 2 MG/ML IJ SOLN
2.0000 mg | INTRAMUSCULAR | Status: AC
  Administered 2015-09-09 (×5): 2 mg via INTRAVENOUS
  Filled 2015-09-09 (×6): qty 1

## 2015-09-09 MED ORDER — SODIUM CHLORIDE 0.9 % IV SOLN: 25.0000 mg | INTRAVENOUS | Status: DC | PRN

## 2015-09-09 MED ORDER — HYDROMORPHONE 1 MG/ML IV SOLN
INTRAVENOUS | Status: DC
  Administered 2015-09-09: 4.5 mg via INTRAVENOUS
  Administered 2015-09-09: 2.5 mg via INTRAVENOUS
  Administered 2015-09-10: 02:00:00 via INTRAVENOUS
  Administered 2015-09-10: 5 mg via INTRAVENOUS
  Administered 2015-09-10: 12.6 mg via INTRAVENOUS
  Administered 2015-09-10: 5 mg via INTRAVENOUS
  Administered 2015-09-10 (×2): 4 mg via INTRAVENOUS
  Filled 2015-09-09: qty 25

## 2015-09-09 NOTE — Progress Notes (Addendum)
Attempted to call nurse on 3W to give report.  2nd attempt to call report at 1513.  Third attempt to call report at 1527.

## 2015-09-09 NOTE — Progress Notes (Signed)
Report called to [facility], pt to transfer to 1335

## 2015-09-09 NOTE — Progress Notes (Signed)
Patient transferred from 4th floor. Alert and oriented x4. In bed resting. Encouraged to use dilaudid PCA for pain, verbalized understanding. Will continue to monitor.

## 2015-09-09 NOTE — Progress Notes (Signed)
[place] PROGRESS NOTE  [name] [id number] DOB: 02-Mar-1981 DOA: 09/08/2015 PCP: Christean Silvestri A., MD  Assessment/Plan: Active Problems:   Sickle cell anemia (HCC)   Sickle cell pain crisis ([place])  1. Hb SS with crisis: Pt is an opiate tolerant patient who is rarely hospitalized and who rarely seeks acute medical care is admitted with acute sickle cell crisis. He reports that his pain is still at 10/10 and localized to head, back and chest. He is falling asleep after each PCA use. Will decrease PCA bolus dose to 0.5 mg and schedule clinician assisited doses. Continue Toradol and IVF.  2. Leukocytosis: No evidence of infection. Likely related to crisis. Will re- evaluate once treated. 3. Anemia of chronic disease: Hb currently at baseline.    Code Status: Full Code Family Communication: N/A Disposition Plan: Not yet ready for discharge  [place].  Pager [phone]. If 7PM-7AM, please contact night-coverage.  09/09/2015, 11:25 AM  LOS: 1 day    Consultants:  None  Procedures:  None  Antibiotics:  None    Objective: Filed Vitals:   09/09/15 0000 09/09/15 0400 09/09/15 0515 09/09/15 0747  BP:   115/59   Pulse:   83   Temp:   97.8 F (36.6 C)   TempSrc:   Oral   Resp: 21 21 22 18   Height:      Weight:      SpO2: 99% 97% 97% 98%   Weight change:   Intake/Output Summary (Last 24 hours) at 09/09/15 1125 Last data filed at 09/09/15 0700  Gross per 24 hour  Intake 968.33 ml  Output   1475 ml  Net -506.67 ml    General: Alert, awake, oriented x3, in moderate distress.  HEENT: [place]/AT PEERL, EOMI, anicteric Neck: Trachea midline,  no masses, no thyromegal,y no JVD, no carotid bruit OROPHARYNX:  Moist, No exudate/ erythema/lesions.  Heart: Regular rate and rhythm, without murmurs, rubs, gallops, PMI non-displaced, no heaves or thrills on palpation.  Lungs: Clear to auscultation, no wheezing or rhonchi noted. No increased vocal fremitus  resonant to percussion  Abdomen: Soft, nontender, nondistended, positive bowel sounds, no masses no hepatosplenomegaly noted..  Neuro: No focal neurological deficits noted cranial nerves II through XII grossly intact.  Strength at functional baseline in bilateral upper and lower extremities. Musculoskeletal: No warm swelling or erythema around joints, no spinal tenderness noted. Psychiatric: Patient alert and oriented x3, good insight and cognition, good recent to remote recall.    Data Reviewed: Basic Metabolic Panel:  Recent Labs Lab 09/08/15 1323 09/08/15 2200  NA 145  --   K 5.1  --   CL 109  --   CO2 27  --   GLUCOSE 104*  --   BUN 8  --   CREATININE 0.69  --   CALCIUM 10.0  --   MG  --  1.7   Liver Function Tests:  Recent Labs Lab 09/08/15 1323  AST 37  ALT 22  ALKPHOS 93  BILITOT 1.8*  PROT 8.8*  ALBUMIN 4.7   No results for input(s): LIPASE, AMYLASE in the last 168 hours. No results for input(s): AMMONIA in the last 168 hours. CBC:  Recent Labs Lab 09/08/15 1323 09/09/15 0310  WBC 9.5 15.8*  NEUTROABS 6.4 12.3*  HGB 8.8* 7.8*  HCT 25.5* 22.0*  MCV 87.9 87.3  PLT 357 273   Cardiac Enzymes:  Recent Labs Lab 09/08/15 2200 09/09/15 0310 09/09/15 0905  TROPONINI <0.03 <0.03 <0.03   BNP (  last 3 results) No results for input(s): BNP in the last 8760 hours.  ProBNP (last 3 results) No results for input(s): PROBNP in the last 8760 hours.  CBG: No results for input(s): GLUCAP in the last 168 hours.  No results found for this or any previous visit (from the past 240 hour(s)).   Studies: [name] Contrast  09/08/2015  CLINICAL DATA:  Initial evaluation for acute headache. History of sickle cell anemia. EXAM: MRI HEAD WITHOUT CONTRAST TECHNIQUE: Multiplanar, multiecho pulse sequences of the brain and surrounding structures were obtained without intravenous contrast. COMPARISON:  None. FINDINGS: Cerebral volume within normal limits for patient  age. No significant white matter disease present. No foci of restricted diffusion to suggest acute infarct. Gray-white matter differentiation well maintained. Major intracranial vascular flow voids are well preserved. No acute or chronic intracranial hemorrhage. No areas of chronic infarction. No mass lesion, midline shift, or mass effect. No hydrocephalus. No extra-axial fluid collection. Major dural sinuses are patent. 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 Visualized upper cervical spine within normal limits. Pituitary gland normal.  No acute abnormality about the orbits. Few scattered retention cyst noted within the maxillary sinuses. Mild mucosal thickening within the ethmoidal air cells and maxillary sinuses. No air-fluid levels to suggest active sinus infection. No mastoid effusion. Inner ear structures normal. Mildly decreased T1 signal intensity throughout the visualized bone marrow, likely related to underlying history of sickle cell anemia. Visualized osseous structures otherwise unremarkable. No scalp soft tissue abnormality. IMPRESSION: 1. No acute intracranial process. 2. 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 Otherwise normal MRI of the brain. 3. Mild diffuse decrease in T1 signal intensity throughout the visualized bone marrow, likely related to underlying history of sickle cell anemia. Electronically Signed   By: Jeannine Boga M.D.   On: 09/08/2015 21:23   Dg Chest Port 1 View  09/08/2015  CLINICAL DATA:  Severe headaches, body aches, and fever since this morning. History of sickle cell disease. EXAM: PORTABLE CHEST 1 VIEW COMPARISON:  10/12/2014 FINDINGS: The heart size and mediastinal contours are within normal limits. Both lungs are clear. The visualized skeletal structures are unremarkable. IMPRESSION: No active disease. Electronically Signed   By: Lucienne Capers M.D.   On:  09/08/2015 21:41    Scheduled Meds: . cholecalciferol  1,000 Units Oral Daily  . folic acid  1 mg Oral Daily  . heparin  5,000 Units Subcutaneous 3 times per day  . HYDROmorphone   Intravenous 6 times per day  .  HYDROmorphone (DILAUDID) injection  2 mg Intravenous Q2H  . hydroxyurea  500 mg Oral BID  . ketorolac  30 mg Intravenous 4 times per day  . senna-docusate  1 tablet Oral BID   Continuous Infusions: . sodium chloride 100 mL/hr at 09/09/15 0545    Time spent 35 minutes

## 2015-09-10 MED ORDER — HYDROMORPHONE 1 MG/ML IV SOLN
INTRAVENOUS | Status: DC
  Administered 2015-09-10: 8.16 mg via INTRAVENOUS
  Administered 2015-09-10: 3.3 mg via INTRAVENOUS
  Administered 2015-09-11: 4.12 mg via INTRAVENOUS
  Administered 2015-09-11: 7.92 mg via INTRAVENOUS
  Administered 2015-09-11: 01:00:00 via INTRAVENOUS
  Filled 2015-09-10: qty 25

## 2015-09-10 MED ORDER — HYDROXYUREA 500 MG PO CAPS
1000.0000 mg | ORAL_CAPSULE | Freq: Two times a day (BID) | ORAL | Status: DC
  Administered 2015-09-10 – 2015-09-13 (×6): 1000 mg via ORAL
  Filled 2015-09-10 (×6): qty 2

## 2015-09-10 MED ORDER — MUSCLE RUB 10-15 % EX CREA
TOPICAL_CREAM | CUTANEOUS | Status: DC | PRN
  Administered 2015-09-10: 02:00:00 via TOPICAL
  Filled 2015-09-10: qty 85

## 2015-09-10 MED ORDER — HYDROMORPHONE HCL 2 MG/ML IJ SOLN
2.0000 mg | INTRAMUSCULAR | Status: DC | PRN
  Administered 2015-09-10 – 2015-09-13 (×24): 2 mg via INTRAVENOUS
  Filled 2015-09-10 (×24): qty 1

## 2015-09-10 MED ORDER — HYDROMORPHONE HCL 1 MG/ML IJ SOLN
1.0000 mg | Freq: Once | INTRAMUSCULAR | Status: AC
  Administered 2015-09-10: 1 mg via INTRAVENOUS
  Filled 2015-09-10: qty 1

## 2015-09-10 NOTE — Progress Notes (Signed)
Patient appears uncomfortable. Rating pain 7-8/10. Last dilaudid at 0100 and ordered q3h prn. Paged NP Baltazar Najjar about adjusting prn pain med. No new orders received. Tylene Fantasia stated that Dr Zigmund Daniel had left orders not to change regimen.

## 2015-09-10 NOTE — Progress Notes (Signed)
[place] PROGRESS NOTE  Norwin Paula [id number] DOB: 04-10-1981 DOA: 09/08/2015 PCP: MATTHEWS,MICHELLE A., MD  Assessment/Plan: Active Problems:   Sickle cell anemia (HCC)   Sickle cell pain crisis ([place])  1. Hb SS with crisis: Pt is an opiate tolerant patient who is rarely hospitalized and who rarely seeks acute medical care is admitted with acute sickle cell crisis. He reports that his pain is still at 8/10 and localized to back, arms and legs. Pain in the head is resolved. He reports that he is still falling asleep after each PCA use. Will decrease PCA bolus dose to 0.4 mg and schedule clinician assisited doses instead of PRN use for the next 24 hours then re-assess. Continue Toradol and IVF.  2. Leukocytosis: No evidence of infection. Likely related to crisis. Will re- evaluate once treated. Recheck CBC with diff tomorrow. 3. Anemia of chronic disease: Hb currently at baseline.    Code Status: Full Code Family Communication: N/A Disposition Plan: Not yet ready for discharge  [place].  Pager [phone]. If 7PM-7AM, please contact night-coverage.  09/10/2015, 10:15 AM  LOS: 2 days     Intermittent History: Pt still in significant pain. However the pain in his head has relieved but pain is still at 8/10 in RUE, low back and legs. He has used only 26.3 mg with 65/51: demands/deliveries on the PCA in the last 24 hours and reports that he is falling asleep after the PCA bolus due to drowsiness. He has been wearing the CO2 monitor incorrectly whic is causing the PCA to cut off. I have instructed patient in the proper use of the CO2 monitor.   Consultants:  None  Procedures:  None  Antibiotics:  None    Objective: Filed Vitals:   09/10/15 0401 09/10/15 0529 09/10/15 0747 09/10/15 0931  BP:  128/70  126/64  Pulse: 83 99  98  Temp:  98.2 F (36.8 C)  98.5 F (36.9 C)  TempSrc:  Oral    Resp: 21 17 18 16   Height:      Weight:  152 lb 6.4 oz (69.128  kg)    SpO2: 100% 100% 96% 100%   Weight change: -6.1 oz (-0.172 kg)  Intake/Output Summary (Last 24 hours) at 09/10/15 1015 Last data filed at 09/10/15 0630  Gross per 24 hour  Intake 2789.5 ml  Output    750 ml  Net 2039.5 ml    General: Alert, awake, oriented x3, in moderate distress due to pain  HEENT: [place]/AT PEERL, EOMI, anicteric Neck: Trachea midline,  no masses, no thyromegal,y no JVD, no carotid bruit OROPHARYNX:  Moist, No exudate/ erythema/lesions.  Heart: Regular rate and rhythm, without murmurs, rubs, gallops, PMI non-displaced, no heaves or thrills on palpation.  Lungs: Clear to auscultation, no wheezing or rhonchi noted. No increased vocal fremitus resonant to percussion  Abdomen: Soft, nontender, nondistended, positive bowel sounds, no masses no hepatosplenomegaly noted..  Neuro: No focal neurological deficits noted cranial nerves II through XII grossly intact.  Strength at functional baseline in bilateral upper and lower extremities. Musculoskeletal: No warm swelling or erythema around joints, no spinal tenderness noted. Psychiatric: Patient alert and oriented x3, good insight and cognition, good recent to remote recall.    Data Reviewed: Basic Metabolic Panel:  Recent Labs Lab 09/08/15 1323 09/08/15 2200  NA 145  --   K 5.1  --   CL 109  --   CO2 27  --   GLUCOSE 104*  --  BUN 8  --   CREATININE 0.69  --   CALCIUM 10.0  --   MG  --  1.7   Liver Function Tests:  Recent Labs Lab 09/08/15 1323  AST 37  ALT 22  ALKPHOS 93  BILITOT 1.8*  PROT 8.8*  ALBUMIN 4.7   No results for input(s): LIPASE, AMYLASE in the last 168 hours. No results for input(s): AMMONIA in the last 168 hours. CBC:  Recent Labs Lab 09/08/15 1323 09/09/15 0310  WBC 9.5 15.8*  NEUTROABS 6.4 12.3*  HGB 8.8* 7.8*  HCT 25.5* 22.0*  MCV 87.9 87.3  PLT 357 273   Cardiac Enzymes:  Recent Labs Lab 09/08/15 2200 09/09/15 0310 09/09/15 0905  TROPONINI <0.03 <0.03 <0.03    BNP (last 3 results) No results for input(s): BNP in the last 8760 hours.  ProBNP (last 3 results) No results for input(s): PROBNP in the last 8760 hours.  CBG: No results for input(s): GLUCAP in the last 168 hours.  No results found for this or any previous visit (from the past 240 hour(s)).   Studies: [name] Contrast  09/08/2015  CLINICAL DATA:  Initial evaluation for acute headache. History of sickle cell anemia. EXAM: MRI HEAD WITHOUT CONTRAST TECHNIQUE: Multiplanar, multiecho pulse sequences of the brain and surrounding structures were obtained without intravenous contrast. COMPARISON:  None. FINDINGS: Cerebral volume within normal limits for patient age. No significant white matter disease present. No foci of restricted diffusion to suggest acute infarct. Gray-white matter differentiation well maintained. Major intracranial vascular flow voids are well preserved. No acute or chronic intracranial hemorrhage. No areas of chronic infarction. No mass lesion, midline shift, or mass effect. No hydrocephalus. No extra-axial fluid collection. Major dural sinuses are patent. 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 Visualized upper cervical spine within normal limits. Pituitary gland normal.  No acute abnormality about the orbits. Few scattered retention cyst noted within the maxillary sinuses. Mild mucosal thickening within the ethmoidal air cells and maxillary sinuses. No air-fluid levels to suggest active sinus infection. No mastoid effusion. Inner ear structures normal. Mildly decreased T1 signal intensity throughout the visualized bone marrow, likely related to underlying history of sickle cell anemia. Visualized osseous structures otherwise unremarkable. No scalp soft tissue abnormality. IMPRESSION: 1. No acute intracranial process. 2. 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 Otherwise  normal MRI of the brain. 3. Mild diffuse decrease in T1 signal intensity throughout the visualized bone marrow, likely related to underlying history of sickle cell anemia. Electronically Signed   By: Jeannine Boga M.D.   On: 09/08/2015 21:23   Dg Chest Port 1 View  09/08/2015  CLINICAL DATA:  Severe headaches, body aches, and fever since this morning. History of sickle cell disease. EXAM: PORTABLE CHEST 1 VIEW COMPARISON:  10/12/2014 FINDINGS: The heart size and mediastinal contours are within normal limits. Both lungs are clear. The visualized skeletal structures are unremarkable. IMPRESSION: No active disease. Electronically Signed   By: Lucienne Capers M.D.   On: 09/08/2015 21:41    Scheduled Meds: . cholecalciferol  1,000 Units Oral Daily  . folic acid  1 mg Oral Daily  . heparin  5,000 Units Subcutaneous 3 times per day  . HYDROmorphone   Intravenous 6 times per day  . hydroxyurea  500 mg Oral BID  . ketorolac  30 mg Intravenous 4 times per day  . senna-docusate  1 tablet  Oral BID   Continuous Infusions: . sodium chloride 1,000 mL (09/10/15 0006)    Time spent 25 minutes

## 2015-09-10 NOTE — Progress Notes (Signed)
Patient appeared to be in significant pain at 1800, rating his pain "7" this RN called doctor on call to request adjustment to his regimen. Ordered was a one-time dose of 1-mg dilaudid and an increase in PCA dose to 0.66 mg/10 min. Patient achieved benefit from the adjustment.sleeping at this time

## 2015-09-11 LAB — CBC WITH DIFFERENTIAL/PLATELET
Basophils Absolute: 0 10*3/uL (ref 0.0–0.1)
Basophils Relative: 0 %
EOS ABS: 0.2 10*3/uL (ref 0.0–0.7)
Eosinophils Relative: 2 %
HCT: 20.6 % — ABNORMAL LOW (ref 39.0–52.0)
Hemoglobin: 7.3 g/dL — ABNORMAL LOW (ref 13.0–17.0)
LYMPHS PCT: 25 %
Lymphs Abs: 2.2 10*3/uL (ref 0.7–4.0)
MCH: 30.4 pg (ref 26.0–34.0)
MCHC: 35.4 g/dL (ref 30.0–36.0)
MCV: 85.8 fL (ref 78.0–100.0)
MONO ABS: 0.6 10*3/uL (ref 0.1–1.0)
Monocytes Relative: 7 %
NEUTROS ABS: 5.8 10*3/uL (ref 1.7–7.7)
NRBC: 13 /100{WBCs} — AB
Neutrophils Relative %: 66 %
PLATELETS: ADEQUATE 10*3/uL (ref 150–400)
RBC: 2.4 MIL/uL — ABNORMAL LOW (ref 4.22–5.81)
RDW: 18.5 % — AB (ref 11.5–15.5)
WBC: 8.8 10*3/uL (ref 4.0–10.5)

## 2015-09-11 LAB — BASIC METABOLIC PANEL
Anion gap: 8 (ref 5–15)
BUN: 6 mg/dL (ref 6–20)
CALCIUM: 8.5 mg/dL — AB (ref 8.9–10.3)
CHLORIDE: 98 mmol/L — AB (ref 101–111)
CO2: 27 mmol/L (ref 22–32)
CREATININE: 0.48 mg/dL — AB (ref 0.61–1.24)
GFR calc Af Amer: >60 mL/min (ref >60)
Glucose, Bld: 109 mg/dL — ABNORMAL HIGH (ref 65–99)
Potassium: 4.1 mmol/L (ref 3.5–5.1)
Sodium: 133 mmol/L — ABNORMAL LOW (ref 135–145)

## 2015-09-11 LAB — RETICULOCYTES
RBC.: 2.4 MIL/uL — ABNORMAL LOW (ref 4.22–5.81)
RETIC CT PCT: 11 % — AB (ref 0.4–3.1)
Retic Count, Absolute: 264 10*3/uL — ABNORMAL HIGH (ref 19.0–186.0)

## 2015-09-11 MED ORDER — HYDROMORPHONE 1 MG/ML IV SOLN
INTRAVENOUS | Status: DC
  Administered 2015-09-11: 4.3 mg via INTRAVENOUS
  Administered 2015-09-11: 1.5 mg via INTRAVENOUS
  Administered 2015-09-11: 3.5 mg via INTRAVENOUS
  Administered 2015-09-11: 1 mg via INTRAVENOUS
  Administered 2015-09-12: 13:00:00 via INTRAVENOUS
  Administered 2015-09-12: 1 mg via INTRAVENOUS
  Administered 2015-09-12: 0.5 mg via INTRAVENOUS
  Administered 2015-09-12: 0 mg via INTRAVENOUS
  Administered 2015-09-12: 2 mg via INTRAVENOUS
  Administered 2015-09-12: 1 mg via INTRAVENOUS
  Administered 2015-09-12: 1.5 mg via INTRAVENOUS
  Administered 2015-09-13: 1 mg via INTRAVENOUS
  Administered 2015-09-13: 1.5 mg via INTRAVENOUS
  Filled 2015-09-11: qty 25

## 2015-09-11 NOTE — Plan of Care (Signed)
Problem: Pain Managment: Goal: General experience of comfort will improve Outcome: Progressing Patient reports pain between 6-8 in low back and legs/thighs bilaterally and having pain in shoulders right > left. On PCA/toradol and q2h prn.

## 2015-09-11 NOTE — Progress Notes (Addendum)
[name] PROGRESS NOTE  [name] [id number] DOB: [birth year] DOA: 09/08/2015 PCP: Jarryd Gratz A., MD  Assessment/Plan: Active Problems:   Sickle cell anemia (HCC)   Sickle cell pain crisis ([place])  1. Hb SS with crisis:  Will change PCA bolus dose to 0.5 mg and continue scheduled clinician assisited doses instead of PRN use for the next 8 hours then re-assess. Continue Toradol and decrease IVF to [facility] rate.  2. Leukocytosis: resolved. 3. Anemia of chronic disease: Hb currently at baseline.  4. Hyponatremia: Will D/C IVF and recheck electrolytes tomorrow.   Code Status: Full Code Family Communication: N/A Disposition Plan: Not yet ready for discharge  [place].  Pager [phone]. If 7PM-7AM, please contact night-coverage.  09/11/2015, 11:29 AM  LOS: 3 days     Intermittent History: Pt reports pain in legs, back arms and intermittently in head. Intensity at 6/10.   He has used only 28.8 mg with 67/49: demands/deliveries on the PCA in the last 24 hours. Pt denies any cough, emesis or diarrhea. Last BM yesterday.  Consultants:  None  Procedures:  None  Antibiotics:  None    Objective: Filed Vitals:   09/11/15 0535 09/11/15 0624 09/11/15 0833 09/11/15 1030  BP:    123/65  Pulse: 86 92  76  Temp:    98.4 F (36.9 C)  TempSrc:    Oral  Resp: 15 12 18 16   Height:      Weight:      SpO2: 93% 100% 98% 99%   Weight change: 0 lb (0 kg)  Intake/Output Summary (Last 24 hours) at 09/11/15 1129 Last data filed at 09/11/15 0544  Gross per 24 hour  Intake 2773.98 ml  Output    600 ml  Net 2173.98 ml    General: Alert, awake, oriented x3, in mild distress due to pain  HEENT: [place]/AT PEERL, EOMI, anicteric Neck: Trachea midline,  no masses, no thyromegal,y no JVD, no carotid bruit OROPHARYNX:  Moist, No exudate/ erythema/lesions.  Heart: Regular rate and rhythm, without murmurs, rubs, gallops, PMI non-displaced, no heaves or thrills on  palpation.  Lungs: Clear to auscultation, no wheezing or rhonchi noted. No increased vocal fremitus resonant to percussion  Abdomen: Soft, nontender, nondistended, positive bowel sounds, no masses no hepatosplenomegaly noted..  Neuro: No focal neurological deficits noted cranial nerves II through XII grossly intact.  Strength at functional baseline in bilateral upper and lower extremities. Musculoskeletal: No warm swelling or erythema around joints, no spinal tenderness noted. Psychiatric: Patient alert and oriented x3, good insight and cognition, good recent to remote recall.    Data Reviewed: Basic Metabolic Panel:  Recent Labs Lab 09/08/15 1323 09/08/15 2200 09/11/15 0355  NA 145  --  133*  K 5.1  --  4.1  CL 109  --  98*  CO2 27  --  27  GLUCOSE 104*  --  109*  BUN 8  --  6  CREATININE 0.69  --  0.48*  CALCIUM 10.0  --  8.5*  MG  --  1.7  --    Liver Function Tests:  Recent Labs Lab 09/08/15 1323  AST 37  ALT 22  ALKPHOS 93  BILITOT 1.8*  PROT 8.8*  ALBUMIN 4.7   No results for input(s): LIPASE, AMYLASE in the last 168 hours. No results for input(s): AMMONIA in the last 168 hours. CBC:  Recent Labs Lab 09/08/15 1323 09/09/15 0310 09/11/15 0355  WBC 9.5 15.8* 8.8  NEUTROABS 6.4 12.3* 5.8  HGB 8.8* 7.8* 7.3*  HCT 25.5* 22.0* 20.6*  MCV 87.9 87.3 85.8  PLT 357 273 PLATELET CLUMPS NOTED ON SMEAR, COUNT APPEARS ADEQUATE   Cardiac Enzymes:  Recent Labs Lab 09/08/15 2200 09/09/15 0310 09/09/15 0905  TROPONINI <0.03 <0.03 <0.03   BNP (last 3 results) No results for input(s): BNP in the last 8760 hours.  ProBNP (last 3 results) No results for input(s): PROBNP in the last 8760 hours.  CBG: No results for input(s): GLUCAP in the last 168 hours.  No results found for this or any previous visit (from the past 240 hour(s)).   Studies: [name] Contrast  09/08/2015  CLINICAL DATA:  Initial evaluation for acute headache. History of sickle cell  anemia. EXAM: MRI HEAD WITHOUT CONTRAST TECHNIQUE: Multiplanar, multiecho pulse sequences of the brain and surrounding structures were obtained without intravenous contrast. COMPARISON:  None. FINDINGS: Cerebral volume within normal limits for patient age. No significant white matter disease present. No foci of restricted diffusion to suggest acute infarct. Gray-white matter differentiation well maintained. Major intracranial vascular flow voids are well preserved. No acute or chronic intracranial hemorrhage. No areas of chronic infarction. No mass lesion, midline shift, or mass effect. No hydrocephalus. No extra-axial fluid collection. Major dural sinuses are patent. 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 Visualized upper cervical spine within normal limits. Pituitary gland normal.  No acute abnormality about the orbits. Few scattered retention cyst noted within the maxillary sinuses. Mild mucosal thickening within the ethmoidal air cells and maxillary sinuses. No air-fluid levels to suggest active sinus infection. No mastoid effusion. Inner ear structures normal. Mildly decreased T1 signal intensity throughout the visualized bone marrow, likely related to underlying history of sickle cell anemia. Visualized osseous structures otherwise unremarkable. No scalp soft tissue abnormality. IMPRESSION: 1. No acute intracranial process. 2. 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 Otherwise normal MRI of the brain. 3. Mild diffuse decrease in T1 signal intensity throughout the visualized bone marrow, likely related to underlying history of sickle cell anemia. Electronically Signed   By: Jeannine Boga M.D.   On: 09/08/2015 21:23   Dg Chest Port 1 View  09/08/2015  CLINICAL DATA:  Severe headaches, body aches, and fever since this morning. History of sickle cell disease. EXAM: PORTABLE CHEST 1 VIEW COMPARISON:   10/12/2014 FINDINGS: The heart size and mediastinal contours are within normal limits. Both lungs are clear. The visualized skeletal structures are unremarkable. IMPRESSION: No active disease. Electronically Signed   By: Lucienne Capers M.D.   On: 09/08/2015 21:41    Scheduled Meds: . cholecalciferol  1,000 Units Oral Daily  . folic acid  1 mg Oral Daily  . heparin  5,000 Units Subcutaneous 3 times per day  . HYDROmorphone   Intravenous 6 times per day  . hydroxyurea  1,000 mg Oral BID  . ketorolac  30 mg Intravenous 4 times per day  . senna-docusate  1 tablet Oral BID   Continuous Infusions: . sodium chloride 10 mL/hr at 09/11/15 1011    Time spent 25 minutes

## 2015-09-11 NOTE — Care Management Important Message (Signed)
Important Message  Patient Details  Name: [name] MRN: [id number] Date of Birth: Jan 10, 1981   Medicare Important Message Given:  Yes    Camillo Flaming 09/11/2015, 11:29 AMImportant Message  Patient Details  Name: [name] MRN: [id number] Date of Birth: [birth year]   Medicare Important Message Given:  Yes    Camillo Flaming 09/11/2015, 11:29 AM

## 2015-09-12 ENCOUNTER — Inpatient Hospital Stay (HOSPITAL_COMMUNITY): Payer: Medicare Other

## 2015-09-12 LAB — BASIC METABOLIC PANEL
ANION GAP: 9 (ref 5–15)
BUN: 7 mg/dL (ref 6–20)
CO2: 30 mmol/L (ref 22–32)
Calcium: 9.1 mg/dL (ref 8.9–10.3)
Chloride: 100 mmol/L — ABNORMAL LOW (ref 101–111)
Creatinine, Ser: 0.67 mg/dL (ref 0.61–1.24)
GFR calc Af Amer: >60 mL/min (ref >60)
GFR calc non Af Amer: >60 mL/min (ref >60)
GLUCOSE: 120 mg/dL — AB (ref 65–99)
POTASSIUM: 3.6 mmol/L (ref 3.5–5.1)
Sodium: 139 mmol/L (ref 135–145)

## 2015-09-12 NOTE — Progress Notes (Signed)
[place] PROGRESS NOTE  [name] [id number] DOB: [birth year] DOA: 09/08/2015 PCP: MATTHEWS,MICHELLE A., MD  Assessment/Plan: Active Problems:   Sickle cell anemia (HCC)   Sickle cell pain crisis ([place])  1. Hb SS with crisis:  Will continue current regimen and check CT chest to rule out PE. Continue Toradol and decrease IVF to [facility] rate.  2. Leukocytosis: resolved. 3. Anemia of chronic disease: Hb currently at baseline.  Hyponatremia: Resolved. Continue to monitor.  Code Status: Full Code Family Communication: N/A Disposition Plan: Not yet ready for discharge  [facility]  Pager [phone]. If 7PM-7AM, please contact night-coverage.  09/12/2015, 9:22 PM  LOS: 4 days     Intermittent History: Pt reports pain in legs, back arms and intermittently in head. Intensity at 5/10.   He has used only 26 mg with 61/42: demands/deliveries on the PCA in the last 24 hours. Pt denies any cough, emesis or diarrhea. Last BM yesterday.  Consultants:  None  Procedures:  None  Antibiotics:  None    Objective: Filed Vitals:   09/12/15 1600 09/12/15 1700 09/12/15 1953 09/12/15 2045  BP:  122/70  105/87  Pulse:  82  78  Temp:  98 F (36.7 C)  99.6 F (37.6 C)  TempSrc:  Oral  Oral  Resp: 17 16 17 18   Height:      Weight:      SpO2: 100% 100% 100% 100%   Weight change: 0.091 kg (3.2 oz)  Intake/Output Summary (Last 24 hours) at 09/12/15 2122 Last data filed at 09/12/15 1230  Gross per 24 hour  Intake 1787.43 ml  Output   3150 ml  Net -1362.57 ml    General: Alert, awake, oriented x3, in mild distress due to pain  HEENT: [place]/AT PEERL, EOMI, anicteric Neck: Trachea midline,  no masses, no thyromegal,y no JVD, no carotid bruit OROPHARYNX:  Moist, No exudate/ erythema/lesions.  Heart: Regular rate and rhythm, without murmurs, rubs, gallops, PMI non-displaced, no heaves or thrills on palpation.  Lungs: Clear to auscultation, no wheezing or rhonchi noted. No  increased vocal fremitus resonant to percussion  Abdomen: Soft, nontender, nondistended, positive bowel sounds, no masses no hepatosplenomegaly noted..  Neuro: No focal neurological deficits noted cranial nerves II through XII grossly intact.  Strength at functional baseline in bilateral upper and lower extremities. Musculoskeletal: No warm swelling or erythema around joints, no spinal tenderness noted. Psychiatric: Patient alert and oriented x3, good insight and cognition, good recent to remote recall.    Data Reviewed: Basic Metabolic Panel:  Recent Labs Lab 09/08/15 1323 09/08/15 2200 09/11/15 0355 09/12/15 0406  NA 145  --  133* 139  K 5.1  --  4.1 3.6  CL 109  --  98* 100*  CO2 27  --  27 30  GLUCOSE 104*  --  109* 120*  BUN 8  --  6 7  CREATININE 0.69  --  0.48* 0.67  CALCIUM 10.0  --  8.5* 9.1  MG  --  1.7  --   --    Liver Function Tests:  Recent Labs Lab 09/08/15 1323  AST 37  ALT 22  ALKPHOS 93  BILITOT 1.8*  PROT 8.8*  ALBUMIN 4.7   No results for input(s): LIPASE, AMYLASE in the last 168 hours. No results for input(s): AMMONIA in the last 168 hours. CBC:  Recent Labs Lab 09/08/15 1323 09/09/15 0310 09/11/15 0355  WBC 9.5 15.8* 8.8  NEUTROABS 6.4 12.3* 5.8  HGB 8.8* 7.8* 7.3*  HCT 25.5* 22.0* 20.6*  MCV 87.9 87.3 85.8  PLT 357 273 PLATELET CLUMPS NOTED ON SMEAR, COUNT APPEARS ADEQUATE   Cardiac Enzymes:  Recent Labs Lab 09/08/15 2200 09/09/15 0310 09/09/15 0905  TROPONINI <0.03 <0.03 <0.03   BNP (last 3 results) No results for input(s): BNP in the last 8760 hours.  ProBNP (last 3 results) No results for input(s): PROBNP in the last 8760 hours.  CBG: No results for input(s): GLUCAP in the last 168 hours.  No results found for this or any previous visit (from the past 240 hour(s)).   Studies: Dg Chest 2 View  09/12/2015  CLINICAL DATA:  Sickle cell pain crisis started 5 days ago. Shortness of breath today. EXAM: CHEST  2 VIEW  COMPARISON:  09/08/2015 FINDINGS: Mild cardiomegaly. Lungs are clear. No effusions or edema. No acute bony abnormality. IMPRESSION: Cardiomegaly.  No active disease. Electronically Signed   By: Rolm Baptise M.D.   On: 09/12/2015 12:38   [name] Contrast  09/08/2015  CLINICAL DATA:  Initial evaluation for acute headache. History of sickle cell anemia. EXAM: MRI HEAD WITHOUT CONTRAST TECHNIQUE: Multiplanar, multiecho pulse sequences of the brain and surrounding structures were obtained without intravenous contrast. COMPARISON:  None. FINDINGS: Cerebral volume within normal limits for patient age. No significant white matter disease present. No foci of restricted diffusion to suggest acute infarct. Gray-white matter differentiation well maintained. Major intracranial vascular flow voids are well preserved. No acute or chronic intracranial hemorrhage. No areas of chronic infarction. No mass lesion, midline shift, or mass effect. No hydrocephalus. No extra-axial fluid collection. Major dural sinuses are patent. 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 Visualized upper cervical spine within normal limits. Pituitary gland normal.  No acute abnormality about the orbits. Few scattered retention cyst noted within the maxillary sinuses. Mild mucosal thickening within the ethmoidal air cells and maxillary sinuses. No air-fluid levels to suggest active sinus infection. No mastoid effusion. Inner ear structures normal. Mildly decreased T1 signal intensity throughout the visualized bone marrow, likely related to underlying history of sickle cell anemia. Visualized osseous structures otherwise unremarkable. No scalp soft tissue abnormality. IMPRESSION: 1. No acute intracranial process. 2. 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 Otherwise normal MRI of the brain. 3. Mild diffuse decrease in T1 signal intensity throughout  the visualized bone marrow, likely related to underlying history of sickle cell anemia. Electronically Signed   By: Jeannine Boga M.D.   On: 09/08/2015 21:23   Dg Chest Port 1 View  09/08/2015  CLINICAL DATA:  Severe headaches, body aches, and fever since this morning. History of sickle cell disease. EXAM: PORTABLE CHEST 1 VIEW COMPARISON:  10/12/2014 FINDINGS: The heart size and mediastinal contours are within normal limits. Both lungs are clear. The visualized skeletal structures are unremarkable. IMPRESSION: No active disease. Electronically Signed   By: Lucienne Capers M.D.   On: 09/08/2015 21:41    Scheduled Meds: . cholecalciferol  1,000 Units Oral Daily  . folic acid  1 mg Oral Daily  . heparin  5,000 Units Subcutaneous 3 times per day  . HYDROmorphone   Intravenous 6 times per day  . hydroxyurea  1,000 mg Oral BID  . ketorolac  30 mg Intravenous 4 times per day  . senna-docusate  1 tablet Oral BID   Continuous Infusions: . sodium chloride 10 mL/hr at 09/11/15 1011    Time spent 25 minutes

## 2015-09-13 LAB — COMPREHENSIVE METABOLIC PANEL
ALT: 69 U/L — AB (ref 17–63)
ANION GAP: 11 (ref 5–15)
AST: 69 U/L — ABNORMAL HIGH (ref 15–41)
Albumin: 3.6 g/dL (ref 3.5–5.0)
Alkaline Phosphatase: 138 U/L — ABNORMAL HIGH (ref 38–126)
BUN: 8 mg/dL (ref 6–20)
CHLORIDE: 102 mmol/L (ref 101–111)
CO2: 28 mmol/L (ref 22–32)
CREATININE: 0.68 mg/dL (ref 0.61–1.24)
Calcium: 9.1 mg/dL (ref 8.9–10.3)
GFR calc non Af Amer: >60 mL/min (ref >60)
Glucose, Bld: 106 mg/dL — ABNORMAL HIGH (ref 65–99)
POTASSIUM: 3.9 mmol/L (ref 3.5–5.1)
SODIUM: 141 mmol/L (ref 135–145)
Total Bilirubin: 2.2 mg/dL — ABNORMAL HIGH (ref 0.3–1.2)
Total Protein: 7.7 g/dL (ref 6.5–8.1)

## 2015-09-13 LAB — CBC WITH DIFFERENTIAL/PLATELET
BASOS PCT: 0 %
Basophils Absolute: 0 10*3/uL (ref 0.0–0.1)
EOS ABS: 0.2 10*3/uL (ref 0.0–0.7)
Eosinophils Relative: 3 %
HCT: 18.6 % — ABNORMAL LOW (ref 39.0–52.0)
Hemoglobin: 6.5 g/dL — CL (ref 13.0–17.0)
Lymphocytes Relative: 33 %
Lymphs Abs: 2.6 10*3/uL (ref 0.7–4.0)
MCH: 30.5 pg (ref 26.0–34.0)
MCHC: 34.9 g/dL (ref 30.0–36.0)
MCV: 87.3 fL (ref 78.0–100.0)
MONO ABS: 0.5 10*3/uL (ref 0.1–1.0)
Monocytes Relative: 7 %
NEUTROS PCT: 57 %
Neutro Abs: 4.5 10*3/uL (ref 1.7–7.7)
PLATELETS: 204 10*3/uL (ref 150–400)
RBC: 2.13 MIL/uL — ABNORMAL LOW (ref 4.22–5.81)
RDW: 18.3 % — ABNORMAL HIGH (ref 11.5–15.5)
WBC: 7.8 10*3/uL (ref 4.0–10.5)

## 2015-09-13 NOTE — Discharge Summary (Signed)
Physician Discharge Summary  Patient ID: [name] MRN: [id number] DOB/AGE: [birth year] 35 y.o.  Admit date: 09/08/2015 Discharge date: 09/13/2015  Admission Diagnoses:  Discharge Diagnoses:  Active Problems:   Sickle cell anemia (HCC)   Sickle cell pain crisis [facility])   Discharged Condition: good  Hospital Course: Patient was admitted with sickle cell painful crisis. Also had nausea and vomiting. Was started on IV Dilaudid PCA with Toradol and IV fluids. Patient responded to treatment. He was gradually transition to his oral medications. He responded to treatment and at the time of discharge he was doing much better with pain down to 3 out of 10. He has been discharged to follow with his primary care physician.  Consults: None  Significant Diagnostic Studies: labs: CBCs and CMP is checked. No transfusion was warranted.  Treatments: IV hydration and analgesia: Dilaudid  Discharge Exam: Blood pressure 127/85, pulse 86, temperature 98.1 F (36.7 C), temperature source Oral, resp. rate 18, height 6\' 1"  (1.854 m), weight 66.5 kg (146 lb 9.7 oz), SpO2 98 %. General appearance: alert, cooperative and no distress Head: Normocephalic, without obvious abnormality, atraumatic Neck: no adenopathy, no carotid bruit, no JVD, supple, symmetrical, trachea midline and thyroid not enlarged, symmetric, no tenderness/mass/nodules Back: symmetric, no curvature. ROM normal. No CVA tenderness. Resp: clear to auscultation bilaterally Chest wall: no tenderness Cardio: regular rate and rhythm, S1, S2 normal, no murmur, click, rub or gallop GI: soft, non-tender; bowel sounds normal; no masses,  no organomegaly Extremities: extremities normal, atraumatic, no cyanosis or edema Pulses: 2+ and symmetric Skin: Skin color, texture, turgor normal. No rashes or lesions Neurologic: Grossly normal  Disposition: 01-Home or Self Care     Medication List    TAKE these medications        cholecalciferol 1000  units tablet  Commonly known as:  VITAMIN D  Take 1,000 Units by mouth daily.     folic acid A999333 MCG tablet  Commonly known as:  FOLVITE  Take 400 mcg by mouth daily.     hydroxyurea 500 MG capsule  Commonly known as:  HYDREA  TAKE 2 CAPSULES BY MOUTH TWICE A DAY MAY TAKE WITH FOOD TO MINIMIZE GI SIDE EFFECTS     oxyCODONE-acetaminophen 10-325 MG tablet  Commonly known as:  PERCOCET  Take 1 tablet by mouth every 4 (four) hours as needed for pain.         SignedBarbette Merino 09/13/2015, 12:15 PM  Time spent 35 minutes

## 2015-09-14 ENCOUNTER — Telehealth: Payer: Self-pay | Admitting: Internal Medicine

## 2015-09-14 DIAGNOSIS — G8929 Other chronic pain: Secondary | ICD-10-CM

## 2015-09-14 DIAGNOSIS — D571 Sickle-cell disease without crisis: Secondary | ICD-10-CM

## 2015-09-14 NOTE — Telephone Encounter (Signed)
Refill request for percocet 10/325mg  LOV 07/24/2015. Please advise. Thanks!

## 2015-09-15 MED ORDER — OXYCODONE-ACETAMINOPHEN 10-325 MG PO TABS: 1.0000 | ORAL_TABLET | ORAL | Status: DC | PRN

## 2015-09-15 NOTE — Telephone Encounter (Signed)
Reviewed [place] Substance Reporting system prior to prescribing opiate medications, no inconsistencies noted.  Meds ordered this encounter  Medications  . oxyCODONE-acetaminophen (PERCOCET) 10-325 MG tablet    Sig: Take 1 tablet by mouth every 4 (four) hours as needed for pain.    Dispense:  90 tablet    Refill:  0    Order Specific Question:   Supervising Provider    Answer:   JEGEDE, OLUGBEMIGA E [[id number]]     Joletta Manner M, FNP 

## 2015-10-02 ENCOUNTER — Ambulatory Visit (INDEPENDENT_AMBULATORY_CARE_PROVIDER_SITE_OTHER): Payer: Medicare Other | Admitting: Family Medicine

## 2015-10-02 ENCOUNTER — Encounter: Payer: Self-pay | Admitting: Family Medicine

## 2015-10-02 VITALS — BP 124/68 | HR 67 | Temp 97.8°F | Resp 16 | Ht 73.0 in | Wt 145.0 lb

## 2015-10-02 DIAGNOSIS — D571 Sickle-cell disease without crisis: Secondary | ICD-10-CM

## 2015-10-02 DIAGNOSIS — G8929 Other chronic pain: Secondary | ICD-10-CM | POA: Diagnosis not present

## 2015-10-02 MED ORDER — OXYCODONE-ACETAMINOPHEN 10-325 MG PO TABS: 1.0000 | ORAL_TABLET | ORAL | Status: DC | PRN

## 2015-10-02 NOTE — Patient Instructions (Signed)
Remember no controlled substances except from [place]. Follow-up in one month. Call here or go to ED for Pain Crisis.

## 2015-10-02 NOTE — Progress Notes (Signed)
Patient ID: [name], male   DOB: 07/30/1981, 35 y.o.   MRN: [id number]   Hrithik Goertz, is a 35 y.o. male  [id number]  [id number]  DOB - 05/01/1981  CC:  Chief Complaint  Patient presents with  . Follow-up    scd       HPI: [name] is a 35 y.o. male here sickle cell follow-up. Since his last visit here on 08/13/15, he was admitted to the hospital on 09-08-15 for a pain crisis for 5 days. He is currently take his hydroxurea regularly. He reports taking 1/2 of his oxycodone 4-6 times a day but he does report needing a refill. He is not on a long acting opoid. He reports no recent hip pain. Most of his pain is an aching in his lower back and extremeties.  He reports his pain now is 0/10. His last pain medication was earlier today.   Health Maintenance:  He reports having a flu shot elsewhere in late 2016.   No Known Allergies Past Medical History  Diagnosis Date  . Sickle cell anemia (HCC)    Current Outpatient Prescriptions on File Prior to Visit  Medication Sig Dispense Refill  . cholecalciferol (VITAMIN D) 1000 UNITS tablet Take 1,000 Units by mouth daily.    . folic acid (FOLVITE) A999333 MCG tablet Take 400 mcg by mouth daily.    . hydroxyurea (HYDREA) 500 MG capsule TAKE 2 CAPSULES BY MOUTH TWICE A DAY MAY TAKE WITH FOOD TO MINIMIZE GI SIDE EFFECTS (Patient taking differently: Take 2 capsules by mouth twice a day. May take with food to minimize GI side effects.) 120 capsule 3   No current facility-administered medications on file prior to visit.   Family History  Problem Relation Age of Onset  . Sickle cell anemia Mother   . Sickle cell trait Father   . Sickle cell trait Sister   . Sickle cell trait Brother   . Cancer Maternal Grandmother    Social History   Social History  . Marital Status: Single    Spouse Name: N/A  . Number of Children: N/A  . Years of Education: N/A   Occupational History  . Not on file.   Social History Main Topics  . Smoking  status: Never Smoker   . Smokeless tobacco: Not on file  . Alcohol Use: No  . Drug Use: No  . Sexual Activity: Yes   Other Topics Concern  . Not on file   Social History Narrative    Review of Systems: Constitutional: Negative for fever, chills, weight loss, appetite loss HENT: Denies Problems Eyes: Denies problems Neck: Denies problems Respiratory: Negative for cough, shortness of breath,   Cardiovascular: Negative for chest pain, palpitations and leg swelling. Gastrointestinal: Negative for abdominal pain, nausea,vomitng, diarrhea, constipation. Genitourinary: Denies problems Musculoskeletal: Denies problems Neurological: Denies problems Hematological: Denies problems Psychiatric/Behavioral: Denies depression, anxiety.   Objective:   Filed Vitals:   10/02/15 1317  BP: 124/68  Pulse: 67  Temp: 97.8 F (36.6 C)  Resp: 16    Physical Exam: Constitutional: Patient appears well-developed and well-nourished. No distress. HENT: Normocephalic, atraumatic, External right and left ear normal. Oropharynx is clear and moist.  Eyes: Conjunctivae and EOM are normal. PERRLA, no scleral icterus. Neck: Normal ROM. Neck supple. No lymphadenopathy, No thyromegaly. CVS: RRR, S1/S2 +, no murmurs, no gallops, no rubs Pulmonary: Effort and breath sounds normal, no stridor, rhonchi, wheezes, rales.  Abdominal: Soft. Normoactive BS,, no distension, tenderness, rebound or guarding.  Musculoskeletal: Normal range of motion. No edema and no tenderness.  Neuro: Alert.Normal muscle tone coordination. Non-focal Skin: Skin is warm and dry. No rash noted. Not diaphoretic. No erythema. No pallor. Psychiatric: Normal mood and affect. Behavior, judgment, thought content normal.  Lab Results  Component Value Date   WBC 7.8 09/13/2015   HGB 6.5* 09/13/2015   HCT 18.6* 09/13/2015   MCV 87.3 09/13/2015   PLT 204 09/13/2015   Lab Results  Component Value Date   CREATININE 0.68 09/13/2015   BUN  8 09/13/2015   NA 141 09/13/2015   K 3.9 09/13/2015   CL 102 09/13/2015   CO2 28 09/13/2015    No results found for: HGBA1C Lipid Panel  No results found for: CHOL, TRIG, HDL, CHOLHDL, VLDL, LDLCALC     Assessment and plan:   1. Chronic pain  - oxyCODONE-acetaminophen (PERCOCET) 10-325 MG tablet; Take 1 tablet by mouth every 4 (four) hours as needed for pain.  Dispense: 90 tablet; Refill: 0  2. Hb-SS disease without crisis ([place])  - oxyCODONE-acetaminophen (PERCOCET) 10-325 MG tablet; Take 1 tablet by mouth every 4 (four) hours as needed for pain.  Dispense: 90 tablet; Refill: 0 - on his next visit he needs a urine for protein. He also needs a dilated eye exam, which we will go ahead and schedule -Opthamalogy referral for dilated eye exam -Urine for protein.    Patient counseled and given handout on use of opoid pain medications, including need to only get from [place] and our ability to follow use on [place]  CSRS and need to keep in a safe locked place away from children and pets. Have reviewed our refill policy related to erly refills if lost or stolen. Have reviewed possible side effects of opoids, Hydrea and need to take other SCD related medications as ordered.  Have review health maintenance needs, including immunizations, urine for proteim, dilated eye exam.  Have review the importance of smoking cessation if currently smoking.   The patient was given clear instructions to go to ER or return to medical center if symptoms don't improve, worsen or new problems develop. The patient verbalized understanding. The patient was told to call to get lab results if they haven't heard anything in the next week.     Return in about 1 month (around 10/30/2015).       Micheline Chapman, MSN, FNP-BC   10/02/2015, 1:36 PM

## 2015-10-03 LAB — MICROALBUMIN / CREATININE URINE RATIO
Creatinine, Urine: 114 mg/dL (ref 20–370)
MICROALB/CREAT RATIO: 7 ug/mg{creat} (ref <30)
Microalb, Ur: 0.8 mg/dL

## 2015-11-04 ENCOUNTER — Encounter: Payer: Self-pay | Admitting: Family Medicine

## 2015-11-04 ENCOUNTER — Ambulatory Visit (INDEPENDENT_AMBULATORY_CARE_PROVIDER_SITE_OTHER): Payer: Medicare Other | Admitting: Family Medicine

## 2015-11-04 VITALS — BP 117/71 | HR 62 | Temp 97.5°F | Resp 14 | Ht 73.0 in | Wt 146.0 lb

## 2015-11-04 DIAGNOSIS — D571 Sickle-cell disease without crisis: Secondary | ICD-10-CM

## 2015-11-04 DIAGNOSIS — G8929 Other chronic pain: Secondary | ICD-10-CM | POA: Diagnosis not present

## 2015-11-04 LAB — CBC WITH DIFFERENTIAL/PLATELET
Basophils Absolute: 0.1 10*3/uL (ref 0.0–0.1)
Basophils Relative: 1 % (ref 0–1)
EOS PCT: 2 % (ref 0–5)
Eosinophils Absolute: 0.1 10*3/uL (ref 0.0–0.7)
HEMATOCRIT: 26.7 % — AB (ref 39.0–52.0)
HEMOGLOBIN: 9.2 g/dL — AB (ref 13.0–17.0)
LYMPHS ABS: 3.2 10*3/uL (ref 0.7–4.0)
LYMPHS PCT: 52 % — AB (ref 12–46)
MCH: 32.1 pg (ref 26.0–34.0)
MCHC: 34.5 g/dL (ref 30.0–36.0)
MCV: 93 fL (ref 78.0–100.0)
MONO ABS: 0.6 10*3/uL (ref 0.1–1.0)
MONOS PCT: 9 % (ref 3–12)
MPV: 8.3 fL — ABNORMAL LOW (ref 8.6–12.4)
Neutro Abs: 2.2 10*3/uL (ref 1.7–7.7)
Neutrophils Relative %: 36 % — ABNORMAL LOW (ref 43–77)
Platelets: 806 10*3/uL — ABNORMAL HIGH (ref 150–400)
RBC: 2.87 MIL/uL — ABNORMAL LOW (ref 4.22–5.81)
RDW: 20.9 % — ABNORMAL HIGH (ref 11.5–15.5)
WBC: 6.2 10*3/uL (ref 4.0–10.5)

## 2015-11-04 LAB — POCT URINALYSIS DIP (DEVICE)
BILIRUBIN URINE: NEGATIVE
Glucose, UA: NEGATIVE mg/dL
HGB URINE DIPSTICK: NEGATIVE
KETONES UR: NEGATIVE mg/dL
LEUKOCYTES UA: NEGATIVE
NITRITE: NEGATIVE
PH: 6 (ref 5.0–8.0)
Protein, ur: NEGATIVE mg/dL
Specific Gravity, Urine: 1.015 (ref 1.005–1.030)
Urobilinogen, UA: 1 mg/dL (ref 0.0–1.0)

## 2015-11-04 LAB — COMPLETE METABOLIC PANEL WITH GFR
ALT: 10 U/L (ref 9–46)
AST: 20 U/L (ref 10–40)
Albumin: 4.5 g/dL (ref 3.6–5.1)
Alkaline Phosphatase: 72 U/L (ref 40–115)
BUN: 7 mg/dL (ref 7–25)
CHLORIDE: 103 mmol/L (ref 98–110)
CO2: 25 mmol/L (ref 20–31)
CREATININE: 0.76 mg/dL (ref 0.60–1.35)
Calcium: 9.4 mg/dL (ref 8.6–10.3)
GFR, Est African American: >89 mL/min (ref >=60)
GFR, Est Non African American: >89 mL/min (ref >=60)
GLUCOSE: 91 mg/dL (ref 65–99)
Potassium: 4 mmol/L (ref 3.5–5.3)
Sodium: 140 mmol/L (ref 135–146)
Total Bilirubin: 2.1 mg/dL — ABNORMAL HIGH (ref 0.2–1.2)
Total Protein: 7.7 g/dL (ref 6.1–8.1)

## 2015-11-04 LAB — RETICULOCYTES
ABS RETIC: 206.6 10*3/uL — AB (ref 19.0–186.0)
RBC.: 2.87 MIL/uL — ABNORMAL LOW (ref 4.22–5.81)
Retic Ct Pct: 7.2 % — ABNORMAL HIGH (ref 0.4–2.3)

## 2015-11-04 MED ORDER — OXYCODONE-ACETAMINOPHEN 10-325 MG PO TABS: 1.0000 | ORAL_TABLET | ORAL | Status: DC | PRN

## 2015-11-04 NOTE — Patient Instructions (Signed)
Sickle Cell Anemia, Adult Sickle cell anemia is a condition in which red blood cells have an abnormal "sickle" shape. This abnormal shape shortens the cells' life span, which results in a lower than normal concentration of red blood cells in the blood. The sickle shape also causes the cells to clump together and block free blood flow through the blood vessels. As a result, the tissues and organs of the body do not receive enough oxygen. Sickle cell anemia causes organ damage and pain and increases the risk of infection. CAUSES  Sickle cell anemia is a genetic disorder. Those who receive two copies of the gene have the condition, and those who receive one copy have the trait. RISK FACTORS The sickle cell gene is most common in people whose families originated in [place]. Other areas of the globe where sickle cell trait occurs include the Mediterranean, [place] and [place], the [place], and the [place].  SIGNS AND SYMPTOMS  Pain, especially in the extremities, back, chest, or abdomen (common). The pain may start suddenly or may develop following an illness, especially if there is dehydration. Pain can also occur due to overexertion or exposure to extreme temperature changes.  Frequent severe bacterial infections, especially certain types of pneumonia and meningitis.  Pain and swelling in the hands and feet.  Decreased activity.   Loss of appetite.   Change in behavior.  Headaches.  Seizures.  Shortness of breath or difficulty breathing.  Vision changes.  Skin ulcers. Those with the trait may not have symptoms or they may have mild symptoms.  DIAGNOSIS  Sickle cell anemia is diagnosed with blood tests that demonstrate the genetic trait. It is often diagnosed during the newborn period, due to mandatory testing nationwide. A variety of blood tests, X-rays, CT scans, MRI scans, ultrasounds, and lung function tests may also be done to monitor the condition. TREATMENT  Sickle  cell anemia may be treated with:  Medicines. You may be given pain medicines, antibiotic medicines (to treat and prevent infections) or medicines to increase the production of certain types of hemoglobin.  Fluids.  Oxygen.  Blood transfusions. HOME CARE INSTRUCTIONS   Drink enough fluid to keep your urine clear or pale yellow. Increase your fluid intake in hot weather and during exercise.  Do not smoke. Smoking lowers oxygen levels in the blood.   Only take over-the-counter or prescription medicines for pain, fever, or discomfort as directed by your health care provider.  Take antibiotics as directed by your health care provider. Make sure you finish them it even if you start to feel better.   Take supplements as directed by your health care provider.   Consider wearing a medical alert bracelet. This tells anyone caring for you in an emergency of your condition.   When traveling, keep your medical information, health care provider's names, and the medicines you take with you at all times.   If you develop a fever, do not take medicines to reduce the fever right away. This could cover up a problem that is developing. Notify your health care provider.  Keep all follow-up appointments with your health care provider. Sickle cell anemia requires regular medical care. SEEK MEDICAL CARE IF: You have a fever. SEEK IMMEDIATE MEDICAL CARE IF:   You feel dizzy or faint.   You have new abdominal pain, especially on the left side near the stomach area.   You develop a persistent, often uncomfortable and painful penile erection (priapism). If this is not treated immediately it   will lead to impotence.   You have numbness your arms or legs or you have a hard time moving them.   You have a hard time with speech.   You have a fever or persistent symptoms for more than 2-3 days.   You have a fever and your symptoms suddenly get worse.   You have signs or symptoms of infection.  These include:   Chills.   Abnormal tiredness (lethargy).   Irritability.   Poor eating.   Vomiting.   You develop pain that is not helped with medicine.   You develop shortness of breath.  You have pain in your chest.   You are coughing up pus-like or bloody sputum.   You develop a stiff neck.  Your feet or hands swell or have pain.  Your abdomen appears bloated.  You develop joint pain. MAKE SURE YOU:  Understand these instructions.   This information is not intended to replace advice given to you by your health care provider. Make sure you discuss any questions you have with your health care provider.   Document Released: 11/09/2005 Document Revised: 08/22/2014 Document Reviewed: 03/13/2013 Elsevier Interactive Patient Education 2016 [employer]  

## 2015-11-04 NOTE — Progress Notes (Signed)
Subjective:    Patient ID: [name], male    DOB: 1981-07-02, 35 y.o.   MRN: [id number]  HPI [name], a 35 year old male with a history of sickle cell anemia, HbSS presents for a 1 month follow up of sickle cell anemia. Mr Lammie states that he feels well and is without current complaint. He is taking medications consistently.  He reports that he had pain in his lower back this am that was consistent with typical sickle cell pain and took Percocet 10-325 mg with maximum relief. . His current pain intensity is 1-2/10. He maintains that crisis resolved after rest, hydration, and opiate medications. He denies fatigue, shortness of breath, chest pains, nausea, vomiting, diarrhea, or constipation. Past Medical History  Diagnosis Date  . Sickle cell anemia (HCC)    Immunization History  Administered Date(s) Administered  . Influenza,inj,Quad PF,36+ Mos 07/03/2014  . Pneumococcal Conjugate-13 01/01/2015  . Tdap 08/26/2013  No Known Allergies Social History   Social History  . Marital Status: Single    Spouse Name: N/A  . Number of Children: N/A  . Years of Education: N/A   Occupational History  . Not on file.   Social History Main Topics  . Smoking status: Never Smoker   . Smokeless tobacco: Not on file  . Alcohol Use: No  . Drug Use: No  . Sexual Activity: Yes   Other Topics Concern  . Not on file   Social History Narrative   Immunization History  Administered Date(s) Administered  . Influenza,inj,Quad PF,36+ Mos 07/03/2014  . Pneumococcal Conjugate-13 01/01/2015  . Tdap 08/26/2013   Review of Systems  Constitutional: Negative.  Negative for fatigue.  HENT: Negative.   Eyes: Negative.   Respiratory: Negative.   Cardiovascular: Negative.  Negative for chest pain, palpitations and leg swelling.  Gastrointestinal: Negative.   Endocrine: Negative for polydipsia, polyphagia and polyuria.  Musculoskeletal: Positive for myalgias.  Skin: Negative.    Allergic/Immunologic: Negative.   Neurological: Negative.  Negative for dizziness.  Hematological: Negative.   Psychiatric/Behavioral: Negative.        Objective:   Physical Exam  Constitutional: He is oriented to person, place, and time. He appears well-developed.  HENT:  Head: Normocephalic and atraumatic.  Right Ear: External ear normal.  Left Ear: External ear normal.  Mouth/Throat: Oropharynx is clear and moist.  Eyes: Conjunctivae and EOM are normal. Pupils are equal, round, and reactive to light.  Neck: Normal range of motion. Neck supple.  Cardiovascular: Normal rate, regular rhythm, normal heart sounds and intact distal pulses.   Pulmonary/Chest: Effort normal and breath sounds normal.  Abdominal: Soft. Bowel sounds are normal.  Musculoskeletal: Normal range of motion.  Neurological: He is alert and oriented to person, place, and time. He has normal reflexes.  Skin: Skin is warm and dry.  Psychiatric: He has a normal mood and affect. His behavior is normal. Judgment and thought content normal.    BP 117/71 mmHg  Pulse 62  Temp(Src) 97.5 F (36.4 C) (Oral)  Resp 14  Ht 6\' 1"  (1.854 m)  Wt 146 lb (66.225 kg)  BMI 19.27 kg/m2  SpO2 98%    Assessment & Plan:  1. Hb-SS disease without crisis ([place]) Sickle cell disease - Continue Hydrea at current dosage. Will check ANC, platelet count, and hemoglobin today. We discussed the need for good hydration, monitoring of hydration status, avoidance of heat, cold, stress, and infection triggers. We discussed the risks and benefits of Hydrea, including  bone marrow suppression, the possibility of GI upset, skin ulcers, hair thinning, and teratogenicity. The patient was reminded of the need to seek medical attention of any symptoms of bleeding, anemia, or infection. Continue folic acid 1 mg daily to prevent aplastic bone marrow crises.    Pulmonary evaluation - Patient denies severe recurrent wheezes, shortness of breath with  exercise, or persistent cough. If these symptoms develop, pulmonary function tests with spirometry will be ordered, and if abnormal, plan on referral to Pulmonology for further evaluation.  Cardiac - Routine screening for pulmonary hypertension is not recommended.  Eye - High risk of proliferative retinopathy. Annual eye exam with retinal exam recommended to patient. Last exam 1 year ago.   Immunization status - Patient is up to date with immunizations   Acute and chronic painful episodes - We agreed on his current medication dosage. We discussed that pt is to receive his Schedule II prescriptions only from [place]. Pt is also aware that the prescription history is available to [place] online through the [facility] CSRS. Controlled substance agreement signed previously.  We reminded Mr. [name] that all patients receiving Schedule II narcotics must be seen for follow within one month of prescription being requested. We reviewed the terms of our pain agreement, including the need to keep medicines in a safe locked location away from children or pets, and the need to report excess sedation or constipation, measures to avoid constipation, and policies related to early refills and stolen prescriptions. According to the [place] Chronic Pain Initiative program, we have reviewed details related to analgesia, adverse effects, aberrant behaviors. Reviewed [place] Substance Reporting system prior to prescribing opiate pain medications, no inconsistencies noted.   - POCT urinalysis dipstick - CBC with Differential - Reticulocytes - oxyCODONE-acetaminophen (PERCOCET) 10-325 MG tablet; Take 1 tablet by mouth every 4 (four) hours as needed for pain.  Dispense: 90 tablet; Refill: 0 - COMPLETE METABOLIC PANEL WITH GFR - Prescription Monitoring Profile (17)-[facility]  2. Chronic pain  - oxyCODONE-acetaminophen (PERCOCET) 10-325 MG tablet; Take 1 tablet by mouth every 4 (four) hours as needed for pain.  Dispense: 90 tablet; Refill: 0 -  Prescription Monitoring Profile (17)-[facility]     RTC: 1 month for medication management.   The patient was given clear instructions to go to ER or return to medical center if symptoms do not improve, worsen or new problems develop. The patient verbalized understanding. Will notify patient with laboratory results. Dorena Dew, FNP

## 2015-11-10 LAB — OPIATES/OPIOIDS (LC/MS-MS)
Codeine Urine: NEGATIVE ng/mL (ref <50)
HYDROCODONE: NEGATIVE ng/mL (ref <50)
HYDROMORPHONE: NEGATIVE ng/mL (ref <50)
Morphine Urine: NEGATIVE ng/mL (ref <50)
Norhydrocodone, Ur: NEGATIVE ng/mL (ref <50)
Noroxycodone, Ur: 5986 ng/mL — AB (ref <50)
Oxycodone, ur: 2122 ng/mL — AB (ref <50)
Oxymorphone: 2422 ng/mL — AB (ref <50)

## 2015-11-10 LAB — PRESCRIPTION MONITORING PROFILE (SOLSTAS)
AMPHETAMINE/METH: NEGATIVE ng/mL
BARBITURATE SCREEN, URINE: NEGATIVE ng/mL
Benzodiazepine Screen, Urine: NEGATIVE ng/mL
Buprenorphine, Urine: NEGATIVE ng/mL
CANNABINOID SCRN UR: NEGATIVE ng/mL
COCAINE METABOLITES: NEGATIVE ng/mL
Carisoprodol, Urine: NEGATIVE ng/mL
Creatinine, Urine: 158.19 mg/dL (ref >20.0)
Fentanyl, Ur: NEGATIVE ng/mL
MDMA URINE: NEGATIVE ng/mL
Meperidine, Ur: NEGATIVE ng/mL
Methadone Screen, Urine: NEGATIVE ng/mL
Nitrites, Initial: NEGATIVE ug/mL
PROPOXYPHENE: NEGATIVE ng/mL
TRAMADOL UR: NEGATIVE ng/mL
Tapentadol, urine: NEGATIVE ng/mL
ZOLPIDEM, URINE: NEGATIVE ng/mL
pH, Initial: 5.4 pH (ref 4.5–8.9)

## 2015-11-10 LAB — OXYCODONE, URINE (LC/MS-MS)
NOROXYCODONE, UR: 5986 ng/mL — AB (ref <50)
OXYCODONE, UR: 2122 ng/mL — AB (ref <50)
OXYMORPHONE, URINE: 2422 ng/mL — AB (ref <50)

## 2015-12-08 ENCOUNTER — Encounter: Payer: Self-pay | Admitting: Internal Medicine

## 2015-12-08 ENCOUNTER — Ambulatory Visit (INDEPENDENT_AMBULATORY_CARE_PROVIDER_SITE_OTHER): Payer: Medicare Other | Admitting: Internal Medicine

## 2015-12-08 VITALS — BP 112/69 | HR 83 | Temp 97.9°F | Resp 20 | Ht 73.0 in | Wt 144.0 lb

## 2015-12-08 DIAGNOSIS — D571 Sickle-cell disease without crisis: Secondary | ICD-10-CM

## 2015-12-08 DIAGNOSIS — G894 Chronic pain syndrome: Secondary | ICD-10-CM | POA: Diagnosis not present

## 2015-12-08 MED ORDER — OXYCODONE-ACETAMINOPHEN 10-325 MG PO TABS: 1.0000 | ORAL_TABLET | ORAL | Status: DC | PRN

## 2015-12-08 NOTE — Progress Notes (Signed)
Patient ID: [name], male   DOB: 1980-09-15, 35 y.o.   MRN: [id number]   [name], is a 35 y.o. male  [id number]  [id number]  DOB - [birth year]  Chief Complaint  Patient presents with  . Follow-up        Subjective:   [name] is a 35 y.o. male with history of sickle cell anemia here today for a routine follow up visit. He currently feels well with no significant complaint today. He takes his medicine medication consistently, he needs refill on his Percocet. He has not had any crisis since January 2017. He denies any fatigue, no shortness of breath, no nausea vomiting or diarrhea, no constipation, no urinary symptoms. Patient has No headache, No chest pain, No abdominal pain, No new weakness tingling or numbness, no leg swelling. 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  No problems updated.  ALLERGIES: No Known Allergies  PAST MEDICAL HISTORY: Past Medical History  Diagnosis Date  . Sickle cell anemia ([place])     MEDICATIONS AT HOME: Prior to Admission medications   Medication Sig Start Date End Date Taking? Authorizing Provider  cholecalciferol (VITAMIN D) 1000 UNITS tablet Take 1,000 Units by mouth daily.   Yes Historical Provider, MD  folic acid (FOLVITE) A999333 MCG tablet Take 400 mcg by mouth daily.   Yes Historical Provider, MD  hydroxyurea (HYDREA) 500 MG capsule TAKE 2 CAPSULES BY MOUTH TWICE A DAY MAY TAKE WITH FOOD TO MINIMIZE GI SIDE EFFECTS Patient taking differently: Take 2 capsules by mouth twice a day. May take with food to minimize GI side effects. 08/21/15  Yes Dorena Dew, FNP  oxyCODONE-acetaminophen (PERCOCET) 10-325 MG tablet Take 1 tablet by mouth every 4 (four) hours as needed for pain. 12/08/15  Yes Tresa Garter, MD     Objective:   Filed Vitals:   12/08/15 1116  BP: 112/69  Pulse: 83  Temp: 97.9 F (36.6 C)  TempSrc:  Oral  Resp: 20  Height: 6\' 1"  (1.854 m)  Weight: 144 lb (65.318 kg)  SpO2: 98%    Exam General appearance : Awake, alert, not in any distress. Speech Clear. Not toxic looking HEENT: Atraumatic and Normocephalic, pupils equally reactive to light and accomodation Neck: supple, no JVD. No cervical lymphadenopathy.  Chest:Good air entry bilaterally, no added sounds  CVS: S1 S2 regular, no murmurs.  Abdomen: Bowel sounds present, Non tender and not distended with no gaurding, rigidity or rebound. Extremities: B/L Lower Ext shows no edema, both legs are warm to touch Neurology: Awake alert, and oriented X 3, CN II-XII intact, Non focal  Data Review No results found for: HGBA1C   Assessment & Plan   1. Hb-SS disease without crisis ([place])  - oxyCODONE-acetaminophen (PERCOCET) 10-325 MG tablet; Take 1 tablet by mouth every 4 (four) hours as needed for pain.  Dispense: 90 tablet; Refill: 0  2. Chronic pain syndrome  Patient have been counseled extensively about nutrition and exercise  Return in about 2 months (around 02/07/2016) for Sickle Cell Disease/Pain.  The patient was given clear instructions to go to ER or return to medical center if symptoms don't improve, worsen or new problems develop. The patient verbalized understanding. The patient was told to call to get lab results if they haven't heard anything in the next week.   This note has been created with Museum/gallery curator and smart phrase  technology. Any transcriptional errors are unintentional.    Angelica Chessman, MD, [place], Karilyn Cota, [place] and [facility] [place], [place]   12/08/2015, 11:45 AM

## 2015-12-08 NOTE — Progress Notes (Signed)
Patient is here for FU  Patient denies pain at this time.  Patient has taken medications this morning.

## 2015-12-08 NOTE — Patient Instructions (Signed)
Sickle Cell Anemia, Adult Sickle cell anemia is a condition in which red blood cells have an abnormal "sickle" shape. This abnormal shape shortens the cells' life span, which results in a lower than normal concentration of red blood cells in the blood. The sickle shape also causes the cells to clump together and block free blood flow through the blood vessels. As a result, the tissues and organs of the body do not receive enough oxygen. Sickle cell anemia causes organ damage and pain and increases the risk of infection. CAUSES  Sickle cell anemia is a genetic disorder. Those who receive two copies of the gene have the condition, and those who receive one copy have the trait. RISK FACTORS The sickle cell gene is most common in people whose families originated in [place]. Other areas of the globe where sickle cell trait occurs include the Mediterranean, [place] and [place], the [place], and the [place].  SIGNS AND SYMPTOMS  Pain, especially in the extremities, back, chest, or abdomen (common). The pain may start suddenly or may develop following an illness, especially if there is dehydration. Pain can also occur due to overexertion or exposure to extreme temperature changes.  Frequent severe bacterial infections, especially certain types of pneumonia and meningitis.  Pain and swelling in the hands and feet.  Decreased activity.   Loss of appetite.   Change in behavior.  Headaches.  Seizures.  Shortness of breath or difficulty breathing.  Vision changes.  Skin ulcers. Those with the trait may not have symptoms or they may have mild symptoms.  DIAGNOSIS  Sickle cell anemia is diagnosed with blood tests that demonstrate the genetic trait. It is often diagnosed during the newborn period, due to mandatory testing nationwide. A variety of blood tests, X-rays, CT scans, MRI scans, ultrasounds, and lung function tests may also be done to monitor the condition. TREATMENT  Sickle  cell anemia may be treated with:  Medicines. You may be given pain medicines, antibiotic medicines (to treat and prevent infections) or medicines to increase the production of certain types of hemoglobin.  Fluids.  Oxygen.  Blood transfusions. HOME CARE INSTRUCTIONS   Drink enough fluid to keep your urine clear or pale yellow. Increase your fluid intake in hot weather and during exercise.  Do not smoke. Smoking lowers oxygen levels in the blood.   Only take over-the-counter or prescription medicines for pain, fever, or discomfort as directed by your health care provider.  Take antibiotics as directed by your health care provider. Make sure you finish them it even if you start to feel better.   Take supplements as directed by your health care provider.   Consider wearing a medical alert bracelet. This tells anyone caring for you in an emergency of your condition.   When traveling, keep your medical information, health care provider's names, and the medicines you take with you at all times.   If you develop a fever, do not take medicines to reduce the fever right away. This could cover up a problem that is developing. Notify your health care provider.  Keep all follow-up appointments with your health care provider. Sickle cell anemia requires regular medical care. SEEK MEDICAL CARE IF: You have a fever. SEEK IMMEDIATE MEDICAL CARE IF:   You feel dizzy or faint.   You have new abdominal pain, especially on the left side near the stomach area.   You develop a persistent, often uncomfortable and painful penile erection (priapism). If this is not treated immediately it   will lead to impotence.   You have numbness your arms or legs or you have a hard time moving them.   You have a hard time with speech.   You have a fever or persistent symptoms for more than 2-3 days.   You have a fever and your symptoms suddenly get worse.   You have signs or symptoms of infection.  These include:   Chills.   Abnormal tiredness (lethargy).   Irritability.   Poor eating.   Vomiting.   You develop pain that is not helped with medicine.   You develop shortness of breath.  You have pain in your chest.   You are coughing up pus-like or bloody sputum.   You develop a stiff neck.  Your feet or hands swell or have pain.  Your abdomen appears bloated.  You develop joint pain. MAKE SURE YOU:  Understand these instructions.   This information is not intended to replace advice given to you by your health care provider. Make sure you discuss any questions you have with your health care provider.   Document Released: 11/09/2005 Document Revised: 08/22/2014 Document Reviewed: 03/13/2013 Elsevier Interactive Patient Education 2016 [employer]  

## 2016-01-07 ENCOUNTER — Telehealth: Payer: Self-pay

## 2016-01-07 NOTE — Telephone Encounter (Signed)
Pt is requesting a medication refill on his Percocet, 10-325. Thanks!

## 2016-01-07 NOTE — Telephone Encounter (Signed)
Refill request for percocet 10/325mg . LOV 12/08/2015. Please advise. Thanks!

## 2016-01-08 ENCOUNTER — Other Ambulatory Visit: Payer: Self-pay | Admitting: Internal Medicine

## 2016-01-08 DIAGNOSIS — D571 Sickle-cell disease without crisis: Secondary | ICD-10-CM

## 2016-01-08 MED ORDER — OXYCODONE-ACETAMINOPHEN 10-325 MG PO TABS: 1.0000 | ORAL_TABLET | ORAL | Status: DC | PRN

## 2016-02-09 ENCOUNTER — Encounter: Payer: Self-pay | Admitting: Internal Medicine

## 2016-02-09 ENCOUNTER — Ambulatory Visit (INDEPENDENT_AMBULATORY_CARE_PROVIDER_SITE_OTHER): Payer: Medicare Other | Admitting: Internal Medicine

## 2016-02-09 VITALS — BP 103/56 | HR 67 | Temp 98.7°F | Resp 18 | Ht 73.0 in | Wt 144.0 lb

## 2016-02-09 DIAGNOSIS — D571 Sickle-cell disease without crisis: Secondary | ICD-10-CM

## 2016-02-09 DIAGNOSIS — Z113 Encounter for screening for infections with a predominantly sexual mode of transmission: Secondary | ICD-10-CM | POA: Diagnosis not present

## 2016-02-09 LAB — CBC WITH DIFFERENTIAL/PLATELET
BASOS PCT: 0 %
Basophils Absolute: 0 cells/uL (ref 0–200)
Eosinophils Absolute: 222 cells/uL (ref 15–500)
Eosinophils Relative: 3 %
HEMATOCRIT: 27.4 % — AB (ref 38.5–50.0)
HEMOGLOBIN: 9.3 g/dL — AB (ref 13.2–17.1)
LYMPHS ABS: 1924 {cells}/uL (ref 850–3900)
Lymphocytes Relative: 26 %
MCH: 32 pg (ref 27.0–33.0)
MCHC: 33.9 g/dL (ref 32.0–36.0)
MCV: 94.2 fL (ref 80.0–100.0)
MONO ABS: 592 {cells}/uL (ref 200–950)
MPV: 8.6 fL (ref 7.5–12.5)
Monocytes Relative: 8 %
NEUTROS PCT: 63 %
Neutro Abs: 4662 cells/uL (ref 1500–7800)
Platelets: 489 10*3/uL — ABNORMAL HIGH (ref 140–400)
RBC: 2.91 MIL/uL — AB (ref 4.20–5.80)
RDW: 19.9 % — ABNORMAL HIGH (ref 11.0–15.0)
WBC: 7.4 10*3/uL (ref 3.8–10.8)

## 2016-02-09 MED ORDER — OXYCODONE-ACETAMINOPHEN 10-325 MG PO TABS: 1.0000 | ORAL_TABLET | ORAL | Status: DC | PRN

## 2016-02-09 NOTE — Progress Notes (Signed)
Patient ID: [name], male   DOB: 13-Jun-1981, 35 y.o.   MRN: [id number]   [name], is a 35 y.o. male  [id number]  [id number]  DOB - 02-16-1981  Chief Complaint  Patient presents with  . Follow-up        Subjective:   [name] is a 35 y.o. male with history of sickle cell anemia here today for a routine follow up visit. He has no new complaint today, he is taking his medications regularly, including hydroxyurea and folic acid, staying hydrated all the time. He has pain occasionally, amenable to pain medications. He does not have pain today. He is up to date with his immunizations. He will like to be tested for HIV today, just routine check. He denies risky sexual behavior. Patient has No headache, No chest pain, No abdominal pain - No Nausea, No new weakness tingling or numbness, No Cough - SOB.  No problems updated.  ALLERGIES: No Known Allergies  PAST MEDICAL HISTORY: Past Medical History  Diagnosis Date  . Sickle cell anemia ([place])     MEDICATIONS AT HOME: Prior to Admission medications   Medication Sig Start Date End Date Taking? Authorizing Provider  cholecalciferol (VITAMIN D) 1000 UNITS tablet Take 1,000 Units by mouth daily.   Yes Historical Provider, MD  folic acid (FOLVITE) A999333 MCG tablet Take 400 mcg by mouth daily.   Yes Historical Provider, MD  hydroxyurea (HYDREA) 500 MG capsule TAKE 2 CAPSULES BY MOUTH TWICE A DAY MAY TAKE WITH FOOD TO MINIMIZE GI SIDE EFFECTS Patient taking differently: Take 2 capsules by mouth twice a day. May take with food to minimize GI side effects. 08/21/15  Yes Dorena Dew, FNP  oxyCODONE-acetaminophen (PERCOCET) 10-325 MG tablet Take 1 tablet by mouth every 4 (four) hours as needed for pain. 02/09/16  Yes Tresa Garter, MD     Objective:   Filed Vitals:   02/09/16 1124  BP: 103/56  Pulse: 67  Temp: 98.7 F (37.1 C)  TempSrc: Oral  Resp: 18  Height: 6\' 1"  (1.854 m)  Weight: 144 lb (65.318 kg)   SpO2: 100%    Exam General appearance : Awake, alert, not in any distress. Speech Clear. Not toxic looking HEENT: Atraumatic and Normocephalic, pupils equally reactive to light and accomodation Neck: Supple, no JVD. No cervical lymphadenopathy.  Chest: Good air entry bilaterally, no added sounds  CVS: S1 S2 regular, no murmurs.  Abdomen: Bowel sounds present, Non tender and not distended with no gaurding, rigidity or rebound. Extremities: B/L Lower Ext shows no edema, both legs are warm to touch Neurology: Awake alert, and oriented X 3, CN II-XII intact, Non focal Skin: No Rash  Data Review No results found for: HGBA1C   Assessment & Plan   1. Hb-SS disease without crisis ([place])  Refill - oxyCODONE-acetaminophen (PERCOCET) 10-325 MG tablet; Take 1 tablet by mouth every 4 (four) hours as needed for pain.  Dispense: 90 tablet; Refill: 0 - Hemoglobinopathy evaluation - CBC with Differential/Platelet  2. Screening for STDs (sexually transmitted diseases)  - STD Panel (HBSAG,HIV,RPR) - Hepatitis C Antibody  Patient have been counseled extensively about nutrition and exercise  Return in about 3 months (around 05/11/2016) for Sickle Cell Disease/Pain.  The patient was given clear instructions to go to ER or return to medical center if symptoms don't improve, worsen or new problems develop. The patient verbalized understanding. The patient was told to call to get lab results if they haven't heard anything in  the next week.   This note has been created with Surveyor, quantity. Any transcriptional errors are unintentional.    Angelica Chessman, MD, [place], Karilyn Cota, [place] and [facility] [place], [place]   02/09/2016, 11:45 AM

## 2016-02-09 NOTE — Patient Instructions (Signed)
Sickle Cell Anemia, Adult Sickle cell anemia is a condition in which red blood cells have an abnormal "sickle" shape. This abnormal shape shortens the cells' life span, which results in a lower than normal concentration of red blood cells in the blood. The sickle shape also causes the cells to clump together and block free blood flow through the blood vessels. As a result, the tissues and organs of the body do not receive enough oxygen. Sickle cell anemia causes organ damage and pain and increases the risk of infection. CAUSES  Sickle cell anemia is a genetic disorder. Those who receive two copies of the gene have the condition, and those who receive one copy have the trait. RISK FACTORS The sickle cell gene is most common in people whose families originated in [place]. Other areas of the globe where sickle cell trait occurs include the Mediterranean, [place] and [place], the [place], and the [place].  SIGNS AND SYMPTOMS  Pain, especially in the extremities, back, chest, or abdomen (common). The pain may start suddenly or may develop following an illness, especially if there is dehydration. Pain can also occur due to overexertion or exposure to extreme temperature changes.  Frequent severe bacterial infections, especially certain types of pneumonia and meningitis.  Pain and swelling in the hands and feet.  Decreased activity.   Loss of appetite.   Change in behavior.  Headaches.  Seizures.  Shortness of breath or difficulty breathing.  Vision changes.  Skin ulcers. Those with the trait may not have symptoms or they may have mild symptoms.  DIAGNOSIS  Sickle cell anemia is diagnosed with blood tests that demonstrate the genetic trait. It is often diagnosed during the newborn period, due to mandatory testing nationwide. A variety of blood tests, X-rays, CT scans, MRI scans, ultrasounds, and lung function tests may also be done to monitor the condition. TREATMENT  Sickle  cell anemia may be treated with:  Medicines. You may be given pain medicines, antibiotic medicines (to treat and prevent infections) or medicines to increase the production of certain types of hemoglobin.  Fluids.  Oxygen.  Blood transfusions. HOME CARE INSTRUCTIONS   Drink enough fluid to keep your urine clear or pale yellow. Increase your fluid intake in hot weather and during exercise.  Do not smoke. Smoking lowers oxygen levels in the blood.   Only take over-the-counter or prescription medicines for pain, fever, or discomfort as directed by your health care provider.  Take antibiotics as directed by your health care provider. Make sure you finish them it even if you start to feel better.   Take supplements as directed by your health care provider.   Consider wearing a medical alert bracelet. This tells anyone caring for you in an emergency of your condition.   When traveling, keep your medical information, health care provider's names, and the medicines you take with you at all times.   If you develop a fever, do not take medicines to reduce the fever right away. This could cover up a problem that is developing. Notify your health care provider.  Keep all follow-up appointments with your health care provider. Sickle cell anemia requires regular medical care. SEEK MEDICAL CARE IF: You have a fever. SEEK IMMEDIATE MEDICAL CARE IF:   You feel dizzy or faint.   You have new abdominal pain, especially on the left side near the stomach area.   You develop a persistent, often uncomfortable and painful penile erection (priapism). If this is not treated immediately it   will lead to impotence.   You have numbness your arms or legs or you have a hard time moving them.   You have a hard time with speech.   You have a fever or persistent symptoms for more than 2-3 days.   You have a fever and your symptoms suddenly get worse.   You have signs or symptoms of infection.  These include:   Chills.   Abnormal tiredness (lethargy).   Irritability.   Poor eating.   Vomiting.   You develop pain that is not helped with medicine.   You develop shortness of breath.  You have pain in your chest.   You are coughing up pus-like or bloody sputum.   You develop a stiff neck.  Your feet or hands swell or have pain.  Your abdomen appears bloated.  You develop joint pain. MAKE SURE YOU:  Understand these instructions.   This information is not intended to replace advice given to you by your health care provider. Make sure you discuss any questions you have with your health care provider.   Document Released: 11/09/2005 Document Revised: 08/22/2014 Document Reviewed: 03/13/2013 Elsevier Interactive Patient Education 2016 [employer]  

## 2016-02-10 LAB — STD PANEL
HIV: NONREACTIVE
Hepatitis B Surface Ag: NEGATIVE

## 2016-02-10 LAB — HEPATITIS C ANTIBODY: HCV AB: NEGATIVE

## 2016-02-17 ENCOUNTER — Telehealth: Payer: Self-pay | Admitting: *Deleted

## 2016-02-17 IMAGING — MR MR HEAD W/O CM
8 of 10 series · 37 of 48 positions shown · non-contrast
Comparison: None.

CLINICAL DATA: Initial evaluation for acute headache. History of
sickle cell anemia.

EXAM:
MRI HEAD WITHOUT CONTRAST
TECHNIQUE: Multiplanar, multiecho pulse sequences of the brain and surrounding
structures were obtained without intravenous contrast.

[Series 3: T1 · sagittal · 5.0mm · 0.47mm/px · 1 of 23 slices shown]
[im 1/23]
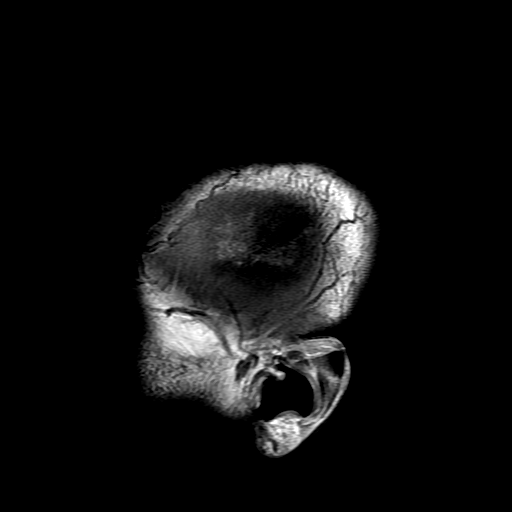

[Series 4: DWI · axial · 3.0mm · 1.09mm/px · z∈[-28,+140]mm · 9 of 114 slices shown (1 of 4)]
[im 1/114]
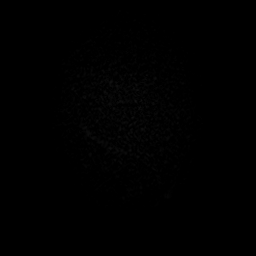
[im 21/114]
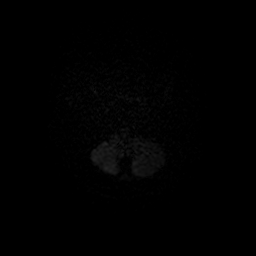
[im 31/114]
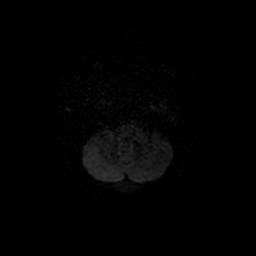
[im 52/114]
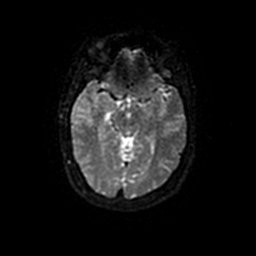
[im 62/114]
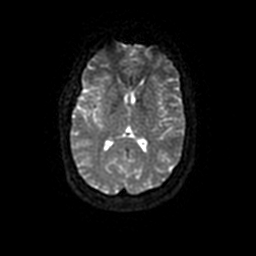
[im 83/114]
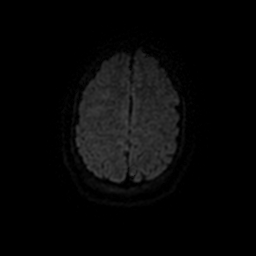
[im 93/114]
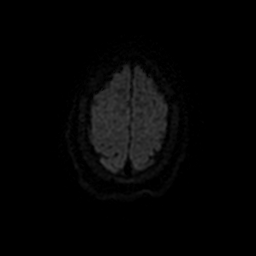
[im 103/114]
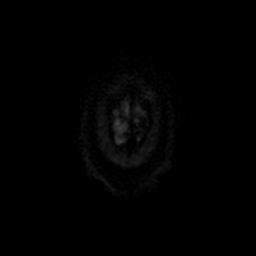
[im 114/114]
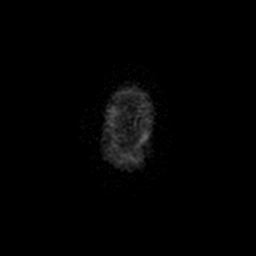

[Series 5: DWI · coronal · 5.0mm · 1.09mm/px · 8 of 78 slices shown (2 of 4)]
[im 1/78]
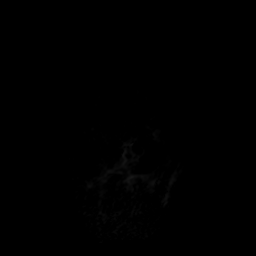
[im 12/78]
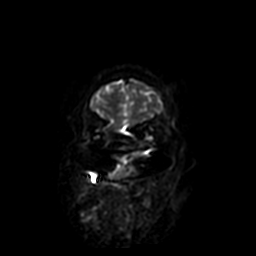
[im 23/78]
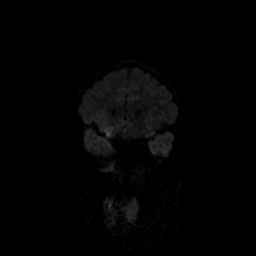
[im 34/78]
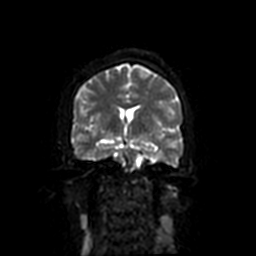
[im 45/78]
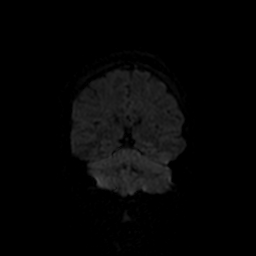
[im 56/78]
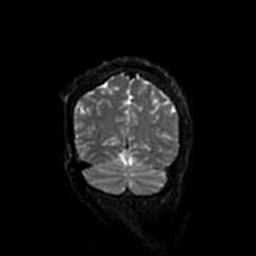
[im 67/78]
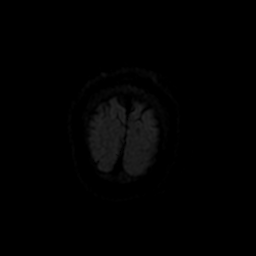
[im 78/78]
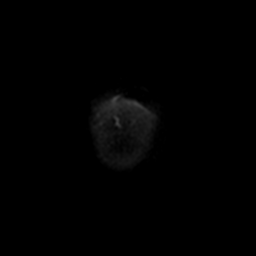

[Series 6: T2 · axial · 5.0mm · 0.43mm/px · z∈[-17,+145]mm · 3 of 26 slices shown (1 of 2)]
[im 1/26]
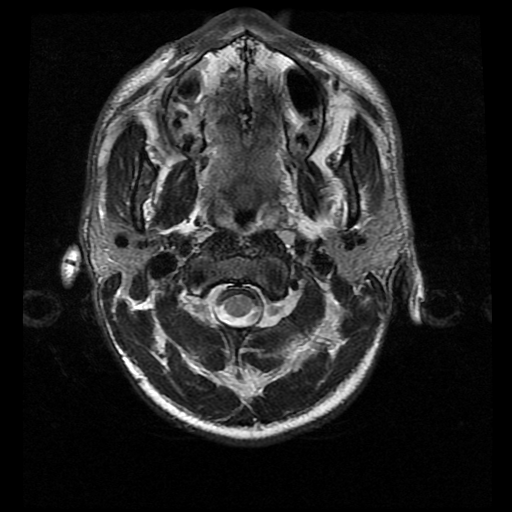
[im 13/26]
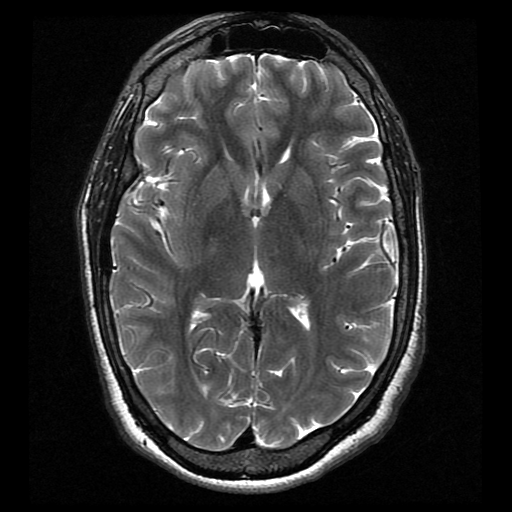
[im 26/26]
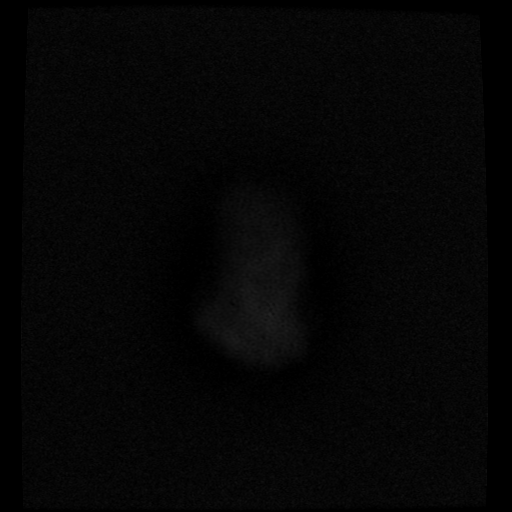

[Series 7: FLAIR · axial · 5.0mm · 0.43mm/px · z∈[-23,+151]mm · 3 of 26 slices shown]
[im 1/26]
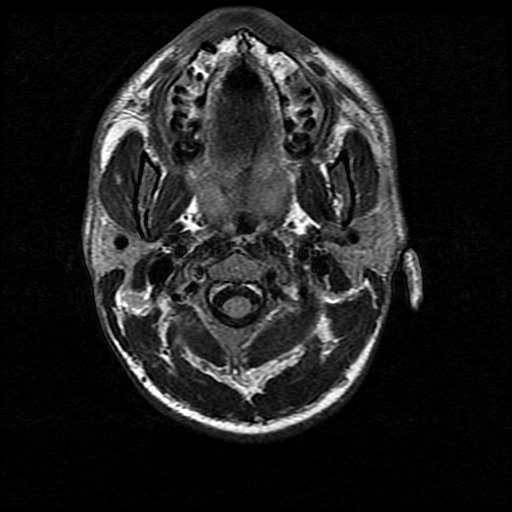
[im 13/26]
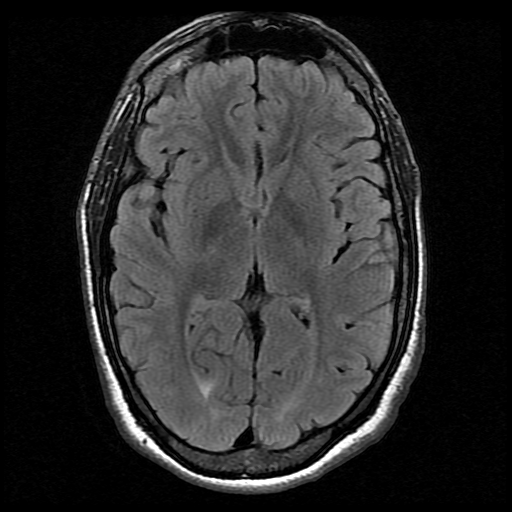
[im 26/26]
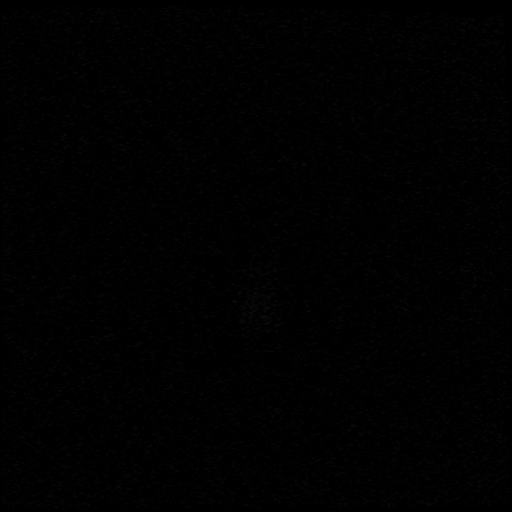

[Series 10: T2 · coronal · 5.0mm · 0.45mm/px · 3 of 30 slices shown (2 of 2)]
[im 1/30]
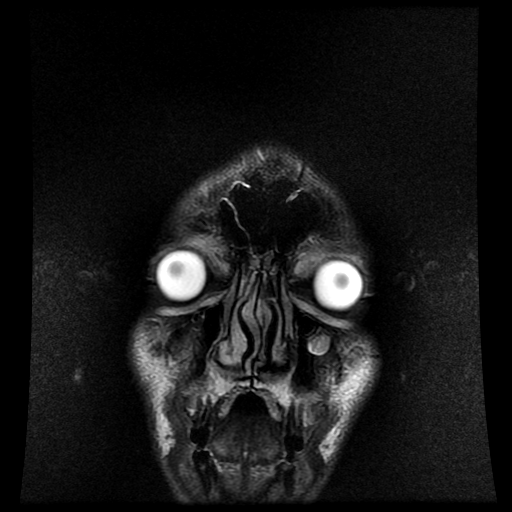
[im 15/30]
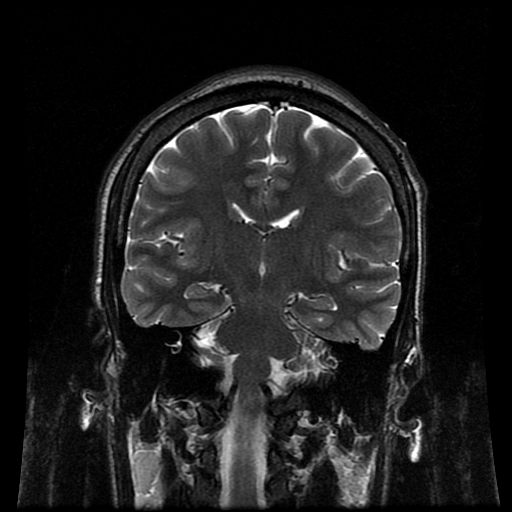
[im 30/30]
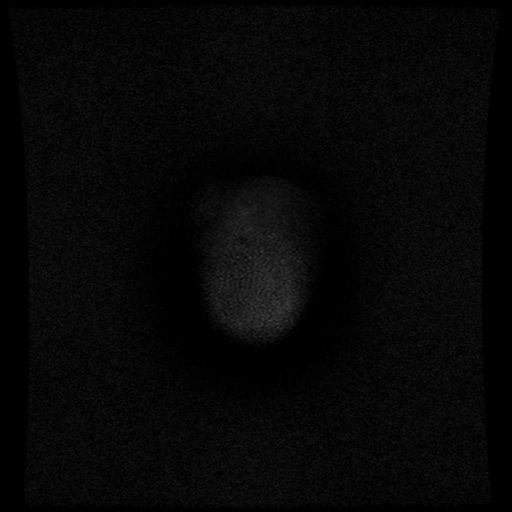

[Series 400: DWI · axial · 3.0mm · 1.09mm/px · z∈[-28,+140]mm · 6 of 57 slices shown (3 of 4)]
[im 1/57]
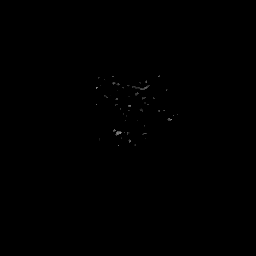
[im 12/57]
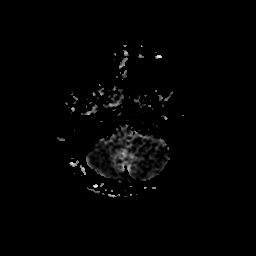
[im 23/57]
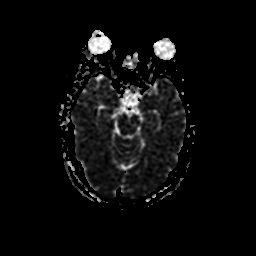
[im 34/57]
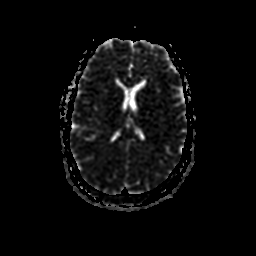
[im 45/57]
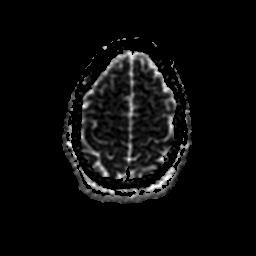
[im 57/57]
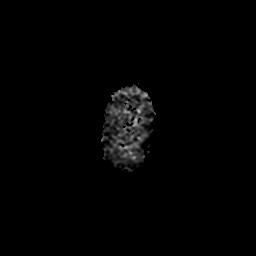

[Series 500: DWI · coronal · 5.0mm · 1.09mm/px · 4 of 39 slices shown (4 of 4)]
[im 1/39]
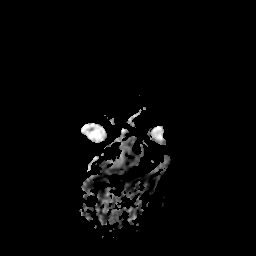
[im 13/39]
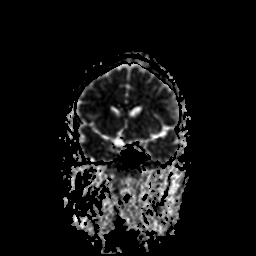
[im 26/39]
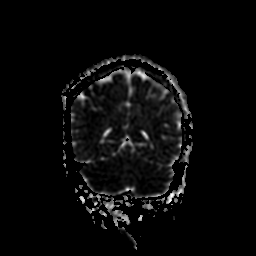
[im 39/39]
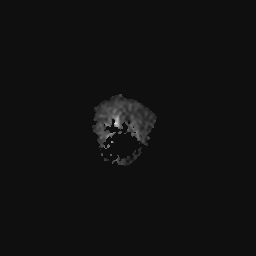

[37 of 48 positions shown; findings below may reference images not displayed]

FINDINGS: Cerebral volume within normal limits for patient age. No significant
white matter disease present.

No foci of restricted diffusion to suggest acute infarct. Gray-white
matter differentiation well maintained. Major intracranial vascular
flow voids are well preserved. No acute or chronic intracranial
hemorrhage. No areas of chronic infarction.

No mass lesion, midline shift, or mass effect. No hydrocephalus. No
extra-axial fluid collection. Major dural sinuses are patent.

Cerebellar tonsillar ectopia of up to 5 mm, slightly greater on the
right, without frank Chiari malformation. Visualized upper cervical
spine within normal limits.

Pituitary gland normal.  No acute abnormality about the orbits.

Few scattered retention cyst noted within the maxillary sinuses.
Mild mucosal thickening within the ethmoidal air cells and maxillary
sinuses. No air-fluid levels to suggest active sinus infection. No
mastoid effusion. Inner ear structures normal.

Mildly decreased T1 signal intensity throughout the visualized bone
marrow, likely related to underlying history of sickle cell anemia.
Visualized osseous structures otherwise unremarkable. No scalp soft
tissue abnormality.
IMPRESSION: 1. No acute intracranial process.
2. Cerebellar tonsillar ectopia up to 5 mm without frank Chiari
malformation. Otherwise normal MRI of the brain.
3. Mild diffuse decrease in T1 signal intensity throughout the
visualized bone marrow, likely related to underlying history of
sickle cell anemia.

## 2016-02-17 NOTE — Telephone Encounter (Signed)
-----   Message from Tresa Garter, MD sent at 02/17/2016 10:22 AM EDT ----- Please inform patient that labs are largely stable. Hemoglobin is stable. HIV and Hepatitis B and C are Negative as of 02/10/2016. RPR for Syphilis is Negative.

## 2016-02-17 NOTE — Telephone Encounter (Signed)
Patient verified DOB Patient is aware of hemiglobin is stable. HIV and HEP are all negative. As well as Syphilis. Patient has no further questions at this time.

## 2016-03-15 ENCOUNTER — Other Ambulatory Visit: Payer: Self-pay | Admitting: *Deleted

## 2016-03-15 DIAGNOSIS — D571 Sickle-cell disease without crisis: Secondary | ICD-10-CM

## 2016-03-15 MED ORDER — OXYCODONE-ACETAMINOPHEN 10-325 MG PO TABS: 1.0000 | ORAL_TABLET | ORAL | 0 refills | Status: DC | PRN

## 2016-03-15 NOTE — Telephone Encounter (Signed)
Patient last seen on 02/09/16 and last filled on 02/09/16.

## 2016-04-19 ENCOUNTER — Telehealth: Payer: Self-pay

## 2016-04-19 ENCOUNTER — Other Ambulatory Visit: Payer: Self-pay | Admitting: Internal Medicine

## 2016-04-19 DIAGNOSIS — D571 Sickle-cell disease without crisis: Secondary | ICD-10-CM

## 2016-04-19 MED ORDER — OXYCODONE-ACETAMINOPHEN 10-325 MG PO TABS: 1.0000 | ORAL_TABLET | ORAL | 0 refills | Status: DC | PRN

## 2016-04-30 ENCOUNTER — Other Ambulatory Visit: Payer: Self-pay | Admitting: Family Medicine

## 2016-05-11 ENCOUNTER — Ambulatory Visit: Payer: Self-pay | Admitting: Family Medicine

## 2016-05-17 ENCOUNTER — Other Ambulatory Visit: Payer: Self-pay | Admitting: Internal Medicine

## 2016-05-17 ENCOUNTER — Telehealth: Payer: Self-pay

## 2016-05-17 DIAGNOSIS — D571 Sickle-cell disease without crisis: Secondary | ICD-10-CM

## 2016-05-17 MED ORDER — OXYCODONE-ACETAMINOPHEN 10-325 MG PO TABS: 1.0000 | ORAL_TABLET | ORAL | 0 refills | Status: DC | PRN

## 2016-05-17 NOTE — Telephone Encounter (Signed)
Refill request for percocet. Please advise. Thanks!

## 2016-05-17 NOTE — Telephone Encounter (Signed)
Refill request for percocet. LOV 02/09/2016. Please advise. Thanks!

## 2016-06-22 ENCOUNTER — Telehealth: Payer: Self-pay

## 2016-06-23 ENCOUNTER — Other Ambulatory Visit: Payer: Self-pay | Admitting: Internal Medicine

## 2016-06-23 DIAGNOSIS — D571 Sickle-cell disease without crisis: Secondary | ICD-10-CM

## 2016-06-23 MED ORDER — OXYCODONE-ACETAMINOPHEN 10-325 MG PO TABS: 1.0000 | ORAL_TABLET | ORAL | 0 refills | Status: DC | PRN

## 2016-07-25 ENCOUNTER — Telehealth: Payer: Self-pay

## 2016-07-26 ENCOUNTER — Other Ambulatory Visit: Payer: Self-pay | Admitting: Internal Medicine

## 2016-07-26 DIAGNOSIS — D571 Sickle-cell disease without crisis: Secondary | ICD-10-CM

## 2016-07-26 MED ORDER — OXYCODONE-ACETAMINOPHEN 10-325 MG PO TABS: 1.0000 | ORAL_TABLET | ORAL | 0 refills | Status: DC | PRN

## 2016-08-18 ENCOUNTER — Telehealth: Payer: Self-pay

## 2016-08-18 ENCOUNTER — Other Ambulatory Visit: Payer: Self-pay | Admitting: Internal Medicine

## 2016-08-18 DIAGNOSIS — D571 Sickle-cell disease without crisis: Secondary | ICD-10-CM

## 2016-08-18 MED ORDER — OXYCODONE-ACETAMINOPHEN 10-325 MG PO TABS: 1.0000 | ORAL_TABLET | ORAL | 0 refills | Status: DC | PRN

## 2016-08-19 ENCOUNTER — Other Ambulatory Visit: Payer: Self-pay | Admitting: Family Medicine

## 2016-08-21 ENCOUNTER — Emergency Department (HOSPITAL_COMMUNITY)
Admission: EM | Admit: 2016-08-21 | Discharge: 2016-08-22 | Disposition: A | Discharge status: Home / Self Care | Payer: Medicare Other | Attending: Emergency Medicine | Admitting: Emergency Medicine

## 2016-08-21 ENCOUNTER — Encounter (HOSPITAL_COMMUNITY): Payer: Self-pay | Admitting: Emergency Medicine

## 2016-08-21 DIAGNOSIS — Z79899 Other long term (current) drug therapy: Secondary | ICD-10-CM | POA: Insufficient documentation

## 2016-08-21 DIAGNOSIS — D571 Sickle-cell disease without crisis: Secondary | ICD-10-CM

## 2016-08-21 DIAGNOSIS — D5700 Hb-SS disease with crisis, unspecified: Secondary | ICD-10-CM | POA: Insufficient documentation

## 2016-08-21 DIAGNOSIS — R0781 Pleurodynia: Secondary | ICD-10-CM | POA: Diagnosis present

## 2016-08-21 LAB — CBC WITH DIFFERENTIAL/PLATELET
Basophils Absolute: 0.1 10*3/uL (ref 0.0–0.1)
Basophils Relative: 0 %
EOS PCT: 2 %
Eosinophils Absolute: 0.3 10*3/uL (ref 0.0–0.7)
HCT: 25.2 % — ABNORMAL LOW (ref 39.0–52.0)
Hemoglobin: 9 g/dL — ABNORMAL LOW (ref 13.0–17.0)
LYMPHS ABS: 4.5 10*3/uL — AB (ref 0.7–4.0)
LYMPHS PCT: 29 %
MCH: 30.9 pg (ref 26.0–34.0)
MCHC: 35.7 g/dL (ref 30.0–36.0)
MCV: 86.6 fL (ref 78.0–100.0)
MONO ABS: 1.2 10*3/uL — AB (ref 0.1–1.0)
Monocytes Relative: 8 %
Neutro Abs: 9.7 10*3/uL — ABNORMAL HIGH (ref 1.7–7.7)
Neutrophils Relative %: 61 %
PLATELETS: 310 10*3/uL (ref 150–400)
RBC: 2.91 MIL/uL — AB (ref 4.22–5.81)
RDW: 17.9 % — ABNORMAL HIGH (ref 11.5–15.5)
WBC: 15.6 10*3/uL — AB (ref 4.0–10.5)

## 2016-08-21 LAB — COMPREHENSIVE METABOLIC PANEL
ALT: 21 U/L (ref 17–63)
AST: 29 U/L (ref 15–41)
Albumin: 4.7 g/dL (ref 3.5–5.0)
Alkaline Phosphatase: 61 U/L (ref 38–126)
Anion gap: 6 (ref 5–15)
BILIRUBIN TOTAL: 2 mg/dL — AB (ref 0.3–1.2)
BUN: 6 mg/dL (ref 6–20)
CO2: 29 mmol/L (ref 22–32)
CREATININE: 0.69 mg/dL (ref 0.61–1.24)
Calcium: 8.9 mg/dL (ref 8.9–10.3)
Chloride: 101 mmol/L (ref 101–111)
GFR calc Af Amer: >60 mL/min (ref >60)
Glucose, Bld: 102 mg/dL — ABNORMAL HIGH (ref 65–99)
POTASSIUM: 4 mmol/L (ref 3.5–5.1)
Sodium: 136 mmol/L (ref 135–145)
TOTAL PROTEIN: 8.1 g/dL (ref 6.5–8.1)

## 2016-08-21 LAB — RETICULOCYTES
RBC.: 2.91 MIL/uL — ABNORMAL LOW (ref 4.22–5.81)
Retic Count, Absolute: 357.9 10*3/uL — ABNORMAL HIGH (ref 19.0–186.0)
Retic Ct Pct: 12.3 % — ABNORMAL HIGH (ref 0.4–3.1)

## 2016-08-21 MED ORDER — DIPHENHYDRAMINE HCL 50 MG/ML IJ SOLN
25.0000 mg | Freq: Once | INTRAMUSCULAR | Status: AC
  Administered 2016-08-21: 25 mg via INTRAVENOUS
  Filled 2016-08-21: qty 1

## 2016-08-21 MED ORDER — HYDROMORPHONE HCL 2 MG/ML IJ SOLN
2.0000 mg | INTRAMUSCULAR | Status: AC
  Administered 2016-08-21: 2 mg via INTRAVENOUS
  Filled 2016-08-21: qty 1

## 2016-08-21 MED ORDER — ONDANSETRON HCL 4 MG/2ML IJ SOLN
4.0000 mg | INTRAMUSCULAR | Status: DC | PRN
  Filled 2016-08-21: qty 2

## 2016-08-21 MED ORDER — HYDROMORPHONE HCL 2 MG/ML IJ SOLN: 2.0000 mg | INTRAMUSCULAR | Status: AC

## 2016-08-21 MED ORDER — HYDROMORPHONE HCL 2 MG/ML IJ SOLN
2.0000 mg | INTRAMUSCULAR | Status: AC
  Filled 2016-08-21: qty 1

## 2016-08-21 MED ORDER — HYDROMORPHONE HCL 1 MG/ML IJ SOLN
0.5000 mg | Freq: Once | INTRAMUSCULAR | Status: AC
  Administered 2016-08-21: 0.5 mg via SUBCUTANEOUS
  Filled 2016-08-21: qty 1

## 2016-08-21 MED ORDER — HYDROMORPHONE HCL 2 MG/ML IJ SOLN: 2.0000 mg | INTRAMUSCULAR | Status: DC

## 2016-08-21 MED ORDER — SODIUM CHLORIDE 0.45 % IV SOLN
INTRAVENOUS | Status: DC
  Administered 2016-08-21: 22:00:00 via INTRAVENOUS

## 2016-08-21 MED ORDER — KETOROLAC TROMETHAMINE 15 MG/ML IJ SOLN
15.0000 mg | INTRAMUSCULAR | Status: AC
  Administered 2016-08-21: 15 mg via INTRAVENOUS
  Filled 2016-08-21: qty 1

## 2016-08-21 MED ORDER — HYDROMORPHONE HCL 2 MG/ML IJ SOLN
2.0000 mg | INTRAMUSCULAR | Status: DC
  Administered 2016-08-22: 2 mg via INTRAVENOUS

## 2016-08-21 NOTE — ED Triage Notes (Signed)
Pt c/o sickle cell crisis onset of this morning. Pt last took percocet at 1500 today. Pain 9/10

## 2016-08-21 NOTE — ED Provider Notes (Signed)
[facility] DEPT Provider Note   CSN: [id number] Arrival date & time: 08/21/16  2029 By signing my name below, I, [name], attest that this documentation has been prepared under the direction and in the presence of non-physician practitioner, Junius Creamer, NP. Electronically Signed: Dyke [name], Scribe. 08/21/2016. 10:07 PM.   History   Chief Complaint No chief complaint on file.   HPI [name] is a 36 y.o. male with a hx of sickle cell anemia who presents to the Emergency Department complaining of constant, moderate pain to his bilateral ribs onset this morning. He states this is a typical site for his pain crises. He notes associated headache, which he does not experience as often during a pain crisis. He currently rates his headache 9/10 in severity. No exacerbating factors noted. Pt has taken percocet with some relief of pain, last taken at about 3 this afternoon. He has since run out of his percocet and can not pick up a refill for two days. Normal PO intake.  He denies any SOB or blurred vision.   The history is provided by the patient. No language interpreter was used.   Past Medical History:  Diagnosis Date  . Sickle cell anemia [facility])     Patient Active Problem List   Diagnosis Date Noted  . Sickle cell pain crisis ([place]) 09/08/2015  . Routine general medical examination at a health care facility 02/11/2015  . Acute chest syndrome ([place]) 10/14/2014  . Hypokalemia 10/14/2014  . Anemia of chronic disease 10/14/2014  . CAP (community acquired pneumonia) 10/12/2014  . Sickle cell crisis ([place]) 10/10/2014  . Screening for STDs (sexually transmitted diseases) 01/09/2014  . Pain of right clavicle 11/14/2013  . Underweight 08/26/2013  . Need for Tdap vaccination 08/26/2013  . Hb-SS disease without crisis ([place]) 08/26/2013  . Sickle cell anemia with crisis ([place]) 08/21/2013  . Chronic pain syndrome 02/18/2013  . Sickle cell anemia ([place]) 01/18/2013  . Pain 01/18/2013     Past Surgical History:  Procedure Laterality Date  . CHOLECYSTECTOMY        Home Medications    Prior to Admission medications   Medication Sig Start Date End Date Taking? Authorizing Provider  folic acid (FOLVITE) A999333 MCG tablet Take 400 mcg by mouth daily.   Yes Historical Provider, MD  hydroxyurea (HYDREA) 500 MG capsule TAKE 2 CAPSULES BY MOUTH TWICE A DAY MAY TAKE WITH FOOD TO MINIMIZE GI SIDE EFFECTS 08/19/16  Yes Micheline Chapman, NP  oxyCODONE-acetaminophen (PERCOCET) 10-325 MG tablet Take 1 tablet by mouth every 4 (four) hours as needed for pain. 08/22/16   Junius Creamer, NP    Family History Family History  Problem Relation Age of Onset  . Sickle cell anemia Mother   . Sickle cell trait Father   . Sickle cell trait Sister   . Sickle cell trait Brother   . Cancer Maternal Grandmother     Social History Social History  Substance Use Topics  . Smoking status: Never Smoker  . Smokeless tobacco: Not on file  . Alcohol use No   Allergies   Patient has no known allergies.  Review of Systems Review of Systems  Eyes: Negative for visual disturbance.  Respiratory: Negative for shortness of breath.   Cardiovascular: Positive for chest pain.  Musculoskeletal: Positive for myalgias.  Neurological: Positive for headaches.  All other systems reviewed and are negative.  Physical Exam Updated Vital Signs BP 112/73   Pulse 79   Temp 97.6 F (36.4 C) (  Oral)   Resp 16   Ht 6\' 1"  (1.854 m)   Wt 65.8 kg   SpO2 94%   BMI 19.13 kg/m   Physical Exam  ED Treatments / Results  DIAGNOSTIC STUDIES:  Oxygen Saturation is 100% on RA, normal by my interpretation.    COORDINATION OF CARE:  10:05 PM Discussed treatment plan with pt at bedside and pt agreed to plan.  Labs (all labs ordered are listed, but only abnormal results are displayed) Labs Reviewed  COMPREHENSIVE METABOLIC PANEL - Abnormal; Notable for the following:       Result Value   Glucose, Bld 102 (*)     Total Bilirubin 2.0 (*)    All other components within normal limits  CBC WITH DIFFERENTIAL/PLATELET - Abnormal; Notable for the following:    WBC 15.6 (*)    RBC 2.91 (*)    Hemoglobin 9.0 (*)    HCT 25.2 (*)    RDW 17.9 (*)    Neutro Abs 9.7 (*)    Lymphs Abs 4.5 (*)    Monocytes Absolute 1.2 (*)    All other components within normal limits  RETICULOCYTES - Abnormal; Notable for the following:    Retic Ct Pct 12.3 (*)    RBC. 2.91 (*)    Retic Count, Manual 357.9 (*)    All other components within normal limits    EKG  EKG Interpretation None       Radiology No results found.  Procedures Procedures (including critical care time)  Medications Ordered in ED Medications  HYDROmorphone (DILAUDID) injection 2 mg (not administered)    Or  HYDROmorphone (DILAUDID) injection 2 mg (not administered)  HYDROmorphone (DILAUDID) injection 0.5 mg (0.5 mg Subcutaneous Given 08/21/16 2051)  ketorolac (TORADOL) 15 MG/ML injection 15 mg (15 mg Intravenous Given 08/21/16 2213)  HYDROmorphone (DILAUDID) injection 2 mg (2 mg Intravenous Given 08/21/16 2214)    Or  HYDROmorphone (DILAUDID) injection 2 mg ( Subcutaneous See Alternative 08/21/16 2214)  HYDROmorphone (DILAUDID) injection 2 mg (2 mg Intravenous Given 08/21/16 2338)    Or  HYDROmorphone (DILAUDID) injection 2 mg ( Subcutaneous See Alternative 08/21/16 2338)  diphenhydrAMINE (BENADRYL) injection 25 mg (25 mg Intravenous Given 08/21/16 2213)     Initial Impression / Assessment and Plan / ED Course  I have reviewed the triage vital signs and the nursing notes.  Pertinent labs & imaging results that were available during my care of the patient were reviewed by me and considered in my medical decision making (see chart for details).  Clinical Course   After fluids and IV pain medicine.  Patient reports that his pain level is down to 10.  He states he can be discharged home, but he has no occasional home to control pain.  He will be  provided with a short course of Percocet with instructions to follow-up with the sickle cell clinic today     Final Clinical Impressions(s) / ED Diagnoses   Final diagnoses:  Sickle cell crisis [facility])    New Prescriptions Discharge Medication List as of 08/22/2016 12:53 AM    I personally performed the services described in this documentation, which was scribed in my presence. The recorded information has been reviewed and is accurate.   Junius Creamer, NP 08/22/16 1951    Quintella Reichert, MD 08/23/16 [phone]

## 2016-08-22 MED ORDER — OXYCODONE-ACETAMINOPHEN 10-325 MG PO TABS: 1.0000 | ORAL_TABLET | ORAL | 0 refills | Status: DC | PRN

## 2016-08-22 NOTE — Discharge Instructions (Signed)
Call your PCP and be seen at the sickle cell clinic today  Make sure to stay hydrated

## 2016-08-22 NOTE — ED Notes (Signed)
Patient states he would like to wait a few more minutes before his next dose of pain medicine.

## 2016-09-08 ENCOUNTER — Ambulatory Visit (INDEPENDENT_AMBULATORY_CARE_PROVIDER_SITE_OTHER): Payer: Medicare Other | Admitting: Family Medicine

## 2016-09-08 ENCOUNTER — Encounter: Payer: Self-pay | Admitting: Family Medicine

## 2016-09-08 VITALS — BP 122/58 | HR 79 | Temp 98.0°F | Resp 14 | Ht 73.0 in | Wt 150.0 lb

## 2016-09-08 DIAGNOSIS — Z114 Encounter for screening for human immunodeficiency virus [HIV]: Secondary | ICD-10-CM

## 2016-09-08 DIAGNOSIS — D571 Sickle-cell disease without crisis: Secondary | ICD-10-CM | POA: Diagnosis not present

## 2016-09-08 DIAGNOSIS — Z79891 Long term (current) use of opiate analgesic: Secondary | ICD-10-CM

## 2016-09-08 LAB — POCT URINALYSIS DIP (DEVICE)
BILIRUBIN URINE: NEGATIVE
GLUCOSE, UA: NEGATIVE mg/dL
KETONES UR: NEGATIVE mg/dL
Leukocytes, UA: NEGATIVE
NITRITE: NEGATIVE
PROTEIN: NEGATIVE mg/dL
Specific Gravity, Urine: 1.01 (ref 1.005–1.030)
Urobilinogen, UA: 1 mg/dL (ref 0.0–1.0)
pH: 6 (ref 5.0–8.0)

## 2016-09-08 LAB — COMPLETE METABOLIC PANEL WITH GFR
ALT: 13 U/L (ref 9–46)
AST: 23 U/L (ref 10–40)
Albumin: 4.2 g/dL (ref 3.6–5.1)
Alkaline Phosphatase: 62 U/L (ref 40–115)
BILIRUBIN TOTAL: 1.6 mg/dL — AB (ref 0.2–1.2)
BUN: 7 mg/dL (ref 7–25)
CHLORIDE: 105 mmol/L (ref 98–110)
CO2: 24 mmol/L (ref 20–31)
CREATININE: 0.69 mg/dL (ref 0.60–1.35)
Calcium: 9.6 mg/dL (ref 8.6–10.3)
GFR, Est Non African American: >89 mL/min (ref >=60)
GLUCOSE: 81 mg/dL (ref 65–99)
Potassium: 4.7 mmol/L (ref 3.5–5.3)
Sodium: 141 mmol/L (ref 135–146)
TOTAL PROTEIN: 7.8 g/dL (ref 6.1–8.1)

## 2016-09-08 MED ORDER — OXYCODONE-ACETAMINOPHEN 10-325 MG PO TABS: 1.0000 | ORAL_TABLET | ORAL | 0 refills | Status: DC | PRN

## 2016-09-08 NOTE — Progress Notes (Signed)
Subjective:    Patient ID: [name], male    DOB: 1981/02/21, 36 y.o.   MRN: [id number]  HPI [name], a 36 year old male with a history of sickle cell anemia, HbSS presents for a 1 month follow up of sickle cell anemia. [name] states that he feels well and is without current complaint. He is taking medications consistently.  He reports that he had pain in his lower back this am that was consistent with typical sickle cell pain and took Percocet 10-325 mg with maximum relief. . His current pain intensity is 1-2/10. He maintains that sickle cell crises typically resolve with rest, hydration, and opiate medications. He denies fatigue, shortness of breath, chest pains, nausea, vomiting, diarrhea, or constipation. Past Medical History:  Diagnosis Date  . Sickle cell anemia (HCC)    Immunization History  Administered Date(s) Administered  . Influenza,inj,Quad PF,36+ Mos 07/03/2014  . Pneumococcal Conjugate-13 01/01/2015  . Tdap 08/26/2013  No Known Allergies Social History   Social History  . Marital status: Single    Spouse name: N/A  . Number of children: N/A  . Years of education: N/A   Occupational History  . Not on file.   Social History Main Topics  . Smoking status: Never Smoker  . Smokeless tobacco: Not on file  . Alcohol use No  . Drug use: No  . Sexual activity: Yes   Other Topics Concern  . Not on file   Social History Narrative  . No narrative on file   Immunization History  Administered Date(s) Administered  . Influenza,inj,Quad PF,36+ Mos 07/03/2014  . Pneumococcal Conjugate-13 01/01/2015  . Tdap 08/26/2013   Review of Systems  Constitutional: Negative.  Negative for fatigue.  HENT: Negative.   Eyes: Negative.   Respiratory: Negative.   Cardiovascular: Negative.  Negative for chest pain, palpitations and leg swelling.  Gastrointestinal: Negative.   Endocrine: Negative for polydipsia, polyphagia and polyuria.  Musculoskeletal: Positive  for myalgias.  Skin: Negative.   Allergic/Immunologic: Negative.   Neurological: Negative.  Negative for dizziness.  Hematological: Negative.   Psychiatric/Behavioral: Negative.        Objective:   Physical Exam  Constitutional: He is oriented to person, place, and time. He appears well-developed.  HENT:  Head: Normocephalic and atraumatic.  Right Ear: External ear normal.  Left Ear: External ear normal.  Mouth/Throat: Oropharynx is clear and moist.  Eyes: Conjunctivae and EOM are normal. Pupils are equal, round, and reactive to light.  Neck: Normal range of motion. Neck supple.  Cardiovascular: Normal rate, regular rhythm, normal heart sounds and intact distal pulses.   Pulmonary/Chest: Effort normal and breath sounds normal.  Abdominal: Soft. Bowel sounds are normal.  Musculoskeletal: Normal range of motion.  Neurological: He is alert and oriented to person, place, and time. He has normal reflexes.  Skin: Skin is warm and dry.  Psychiatric: He has a normal mood and affect. His behavior is normal. Judgment and thought content normal.    BP (!) 122/58 (BP Location: Left Arm, Patient Position: Sitting, Cuff Size: Normal)   Pulse 79   Temp 98 F (36.7 C) (Oral)   Resp 14   Ht 6\' 1"  (1.854 m)   Wt 150 lb (68 kg)   SpO2 99%   BMI 19.79 kg/m     Assessment & Plan:  1. Hb-SS disease without crisis ([place]) Sickle cell disease - Continue Hydrea at current dosage. Will check ANC, platelet count, and hemoglobin today. We discussed  the need for good hydration, monitoring of hydration status, avoidance of heat, cold, stress, and infection triggers. We discussed the risks and benefits of Hydrea, including bone marrow suppression, the possibility of GI upset, skin ulcers, hair thinning, and teratogenicity. The patient was reminded of the need to seek medical attention of any symptoms of bleeding, anemia, or infection. Continue folic acid 1 mg daily to prevent aplastic bone marrow crises.    We discussed in detail that hydroxyurea has been found to be effective in reducing VOC, transfusions, and admissions for pain. It also reduces the duration of pain episodes by at least 50%. It depends on compliance. Compliance can be assessed by a rise in [facility] and drop in WBC/retic/ANC, and platelet count. It typically causes a rise in hemoglobin F, which can be assessed via hemoglobinopathy. It takes 3-6 months to see optimal effects of hydroxyurea therapy.   Pulmonary evaluation - Patient denies severe recurrent wheezes, shortness of breath with exercise, or persistent cough. If these symptoms develop, pulmonary function tests with spirometry will be ordered, and if abnormal, plan on referral to Pulmonology for further evaluation.  Cardiac - Routine screening for pulmonary hypertension is not recommended.  Eye - High risk of proliferative retinopathy. Annual eye exam with retinal exam recommended to patient. Last exam 1 year ago.   Immunization status - Patient is up to date with immunizations   Acute and chronic painful episodes - We agreed on his current medication dosage. We discussed that pt is to receive his Schedule II prescriptions only from [place]. Pt is also aware that the prescription history is available to [place] online through the [facility] CSRS. Controlled substance agreement signed previously.  We reminded [name] that all patients receiving Schedule II narcotics must be seen for follow within one month of prescription being requested. We reviewed the terms of our pain agreement, including the need to keep medicines in a safe locked location away from children or pets, and the need to report excess sedation or constipation, measures to avoid constipation, and policies related to early refills and stolen prescriptions. According to the [place] Chronic Pain Initiative program, we have reviewed details related to analgesia, adverse effects, aberrant behaviors. Reviewed [place] Substance Reporting system prior to  prescribing opiate pain medications, no inconsistencies noted.  - CBC with Differential - COMPLETE METABOLIC PANEL WITH GFR - Reticulocytes - oxyCODONE-acetaminophen (PERCOCET) 10-325 MG tablet; Take 1 tablet by mouth every 4 (four) hours as needed for pain.  Dispense: 90 tablet; Refill: 0  2. Chronic prescription opiate use - Pain Mgmt, Profile 8 w/Conf, U     Opioid Risk Tool - 09/08/16 1300      Family History of Substance Abuse   Alcohol Negative   Illegal Drugs Negative   Rx Drugs Negative     Personal History of Substance Abuse   Alcohol Negative   Illegal Drugs Negative   Rx Drugs Negative     Age   Age between 39-45 years  Yes     History of Preadolescent Sexual Abuse   History of Preadolescent Sexual Abuse Negative or Male     Psychological Disease   Psychological Disease Negative   Depression Negative     Total Score   Opioid Risk Tool Scoring 1   Opioid Risk Interpretation Low Risk     3. Screening for HIV (human immunodeficiency virus) - HIV antibody (with reflex)  RTC: 3 months for medication management.   The patient was given clear instructions to go  to ER or return to medical center if symptoms do not improve, worsen or new problems develop. The patient verbalized understanding. Will notify patient with laboratory results. Dorena Dew, FNP

## 2016-09-09 LAB — CBC WITH DIFFERENTIAL/PLATELET
BASOS PCT: 0 %
Basophils Absolute: 0 cells/uL (ref 0–200)
EOS ABS: 56 {cells}/uL (ref 15–500)
Eosinophils Relative: 1 %
HCT: 27.8 % — ABNORMAL LOW (ref 38.5–50.0)
Hemoglobin: 9.4 g/dL — ABNORMAL LOW (ref 13.2–17.1)
Lymphocytes Relative: 30 %
Lymphs Abs: 1680 cells/uL (ref 850–3900)
MCH: 32.2 pg (ref 27.0–33.0)
MCHC: 33.8 g/dL (ref 32.0–36.0)
MCV: 95.2 fL (ref 80.0–100.0)
MONO ABS: 560 {cells}/uL (ref 200–950)
MONOS PCT: 10 %
MPV: 8.7 fL (ref 7.5–12.5)
NEUTROS ABS: 3304 {cells}/uL (ref 1500–7800)
Neutrophils Relative %: 59 %
PLATELETS: 623 10*3/uL — AB (ref 140–400)
RBC: 2.92 MIL/uL — ABNORMAL LOW (ref 4.20–5.80)
RDW: 18.7 % — ABNORMAL HIGH (ref 11.0–15.0)
WBC: 5.6 10*3/uL (ref 3.8–10.8)

## 2016-09-09 LAB — RETICULOCYTES
ABS Retic: 204400 cells/uL — ABNORMAL HIGH (ref 25000–90000)
RBC.: 2.92 MIL/uL — AB (ref 4.20–5.80)
Retic Ct Pct: 7 %

## 2016-09-11 LAB — PAIN MGMT, PROFILE 8 W/CONF, U
6 ACETYLMORPHINE: NEGATIVE ng/mL (ref <10)
ALCOHOL METABOLITES: NEGATIVE ng/mL (ref <500)
Amphetamines: NEGATIVE ng/mL (ref <500)
BENZODIAZEPINES: NEGATIVE ng/mL (ref <100)
BUPRENORPHINE: NEGATIVE ng/mL (ref <5)
COCAINE METABOLITE: NEGATIVE ng/mL (ref <150)
Codeine: NEGATIVE ng/mL (ref <50)
Creatinine: 112.4 mg/dL (ref >=20.0)
HYDROCODONE: NEGATIVE ng/mL (ref <50)
Hydromorphone: NEGATIVE ng/mL (ref <50)
MDMA: NEGATIVE ng/mL (ref <500)
MORPHINE: NEGATIVE ng/mL (ref <50)
Marijuana Metabolite: NEGATIVE ng/mL (ref <20)
NORHYDROCODONE: NEGATIVE ng/mL (ref <50)
Noroxycodone: 4128 ng/mL — ABNORMAL HIGH (ref <50)
OPIATES: NEGATIVE ng/mL (ref <100)
OXIDANT: NEGATIVE ug/mL (ref <200)
OXYMORPHONE: 2836 ng/mL — AB (ref <50)
Oxycodone: 973 ng/mL — ABNORMAL HIGH (ref <50)
Oxycodone: POSITIVE ng/mL — AB (ref <100)
Please note:: 0
pH: 6.95 (ref 4.5–9.0)

## 2016-10-21 ENCOUNTER — Telehealth: Payer: Self-pay

## 2016-10-25 ENCOUNTER — Other Ambulatory Visit: Payer: Self-pay | Admitting: *Deleted

## 2016-10-25 ENCOUNTER — Other Ambulatory Visit: Payer: Self-pay | Admitting: Internal Medicine

## 2016-10-25 DIAGNOSIS — D571 Sickle-cell disease without crisis: Secondary | ICD-10-CM

## 2016-10-25 MED ORDER — OXYCODONE-ACETAMINOPHEN 10-325 MG PO TABS: 1.0000 | ORAL_TABLET | ORAL | 0 refills | Status: DC | PRN

## 2016-11-28 ENCOUNTER — Telehealth: Payer: Self-pay

## 2016-11-29 ENCOUNTER — Other Ambulatory Visit: Payer: Self-pay | Admitting: Family Medicine

## 2016-11-29 DIAGNOSIS — D571 Sickle-cell disease without crisis: Secondary | ICD-10-CM

## 2016-11-29 MED ORDER — OXYCODONE-ACETAMINOPHEN 10-325 MG PO TABS: 1.0000 | ORAL_TABLET | ORAL | 0 refills | Status: DC | PRN

## 2016-11-29 NOTE — Progress Notes (Signed)
Reviewed [place] Substance Reporting system prior to prescribing opiate medications. No inconsistencies noted.  Meds ordered this encounter  Medications  . oxyCODONE-acetaminophen (PERCOCET) 10-325 MG tablet    Sig: Take 1 tablet by mouth every 4 (four) hours as needed for pain.    Dispense:  90 tablet    Refill:  0    Order Specific Question:   Supervising Provider    Answer:   Tresa Garter [[id number]]     Donia Pounds  MSN, FNP-C [facility] [address] [place], [place] [address] [phone]

## 2016-12-08 ENCOUNTER — Ambulatory Visit: Payer: Self-pay | Admitting: Family Medicine

## 2016-12-26 ENCOUNTER — Telehealth: Payer: Self-pay

## 2016-12-27 ENCOUNTER — Other Ambulatory Visit: Payer: Self-pay | Admitting: Internal Medicine

## 2016-12-27 DIAGNOSIS — D571 Sickle-cell disease without crisis: Secondary | ICD-10-CM

## 2016-12-27 MED ORDER — OXYCODONE-ACETAMINOPHEN 10-325 MG PO TABS: 1.0000 | ORAL_TABLET | ORAL | 0 refills | Status: DC | PRN

## 2016-12-27 NOTE — Telephone Encounter (Signed)
Refilled

## 2017-01-03 ENCOUNTER — Other Ambulatory Visit: Payer: Self-pay | Admitting: Internal Medicine

## 2017-01-03 ENCOUNTER — Telehealth: Payer: Self-pay

## 2017-01-03 DIAGNOSIS — D571 Sickle-cell disease without crisis: Secondary | ICD-10-CM

## 2017-01-03 MED ORDER — OXYCODONE-ACETAMINOPHEN 10-325 MG PO TABS: 1.0000 | ORAL_TABLET | ORAL | 0 refills | Status: DC | PRN

## 2017-01-30 ENCOUNTER — Telehealth: Payer: Self-pay

## 2017-01-31 ENCOUNTER — Other Ambulatory Visit: Payer: Self-pay | Admitting: Internal Medicine

## 2017-01-31 DIAGNOSIS — D571 Sickle-cell disease without crisis: Secondary | ICD-10-CM

## 2017-01-31 MED ORDER — OXYCODONE-ACETAMINOPHEN 10-325 MG PO TABS: 1.0000 | ORAL_TABLET | ORAL | 0 refills | Status: DC | PRN

## 2017-01-31 MED ORDER — HYDROXYUREA 500 MG PO CAPS: ORAL_CAPSULE | ORAL | 3 refills | Status: DC

## 2017-01-31 NOTE — Telephone Encounter (Signed)
Refilled

## 2017-03-06 ENCOUNTER — Telehealth: Payer: Self-pay

## 2017-03-07 ENCOUNTER — Other Ambulatory Visit: Payer: Self-pay | Admitting: Family Medicine

## 2017-03-07 DIAGNOSIS — D571 Sickle-cell disease without crisis: Secondary | ICD-10-CM

## 2017-03-07 MED ORDER — OXYCODONE-ACETAMINOPHEN 10-325 MG PO TABS: 1.0000 | ORAL_TABLET | ORAL | 0 refills | Status: DC | PRN

## 2017-03-07 NOTE — Progress Notes (Signed)
[name], a 36 year old male called requesting opiate prescription for chronic pain. Reviewed [place] Substance Reporting system prior to prescribing opiate medications. No inconsistencies noted.   Meds ordered this encounter  Medications  . oxyCODONE-acetaminophen (PERCOCET) 10-325 MG tablet    Sig: Take 1 tablet by mouth every 4 (four) hours as needed for pain.    Dispense:  90 tablet    Refill:  0    Order Specific Question:   Supervising Provider    Answer:   Tresa Garter [[id number]]     Donia Pounds  MSN, FNP-C [facility] [address] [place], [place] [address] [phone]

## 2017-03-08 ENCOUNTER — Ambulatory Visit (INDEPENDENT_AMBULATORY_CARE_PROVIDER_SITE_OTHER): Payer: Medicare Other | Admitting: Family Medicine

## 2017-03-08 ENCOUNTER — Encounter: Payer: Self-pay | Admitting: Family Medicine

## 2017-03-08 VITALS — BP 112/62 | HR 63 | Temp 98.2°F | Resp 14 | Ht 73.0 in | Wt 150.0 lb

## 2017-03-08 DIAGNOSIS — D571 Sickle-cell disease without crisis: Secondary | ICD-10-CM | POA: Diagnosis not present

## 2017-03-08 DIAGNOSIS — Z114 Encounter for screening for human immunodeficiency virus [HIV]: Secondary | ICD-10-CM

## 2017-03-08 DIAGNOSIS — Z202 Contact with and (suspected) exposure to infections with a predominantly sexual mode of transmission: Secondary | ICD-10-CM

## 2017-03-08 LAB — COMPLETE METABOLIC PANEL WITH GFR
ALT: 16 U/L (ref 9–46)
AST: 27 U/L (ref 10–40)
Albumin: 4.7 g/dL (ref 3.6–5.1)
Alkaline Phosphatase: 64 U/L (ref 40–115)
BUN: 8 mg/dL (ref 7–25)
CALCIUM: 9.8 mg/dL (ref 8.6–10.3)
CHLORIDE: 100 mmol/L (ref 98–110)
CO2: 23 mmol/L (ref 20–31)
CREATININE: 0.76 mg/dL (ref 0.60–1.35)
GFR, Est Non African American: >89 mL/min (ref >=60)
Glucose, Bld: 85 mg/dL (ref 65–99)
POTASSIUM: 4.8 mmol/L (ref 3.5–5.3)
Sodium: 140 mmol/L (ref 135–146)
Total Bilirubin: 2.7 mg/dL — ABNORMAL HIGH (ref 0.2–1.2)
Total Protein: 8.4 g/dL — ABNORMAL HIGH (ref 6.1–8.1)

## 2017-03-08 LAB — POCT URINALYSIS DIP (DEVICE)
Bilirubin Urine: NEGATIVE
GLUCOSE, UA: NEGATIVE mg/dL
Hgb urine dipstick: NEGATIVE
KETONES UR: NEGATIVE mg/dL
Leukocytes, UA: NEGATIVE
NITRITE: NEGATIVE
PH: 5.5 (ref 5.0–8.0)
Protein, ur: NEGATIVE mg/dL
Specific Gravity, Urine: 1.01 (ref 1.005–1.030)
UROBILINOGEN UA: 1 mg/dL (ref 0.0–1.0)

## 2017-03-08 LAB — CBC WITH DIFFERENTIAL/PLATELET
BASOS PCT: 0 %
Basophils Absolute: 0 cells/uL (ref 0–200)
EOS ABS: 309 {cells}/uL (ref 15–500)
Eosinophils Relative: 3 %
HEMATOCRIT: 30.1 % — AB (ref 38.5–50.0)
HEMOGLOBIN: 10.1 g/dL — AB (ref 13.2–17.1)
LYMPHS ABS: 2060 {cells}/uL (ref 850–3900)
LYMPHS PCT: 20 %
MCH: 31.9 pg (ref 27.0–33.0)
MCHC: 33.6 g/dL (ref 32.0–36.0)
MCV: 95 fL (ref 80.0–100.0)
MONO ABS: 1030 {cells}/uL — AB (ref 200–950)
MPV: 9 fL (ref 7.5–12.5)
Monocytes Relative: 10 %
NEUTROS ABS: 6901 {cells}/uL (ref 1500–7800)
Neutrophils Relative %: 67 %
Platelets: 381 10*3/uL (ref 140–400)
RBC: 3.17 MIL/uL — AB (ref 4.20–5.80)
RDW: 19.9 % — AB (ref 11.0–15.0)
WBC: 10.3 10*3/uL (ref 3.8–10.8)

## 2017-03-08 NOTE — Progress Notes (Signed)
Subjective:    Patient ID: [name], male    DOB: [birth year], 36 y.o.   MRN: [id number]  HPI [name]. [name], a 36 year old male with a history of sickle cell anemia, HbSS presents for a 1 month follow up of sickle cell anemia. [name] states that he feels well and is without current complaint. He is taking medications consistently.  He reports that he had pain in his lower back this am that was consistent with typical sickle cell pain. He last had Percocet 10-325 mg this am  with maximum relief. His current pain intensity is 2/10. He maintains that sickle cell crises typically resolve with rest, hydration, and opiate medications. He denies fatigue, shortness of breath, chest pains, nausea, vomiting, diarrhea, or constipation. Past Medical History:  Diagnosis Date  . Sickle cell anemia (HCC)    Immunization History  Administered Date(s) Administered  . Influenza,inj,Quad PF,36+ Mos 07/03/2014  . Pneumococcal Conjugate-13 01/01/2015  . Tdap 08/26/2013  No Known Allergies Social History   Social History  . Marital status: Single    Spouse name: N/A  . Number of children: N/A  . Years of education: N/A   Occupational History  . Not on file.   Social History Main Topics  . Smoking status: Never Smoker  . Smokeless tobacco: Never Used  . Alcohol use No  . Drug use: No  . Sexual activity: Yes   Other Topics Concern  . Not on file   Social History Narrative  . No narrative on file   Immunization History  Administered Date(s) Administered  . Influenza,inj,Quad PF,36+ Mos 07/03/2014  . Pneumococcal Conjugate-13 01/01/2015  . Tdap 08/26/2013   Review of Systems  Constitutional: Negative.  Negative for fatigue.  HENT: Negative.   Eyes: Negative.   Respiratory: Negative.   Cardiovascular: Negative.  Negative for chest pain, palpitations and leg swelling.  Gastrointestinal: Negative.   Endocrine: Negative for polydipsia, polyphagia and polyuria.  Musculoskeletal:  Positive for myalgias.  Skin: Negative.   Allergic/Immunologic: Negative.   Neurological: Negative.  Negative for dizziness.  Hematological: Negative.   Psychiatric/Behavioral: Negative.        Objective:   Physical Exam  Constitutional: He is oriented to person, place, and time. He appears well-developed.  HENT:  Head: Normocephalic and atraumatic.  Right Ear: External ear normal.  Left Ear: External ear normal.  Mouth/Throat: Oropharynx is clear and moist.  Eyes: Pupils are equal, round, and reactive to light. Conjunctivae and EOM are normal.  Neck: Normal range of motion. Neck supple.  Cardiovascular: Normal rate, regular rhythm, normal heart sounds and intact distal pulses.   Pulmonary/Chest: Effort normal and breath sounds normal.  Abdominal: Soft. Bowel sounds are normal.  Musculoskeletal: Normal range of motion.  Neurological: He is alert and oriented to person, place, and time. He has normal reflexes.  Skin: Skin is warm and dry.  Psychiatric: He has a normal mood and affect. His behavior is normal. Judgment and thought content normal.    BP 112/62 (BP Location: Left Arm, Patient Position: Sitting, Cuff Size: Normal)   Pulse 63   Temp 98.2 F (36.8 C) (Oral)   Resp 14   Ht 6\' 1"  (1.854 m)   Wt 150 lb (68 kg)   SpO2 97%   BMI 19.79 kg/m     Assessment & Plan:  1. Hb-SS disease without crisis ([name]) Sickle cell disease - Continue Hydrea at current dosage. Will check ANC, platelet count, and hemoglobin today. We  discussed the need for good hydration, monitoring of hydration status, avoidance of heat, cold, stress, and infection triggers. We discussed the risks and benefits of Hydrea, including bone marrow suppression, the possibility of GI upset, skin ulcers, hair thinning, and teratogenicity. The patient was reminded of the need to seek medical attention of any symptoms of bleeding, anemia, or infection. Continue folic acid 1 mg daily to prevent aplastic bone marrow  crises.   We discussed in detail that hydroxyurea has been found to be effective in reducing VOC, transfusions, and admissions for pain. It also reduces the duration of pain episodes by at least 50%. It depends on compliance. Compliance can be assessed by a rise in [name] and drop in WBC/retic/ANC, and platelet count. It typically causes a rise in hemoglobin F, which can be assessed via hemoglobinopathy. It takes 3-6 months to see optimal effects of hydroxyurea therapy.   Pulmonary evaluation - Patient denies severe recurrent wheezes, shortness of breath with exercise, or persistent cough. If these symptoms develop, pulmonary function tests with spirometry will be ordered, and if abnormal, plan on referral to Pulmonology for further evaluation.  Cardiac - Routine screening for pulmonary hypertension is not recommended.  Eye - High risk of proliferative retinopathy. Annual eye exam with retinal exam recommended to patient. Last exam 1 year ago.   Immunization status - Patient is up to date with immunizations   Acute and chronic painful episodes - We agreed on his current medication dosage. We discussed that pt is to receive his Schedule II prescriptions only from [place]. Pt is also aware that the prescription history is available to [place] online through the [name] CSRS. Controlled substance agreement signed previously.  We reminded [name] that all patients receiving Schedule II narcotics must be seen for follow within one month of prescription being requested. We reviewed the terms of our pain agreement, including the need to keep medicines in a safe locked location away from children or pets, and the need to report excess sedation or constipation, measures to avoid constipation, and policies related to early refills and stolen prescriptions. According to the [place] Chronic Pain Initiative program, we have reviewed details related to analgesia, adverse effects, aberrant behaviors. Reviewed [place] Substance Reporting system  prior to prescribing opiate pain medications, no inconsistencies noted.   - COMPLETE METABOLIC PANEL WITH GFR - CBC with Differential - Reticulocytes - Pain Mgmt, Profile 8 w/Conf, U  2. Possible exposure to STD Recommend barrier protection with sexual intercourse. Will screen for STDs. He is currently asymptomatic.  - RPR - GC/Chlamydia Probe Amp  3. Screening for HIV (human immunodeficiency virus) Patient recently changed partners; he has not been using barrier protection consistently.  - HIV antibody (with reflex)   RTC: 2 months for medication management  Donia Pounds  MSN, FNP-C [facility] [place], [place] [address] [phone]

## 2017-03-08 NOTE — Patient Instructions (Signed)
No medication changes warranted.  Recommend barrier protection with sexual intercourse.   Sickle Cell Anemia, Adult Sickle cell anemia is a condition where your red blood cells are shaped like sickles. Red blood cells carry oxygen through the body. Sickle-shaped red blood cells do not live as long as normal red blood cells. They also clump together and block blood from flowing through the blood vessels. These things prevent the body from getting enough oxygen. Sickle cell anemia causes organ damage and pain. It also increases the risk of infection. Follow these instructions at home:  Drink enough fluid to keep your pee (urine) clear or pale yellow. Drink more in hot weather and during exercise.  Do not smoke. Smoking lowers oxygen levels in the blood.  Only take over-the-counter or prescription medicines as told by your doctor.  Take antibiotic medicines as told by your doctor. Make sure you finish them even if you start to feel better.  Take supplements as told by your doctor.  Consider wearing a medical alert bracelet. This tells anyone caring for you in an emergency of your condition.  When traveling, keep your medical information, doctors' names, and the medicines you take with you at all times.  If you have a fever, do not take fever medicines right away. This could cover up a problem. Tell your doctor.  Keep all follow-up visits with your doctor. Sickle cell anemia requires regular medical care. Contact a doctor if: You have a fever. Get help right away if:  You feel dizzy or faint.  You have new belly (abdominal) pain, especially on the left side near the stomach area.  You have a lasting, often uncomfortable and painful erection of the penis (priapism). If it is not treated right away, you will become unable to have sex (impotence).  You have numbness in your arms or legs or you have a hard time moving them.  You have a hard time talking.  You have a fever or lasting  symptoms for more than 2-3 days.  You have a fever and your symptoms suddenly get worse.  You have signs or symptoms of infection. These include: ? Chills. ? Being more tired than normal (lethargy). ? Irritability. ? Poor eating. ? Throwing up (vomiting).  You have pain that is not helped with medicine.  You have shortness of breath.  You have pain in your chest.  You are coughing up pus-like or bloody mucus.  You have a stiff neck.  Your feet or hands swell or have pain.  Your belly looks bloated.  Your joints hurt. This information is not intended to replace advice given to you by your health care provider. Make sure you discuss any questions you have with your health care provider. Document Released: 05/22/2013 Document Revised: 01/07/2016 Document Reviewed: 03/13/2013 Elsevier Interactive Patient Education  2017 [employer].

## 2017-03-09 LAB — RETICULOCYTES
ABS RETIC: 262280 {cells}/uL — AB (ref 25000–90000)
RBC.: 3.16 MIL/uL — AB (ref 4.20–5.80)
Retic Ct Pct: 8.3 %

## 2017-03-09 LAB — RPR

## 2017-03-09 LAB — GC/CHLAMYDIA PROBE AMP
CT PROBE, AMP APTIMA: NOT DETECTED
GC PROBE AMP APTIMA: NOT DETECTED

## 2017-03-09 LAB — HIV ANTIBODY (ROUTINE TESTING W REFLEX): HIV 1&2 Ab, 4th Generation: NONREACTIVE

## 2017-03-17 LAB — PAIN MGMT, PROFILE 8 W/CONF, U
6 Acetylmorphine: NEGATIVE ng/mL (ref <10)
Alcohol Metabolites: NEGATIVE ng/mL (ref <500)
Amphetamines: NEGATIVE ng/mL (ref <500)
Benzodiazepines: NEGATIVE ng/mL (ref <100)
Buprenorphine: NEGATIVE ng/mL (ref <5)
CODEINE: NEGATIVE ng/mL (ref <50)
Cocaine Metabolite: NEGATIVE ng/mL (ref <150)
Creatinine: 123.9 mg/dL (ref >=20.0)
HYDROCODONE: NEGATIVE ng/mL (ref <50)
Hydromorphone: NEGATIVE ng/mL (ref <50)
MDMA: NEGATIVE ng/mL (ref <500)
Marijuana Metabolite: NEGATIVE ng/mL (ref <20)
Morphine: NEGATIVE ng/mL (ref <50)
NOROXYCODONE: 3791 ng/mL — AB (ref <50)
Norhydrocodone: NEGATIVE ng/mL (ref <50)
OXIDANT: NEGATIVE ug/mL (ref <200)
OXYMORPHONE: 1899 ng/mL — AB (ref <50)
Opiates: NEGATIVE ng/mL (ref <100)
Oxycodone: 1706 ng/mL — ABNORMAL HIGH (ref <50)
Oxycodone: POSITIVE ng/mL — AB (ref <100)
PH: 6.02 (ref 4.5–9.0)
PLEASE NOTE: 0

## 2017-04-18 ENCOUNTER — Telehealth: Payer: Self-pay

## 2017-04-18 DIAGNOSIS — Z79899 Other long term (current) drug therapy: Secondary | ICD-10-CM | POA: Diagnosis not present

## 2017-04-18 DIAGNOSIS — D5700 Hb-SS disease with crisis, unspecified: Secondary | ICD-10-CM | POA: Insufficient documentation

## 2017-04-18 DIAGNOSIS — R079 Chest pain, unspecified: Secondary | ICD-10-CM | POA: Insufficient documentation

## 2017-04-18 DIAGNOSIS — M545 Low back pain: Secondary | ICD-10-CM | POA: Diagnosis present

## 2017-04-19 ENCOUNTER — Emergency Department (HOSPITAL_COMMUNITY): Payer: Medicare Other

## 2017-04-19 ENCOUNTER — Emergency Department (HOSPITAL_COMMUNITY)
Admission: EM | Admit: 2017-04-19 | Discharge: 2017-04-19 | Disposition: A | Discharge status: Home / Self Care | Payer: Medicare Other | Attending: Emergency Medicine | Admitting: Emergency Medicine

## 2017-04-19 ENCOUNTER — Encounter (HOSPITAL_COMMUNITY): Payer: Self-pay | Admitting: Emergency Medicine

## 2017-04-19 DIAGNOSIS — D5700 Hb-SS disease with crisis, unspecified: Secondary | ICD-10-CM

## 2017-04-19 DIAGNOSIS — R079 Chest pain, unspecified: Secondary | ICD-10-CM | POA: Diagnosis not present

## 2017-04-19 LAB — RETICULOCYTES
RBC.: 3.04 MIL/uL — AB (ref 4.22–5.81)
RETIC COUNT ABSOLUTE: 291.8 10*3/uL — AB (ref 19.0–186.0)
RETIC CT PCT: 9.6 % — AB (ref 0.4–3.1)

## 2017-04-19 LAB — COMPREHENSIVE METABOLIC PANEL
ALT: 22 U/L (ref 17–63)
ANION GAP: 5 (ref 5–15)
AST: 31 U/L (ref 15–41)
Albumin: 4.6 g/dL (ref 3.5–5.0)
Alkaline Phosphatase: 50 U/L (ref 38–126)
BILIRUBIN TOTAL: 2.5 mg/dL — AB (ref 0.3–1.2)
BUN: 9 mg/dL (ref 6–20)
CALCIUM: 9.3 mg/dL (ref 8.9–10.3)
CO2: 29 mmol/L (ref 22–32)
Chloride: 103 mmol/L (ref 101–111)
Creatinine, Ser: 0.75 mg/dL (ref 0.61–1.24)
Glucose, Bld: 110 mg/dL — ABNORMAL HIGH (ref 65–99)
POTASSIUM: 3.9 mmol/L (ref 3.5–5.1)
Sodium: 137 mmol/L (ref 135–145)
TOTAL PROTEIN: 8.6 g/dL — AB (ref 6.5–8.1)

## 2017-04-19 LAB — CBC WITH DIFFERENTIAL/PLATELET
BASOS ABS: 0.1 10*3/uL (ref 0.0–0.1)
BASOS PCT: 0 %
Eosinophils Absolute: 0.1 10*3/uL (ref 0.0–0.7)
Eosinophils Relative: 1 %
HEMATOCRIT: 26.5 % — AB (ref 39.0–52.0)
HEMOGLOBIN: 9.6 g/dL — AB (ref 13.0–17.0)
LYMPHS PCT: 13 %
Lymphs Abs: 1.9 10*3/uL (ref 0.7–4.0)
MCH: 31.6 pg (ref 26.0–34.0)
MCHC: 36.2 g/dL — ABNORMAL HIGH (ref 30.0–36.0)
MCV: 87.2 fL (ref 78.0–100.0)
Monocytes Absolute: 1.2 10*3/uL — ABNORMAL HIGH (ref 0.1–1.0)
Monocytes Relative: 8 %
NEUTROS ABS: 11.7 10*3/uL — AB (ref 1.7–7.7)
NEUTROS PCT: 78 %
Platelets: 465 10*3/uL — ABNORMAL HIGH (ref 150–400)
RBC: 3.04 MIL/uL — AB (ref 4.22–5.81)
RDW: 17.7 % — AB (ref 11.5–15.5)
WBC: 15 10*3/uL — AB (ref 4.0–10.5)

## 2017-04-19 LAB — I-STAT TROPONIN, ED: Troponin i, poc: 0 ng/mL (ref 0.00–0.08)

## 2017-04-19 MED ORDER — KETOROLAC TROMETHAMINE 30 MG/ML IJ SOLN
30.0000 mg | Freq: Once | INTRAMUSCULAR | Status: AC
  Administered 2017-04-19: 30 mg via INTRAVENOUS
  Filled 2017-04-19: qty 1

## 2017-04-19 MED ORDER — HYDROMORPHONE HCL 1 MG/ML IJ SOLN: 1.0000 mg | INTRAMUSCULAR | Status: AC

## 2017-04-19 MED ORDER — DIPHENHYDRAMINE HCL 25 MG PO CAPS
25.0000 mg | ORAL_CAPSULE | ORAL | Status: DC | PRN
  Filled 2017-04-19: qty 1

## 2017-04-19 MED ORDER — HYDROMORPHONE HCL 1 MG/ML IJ SOLN
1.0000 mg | INTRAMUSCULAR | Status: AC
  Administered 2017-04-19: 1 mg via INTRAVENOUS
  Filled 2017-04-19: qty 1

## 2017-04-19 MED ORDER — ONDANSETRON HCL 4 MG/2ML IJ SOLN
4.0000 mg | INTRAMUSCULAR | Status: DC | PRN
  Administered 2017-04-19: 4 mg via INTRAVENOUS
  Filled 2017-04-19: qty 2

## 2017-04-19 MED ORDER — SODIUM CHLORIDE 0.45 % IV SOLN
INTRAVENOUS | Status: DC
  Administered 2017-04-19: 01:00:00 via INTRAVENOUS

## 2017-04-19 NOTE — ED Provider Notes (Signed)
[facility] DEPT Provider Note   CSN: [id number] Arrival date & time: 04/18/17  2242     History   Chief Complaint Chief Complaint  Patient presents with  . Sickle Cell Pain Crisis    HPI [name] is a 36 y.o. male.  The history is provided by the patient and medical records.  Sickle Cell Pain Crisis  Associated symptoms: chest pain     36 y.o. M with hx of SCA (type SS), presenting to the ED with sickle cell pain crisis. Reports this began yesterday but has been progressively worsening. Reports pain in his low back, left leg, and chest. He denies any cough, fever, or shortness of breath. No numbness, weakness, or changes in gait. No bowel or bladder incontinence.  States he takes home oxycodone as well as hydroxyurea but has not had any relief from this. States he has been doing well over the past few months with very few crises requiring hospital visit. He is managed by the sickle cell center.  Past Medical History:  Diagnosis Date  . Sickle cell anemia [facility])     Patient Active Problem List   Diagnosis Date Noted  . Sickle cell pain crisis ([place]) 09/08/2015  . Routine general medical examination at a health care facility 02/11/2015  . Acute chest syndrome ([place]) 10/14/2014  . Hypokalemia 10/14/2014  . Anemia of chronic disease 10/14/2014  . CAP (community acquired pneumonia) 10/12/2014  . Sickle cell crisis ([place]) 10/10/2014  . Screening for STDs (sexually transmitted diseases) 01/09/2014  . Pain of right clavicle 11/14/2013  . Underweight 08/26/2013  . Need for Tdap vaccination 08/26/2013  . Hb-SS disease without crisis ([place]) 08/26/2013  . Sickle cell anemia with crisis ([place]) 08/21/2013  . Chronic pain syndrome 02/18/2013  . Sickle cell anemia ([place]) 01/18/2013  . Pain 01/18/2013    Past Surgical History:  Procedure Laterality Date  . CHOLECYSTECTOMY         Home Medications    Prior to Admission medications   Medication Sig Start Date End Date  Taking? Authorizing Provider  folic acid (FOLVITE) 762 MCG tablet Take 400 mcg by mouth daily.    [provider]  hydroxyurea (HYDREA) 500 MG capsule TAKE 2 CAPSULES BY MOUTH TWICE A DAY MAY TAKE WITH FOOD TO MINIMIZE GI SIDE EFFECTS 01/31/17   Tresa Garter, MD  oxyCODONE-acetaminophen (PERCOCET) 10-325 MG tablet Take 1 tablet by mouth every 4 (four) hours as needed for pain. 03/07/17   Dorena Dew, FNP    Family History Family History  Problem Relation Age of Onset  . Sickle cell anemia Mother   . Sickle cell trait Father   . Sickle cell trait Sister   . Sickle cell trait Brother   . Cancer Maternal Grandmother     Social History Social History  Substance Use Topics  . Smoking status: Never Smoker  . Smokeless tobacco: Never Used  . Alcohol use No     Allergies   Patient has no known allergies.   Review of Systems Review of Systems  Cardiovascular: Positive for chest pain.  Musculoskeletal: Positive for arthralgias and back pain.  All other systems reviewed and are negative.    Physical Exam Updated Vital Signs BP 124/71 (BP Location: Left Arm)   Pulse 82   Temp 99.3 F (37.4 C) (Oral)   Resp 20   Ht 6\' 1"  (1.854 m)   Wt 68 kg (150 lb)   SpO2 100%   BMI 19.79 kg/m  Physical Exam  Constitutional: He is oriented to person, place, and time. He appears well-developed and well-nourished.  Uncomfortable appearing, writhing around in bed  HENT:  Head: Normocephalic and atraumatic.  Mouth/Throat: Oropharynx is clear and moist.  Eyes: Pupils are equal, round, and reactive to light. Conjunctivae and EOM are normal.  Neck: Normal range of motion.  Cardiovascular: Normal rate, regular rhythm and normal heart sounds.   Pulmonary/Chest: Effort normal and breath sounds normal. No respiratory distress. He has no wheezes.  Abdominal: Soft. Bowel sounds are normal. There is no tenderness. There is no rebound.  Musculoskeletal: Normal range of motion.   No deformities noted at the spine, normal strength and sensation of both legs  Neurological: He is alert and oriented to person, place, and time.  Skin: Skin is warm and dry.  Psychiatric: He has a normal mood and affect.  Nursing note and vitals reviewed.    ED Treatments / Results  Labs (all labs ordered are listed, but only abnormal results are displayed) Labs Reviewed  COMPREHENSIVE METABOLIC PANEL - Abnormal; Notable for the following:       Result Value   Glucose, Bld 110 (*)    Total Protein 8.6 (*)    Total Bilirubin 2.5 (*)    All other components within normal limits  CBC WITH DIFFERENTIAL/PLATELET - Abnormal; Notable for the following:    WBC 15.0 (*)    RBC 3.04 (*)    Hemoglobin 9.6 (*)    HCT 26.5 (*)    MCHC 36.2 (*)    RDW 17.7 (*)    Platelets 465 (*)    Neutro Abs 11.7 (*)    Monocytes Absolute 1.2 (*)    All other components within normal limits  RETICULOCYTES - Abnormal; Notable for the following:    Retic Ct Pct 9.6 (*)    RBC. 3.04 (*)    Retic Count, Absolute 291.8 (*)    All other components within normal limits  I-STAT TROPONIN, ED    EKG  EKG Interpretation  Date/Time:  Wednesday April 19 2017 00:07:40 EDT Ventricular Rate:  81 PR Interval:    QRS Duration: 98 QT Interval:  385 QTC Calculation: 447 R Axis:   68 Text Interpretation:  Sinus rhythm Interpretation limited secondary to artifact Confirmed by Ripley Fraise ([id number]) on 04/19/2017 12:34:51 AM       Radiology Dg Chest 2 View  Result Date: 04/19/2017 CLINICAL DATA:  Chest pain.  Sickle cell crisis. EXAM: CHEST  2 VIEW COMPARISON:  Radiograph 09/12/2015 FINDINGS: Slight decreased cardiomegaly from prior exam. Mediastinal contours are normal. The lungs are clear. Pulmonary vasculature is normal. No consolidation, pleural effusion, or pneumothorax. No acute osseous abnormalities are seen. IMPRESSION: Cardiomegaly has decreased from prior exam. No acute pulmonary process.  Electronically Signed   By: Jeb Levering M.D.   On: 04/19/2017 01:27    Procedures Procedures (including critical care time)  Medications Ordered in ED Medications  ondansetron (ZOFRAN) injection 4 mg (4 mg Intravenous Given 04/19/17 0115)  diphenhydrAMINE (BENADRYL) capsule 25 mg (not administered)  0.45 % sodium chloride infusion ( Intravenous Stopped 04/19/17 0340)  HYDROmorphone (DILAUDID) injection 1 mg (1 mg Intravenous Given 04/19/17 0200)    Or  HYDROmorphone (DILAUDID) injection 1 mg ( Subcutaneous See Alternative 04/19/17 0200)  HYDROmorphone (DILAUDID) injection 1 mg (1 mg Intravenous Given 04/19/17 0308)    Or  HYDROmorphone (DILAUDID) injection 1 mg ( Subcutaneous See Alternative 04/19/17 0308)  HYDROmorphone (DILAUDID) injection 1 mg (1  mg Intravenous Given 04/19/17 0114)    Or  HYDROmorphone (DILAUDID) injection 1 mg ( Subcutaneous See Alternative 04/19/17 0114)  ketorolac (TORADOL) 30 MG/ML injection 30 mg (30 mg Intravenous Given 04/19/17 0114)     Initial Impression / Assessment and Plan / ED Course  I have reviewed the triage vital signs and the nursing notes.  Pertinent labs & imaging results that were available during my care of the patient were reviewed by me and considered in my medical decision making (see chart for details).  36 year old male here with sickle cell pain crisis. Reports pain in the back, left leg, and chest. No shortness of breath, fever, or cough. No focal neurologic deficits concerning for cauda equina. Patient screening labs, EKG, chest x-ray overall reassuring. No signs or symptoms concerning for acute chest syndrome.Patient does appear uncomfortable on exam. Will initiate sickle cell protocol for pain control. Will reassess.  3:01 AM Patient feeling better after 2 rounds of dilaudid and IV toradol.  Feels pain is tolerable at this time and he can rest comfortably at home. He did request to have his third dose here to help get him through the night, feel  this is reasonable. Vitals remain stable.  Patient very comfortable after 3rd dose of meds.  Feels he can rest easily at home.  Patient appears stable for discharge.  Can follow-up with PCP, continue home meds.  Return precautions given for any new/worsening symptoms.  Final Clinical Impressions(s) / ED Diagnoses   Final diagnoses:  Sickle cell pain crisis [facility])    New Prescriptions Discharge Medication List as of 04/19/2017  3:29 AM       Larene Pickett, PA-C 04/19/17 [id number]    Ripley Fraise, MD 04/20/17 [phone]

## 2017-04-19 NOTE — Discharge Instructions (Signed)
Labs and chest x-ray today looked good, we are glad you are feeling better. Can continue your home pain medications. Follow-up with your primary care doctor. Return here for any new concerns.

## 2017-04-19 NOTE — ED Notes (Signed)
Patient transported to X-ray 

## 2017-04-20 ENCOUNTER — Other Ambulatory Visit: Payer: Self-pay | Admitting: Family Medicine

## 2017-04-20 DIAGNOSIS — D571 Sickle-cell disease without crisis: Secondary | ICD-10-CM

## 2017-04-20 MED ORDER — OXYCODONE-ACETAMINOPHEN 10-325 MG PO TABS: 1.0000 | ORAL_TABLET | ORAL | 0 refills | Status: DC | PRN

## 2017-04-20 NOTE — Progress Notes (Signed)
Reviewed [place] Substance Reporting system prior to prescribing opiate medications. No inconsistencies noted.   Meds ordered this encounter  Medications  . oxyCODONE-acetaminophen (PERCOCET) 10-325 MG tablet    Sig: Take 1 tablet by mouth every 4 (four) hours as needed for pain.    Dispense:  90 tablet    Refill:  0    Order Specific Question:   Supervising Provider    Answer:   Tresa Garter [[id number]]     [name] Pounds  MSN, FNP-C Patient [name] [address] [place], [place] [address] [phone]

## 2017-04-27 ENCOUNTER — Ambulatory Visit: Payer: Self-pay | Admitting: Family Medicine

## 2017-05-16 ENCOUNTER — Telehealth: Payer: Self-pay

## 2017-05-18 ENCOUNTER — Other Ambulatory Visit: Payer: Self-pay | Admitting: Family Medicine

## 2017-05-18 DIAGNOSIS — D571 Sickle-cell disease without crisis: Secondary | ICD-10-CM

## 2017-05-18 MED ORDER — OXYCODONE-ACETAMINOPHEN 10-325 MG PO TABS: 1.0000 | ORAL_TABLET | ORAL | 0 refills | Status: DC | PRN

## 2017-05-18 NOTE — Progress Notes (Signed)
Reviewed [place] Substance Reporting system prior to prescribing opiate medications. No inconsistencies noted.    Meds ordered this encounter  Medications  . oxyCODONE-acetaminophen (PERCOCET) 10-325 MG tablet    Sig: Take 1 tablet by mouth every 4 (four) hours as needed for pain.    Dispense:  90 tablet    Refill:  0    Order Specific Question:   Supervising Provider    Answer:   Tresa Garter [[id number]]     Donia Pounds  MSN, FNP-C Patient [facility] [address] [place], [place] [address] [phone]

## 2017-06-20 ENCOUNTER — Telehealth: Payer: Self-pay

## 2017-06-21 ENCOUNTER — Other Ambulatory Visit: Payer: Self-pay | Admitting: Family Medicine

## 2017-06-21 DIAGNOSIS — Z79891 Long term (current) use of opiate analgesic: Secondary | ICD-10-CM

## 2017-06-21 DIAGNOSIS — D571 Sickle-cell disease without crisis: Secondary | ICD-10-CM

## 2017-06-21 MED ORDER — OXYCODONE-ACETAMINOPHEN 10-325 MG PO TABS: 1.0000 | ORAL_TABLET | ORAL | 0 refills | Status: DC | PRN

## 2017-06-21 NOTE — Progress Notes (Signed)
Reviewed [place] Substance Reporting system prior to prescribing opiate medications. No inconsistencies noted.     Meds ordered this encounter  Medications  . oxyCODONE-acetaminophen (PERCOCET) 10-325 MG tablet    Sig: Take 1 tablet every 4 (four) hours as needed by mouth for pain.    Dispense:  90 tablet    Refill:  0    Order Specific Question:   Supervising Provider    Answer:   Tresa Garter [[id number]]     Donia Pounds  MSN, FNP-C Patient [facility] [address] [place], [place] [address] [phone]

## 2017-07-16 ENCOUNTER — Telehealth: Payer: Self-pay

## 2017-07-17 ENCOUNTER — Other Ambulatory Visit: Payer: Self-pay | Admitting: Family Medicine

## 2017-07-17 DIAGNOSIS — D571 Sickle-cell disease without crisis: Secondary | ICD-10-CM

## 2017-07-17 MED ORDER — OXYCODONE-ACETAMINOPHEN 10-325 MG PO TABS: 1.0000 | ORAL_TABLET | ORAL | 0 refills | Status: DC | PRN

## 2017-07-17 NOTE — Progress Notes (Signed)
Reviewed [place] Substance Reporting system prior to prescribing opiate medications. No inconsistencies noted.  Meds ordered this encounter  Medications  . oxyCODONE-acetaminophen (PERCOCET) 10-325 MG tablet    Sig: Take 1 tablet by mouth every 4 (four) hours as needed for pain.    Dispense:  90 tablet    Refill:  0    Order Specific Question:   Supervising Provider    Answer:   Tresa Garter [[id number]]    [name] Pounds  MSN, FNP-C Patient [facility] [address] [place], [place] [address] [phone]

## 2017-08-21 ENCOUNTER — Telehealth: Payer: Self-pay

## 2017-08-21 ENCOUNTER — Other Ambulatory Visit: Payer: Self-pay | Admitting: Family Medicine

## 2017-08-21 NOTE — Progress Notes (Signed)
Patient will need to schedule a follow up appointment to continue to receive opiate medications. Last office visit was 03/08/2017.    [name] Pounds  MSN, FNP-C Patient [facility] [address] [place], [place] [address] [phone]

## 2017-08-23 ENCOUNTER — Encounter: Payer: Self-pay | Admitting: Family Medicine

## 2017-08-23 ENCOUNTER — Ambulatory Visit (INDEPENDENT_AMBULATORY_CARE_PROVIDER_SITE_OTHER): Payer: Medicare Other | Admitting: Family Medicine

## 2017-08-23 VITALS — BP 132/72 | HR 60 | Temp 98.3°F | Resp 14 | Ht 73.0 in | Wt 155.0 lb

## 2017-08-23 DIAGNOSIS — D571 Sickle-cell disease without crisis: Secondary | ICD-10-CM

## 2017-08-23 DIAGNOSIS — G894 Chronic pain syndrome: Secondary | ICD-10-CM | POA: Diagnosis not present

## 2017-08-23 LAB — POCT URINALYSIS DIP (DEVICE)
Bilirubin Urine: NEGATIVE
GLUCOSE, UA: NEGATIVE mg/dL
Hgb urine dipstick: NEGATIVE
KETONES UR: NEGATIVE mg/dL
Leukocytes, UA: NEGATIVE
NITRITE: NEGATIVE
PH: 6 (ref 5.0–8.0)
PROTEIN: NEGATIVE mg/dL
Specific Gravity, Urine: 1.015 (ref 1.005–1.030)
UROBILINOGEN UA: 4 mg/dL — AB (ref 0.0–1.0)

## 2017-08-23 MED ORDER — OXYCODONE-ACETAMINOPHEN 10-325 MG PO TABS: 1.0000 | ORAL_TABLET | ORAL | 0 refills | Status: DC | PRN

## 2017-08-23 NOTE — Progress Notes (Signed)
Patient ID: [name], male    DOB: 1981/03/19, 37 y.o.   MRN: [id number]  PCP: Tresa Garter, MD  Chief Complaint  Patient presents with  . Sickle Cell Anemia    Subjective:  HPI [name] is a 37 y.o. male with a history of sickle cell anemia, which has mostly remained clinically stable. He has a history of acute chest syndrome and CAP in the past and reports that developing arespiratory illness during the winter months is always his concern. He is current with his pneumonia vaccine. Reports chronic pain, although he manages without pain medication mostly. Pain intensity at it's best is a 1-2/10. Reports no alcohol or illicit drug use. Denies chest pain, URI symptoms, constipation,urinary issues, abdominal pain, shortness of breath, headache, dizziness, depression, and anxiety. Needs a refill of pain medication. Reports no other issues today. Social History   Socioeconomic History  . Marital status: Single    Spouse name: Not on file  . Number of children: Not on file  . Years of education: Not on file  . Highest education level: Not on file  Social Needs  . Financial resource strain: Not on file  . Food insecurity - worry: Not on file  . Food insecurity - inability: Not on file  . Transportation needs - medical: Not on file  . Transportation needs - non-medical: Not on file  Occupational History  . Not on file  Tobacco Use  . Smoking status: Never Smoker  . Smokeless tobacco: Never Used  Substance and Sexual Activity  . Alcohol use: No  . Drug use: No  . Sexual activity: Yes  Other Topics Concern  . Not on file  Social History Narrative  . Not on file    Family History  Problem Relation Age of Onset  . Sickle cell anemia Mother   . Sickle cell trait Father   . Sickle cell trait Sister   . Sickle cell trait Brother   . Cancer Maternal Grandmother    Review of Systems  Constitutional: Negative.   Respiratory: Negative.   Cardiovascular: Negative.    Gastrointestinal: Negative.   Musculoskeletal: Positive for arthralgias.  Skin: Negative.   Neurological: Negative.   Psychiatric/Behavioral: Negative.     Patient Active Problem List   Diagnosis Date Noted  . Sickle cell pain crisis ([place]) 09/08/2015  . Routine general medical examination at a health care facility 02/11/2015  . Acute chest syndrome ([place]) 10/14/2014  . Hypokalemia 10/14/2014  . Anemia of chronic disease 10/14/2014  . CAP (community acquired pneumonia) 10/12/2014  . Sickle cell crisis ([place]) 10/10/2014  . Screening for STDs (sexually transmitted diseases) 01/09/2014  . Pain of right clavicle 11/14/2013  . Underweight 08/26/2013  . Need for Tdap vaccination 08/26/2013  . Hb-SS disease without crisis ([place]) 08/26/2013  . Sickle cell anemia with crisis ([place]) 08/21/2013  . Chronic pain syndrome 02/18/2013  . Sickle cell anemia ([place]) 01/18/2013  . Pain 01/18/2013    No Known Allergies  Prior to Admission medications   Medication Sig Start Date End Date Taking? Authorizing Provider  folic acid (FOLVITE) 811 MCG tablet Take 400 mcg by mouth daily.   Yes [provider]  hydroxyurea (HYDREA) 500 MG capsule TAKE 2 CAPSULES BY MOUTH TWICE A DAY MAY TAKE WITH FOOD TO MINIMIZE GI SIDE EFFECTS 01/31/17  Yes Jegede, Olugbemiga E, MD  oxyCODONE-acetaminophen (PERCOCET) 10-325 MG tablet Take 1 tablet by mouth every 4 (four) hours as needed for pain. 07/17/17  Yes Dorena Dew, FNP    Past Medical, Surgical Family and Social History reviewed and updated.    Objective:   Today's Vitals   08/23/17 1037  BP: 132/72  Pulse: 60  Resp: 14  Temp: 98.3 F (36.8 C)  TempSrc: Oral  SpO2: 98%  Weight: 155 lb (70.3 kg)  Height: 6\' 1"  (1.854 m)    Wt Readings from Last 3 Encounters:  08/23/17 155 lb (70.3 kg)  04/19/17 150 lb (68 kg)  03/08/17 150 lb (68 kg)    Physical Exam  Constitutional: He is oriented to person, place, and time. He appears well-developed  and well-nourished.  HENT:  Head: Normocephalic and atraumatic.  Eyes: Conjunctivae are normal. Pupils are equal, round, and reactive to light.  Neck: Normal range of motion. Neck supple. No thyromegaly present.  Cardiovascular: Normal rate, regular rhythm, normal heart sounds and intact distal pulses.  Pulmonary/Chest: Effort normal and breath sounds normal.  Abdominal: Soft. Bowel sounds are normal.  Musculoskeletal: Normal range of motion.  Lymphadenopathy:    He has no cervical adenopathy.  Neurological: He is alert and oriented to person, place, and time. He has normal reflexes.  Skin: Skin is warm and dry.  Psychiatric: He has a normal mood and affect. His behavior is normal. Judgment and thought content normal.    Assessment & Plan:  1. Hb-SS disease without crisis ([place]), chronic stable.  Continue Hydrea. We discussed the need for good hydration, monitoring of hydration status, avoidance of heat, cold, stress, and infection triggers. We discussed the risks and benefits of Hydrea, including bone marrow suppression, the possibility of GI upset, skin ulcers, hair thinning, and teratogenicity. The patient was reminded of the need to seek medical attention for any symptoms of bleeding, anemia, or infection. Continue folic acid 1 mg daily to prevent aplastic bone marrow crises.   Pulmonary evaluation - Patient denies severe recurrent wheezes, shortness of breath with exercise, or persistent cough. If these symptoms develop, pulmonary function tests with spirometry will be ordered, and if abnormal, plan on referral to Pulmonology for further evaluation.  Eye - High risk of proliferative retinopathy. Annual eye exam with retinal exam recommended to patient, the patient has had eye exam this year.  Immunization status - Yearly influenza vaccination is recommended, as well as being up to date with Meningococcal and Pneumococcal vaccines.   Acute and chronic painful episodes - We agreed on  Opiate dose and amount of pills  per month. We discussed that pt is to receive Schedule II prescriptions only from our clinic. Pt is also aware that the prescription history is available to [place] online through the [facility] CSRS. Controlled substance agreement reviewed and signed. We reminded [name] that all patients receiving Schedule II narcotics must be seen for follow within one month of prescription being requested. We reviewed the terms of our pain agreement, including the need to keep medicines in a safe locked location away from children or pets, and the need to report excess sedation or constipation, measures to avoid constipation, and policies related to early refills and stolen prescriptions. According to the  Chronic Pain Initiative program, we have reviewed details related to analgesia, adverse effects and aberrant behaviors.   2. Chronic pain syndrome, currently controlled with current pain medication regimen.  No changes today. - oxyCODONE-acetaminophen (PERCOCET) 10-325 MG tablet; Take 1 tablet by mouth every 4 (four) hours as needed for pain.  Dispense: 90 tablet; Refill: 0   Return in about 12  weeks for Sickle Cell Disease/Pain.  Urine drug screen needed at next follow-up.  The patient was given clear instructions to go to ER or return to medical center if symptoms don't improve, worsen or new problems develop. The patient verbalized understanding. The patient was told to call to get lab results if they haven't heard anything in the next week.       Carroll Sage. Kenton Kingfisher, MSN, FNP-C [facility]  [address] [place], [place] [address] [phone]

## 2017-08-23 NOTE — Patient Instructions (Signed)
Continue to hydrate with 8-10 8 ounce glasses of water daily.  Continue to consistently take Folic Acid with Hydroxyurea.  You will be notified of any abnormal lab results.     Sickle Cell Anemia, Adult Sickle cell anemia is a condition where your red blood cells are shaped like sickles. Red blood cells carry oxygen through the body. Sickle-shaped red blood cells do not live as long as normal red blood cells. They also clump together and block blood from flowing through the blood vessels. These things prevent the body from getting enough oxygen. Sickle cell anemia causes organ damage and pain. It also increases the risk of infection. Follow these instructions at home:  Drink enough fluid to keep your pee (urine) clear or pale yellow. Drink more in hot weather and during exercise.  Do not smoke. Smoking lowers oxygen levels in the blood.  Only take over-the-counter or prescription medicines as told by your doctor.  Take antibiotic medicines as told by your doctor. Make sure you finish them even if you start to feel better.  Take supplements as told by your doctor.  Consider wearing a medical alert bracelet. This tells anyone caring for you in an emergency of your condition.  When traveling, keep your medical information, doctors' names, and the medicines you take with you at all times.  If you have a fever, do not take fever medicines right away. This could cover up a problem. Tell your doctor.  Keep all follow-up visits with your doctor. Sickle cell anemia requires regular medical care. Contact a doctor if: You have a fever. Get help right away if:  You feel dizzy or faint.  You have new belly (abdominal) pain, especially on the left side near the stomach area.  You have a lasting, often uncomfortable and painful erection of the penis (priapism). If it is not treated right away, you will become unable to have sex (impotence).  You have numbness in your arms or legs or you have  a hard time moving them.  You have a hard time talking.  You have a fever or lasting symptoms for more than 2-3 days.  You have a fever and your symptoms suddenly get worse.  You have signs or symptoms of infection. These include: ? Chills. ? Being more tired than normal (lethargy). ? Irritability. ? Poor eating. ? Throwing up (vomiting).  You have pain that is not helped with medicine.  You have shortness of breath.  You have pain in your chest.  You are coughing up pus-like or bloody mucus.  You have a stiff neck.  Your feet or hands swell or have pain.  Your belly looks bloated.  Your joints hurt. This information is not intended to replace advice given to you by your health care provider. Make sure you discuss any questions you have with your health care provider. Document Released: 05/22/2013 Document Revised: 01/07/2016 Document Reviewed: 03/13/2013 Elsevier Interactive Patient Education  2017 [employer].

## 2017-08-24 LAB — CBC WITH DIFFERENTIAL/PLATELET
BASOS: 1 %
Basophils Absolute: 0 10*3/uL (ref 0.0–0.2)
EOS (ABSOLUTE): 0.4 10*3/uL (ref 0.0–0.4)
EOS: 6 %
HEMATOCRIT: 27.2 % — AB (ref 37.5–51.0)
HEMOGLOBIN: 9.5 g/dL — AB (ref 13.0–17.7)
Immature Grans (Abs): 0 10*3/uL (ref 0.0–0.1)
Immature Granulocytes: 0 %
Lymphocytes Absolute: 2.3 10*3/uL (ref 0.7–3.1)
Lymphs: 34 %
MCH: 31.4 pg (ref 26.6–33.0)
MCHC: 34.9 g/dL (ref 31.5–35.7)
MCV: 90 fL (ref 79–97)
MONOCYTES: 10 %
Monocytes Absolute: 0.7 10*3/uL (ref 0.1–0.9)
NEUTROS ABS: 3.4 10*3/uL (ref 1.4–7.0)
Neutrophils: 49 %
Platelets: 408 10*3/uL — ABNORMAL HIGH (ref 150–379)
RBC: 3.03 x10E6/uL — AB (ref 4.14–5.80)
RDW: 18.4 % — ABNORMAL HIGH (ref 12.3–15.4)
WBC: 6.8 10*3/uL (ref 3.4–10.8)

## 2017-08-24 LAB — COMPREHENSIVE METABOLIC PANEL
ALBUMIN: 4.6 g/dL (ref 3.5–5.5)
ALK PHOS: 66 IU/L (ref 39–117)
ALT: 23 IU/L (ref 0–44)
AST: 32 IU/L (ref 0–40)
Albumin/Globulin Ratio: 1.4 (ref 1.2–2.2)
BUN / CREAT RATIO: 7 — AB (ref 9–20)
BUN: 6 mg/dL (ref 6–20)
Bilirubin Total: 3.2 mg/dL — ABNORMAL HIGH (ref 0.0–1.2)
CO2: 26 mmol/L (ref 20–29)
CREATININE: 0.83 mg/dL (ref 0.76–1.27)
Calcium: 9.9 mg/dL (ref 8.7–10.2)
Chloride: 102 mmol/L (ref 96–106)
GFR calc Af Amer: 133 mL/min/{1.73_m2} (ref >59)
GFR calc non Af Amer: 115 mL/min/{1.73_m2} (ref >59)
GLOBULIN, TOTAL: 3.4 g/dL (ref 1.5–4.5)
Glucose: 93 mg/dL (ref 65–99)
Potassium: 4.7 mmol/L (ref 3.5–5.2)
SODIUM: 141 mmol/L (ref 134–144)
Total Protein: 8 g/dL (ref 6.0–8.5)

## 2017-08-24 LAB — RETICULOCYTES: RETIC CT PCT: 6.4 % — AB (ref 0.6–2.6)

## 2017-08-31 NOTE — Addendum Note (Signed)
Addended by: Scot Jun on: 08/31/2017 02:42 PM   Modules accepted: Level of Service

## 2017-09-11 ENCOUNTER — Telehealth: Payer: Self-pay

## 2017-09-12 ENCOUNTER — Other Ambulatory Visit: Payer: Self-pay | Admitting: Family Medicine

## 2017-09-12 DIAGNOSIS — D571 Sickle-cell disease without crisis: Secondary | ICD-10-CM

## 2017-09-12 MED ORDER — OXYCODONE-ACETAMINOPHEN 10-325 MG PO TABS: 1.0000 | ORAL_TABLET | ORAL | 0 refills | Status: DC | PRN

## 2017-09-12 NOTE — Progress Notes (Signed)
Reviewed [place] Substance Reporting system prior to prescribing opiate medications. No inconsistencies noted.   Meds ordered this encounter  Medications  . oxyCODONE-acetaminophen (PERCOCET) 10-325 MG tablet    Sig: Take 1 tablet by mouth every 4 (four) hours as needed for pain.    Dispense:  90 tablet    Refill:  0    Order Specific Question:   Supervising Provider    Answer:   Tresa Garter [[id number]]    Donia Pounds  MSN, FNP-C Patient [facility] [address] [place], [place] [address] [phone]

## 2017-09-27 ENCOUNTER — Telehealth: Payer: Self-pay

## 2017-09-28 ENCOUNTER — Other Ambulatory Visit: Payer: Self-pay | Admitting: Family Medicine

## 2017-09-28 DIAGNOSIS — D571 Sickle-cell disease without crisis: Secondary | ICD-10-CM

## 2017-09-28 DIAGNOSIS — Z79891 Long term (current) use of opiate analgesic: Secondary | ICD-10-CM

## 2017-09-28 MED ORDER — OXYCODONE-ACETAMINOPHEN 10-325 MG PO TABS: 1.0000 | ORAL_TABLET | ORAL | 0 refills | Status: DC | PRN

## 2017-09-28 NOTE — Progress Notes (Signed)
Reviewed [place] Substance Reporting system prior to prescribing opiate medications. No inconsistencies noted. Indication for chronic  Last UDS date: 07/01/2017 Pain contract signed (Y/N): Yes Date narcotic database last reviewed (include red flags): Today   Meds ordered this encounter  Medications  . oxyCODONE-acetaminophen (PERCOCET) 10-325 MG tablet    Sig: Take 1 tablet by mouth every 4 (four) hours as needed for pain.    Dispense:  90 tablet    Refill:  0    Order Specific Question:   Supervising Provider    Answer:   Tresa Garter [[id number]]    Donia Pounds  MSN, FNP-C Patient [facility] [address] [place], [place] [address] [phone]

## 2017-09-29 ENCOUNTER — Other Ambulatory Visit: Payer: Medicare Other

## 2017-09-29 DIAGNOSIS — D571 Sickle-cell disease without crisis: Secondary | ICD-10-CM

## 2017-09-29 DIAGNOSIS — Z79891 Long term (current) use of opiate analgesic: Secondary | ICD-10-CM | POA: Diagnosis not present

## 2017-10-02 ENCOUNTER — Other Ambulatory Visit: Payer: Self-pay | Admitting: Internal Medicine

## 2017-10-06 LAB — TOXASSURE SELECT 13 (MW), URINE

## 2017-10-16 ENCOUNTER — Telehealth: Payer: Self-pay

## 2017-10-16 ENCOUNTER — Other Ambulatory Visit: Payer: Self-pay | Admitting: Family Medicine

## 2017-10-16 DIAGNOSIS — D571 Sickle-cell disease without crisis: Secondary | ICD-10-CM

## 2017-10-16 MED ORDER — OXYCODONE-ACETAMINOPHEN 10-325 MG PO TABS: 1.0000 | ORAL_TABLET | ORAL | 0 refills | Status: DC | PRN

## 2017-10-16 NOTE — Progress Notes (Signed)
Reviewed [place] Substance Reporting system prior to prescribing opiate medications. No inconsistencies noted.   Meds ordered this encounter  Medications  . oxyCODONE-acetaminophen (PERCOCET) 10-325 MG tablet    Sig: Take 1 tablet by mouth every 4 (four) hours as needed for pain.    Dispense:  90 tablet    Refill:  0    Order Specific Question:   Supervising Provider    Answer:   Tresa Garter [[id number]]    Donia Pounds  MSN, FNP-C Patient [facility] [address] [place], [place] [address] [phone]

## 2017-11-06 ENCOUNTER — Other Ambulatory Visit: Payer: Self-pay | Admitting: Family Medicine

## 2017-11-06 ENCOUNTER — Telehealth: Payer: Self-pay

## 2017-11-06 DIAGNOSIS — D571 Sickle-cell disease without crisis: Secondary | ICD-10-CM

## 2017-11-06 MED ORDER — OXYCODONE-ACETAMINOPHEN 10-325 MG PO TABS: 1.0000 | ORAL_TABLET | ORAL | 0 refills | Status: DC | PRN

## 2017-11-06 NOTE — Progress Notes (Signed)
Reviewed [place] Substance Reporting system prior to prescribing opiate medications. No inconsistencies noted.  Meds ordered this encounter  Medications  . oxyCODONE-acetaminophen (PERCOCET) 10-325 MG tablet    Sig: Take 1 tablet by mouth every 4 (four) hours as needed for pain.    Dispense:  90 tablet    Refill:  0    Order Specific Question:   Supervising Provider    Answer:   Tresa Garter [[id number]]    Donia Pounds  MSN, FNP-C Patient [facility] [address] [place], [place] [address] [phone]

## 2017-11-22 ENCOUNTER — Encounter: Payer: Self-pay | Admitting: Family Medicine

## 2017-11-22 ENCOUNTER — Ambulatory Visit (INDEPENDENT_AMBULATORY_CARE_PROVIDER_SITE_OTHER): Payer: Medicare Other | Admitting: Family Medicine

## 2017-11-22 VITALS — BP 122/76 | HR 74 | Temp 98.0°F | Resp 14 | Ht 73.0 in | Wt 155.4 lb

## 2017-11-22 DIAGNOSIS — D571 Sickle-cell disease without crisis: Secondary | ICD-10-CM | POA: Diagnosis not present

## 2017-11-22 DIAGNOSIS — Z79891 Long term (current) use of opiate analgesic: Secondary | ICD-10-CM

## 2017-11-22 LAB — POCT URINALYSIS DIP (MANUAL ENTRY)
BILIRUBIN UA: NEGATIVE
BILIRUBIN UA: NEGATIVE mg/dL
Blood, UA: NEGATIVE
Glucose, UA: NEGATIVE mg/dL
LEUKOCYTES UA: NEGATIVE
Nitrite, UA: NEGATIVE
PROTEIN UA: NEGATIVE mg/dL
Spec Grav, UA: 1.015 (ref 1.010–1.025)
Urobilinogen, UA: 1 E.U./dL
pH, UA: 6 (ref 5.0–8.0)

## 2017-11-22 MED ORDER — OXYCODONE-ACETAMINOPHEN 10-325 MG PO TABS: 1.0000 | ORAL_TABLET | ORAL | 0 refills | Status: DC | PRN

## 2017-11-22 NOTE — Progress Notes (Signed)
Subjective:    Patient ID: [name], male    DOB: 1981/03/20, 37 y.o.   MRN: [id number]  HPI   [name],  a 37 year old male with a history of sickle cell disease, HbSS presents for a 3 month follow up. He presents for 3 month follow up and medication management. Patient has been taking medications consistently. Sickle cell  pain is primarily to lower back. Patient endorses pain in neck, shoulder, and lower back this am characterized as pressure and soreness,  which was moderately relieved by Percocet 10-325 mg.  Current pain intensity is 1/10.  Patient denies nausea, vomiting, diarrhea, constipation, shortness of breath, chest pains, or heart palpitations.  Past Medical History:  Diagnosis Date  . Sickle cell anemia (HCC)    Social History   Socioeconomic History  . Marital status: Single    Spouse name: Not on file  . Number of children: Not on file  . Years of education: Not on file  . Highest education level: Not on file  Occupational History  . Not on file  Social Needs  . Financial resource strain: Not on file  . Food insecurity:    Worry: Not on file    Inability: Not on file  . Transportation needs:    Medical: Not on file    Non-medical: Not on file  Tobacco Use  . Smoking status: Never Smoker  . Smokeless tobacco: Never Used  Substance and Sexual Activity  . Alcohol use: No  . Drug use: No  . Sexual activity: Yes  Lifestyle  . Physical activity:    Days per week: Not on file    Minutes per session: Not on file  . Stress: Not on file  Relationships  . Social connections:    Talks on phone: Not on file    Gets together: Not on file    Attends religious service: Not on file    Active member of club or organization: Not on file    Attends meetings of clubs or organizations: Not on file    Relationship status: Not on file  . Intimate partner violence:    Fear of current or ex partner: Not on file    Emotionally abused: Not on file    Physically  abused: Not on file    Forced sexual activity: Not on file  Other Topics Concern  . Not on file  Social History Narrative  . Not on file   Immunization History  Administered Date(s) Administered  . Influenza,inj,Quad PF,6+ Mos 07/03/2014  . Pneumococcal Conjugate-13 01/01/2015  . Tdap 08/26/2013    No Known Allergies  Review of Systems  Constitutional: Negative.   HENT: Negative.   Eyes: Negative.   Respiratory: Negative.   Cardiovascular: Negative.   Gastrointestinal: Negative.   Endocrine: Negative.   Genitourinary: Negative.   Musculoskeletal: Positive for back pain and neck pain.       Patient complain of lower back and soreness on left side of neck and shoulder.  Skin: Negative.   Allergic/Immunologic: Negative.   Neurological: Negative.   Psychiatric/Behavioral: Negative.        Objective:   Physical Exam  Constitutional: He is oriented to person, place, and time. He appears well-developed and well-nourished.  HENT:  Head: Normocephalic.  Right Ear: External ear normal.  Left Ear: External ear normal.  Eyes: Pupils are equal, round, and reactive to light. Conjunctivae and EOM are normal.  Neck: Normal range of motion. Neck supple.  Cardiovascular: Normal rate, regular rhythm and normal heart sounds.  Pulmonary/Chest: Effort normal and breath sounds normal.  Abdominal: Soft. Bowel sounds are normal.  Musculoskeletal: Normal range of motion.  Neurological: He is alert and oriented to person, place, and time. He has normal reflexes.  Skin: Skin is warm and dry.  Psychiatric: He has a normal mood and affect. His behavior is normal. Judgment and thought content normal.       BP 122/76 (BP Location: Left Arm, Patient Position: Sitting, Cuff Size: Small)   Pulse 74   Temp 98 F (36.7 C) (Oral)   Resp 14   Ht 6\' 1"  (1.854 m)   Wt 155 lb 6.4 oz (70.5 kg)   SpO2 96%   BMI 20.50 kg/m  Assessment & Plan:  1. Hb-SS disease without crisis ([place]) Sickle cell  disease - Continue Hydrea at current dosage. We discussed the need for good hydration, monitoring of hydration status, avoidance of heat, cold, stress, and infection triggers. We discussed the risks and benefits of Hydrea, including bone marrow suppression, the possibility of GI upset, skin ulcers, hair thinning, and teratogenicity. The patient was reminded of the need to seek medical attention of any symptoms of bleeding, anemia, or infection.  Continue folic acid 366 mcg daily to prevent aplastic bone marrow crises.   Pulmonary evaluation - Patient denies severe recurrent wheezes, shortness of breath with exercise, or persistent cough. If these symptoms develop, pulmonary function tests with spirometry will be ordered, and if abnormal, plan on referral to Pulmonology for further evaluation.  Cardiac - Routine screening for pulmonary hypertension is not recommended.  Eye - High risk of proliferative retinopathy. Annual eye exam with retinal exam recommended to patient. Last eye exam 2 years ago   Immunization status - Up to date  Acute and chronic painful episodes - We agreed on Percocet 10-325 mg every 4 hours as needed for moderate to severe pain # 90.  We discussed that pt is to receive his Schedule II prescriptions only from [place]. Pt is also aware that the prescription history is available to [place] online through the [facility] CSRS. Controlled substance agreement signed previously. We reminded patient that all patients receiving Schedule II narcotics must be seen for follow within one month of prescription being requested. We reviewed the terms of our pain agreement, including the need to keep medicines in a safe locked location away from children or pets, and the need to report excess sedation or constipation, measures to avoid constipation, and policies related to early refills and stolen prescriptions. According to the [place] Chronic Pain Initiative program, we have reviewed details related to analgesia, adverse  effects, aberrant behaviors. Reviewed [place] Substance Reporting system prior to prescribing opiate medications. No inconsistencies noted.   - POCT urinalysis dipstick - oxyCODONE-acetaminophen (PERCOCET) 10-325 MG tablet; Take 1 tablet by mouth every 4 (four) hours as needed for pain.  Dispense: 90 tablet; Refill: 0 - Ambulatory referral to Hematology - Reticulocytes - CBC with Differential - Comprehensive metabolic panel  2. Chronic prescription opiate use Reviewed [place] Substance Reporting system prior to prescribing opiate medications. No inconsistencies noted.   Pain management of sickle cell anemia - 440347 9+OXYCODONE+CRT-UNBUND; Future - Ambulatory referral to Hematology - 425956 9+OXYCODONE+CRT-UNBUND - Comprehensive metabolic panel  RTC: 2 months for medication management   Donia Pounds  MSN, FNP-C Patient [facility] [address] [place], [place] [address] [phone]

## 2017-11-22 NOTE — Patient Instructions (Signed)
Recommend that you continue medication regimen.   Discussed the importance of drinking 64 ounces of water daily. To help prevent pain crises, it is important to drink plenty of water throughout the day. This is because dehydration of red blood cells may lead to the sickling process.      Sickle Cell Anemia, Adult Sickle cell anemia is a condition where your red blood cells are shaped like sickles. Red blood cells carry oxygen through the body. Sickle-shaped red blood cells do not live as long as normal red blood cells. They also clump together and block blood from flowing through the blood vessels. These things prevent the body from getting enough oxygen. Sickle cell anemia causes organ damage and pain. It also increases the risk of infection. Follow these instructions at home:  Drink enough fluid to keep your pee (urine) clear or pale yellow. Drink more in hot weather and during exercise.  Do not smoke. Smoking lowers oxygen levels in the blood.  Only take over-the-counter or prescription medicines as told by your doctor.  Take antibiotic medicines as told by your doctor. Make sure you finish them even if you start to feel better.  Take supplements as told by your doctor.  Consider wearing a medical alert bracelet. This tells anyone caring for you in an emergency of your condition.  When traveling, keep your medical information, doctors' names, and the medicines you take with you at all times.  If you have a fever, do not take fever medicines right away. This could cover up a problem. Tell your doctor.  Keep all follow-up visits with your doctor. Sickle cell anemia requires regular medical care. Contact a doctor if: You have a fever. Get help right away if:  You feel dizzy or faint.  You have new belly (abdominal) pain, especially on the left side near the stomach area.  You have a lasting, often uncomfortable and painful erection of the penis (priapism). If it is not treated  right away, you will become unable to have sex (impotence).  You have numbness in your arms or legs or you have a hard time moving them.  You have a hard time talking.  You have a fever or lasting symptoms for more than 2-3 days.  You have a fever and your symptoms suddenly get worse.  You have signs or symptoms of infection. These include: ? Chills. ? Being more tired than normal (lethargy). ? Irritability. ? Poor eating. ? Throwing up (vomiting).  You have pain that is not helped with medicine.  You have shortness of breath.  You have pain in your chest.  You are coughing up pus-like or bloody mucus.  You have a stiff neck.  Your feet or hands swell or have pain.  Your belly looks bloated.  Your joints hurt. This information is not intended to replace advice given to you by your health care provider. Make sure you discuss any questions you have with your health care provider. Document Released: 05/22/2013 Document Revised: 01/07/2016 Document Reviewed: 03/13/2013 Elsevier Interactive Patient Education  2017 [employer].

## 2017-11-23 LAB — COMPREHENSIVE METABOLIC PANEL
ALBUMIN: 4.5 g/dL (ref 3.5–5.5)
ALK PHOS: 80 IU/L (ref 39–117)
ALT: 28 IU/L (ref 0–44)
AST: 36 IU/L (ref 0–40)
Albumin/Globulin Ratio: 1.4 (ref 1.2–2.2)
BUN / CREAT RATIO: 8 — AB (ref 9–20)
BUN: 7 mg/dL (ref 6–20)
Bilirubin Total: 1.9 mg/dL — ABNORMAL HIGH (ref 0.0–1.2)
CO2: 24 mmol/L (ref 20–29)
CREATININE: 0.87 mg/dL (ref 0.76–1.27)
Calcium: 9.6 mg/dL (ref 8.7–10.2)
Chloride: 101 mmol/L (ref 96–106)
GFR, EST AFRICAN AMERICAN: 129 mL/min/{1.73_m2} (ref >59)
GFR, EST NON AFRICAN AMERICAN: 112 mL/min/{1.73_m2} (ref >59)
GLOBULIN, TOTAL: 3.3 g/dL (ref 1.5–4.5)
Glucose: 103 mg/dL — ABNORMAL HIGH (ref 65–99)
Potassium: 4.4 mmol/L (ref 3.5–5.2)
SODIUM: 139 mmol/L (ref 134–144)
TOTAL PROTEIN: 7.8 g/dL (ref 6.0–8.5)

## 2017-11-23 LAB — CBC WITH DIFFERENTIAL/PLATELET
BASOS: 1 %
Basophils Absolute: 0 10*3/uL (ref 0.0–0.2)
EOS (ABSOLUTE): 0.3 10*3/uL (ref 0.0–0.4)
Eos: 5 %
HEMATOCRIT: 30.3 % — AB (ref 37.5–51.0)
Hemoglobin: 10.3 g/dL — ABNORMAL LOW (ref 13.0–17.7)
Immature Grans (Abs): 0 10*3/uL (ref 0.0–0.1)
Immature Granulocytes: 0 %
Lymphocytes Absolute: 1.9 10*3/uL (ref 0.7–3.1)
Lymphs: 35 %
MCH: 32.4 pg (ref 26.6–33.0)
MCHC: 34 g/dL (ref 31.5–35.7)
MCV: 95 fL (ref 79–97)
MONOS ABS: 0.5 10*3/uL (ref 0.1–0.9)
Monocytes: 9 %
NEUTROS ABS: 2.8 10*3/uL (ref 1.4–7.0)
Neutrophils: 50 %
PLATELETS: 448 10*3/uL — AB (ref 150–379)
RBC: 3.18 x10E6/uL — ABNORMAL LOW (ref 4.14–5.80)
RDW: 19.3 % — AB (ref 12.3–15.4)
WBC: 5.6 10*3/uL (ref 3.4–10.8)

## 2017-11-23 LAB — RETICULOCYTES: Retic Ct Pct: 7.9 % — ABNORMAL HIGH (ref 0.6–2.6)

## 2017-11-25 LAB — OXYCODONE/OXYMORPHONE CONFIRM
OXYCODONE/OXYMORPH: POSITIVE — AB
OXYCODONE: 635 ng/mL
OXYCODONE: POSITIVE — AB
OXYMORPHONE CONFIRM: 1028 ng/mL
OXYMORPHONE: POSITIVE — AB

## 2017-11-25 LAB — 737588 9+OXYCODONE+CRT-UNBUND
Amphetamine Scrn, Ur: NEGATIVE ng/mL
BARBITURATE SCREEN URINE: NEGATIVE ng/mL
BENZODIAZEPINE SCREEN, URINE: NEGATIVE ng/mL
CANNABINOIDS UR QL SCN: NEGATIVE ng/mL
Cocaine (Metab) Scrn, Ur: NEGATIVE ng/mL
Creatinine(Crt), U: 106.9 mg/dL (ref 20.0–300.0)
Methadone Screen, Urine: NEGATIVE ng/mL
Opiate Scrn, Ur: NEGATIVE ng/mL
PH UR, DRUG SCRN: 5.9 (ref 4.5–8.9)
Phencyclidine Qn, Ur: NEGATIVE ng/mL
Propoxyphene Scrn, Ur: NEGATIVE ng/mL

## 2017-12-11 ENCOUNTER — Other Ambulatory Visit: Payer: Self-pay | Admitting: Family Medicine

## 2017-12-11 ENCOUNTER — Telehealth: Payer: Self-pay

## 2017-12-11 DIAGNOSIS — D571 Sickle-cell disease without crisis: Secondary | ICD-10-CM

## 2017-12-11 MED ORDER — OXYCODONE-ACETAMINOPHEN 10-325 MG PO TABS: 1.0000 | ORAL_TABLET | ORAL | 0 refills | Status: DC | PRN

## 2017-12-11 NOTE — Progress Notes (Signed)
Reviewed [place] Substance Reporting system prior to prescribing opiate medications. No inconsistencies noted.   Meds ordered this encounter  Medications  . oxyCODONE-acetaminophen (PERCOCET) 10-325 MG tablet    Sig: Take 1 tablet by mouth every 4 (four) hours as needed for pain.    Dispense:  90 tablet    Refill:  0    Order Specific Question:   Supervising Provider    Answer:   Tresa Garter [[id number]]    Donia Pounds  MSN, FNP-C Patient [facility] [address] [place], [place] [address] [phone]

## 2017-12-27 ENCOUNTER — Telehealth: Payer: Self-pay

## 2018-01-02 ENCOUNTER — Other Ambulatory Visit: Payer: Self-pay | Admitting: Family Medicine

## 2018-01-02 DIAGNOSIS — D571 Sickle-cell disease without crisis: Secondary | ICD-10-CM

## 2018-01-02 MED ORDER — OXYCODONE-ACETAMINOPHEN 10-325 MG PO TABS: 1.0000 | ORAL_TABLET | ORAL | 0 refills | Status: DC | PRN

## 2018-01-02 NOTE — Progress Notes (Signed)
Reviewed [place] Substance Reporting system prior to prescribing opiate medications. No inconsistencies noted.  Previous UDS 11/22/2017.   Meds ordered this encounter  Medications  . oxyCODONE-acetaminophen (PERCOCET) 10-325 MG tablet    Sig: Take 1 tablet by mouth every 4 (four) hours as needed for pain.    Dispense:  90 tablet    Refill:  0    Order Specific Question:   Supervising Provider    Answer:   Tresa Garter [[id number]]     Donia Pounds  MSN, FNP-C Patient [facility] [address] [place], [place] [address] [phone]

## 2018-01-02 NOTE — Telephone Encounter (Signed)
Refill request for percocet. Please advise. Thanks!

## 2018-01-22 ENCOUNTER — Ambulatory Visit (INDEPENDENT_AMBULATORY_CARE_PROVIDER_SITE_OTHER): Payer: Medicare Other | Admitting: Family Medicine

## 2018-01-22 ENCOUNTER — Encounter: Payer: Self-pay | Admitting: Family Medicine

## 2018-01-22 VITALS — BP 114/64 | HR 65 | Temp 98.2°F | Resp 16 | Ht 73.0 in | Wt 157.0 lb

## 2018-01-22 DIAGNOSIS — D571 Sickle-cell disease without crisis: Secondary | ICD-10-CM | POA: Diagnosis not present

## 2018-01-22 DIAGNOSIS — Z79891 Long term (current) use of opiate analgesic: Secondary | ICD-10-CM | POA: Diagnosis not present

## 2018-01-22 LAB — POCT URINALYSIS DIPSTICK
Bilirubin, UA: NEGATIVE
Glucose, UA: NEGATIVE
Ketones, UA: NEGATIVE
Leukocytes, UA: NEGATIVE
Nitrite, UA: NEGATIVE
PH UA: 7 (ref 5.0–8.0)
PROTEIN UA: NEGATIVE
RBC UA: NEGATIVE
Spec Grav, UA: 1.015 (ref 1.010–1.025)
Urobilinogen, UA: 1 E.U./dL

## 2018-01-22 MED ORDER — OXYCODONE-ACETAMINOPHEN 10-325 MG PO TABS: 1.0000 | ORAL_TABLET | ORAL | 0 refills | Status: DC | PRN

## 2018-01-22 NOTE — Progress Notes (Signed)
ur   Subjective:    Patient ID: [name], male    DOB: [birth year], 37 y.o.   MRN: [id number]  HPI  [name], a 37 year old male with a history of sickle cell anemia, HbSS presents for a  follow up of sickle cell anemia. [name] states that he feels well and is without current complaint. He is taking medications consistently.  He reports that he had pain in his lower back this am that was consistent with typical sickle cell pain. He last had Percocet 10-325 mg this am  with maximum relief. His current pain intensity is 1-2/10. He maintains that sickle cell crises typically resolve with rest, hydration, and opiate medications. He denies fatigue, shortness of breath, chest pains, nausea, vomiting, diarrhea, or constipation. Past Medical History:  Diagnosis Date  . Sickle cell anemia (HCC)    Immunization History  Administered Date(s) Administered  . Influenza,inj,Quad PF,6+ Mos 07/03/2014  . Pneumococcal Conjugate-13 01/01/2015  . Tdap 08/26/2013  No Known Allergies Social History   Socioeconomic History  . Marital status: Single    Spouse name: Not on file  . Number of children: Not on file  . Years of education: Not on file  . Highest education level: Not on file  Occupational History  . Not on file  Social Needs  . Financial resource strain: Not on file  . Food insecurity:    Worry: Not on file    Inability: Not on file  . Transportation needs:    Medical: Not on file    Non-medical: Not on file  Tobacco Use  . Smoking status: Never Smoker  . Smokeless tobacco: Never Used  Substance and Sexual Activity  . Alcohol use: No  . Drug use: No  . Sexual activity: Yes  Lifestyle  . Physical activity:    Days per week: Not on file    Minutes per session: Not on file  . Stress: Not on file  Relationships  . Social connections:    Talks on phone: Not on file    Gets together: Not on file    Attends religious service: Not on file    Active member of club or  organization: Not on file    Attends meetings of clubs or organizations: Not on file    Relationship status: Not on file  . Intimate partner violence:    Fear of current or ex partner: Not on file    Emotionally abused: Not on file    Physically abused: Not on file    Forced sexual activity: Not on file  Other Topics Concern  . Not on file  Social History Narrative  . Not on file   Immunization History  Administered Date(s) Administered  . Influenza,inj,Quad PF,6+ Mos 07/03/2014  . Pneumococcal Conjugate-13 01/01/2015  . Tdap 08/26/2013   Review of Systems  Constitutional: Negative.  Negative for fatigue.  HENT: Negative.   Eyes: Negative.   Respiratory: Negative.   Cardiovascular: Negative.  Negative for chest pain, palpitations and leg swelling.  Gastrointestinal: Negative.   Endocrine: Negative for polydipsia, polyphagia and polyuria.  Musculoskeletal: Positive for myalgias.  Skin: Negative.   Allergic/Immunologic: Negative.   Neurological: Negative.  Negative for dizziness.  Hematological: Negative.   Psychiatric/Behavioral: Negative.        Objective:   Physical Exam  Constitutional: He is oriented to person, place, and time. He appears well-developed.  HENT:  Head: Normocephalic and atraumatic.  Right Ear: External ear normal.  Left  Ear: External ear normal.  Mouth/Throat: Oropharynx is clear and moist.  Eyes: Pupils are equal, round, and reactive to light. Conjunctivae and EOM are normal.  Neck: Normal range of motion. Neck supple.  Cardiovascular: Normal rate, regular rhythm, normal heart sounds and intact distal pulses.   Pulmonary/Chest: Effort normal and breath sounds normal.  Abdominal: Soft. Bowel sounds are normal.  Musculoskeletal: Normal range of motion.  Neurological: He is alert and oriented to person, place, and time. He has normal reflexes.  Skin: Skin is warm and dry.  Psychiatric: He has a normal mood and affect. His behavior is normal.  Judgment and thought content normal.    BP 114/64 (BP Location: Left Arm, Patient Position: Sitting, Cuff Size: Normal)   Pulse 65   Temp 98.2 F (36.8 C) (Oral)   Resp 16   Ht 6\' 1"  (1.854 m)   Wt 157 lb (71.2 kg)   SpO2 100%   BMI 20.71 kg/m     Assessment & Plan:   Hb-SS disease without crisis (HCC) Sickle cell disease - Continue Hydrea at current dosage. Will check ANC, platelet count, and hemoglobin today. We discussed the need for good hydration, monitoring of hydration status, avoidance of heat, cold, stress, and infection triggers. We discussed the risks and benefits of Hydrea, including bone marrow suppression, the possibility of GI upset, skin ulcers, hair thinning, and teratogenicity. The patient was reminded of the need to seek medical attention of any symptoms of bleeding, anemia, or infection. Continue folic acid 1 mg daily to prevent aplastic bone marrow crises.   We discussed in detail that hydroxyurea has been found to be effective in reducing VOC, transfusions, and admissions for pain. It also reduces the duration of pain episodes by at least 50%. It depends on compliance. Compliance can be assessed by a rise in [facility] and drop in WBC/retic/ANC, and platelet count. It typically causes a rise in hemoglobin F, which can be assessed via hemoglobinopathy. It takes 3-6 months to see optimal effects of hydroxyurea therapy.   Pulmonary evaluation - Patient denies severe recurrent wheezes, shortness of breath with exercise, or persistent cough. If these symptoms develop, pulmonary function tests with spirometry will be ordered, and if abnormal, plan on referral to Pulmonology for further evaluation.  Cardiac - Routine screening for pulmonary hypertension is not recommended.  Eye - High risk of proliferative retinopathy. Annual eye exam with retinal exam recommended to patient. Last exam greater than 1 year ago.   Immunization status - Patient is up to date with immunizations   Acute  and chronic painful episodes - We agreed on his current medication dosage. We discussed that pt is to receive his Schedule II prescriptions only from [place]. Pt is also aware that the prescription history is available to [place] online through the [facility] CSRS. Controlled substance agreement signed previously.  We reminded [name]. [place] that all patients receiving Schedule II narcotics must be seen for follow within one month of prescription being requested. We reviewed the terms of our pain agreement, including the need to keep medicines in a safe locked location away from children or pets, and the need to report excess sedation or constipation, measures to avoid constipation, and policies related to early refills and stolen prescriptions. According to the [place] Chronic Pain Initiative program, we have reviewed details related to analgesia, adverse effects, aberrant behaviors. Reviewed [place] Substance Reporting system prior to prescribing opiate pain medications, no inconsistencies noted.  - Urinalysis Dipstick - 034742 9+OXYCODONE+CRT-UNBUND - Comprehensive  metabolic panel - CBC with Differential - Reticulocytes - oxyCODONE-acetaminophen (PERCOCET) 10-325 MG tablet; Take 1 tablet by mouth every 4 (four) hours as needed for pain.  Dispense: 90 tablet; Refill: 0  2. Chronic prescription opiate use - 872761 9+OXYCODONE+CRT-UNBUND - oxyCODONE-acetaminophen (PERCOCET) 10-325 MG tablet; Take 1 tablet by mouth every 4 (four) hours as needed for pain.  Dispense: 90 tablet; Refill: 0   Donia Pounds  MSN, FNP-C Patient [facility] [address] [place], [place] [address] [phone]

## 2018-01-22 NOTE — Patient Instructions (Signed)
Sickle Cell Anemia, Adult °Sickle cell anemia is a condition where your red blood cells are shaped like sickles. Red blood cells carry oxygen through the body. Sickle-shaped red blood cells do not live as long as normal red blood cells. They also clump together and block blood from flowing through the blood vessels. These things prevent the body from getting enough oxygen. Sickle cell anemia causes organ damage and pain. It also increases the risk of infection. °Follow these instructions at home: °· Drink enough fluid to keep your pee (urine) clear or pale yellow. Drink more in hot weather and during exercise. °· Do not smoke. Smoking lowers oxygen levels in the blood. °· Only take over-the-counter or prescription medicines as told by your doctor. °· Take antibiotic medicines as told by your doctor. Make sure you finish them even if you start to feel better. °· Take supplements as told by your doctor. °· Consider wearing a medical alert bracelet. This tells anyone caring for you in an emergency of your condition. °· When traveling, keep your medical information, doctors' names, and the medicines you take with you at all times. °· If you have a fever, do not take fever medicines right away. This could cover up a problem. Tell your doctor. °· Keep all follow-up visits with your doctor. Sickle cell anemia requires regular medical care. °Contact a doctor if: °You have a fever. °Get help right away if: °· You feel dizzy or faint. °· You have new belly (abdominal) pain, especially on the left side near the stomach area. °· You have a lasting, often uncomfortable and painful erection of the penis (priapism). If it is not treated right away, you will become unable to have sex (impotence). °· You have numbness in your arms or legs or you have a hard time moving them. °· You have a hard time talking. °· You have a fever or lasting symptoms for more than 2-3 days. °· You have a fever and your symptoms suddenly get  worse. °· You have signs or symptoms of infection. These include: °? Chills. °? Being more tired than normal (lethargy). °? Irritability. °? Poor eating. °? Throwing up (vomiting). °· You have pain that is not helped with medicine. °· You have shortness of breath. °· You have pain in your chest. °· You are coughing up pus-like or bloody mucus. °· You have a stiff neck. °· Your feet or hands swell or have pain. °· Your belly looks bloated. °· Your joints hurt. °This information is not intended to replace advice given to you by your health care provider. Make sure you discuss any questions you have with your health care provider. °Document Released: 05/22/2013 Document Revised: 01/07/2016 Document Reviewed: 03/13/2013 °Elsevier Interactive Patient Education © 2017 [employer] ° °

## 2018-01-23 LAB — CBC WITH DIFFERENTIAL/PLATELET
BASOS: 1 %
Basophils Absolute: 0 10*3/uL (ref 0.0–0.2)
EOS (ABSOLUTE): 0.3 10*3/uL (ref 0.0–0.4)
EOS: 5 %
HEMATOCRIT: 27.6 % — AB (ref 37.5–51.0)
Hemoglobin: 9.6 g/dL — ABNORMAL LOW (ref 13.0–17.7)
IMMATURE GRANS (ABS): 0 10*3/uL (ref 0.0–0.1)
Immature Granulocytes: 0 %
Lymphocytes Absolute: 3 10*3/uL (ref 0.7–3.1)
Lymphs: 39 %
MCH: 33.2 pg — AB (ref 26.6–33.0)
MCHC: 34.8 g/dL (ref 31.5–35.7)
MCV: 96 fL (ref 79–97)
MONOS ABS: 0.5 10*3/uL (ref 0.1–0.9)
Monocytes: 6 %
NEUTROS ABS: 3.7 10*3/uL (ref 1.4–7.0)
Neutrophils: 49 %
Platelets: 256 10*3/uL (ref 150–450)
RBC: 2.89 x10E6/uL — ABNORMAL LOW (ref 4.14–5.80)
RDW: 19.7 % — AB (ref 12.3–15.4)
WBC: 7.5 10*3/uL (ref 3.4–10.8)

## 2018-01-23 LAB — COMPREHENSIVE METABOLIC PANEL
A/G RATIO: 1.6 (ref 1.2–2.2)
ALBUMIN: 4.7 g/dL (ref 3.5–5.5)
ALT: 23 IU/L (ref 0–44)
AST: 33 IU/L (ref 0–40)
Alkaline Phosphatase: 65 IU/L (ref 39–117)
BUN / CREAT RATIO: 13 (ref 9–20)
BUN: 10 mg/dL (ref 6–20)
Bilirubin Total: 2.4 mg/dL — ABNORMAL HIGH (ref 0.0–1.2)
CALCIUM: 9.1 mg/dL (ref 8.7–10.2)
CO2: 23 mmol/L (ref 20–29)
CREATININE: 0.79 mg/dL (ref 0.76–1.27)
Chloride: 101 mmol/L (ref 96–106)
GFR calc non Af Amer: 116 mL/min/{1.73_m2} (ref >59)
GFR, EST AFRICAN AMERICAN: 134 mL/min/{1.73_m2} (ref >59)
GLOBULIN, TOTAL: 2.9 g/dL (ref 1.5–4.5)
Glucose: 89 mg/dL (ref 65–99)
POTASSIUM: 4.2 mmol/L (ref 3.5–5.2)
SODIUM: 138 mmol/L (ref 134–144)
TOTAL PROTEIN: 7.6 g/dL (ref 6.0–8.5)

## 2018-01-23 LAB — RETICULOCYTES: Retic Ct Pct: 6.2 % — ABNORMAL HIGH (ref 0.6–2.6)

## 2018-01-25 LAB — 737588 9+OXYCODONE+CRT-UNBUND
Amphetamine Scrn, Ur: NEGATIVE ng/mL
BARBITURATE SCREEN URINE: NEGATIVE ng/mL
BENZODIAZEPINE SCREEN, URINE: NEGATIVE ng/mL
CANNABINOIDS UR QL SCN: NEGATIVE ng/mL
COCAINE(METAB.)SCREEN, URINE: NEGATIVE ng/mL
Creatinine(Crt), U: 95.1 mg/dL (ref 20.0–300.0)
Methadone Screen, Urine: NEGATIVE ng/mL
Opiate Scrn, Ur: NEGATIVE ng/mL
PHENCYCLIDINE QUANTITATIVE URINE: NEGATIVE ng/mL
Ph of Urine: 6.8 (ref 4.5–8.9)
Propoxyphene Scrn, Ur: NEGATIVE ng/mL

## 2018-01-25 LAB — OXYCODONE/OXYMORPHONE CONFIRM
OXYCODONE/OXYMORPH: POSITIVE — AB
OXYCODONE: 402 ng/mL
OXYCODONE: POSITIVE — AB
OXYMORPHONE CONFIRM: 1005 ng/mL
OXYMORPHONE: POSITIVE — AB

## 2018-02-06 ENCOUNTER — Telehealth: Payer: Self-pay

## 2018-02-08 ENCOUNTER — Other Ambulatory Visit: Payer: Self-pay | Admitting: Family Medicine

## 2018-02-08 DIAGNOSIS — D571 Sickle-cell disease without crisis: Secondary | ICD-10-CM

## 2018-02-08 DIAGNOSIS — Z79891 Long term (current) use of opiate analgesic: Secondary | ICD-10-CM

## 2018-02-08 MED ORDER — OXYCODONE-ACETAMINOPHEN 10-325 MG PO TABS: 1.0000 | ORAL_TABLET | ORAL | 0 refills | Status: DC | PRN

## 2018-02-08 NOTE — Progress Notes (Signed)
Reviewed [place] Substance Reporting system prior to prescribing opiate medications. No inconsistencies noted.   Most recent UDS: 01/22/2018   Meds ordered this encounter  Medications  . oxyCODONE-acetaminophen (PERCOCET) 10-325 MG tablet    Sig: Take 1 tablet by mouth every 4 (four) hours as needed for pain.    Dispense:  90 tablet    Refill:  0    Order Specific Question:   Supervising Provider    Answer:   Tresa Garter [[id number]]     Donia Pounds  MSN, FNP-C Patient [facility] [address] [place], [place] [address] [phone]

## 2018-02-26 ENCOUNTER — Telehealth: Payer: Self-pay

## 2018-02-27 ENCOUNTER — Other Ambulatory Visit: Payer: Self-pay | Admitting: Internal Medicine

## 2018-02-27 DIAGNOSIS — Z79891 Long term (current) use of opiate analgesic: Secondary | ICD-10-CM

## 2018-02-27 DIAGNOSIS — D571 Sickle-cell disease without crisis: Secondary | ICD-10-CM

## 2018-02-27 MED ORDER — OXYCODONE-ACETAMINOPHEN 10-325 MG PO TABS: 1.0000 | ORAL_TABLET | ORAL | 0 refills | Status: DC | PRN

## 2018-02-27 NOTE — Telephone Encounter (Signed)
Refilled

## 2018-03-14 ENCOUNTER — Other Ambulatory Visit: Payer: Self-pay | Admitting: Internal Medicine

## 2018-03-19 ENCOUNTER — Telehealth: Payer: Self-pay

## 2018-03-19 ENCOUNTER — Other Ambulatory Visit: Payer: Self-pay | Admitting: Internal Medicine

## 2018-03-20 ENCOUNTER — Other Ambulatory Visit: Payer: Self-pay | Admitting: Internal Medicine

## 2018-03-20 DIAGNOSIS — Z79891 Long term (current) use of opiate analgesic: Secondary | ICD-10-CM

## 2018-03-20 DIAGNOSIS — D571 Sickle-cell disease without crisis: Secondary | ICD-10-CM

## 2018-03-20 MED ORDER — OXYCODONE-ACETAMINOPHEN 10-325 MG PO TABS: 1.0000 | ORAL_TABLET | ORAL | 0 refills | Status: DC | PRN

## 2018-03-20 NOTE — Telephone Encounter (Signed)
Refilled

## 2018-04-04 ENCOUNTER — Telehealth: Payer: Self-pay

## 2018-04-05 ENCOUNTER — Other Ambulatory Visit: Payer: Self-pay | Admitting: Internal Medicine

## 2018-04-05 DIAGNOSIS — Z79891 Long term (current) use of opiate analgesic: Secondary | ICD-10-CM

## 2018-04-05 DIAGNOSIS — D571 Sickle-cell disease without crisis: Secondary | ICD-10-CM

## 2018-04-05 MED ORDER — OXYCODONE-ACETAMINOPHEN 10-325 MG PO TABS: 1.0000 | ORAL_TABLET | ORAL | 0 refills | Status: DC | PRN

## 2018-04-05 NOTE — Telephone Encounter (Signed)
Refilled

## 2018-04-23 ENCOUNTER — Telehealth: Payer: Self-pay

## 2018-04-23 NOTE — Telephone Encounter (Signed)
He will need to be seen for this. New to this provider and has not been seen since June. He has an upcoming appt. See if he can come in earlier. Ok to double book.

## 2018-04-25 ENCOUNTER — Encounter: Payer: Self-pay | Admitting: Family Medicine

## 2018-04-25 ENCOUNTER — Ambulatory Visit (INDEPENDENT_AMBULATORY_CARE_PROVIDER_SITE_OTHER): Payer: Medicare Other | Admitting: Family Medicine

## 2018-04-25 VITALS — BP 117/68 | HR 72 | Temp 98.2°F | Resp 14 | Ht 73.0 in | Wt 157.0 lb

## 2018-04-25 DIAGNOSIS — E559 Vitamin D deficiency, unspecified: Secondary | ICD-10-CM

## 2018-04-25 DIAGNOSIS — D571 Sickle-cell disease without crisis: Secondary | ICD-10-CM | POA: Diagnosis not present

## 2018-04-25 MED ORDER — OXYCODONE-ACETAMINOPHEN 10-325 MG PO TABS: 1.0000 | ORAL_TABLET | ORAL | 0 refills | Status: DC | PRN

## 2018-04-25 NOTE — Progress Notes (Signed)
PATIENT CARE CENTER INTERNAL MEDICINE AND SICKLE CELL CARE  SICKLE CELL ANEMIA FOLLOW UP VISIT PROVIDER: Lanae Boast, FNP    Subjective:   [name]  is a 37 y.o.  male who  has a past medical history of Sickle cell anemia ([place]). presents for a follow up for Sickle Cell Anemia. his last hospitalization was 04/19/2017. he has had less than 6  hospitalizations in the past 12 months.  Pain regimen includes: Percocet 10/325. Takes 1/2 prn Hydrea Therapy: Yes Medication compliance: Yes  Pain today is 3/10 and is located in the lower back.  Patient reports adequate daily hydration.  Last eye exam over a year ago.   Review of Systems  Constitutional: Negative.   HENT: Negative.   Eyes: Negative.   Respiratory: Negative.   Cardiovascular: Negative.   Gastrointestinal: Negative.   Genitourinary: Negative.   Musculoskeletal: Positive for back pain.  Skin: Negative.   Neurological: Negative.   Psychiatric/Behavioral: Negative.     Objective:   Objective  BP 117/68 (BP Location: Left Arm, Patient Position: Sitting, Cuff Size: Normal)   Pulse 72   Temp 98.2 F (36.8 C) (Oral)   Resp 14   Ht 6\' 1"  (1.854 m)   Wt 157 lb (71.2 kg)   SpO2 99%   BMI 20.71 kg/m    Physical Exam  Constitutional: He is oriented to person, place, and time. He appears well-developed and well-nourished. No distress.  HENT:  Head: Normocephalic and atraumatic.  Eyes: Pupils are equal, round, and reactive to light. Conjunctivae and EOM are normal.  Neck: Normal range of motion.  Cardiovascular: Normal rate, regular rhythm, normal heart sounds and intact distal pulses.  Pulmonary/Chest: Effort normal and breath sounds normal. No respiratory distress.  Abdominal: Soft. Bowel sounds are normal. He exhibits no distension.  Musculoskeletal: Normal range of motion. He exhibits tenderness (back pain).  Neurological: He is alert and oriented to person, place, and time.  Skin: Skin is warm and dry.    Psychiatric: He has a normal mood and affect. His behavior is normal. Thought content normal.  Nursing note and vitals reviewed.    Assessment/Plan:   Assessment   Encounter Diagnoses  Name Primary?  Marland Kitchen Hb-SS disease without crisis ([place]) Yes  . Vitamin D deficiency      Plan  1. Hb-SS disease without crisis ([place]) - Ambulatory referral to Ophthalmology - CBC With Differential - Comprehensive metabolic panel - Hemoglobinopathy Evaluation - Reticulocytes - VITAMIN D 25 Hydroxy (Vit-D Deficiency, Fractures) - Ferritin - [id number] 9+OXYCODONE+CRT-UNBUND  2. Vitamin D deficiency - VITAMIN D 25 Hydroxy (Vit-D Deficiency, Fractures)   Return to care as scheduled and prn. Patient verbalized understanding and agreed with plan of care.   1. Sickle cell disease -   We discussed the need for good hydration, monitoring of hydration status, avoidance of heat, cold, stress, and infection triggers. We discussed the risks and benefits of Hydrea, including bone marrow suppression, the possibility of GI upset, skin ulcers, hair thinning, and teratogenicity. The patient was reminded of the need to seek medical attention of any symptoms of bleeding, anemia, or infection. Continue folic acid 1 mg daily to prevent aplastic bone marrow crises.   2. Pulmonary evaluation - Patient denies severe recurrent wheezes, shortness of breath with exercise, or persistent cough. If these symptoms develop, pulmonary function tests with spirometry will be ordered, and if abnormal, plan on referral to Pulmonology for further evaluation.  3. Cardiac - Routine screening for pulmonary hypertension is  not recommended.  4. Eye - High risk of proliferative retinopathy. Annual eye exam with retinal exam recommended to patient.  5. Immunization status -  Yearly influenza vaccination is recommended, as well as being up to date with Meningococcal and Pneumococcal vaccines.   6. Acute and chronic painful episodes - We discussed  that pt is to receive Schedule II prescriptions only from [place]. Pt is also aware that the prescription history is available to [place] online through the [facility] CSRS. Controlled substance agreement signed . We reminded [name] that all patients receiving Schedule II narcotics must be seen for follow within one month of prescription being requested. We reviewed the terms of our pain agreement, including the need to keep medicines in a safe locked location away from children or pets, and the need to report excess sedation or constipation, measures to avoid constipation, and policies related to early refills and stolen prescriptions. According to the [place] Chronic Pain Initiative program, we have reviewed details related to analgesia, adverse effects, aberrant behaviors.  7. Iron overload from chronic transfusion.  Not applicable at this time.  If this occurs will use Exjade for management.   8. Vitamin D deficiency - Drisdol 50,000 units weekly. Patient encouraged to take as prescribed.   The above recommendations are taken from the NIH Evidence-Based Management of Sickle Cell Disease: Expert Panel Report, 20149.   Ms. Andr L. Nathaneil Canary, FNP-BC Patient [facility] [address] [place], [place] [address] [phone]  This note has been created with Dragon speech recognition software and smart phrase technology. Any transcriptional errors are unintentional.

## 2018-04-25 NOTE — Patient Instructions (Signed)
Sickle Cell Anemia, Adult °Sickle cell anemia is a condition where your red blood cells are shaped like sickles. Red blood cells carry oxygen through the body. Sickle-shaped red blood cells do not live as long as normal red blood cells. They also clump together and block blood from flowing through the blood vessels. These things prevent the body from getting enough oxygen. Sickle cell anemia causes organ damage and pain. It also increases the risk of infection. °Follow these instructions at home: °· Drink enough fluid to keep your pee (urine) clear or pale yellow. Drink more in hot weather and during exercise. °· Do not smoke. Smoking lowers oxygen levels in the blood. °· Only take over-the-counter or prescription medicines as told by your doctor. °· Take antibiotic medicines as told by your doctor. Make sure you finish them even if you start to feel better. °· Take supplements as told by your doctor. °· Consider wearing a medical alert bracelet. This tells anyone caring for you in an emergency of your condition. °· When traveling, keep your medical information, doctors' names, and the medicines you take with you at all times. °· If you have a fever, do not take fever medicines right away. This could cover up a problem. Tell your doctor. °· Keep all follow-up visits with your doctor. Sickle cell anemia requires regular medical care. °Contact a doctor if: °You have a fever. °Get help right away if: °· You feel dizzy or faint. °· You have new belly (abdominal) pain, especially on the left side near the stomach area. °· You have a lasting, often uncomfortable and painful erection of the penis (priapism). If it is not treated right away, you will become unable to have sex (impotence). °· You have numbness in your arms or legs or you have a hard time moving them. °· You have a hard time talking. °· You have a fever or lasting symptoms for more than 2-3 days. °· You have a fever and your symptoms suddenly get  worse. °· You have signs or symptoms of infection. These include: °? Chills. °? Being more tired than normal (lethargy). °? Irritability. °? Poor eating. °? Throwing up (vomiting). °· You have pain that is not helped with medicine. °· You have shortness of breath. °· You have pain in your chest. °· You are coughing up pus-like or bloody mucus. °· You have a stiff neck. °· Your feet or hands swell or have pain. °· Your belly looks bloated. °· Your joints hurt. °This information is not intended to replace advice given to you by your health care provider. Make sure you discuss any questions you have with your health care provider. °Document Released: 05/22/2013 Document Revised: 01/07/2016 Document Reviewed: 03/13/2013 °Elsevier Interactive Patient Education © 2017 [employer] ° °

## 2018-04-30 LAB — OXYCODONE/OXYMORPHONE CONFIRM
OXYCODONE/OXYMORPH: POSITIVE — AB
OXYCODONE: NEGATIVE
OXYMORPHONE CONFIRM: 147 ng/mL
OXYMORPHONE: POSITIVE — AB

## 2018-04-30 LAB — 737588 9+OXYCODONE+CRT-UNBUND
Amphetamine Scrn, Ur: NEGATIVE ng/mL
BARBITURATE SCREEN URINE: NEGATIVE ng/mL
BENZODIAZEPINE SCREEN, URINE: NEGATIVE ng/mL
CANNABINOIDS UR QL SCN: NEGATIVE ng/mL
Cocaine (Metab) Scrn, Ur: NEGATIVE ng/mL
Creatinine(Crt), U: 131.5 mg/dL (ref 20.0–300.0)
Methadone Screen, Urine: NEGATIVE ng/mL
Opiate Scrn, Ur: NEGATIVE ng/mL
Ph of Urine: 6.6 (ref 4.5–8.9)
Phencyclidine Qn, Ur: NEGATIVE ng/mL
Propoxyphene Scrn, Ur: NEGATIVE ng/mL

## 2018-05-01 LAB — COMPREHENSIVE METABOLIC PANEL
ALT: 56 IU/L — ABNORMAL HIGH (ref 0–44)
AST: 59 IU/L — ABNORMAL HIGH (ref 0–40)
Albumin/Globulin Ratio: 1.4 (ref 1.2–2.2)
Albumin: 4.9 g/dL (ref 3.5–5.5)
Alkaline Phosphatase: 67 IU/L (ref 39–117)
BUN/Creatinine Ratio: 9 (ref 9–20)
BUN: 8 mg/dL (ref 6–20)
Bilirubin Total: 2.3 mg/dL — ABNORMAL HIGH (ref 0.0–1.2)
CO2: 24 mmol/L (ref 20–29)
Calcium: 9.6 mg/dL (ref 8.7–10.2)
Chloride: 101 mmol/L (ref 96–106)
Creatinine, Ser: 0.86 mg/dL (ref 0.76–1.27)
GFR calc Af Amer: 130 mL/min/{1.73_m2} (ref >59)
GFR calc non Af Amer: 112 mL/min/{1.73_m2} (ref >59)
Globulin, Total: 3.6 g/dL (ref 1.5–4.5)
Glucose: 85 mg/dL (ref 65–99)
Potassium: 4.3 mmol/L (ref 3.5–5.2)
Sodium: 139 mmol/L (ref 134–144)
Total Protein: 8.5 g/dL (ref 6.0–8.5)

## 2018-05-01 LAB — HEMOGLOBINOPATHY EVALUATION
Ferritin: 217 ng/mL (ref 30–400)
Hematocrit: 30.8 % — ABNORMAL LOW (ref 37.5–51.0)
Hemoglobin: 10.7 g/dL — ABNORMAL LOW (ref 13.0–17.7)
Hgb A2 Quant: 2.8 % (ref 1.8–3.2)
Hgb A: 0 % — ABNORMAL LOW (ref 96.4–98.8)
Hgb C: 0 %
Hgb F Quant: 28.2 % — ABNORMAL HIGH (ref 0.0–2.0)
Hgb S: 69 % — ABNORMAL HIGH
Hgb Solubility: POSITIVE — AB
Hgb Variant: 0 %
MCH: 34.3 pg — ABNORMAL HIGH (ref 26.6–33.0)
MCHC: 34.7 g/dL (ref 31.5–35.7)
MCV: 99 fL — ABNORMAL HIGH (ref 79–97)
Platelets: 233 10*3/uL (ref 150–450)
RBC: 3.12 x10E6/uL — ABNORMAL LOW (ref 4.14–5.80)
RDW: 17.3 % — ABNORMAL HIGH (ref 12.3–15.4)
WBC: 9.2 10*3/uL (ref 3.4–10.8)

## 2018-05-01 LAB — VITAMIN D 25 HYDROXY (VIT D DEFICIENCY, FRACTURES): Vit D, 25-Hydroxy: 31.6 ng/mL (ref 30.0–100.0)

## 2018-05-01 LAB — RETICULOCYTES: Retic Ct Pct: 6.1 % — ABNORMAL HIGH (ref 0.6–2.6)

## 2018-05-01 LAB — CBC WITH DIFFERENTIAL
Basophils Absolute: 0 10*3/uL (ref 0.0–0.2)
Basos: 0 %
EOS (ABSOLUTE): 0.1 10*3/uL (ref 0.0–0.4)
Eos: 1 %
Immature Grans (Abs): 0 10*3/uL (ref 0.0–0.1)
Immature Granulocytes: 0 %
Lymphocytes Absolute: 1.8 10*3/uL (ref 0.7–3.1)
Lymphs: 19 %
Monocytes Absolute: 0.5 10*3/uL (ref 0.1–0.9)
Monocytes: 5 %
Neutrophils Absolute: 6.9 10*3/uL (ref 1.4–7.0)
Neutrophils: 75 %

## 2018-05-07 ENCOUNTER — Telehealth: Payer: Self-pay

## 2018-05-09 MED ORDER — OXYCODONE-ACETAMINOPHEN 10-325 MG PO TABS: 1.0000 | ORAL_TABLET | ORAL | 0 refills | Status: DC | PRN

## 2018-05-09 NOTE — Telephone Encounter (Signed)
Refilled

## 2018-05-25 ENCOUNTER — Telehealth: Payer: Self-pay

## 2018-05-28 MED ORDER — OXYCODONE-ACETAMINOPHEN 10-325 MG PO TABS: 1.0000 | ORAL_TABLET | ORAL | 0 refills | Status: DC | PRN

## 2018-05-28 NOTE — Telephone Encounter (Signed)
Refilled. Reviewed [place] Substance Reporting system prior to prescribing opiate medications. No inconsistencies noted.

## 2018-06-11 ENCOUNTER — Telehealth: Payer: Self-pay

## 2018-06-11 MED ORDER — OXYCODONE-ACETAMINOPHEN 10-325 MG PO TABS: 1.0000 | ORAL_TABLET | ORAL | 0 refills | Status: DC | PRN

## 2018-06-11 NOTE — Telephone Encounter (Signed)
Refilled

## 2018-07-03 ENCOUNTER — Telehealth: Payer: Self-pay

## 2018-07-04 MED ORDER — OXYCODONE-ACETAMINOPHEN 10-325 MG PO TABS: 1.0000 | ORAL_TABLET | ORAL | 0 refills | Status: DC | PRN

## 2018-07-04 NOTE — Telephone Encounter (Signed)
refilled 

## 2018-07-17 ENCOUNTER — Telehealth: Payer: Self-pay

## 2018-07-19 MED ORDER — OXYCODONE-ACETAMINOPHEN 10-325 MG PO TABS: 1.0000 | ORAL_TABLET | ORAL | 0 refills | Status: DC | PRN

## 2018-07-19 NOTE — Telephone Encounter (Signed)
Refilled

## 2018-07-25 ENCOUNTER — Ambulatory Visit: Payer: Self-pay | Admitting: Family Medicine

## 2018-08-13 ENCOUNTER — Telehealth: Payer: Self-pay

## 2018-08-13 ENCOUNTER — Other Ambulatory Visit: Payer: Self-pay | Admitting: Family Medicine

## 2018-08-13 DIAGNOSIS — D571 Sickle-cell disease without crisis: Secondary | ICD-10-CM

## 2018-08-13 DIAGNOSIS — Z79891 Long term (current) use of opiate analgesic: Secondary | ICD-10-CM

## 2018-08-13 DIAGNOSIS — G894 Chronic pain syndrome: Secondary | ICD-10-CM

## 2018-08-13 MED ORDER — OXYCODONE-ACETAMINOPHEN 10-325 MG PO TABS: 1.0000 | ORAL_TABLET | ORAL | 0 refills | Status: DC | PRN

## 2018-08-14 NOTE — Telephone Encounter (Signed)
Patient notified

## 2018-08-28 ENCOUNTER — Telehealth: Payer: Self-pay

## 2018-08-28 NOTE — Telephone Encounter (Signed)
ANDR 

## 2018-08-30 NOTE — Telephone Encounter (Signed)
Patient needs an appointment

## 2018-08-30 NOTE — Telephone Encounter (Signed)
Called and spoke with patient, advised that he will have to have an appointment and he was transferred to front to make an appointment. Thanks!

## 2018-08-31 ENCOUNTER — Ambulatory Visit (INDEPENDENT_AMBULATORY_CARE_PROVIDER_SITE_OTHER): Payer: Medicare Other | Admitting: Family Medicine

## 2018-08-31 ENCOUNTER — Encounter: Payer: Self-pay | Admitting: Family Medicine

## 2018-08-31 VITALS — BP 123/66 | HR 85 | Temp 97.8°F | Resp 16 | Ht 73.0 in | Wt 158.0 lb

## 2018-08-31 DIAGNOSIS — D571 Sickle-cell disease without crisis: Secondary | ICD-10-CM | POA: Diagnosis not present

## 2018-08-31 DIAGNOSIS — Z79891 Long term (current) use of opiate analgesic: Secondary | ICD-10-CM | POA: Diagnosis not present

## 2018-08-31 DIAGNOSIS — G894 Chronic pain syndrome: Secondary | ICD-10-CM

## 2018-08-31 DIAGNOSIS — E559 Vitamin D deficiency, unspecified: Secondary | ICD-10-CM | POA: Diagnosis not present

## 2018-08-31 LAB — POCT URINALYSIS DIPSTICK
Bilirubin, UA: NEGATIVE
Blood, UA: NEGATIVE
Glucose, UA: NEGATIVE
Ketones, UA: NEGATIVE
Leukocytes, UA: NEGATIVE
Nitrite, UA: NEGATIVE
Protein, UA: NEGATIVE
Spec Grav, UA: 1.01 (ref 1.010–1.025)
Urobilinogen, UA: 1 E.U./dL
pH, UA: 6 (ref 5.0–8.0)

## 2018-08-31 MED ORDER — OXYCODONE-ACETAMINOPHEN 10-325 MG PO TABS: 1.0000 | ORAL_TABLET | ORAL | 0 refills | Status: DC | PRN

## 2018-08-31 NOTE — Progress Notes (Signed)
PATIENT CARE CENTER INTERNAL MEDICINE AND SICKLE CELL CARE  SICKLE CELL ANEMIA FOLLOW UP VISIT PROVIDER: Lanae Boast, FNP    Subjective:   [name]  is a 38 y.o.  male who  has a past medical history of Sickle cell anemia ([place]). presents for a follow up for Sickle Cell Anemia. The patient has had 0 admissions in the past 6 months. Patient reports no admissions in the past 1.5 years.  Pain regimen includes: Ibuprofen and percocet 10/325.  Hydrea Therapy: Yes Medication compliance: Yes  Denies pain. The patient reports adequate daily hydration.      ROS  Objective:   Objective  BP 123/66 (BP Location: Left Arm, Patient Position: Sitting, Cuff Size: Normal)   Pulse 85   Temp 97.8 F (36.6 C) (Oral)   Resp 16   Ht 6\' 1"  (1.854 m)   Wt 158 lb (71.7 kg)   SpO2 99%   BMI 20.85 kg/m   Wt Readings from Last 3 Encounters:  08/31/18 158 lb (71.7 kg)  04/25/18 157 lb (71.2 kg)  01/22/18 157 lb (71.2 kg)     Physical Exam Vitals signs and nursing note reviewed.  Constitutional:      General: He is not in acute distress.    Appearance: He is well-developed.  HENT:     Head: Normocephalic and atraumatic.  Eyes:     Conjunctiva/sclera: Conjunctivae normal.     Pupils: Pupils are equal, round, and reactive to light.  Neck:     Musculoskeletal: Normal range of motion.  Cardiovascular:     Rate and Rhythm: Normal rate. Rhythm irregular.     Heart sounds: Normal heart sounds. No murmur.     Comments: Regularly irregular.  Pulmonary:     Effort: Pulmonary effort is normal. No respiratory distress.     Breath sounds: Normal breath sounds.  Abdominal:     General: Bowel sounds are normal. There is no distension.     Palpations: Abdomen is soft.  Musculoskeletal: Normal range of motion.  Skin:    General: Skin is warm and dry.  Neurological:     Mental Status: He is alert and oriented to person, place, and time.  Psychiatric:        Behavior: Behavior normal.          Thought Content: Thought content normal.      Assessment/Plan:   Assessment   Encounter Diagnosis  Name Primary?  Marland Kitchen Hb-SS disease without crisis ([place]) Yes     Plan  1. Hb-SS disease without crisis [facility]) Pending labs. Will adjust medications accordingly.    - Urinalysis Dipstick - CBC with Differential - Comprehensive metabolic panel - Vitamin D, 25-hydroxy - [id number] 11+Oxyco+Alc+Crt-Bund - oxyCODONE-acetaminophen (PERCOCET) 10-325 MG tablet; Take 1 tablet by mouth every 4 (four) hours as needed for up to 15 days for pain.  Dispense: 90 tablet; Refill: 0  2. Vitamin D deficiency Pending labs. Will adjust medications accordingly.    - Vitamin D, 25-hydroxy  3. Chronic prescription opiate use Pending labs. Will adjust medications accordingly.    - oxyCODONE-acetaminophen (PERCOCET) 10-325 MG tablet; Take 1 tablet by mouth every 4 (four) hours as needed for up to 15 days for pain.  Dispense: 90 tablet; Refill: 0  4. Chronic pain syndrome Pending labs. Will adjust medications accordingly.    - oxyCODONE-acetaminophen (PERCOCET) 10-325 MG tablet; Take 1 tablet by mouth every 4 (four) hours as needed for up to 15 days for pain.  Dispense: 90  tablet; Refill: 0   Return to care as scheduled and prn. Patient verbalized understanding and agreed with plan of care.   1. Sickle cell disease - Continue Hydrea   We discussed the need for good hydration, monitoring of hydration status, avoidance of heat, cold, stress, and infection triggers. We discussed the risks and benefits of Hydrea, including bone marrow suppression, the possibility of GI upset, skin ulcers, hair thinning, and teratogenicity. The patient was reminded of the need to seek medical attention of any symptoms of bleeding, anemia, or infection. Continue folic acid 1 mg daily to prevent aplastic bone marrow crises.   2. Pulmonary evaluation - Patient denies severe recurrent wheezes, shortness of breath with exercise, or  persistent cough. If these symptoms develop, pulmonary function tests with spirometry will be ordered, and if abnormal, plan on referral to Pulmonology for further evaluation.  3. Cardiac - Routine screening for pulmonary hypertension is not recommended.  4. Eye - High risk of proliferative retinopathy. Annual eye exam with retinal exam recommended to patient.  5. Immunization status -  Yearly influenza vaccination is recommended, as well as being up to date with Meningococcal and Pneumococcal vaccines.   6. Acute and chronic painful episodes - We discussed that pt is to receive Schedule II prescriptions only from [place]. Pt is also aware that the prescription history is available to [place] online through the [facility] CSRS. Controlled substance agreement signed. We reminded [name] that all patients receiving Schedule II narcotics must be seen for follow within one month of prescription being requested. We reviewed the terms of our pain agreement, including the need to keep medicines in a safe locked location away from children or pets, and the need to report excess sedation or constipation, measures to avoid constipation, and policies related to early refills and stolen prescriptions. According to the [place] Chronic Pain Initiative program, we have reviewed details related to analgesia, adverse effects, aberrant behaviors.  7. Iron overload from chronic transfusion.  Not applicable at this time.  If this occurs will use Exjade for management.   8. Vitamin D deficiency - Drisdol 50,000 units weekly. Patient encouraged to take as prescribed.   The above recommendations are taken from the NIH Evidence-Based Management of Sickle Cell Disease: Expert Panel Report, 20149.   Ms. Andr L. Nathaneil Canary, FNP-BC Patient [facility] [address] [place], [place] [address] [phone]  This note has been created with Dragon speech recognition software and smart phrase technology. Any  transcriptional errors are unintentional.

## 2018-08-31 NOTE — Patient Instructions (Signed)
Sickle Cell Anemia, Adult °Sickle cell anemia is a condition where your red blood cells are shaped like sickles. Red blood cells carry oxygen through the body. Sickle-shaped cells do not live as long as normal red blood cells. They also clump together and block blood from flowing through the blood vessels. This prevents the body from getting enough oxygen. Sickle cell anemia causes organ damage and pain. It also increases the risk of infection. °Follow these instructions at home: °Medicines °· Take over-the-counter and prescription medicines only as told by your doctor. °· If you were prescribed an antibiotic medicine, take it as told by your doctor. Do not stop taking the antibiotic even if you start to feel better. °· If you develop a fever, do not take medicines to lower the fever right away. Tell your doctor about the fever. °Managing pain, stiffness, and swelling °· Try these methods to help with pain: °? Use a heating pad. °? Take a warm bath. °? Distract yourself, such as by watching TV. °Eating and drinking °· Drink enough fluid to keep your pee (urine) clear or pale yellow. Drink more in hot weather and during exercise. °· Limit or avoid alcohol. °· Eat a healthy diet. Eat plenty of fruits, vegetables, whole grains, and lean protein. °· Take vitamins and supplements as told by your doctor. °Traveling °· When traveling, keep these with you: °? Your medical information. °? The names of your doctors. °? Your medicines. °· If you need to take an airplane, talk to your doctor first. °Activity °· Rest often. °· Avoid exercises that make your heart beat much faster, such as jogging. °General instructions °· Do not use products that have nicotine or tobacco, such as cigarettes and e-cigarettes. If you need help quitting, ask your doctor. °· Consider wearing a medical alert bracelet. °· Avoid being in high places (high altitudes), such as mountains. °· Avoid very hot or cold temperatures. °· Avoid places where the  temperature changes a lot. °· Keep all follow-up visits as told by your doctor. This is important. °Contact a doctor if: °· A joint hurts. °· Your feet or hands hurt or swell. °· You feel tired (fatigued). °Get help right away if: °· You have symptoms of infection. These include: °? Fever. °? Chills. °? Being very tired. °? Irritability. °? Poor eating. °? Throwing up (vomiting). °· You feel dizzy or faint. °· You have new stomach pain, especially on the left side. °· You have a an erection (priapism) that lasts more than 4 hours. °· You have numbness in your arms or legs. °· You have a hard time moving your arms or legs. °· You have trouble talking. °· You have pain that does not go away when you take medicine. °· You are short of breath. °· You are breathing fast. °· You have a long-term cough. °· You have pain in your chest. °· You have a bad headache. °· You have a stiff neck. °· Your stomach looks bloated even though you did not eat much. °· Your skin is pale. °· You suddenly cannot see well. °Summary °· Sickle cell anemia is a condition where your red blood cells are shaped like sickles. °· Follow your doctor's advice on ways to manage pain, food to eat, activities to do, and steps to take for safe travel. °· Get medical help right away if you have any signs of infection, such as a fever. °This information is not intended to replace advice given to you by your   health care provider. Make sure you discuss any questions you have with your health care provider. °Document Released: 05/22/2013 Document Revised: 09/06/2016 Document Reviewed: 09/06/2016 °Elsevier Interactive Patient Education © 2019 [employer] Inc. ° °

## 2018-09-01 LAB — COMPREHENSIVE METABOLIC PANEL
ALT: 26 IU/L (ref 0–44)
AST: 26 IU/L (ref 0–40)
Albumin/Globulin Ratio: 1.3 (ref 1.2–2.2)
Albumin: 4.5 g/dL (ref 3.5–5.5)
Alkaline Phosphatase: 60 IU/L (ref 39–117)
BUN/Creatinine Ratio: 6 — ABNORMAL LOW (ref 9–20)
BUN: 5 mg/dL — ABNORMAL LOW (ref 6–20)
Bilirubin Total: 1.9 mg/dL — ABNORMAL HIGH (ref 0.0–1.2)
CO2: 23 mmol/L (ref 20–29)
Calcium: 9.5 mg/dL (ref 8.7–10.2)
Chloride: 102 mmol/L (ref 96–106)
Creatinine, Ser: 0.81 mg/dL (ref 0.76–1.27)
GFR calc Af Amer: 132 mL/min/{1.73_m2} (ref >59)
GFR calc non Af Amer: 114 mL/min/{1.73_m2} (ref >59)
Globulin, Total: 3.5 g/dL (ref 1.5–4.5)
Glucose: 90 mg/dL (ref 65–99)
Potassium: 4.2 mmol/L (ref 3.5–5.2)
Sodium: 139 mmol/L (ref 134–144)
Total Protein: 8 g/dL (ref 6.0–8.5)

## 2018-09-01 LAB — CBC WITH DIFFERENTIAL/PLATELET
Basophils Absolute: 0.1 10*3/uL (ref 0.0–0.2)
Basos: 1 %
EOS (ABSOLUTE): 0.3 10*3/uL (ref 0.0–0.4)
Eos: 4 %
Hematocrit: 30.5 % — ABNORMAL LOW (ref 37.5–51.0)
Hemoglobin: 10.9 g/dL — ABNORMAL LOW (ref 13.0–17.7)
Immature Grans (Abs): 0 10*3/uL (ref 0.0–0.1)
Immature Granulocytes: 0 %
Lymphocytes Absolute: 2.1 10*3/uL (ref 0.7–3.1)
Lymphs: 35 %
MCH: 35.4 pg — ABNORMAL HIGH (ref 26.6–33.0)
MCHC: 35.7 g/dL (ref 31.5–35.7)
MCV: 99 fL — ABNORMAL HIGH (ref 79–97)
Monocytes Absolute: 0.4 10*3/uL (ref 0.1–0.9)
Monocytes: 7 %
NRBC: 5 % — ABNORMAL HIGH (ref 0–0)
Neutrophils Absolute: 3.3 10*3/uL (ref 1.4–7.0)
Neutrophils: 53 %
Platelets: 244 10*3/uL (ref 150–450)
RBC: 3.08 x10E6/uL — ABNORMAL LOW (ref 4.14–5.80)
RDW: 16.3 % — ABNORMAL HIGH (ref 11.6–15.4)
WBC: 6.2 10*3/uL (ref 3.4–10.8)

## 2018-09-01 LAB — VITAMIN D 25 HYDROXY (VIT D DEFICIENCY, FRACTURES): Vit D, 25-Hydroxy: 25.3 ng/mL — ABNORMAL LOW (ref 30.0–100.0)

## 2018-09-04 LAB — DRUG SCREEN 764883 11+OXYCO+ALC+CRT-BUND
Amphetamines, Urine: NEGATIVE ng/mL
BENZODIAZ UR QL: NEGATIVE ng/mL
Barbiturate: NEGATIVE ng/mL
Cannabinoid Quant, Ur: NEGATIVE ng/mL
Cocaine (Metabolite): NEGATIVE ng/mL
Creatinine: 114.8 mg/dL (ref 20.0–300.0)
Ethanol: NEGATIVE %
Meperidine: NEGATIVE ng/mL
Methadone Screen, Urine: NEGATIVE ng/mL
OPIATE SCREEN URINE: NEGATIVE ng/mL
Phencyclidine: NEGATIVE ng/mL
Propoxyphene: NEGATIVE ng/mL
Tramadol: NEGATIVE ng/mL
pH, Urine: 5.9 (ref 4.5–8.9)

## 2018-09-04 LAB — OXYCODONE/OXYMORPHONE, CONFIRM
OXYCODONE/OXYMORPH: POSITIVE — AB
OXYCODONE: 584 ng/mL
OXYCODONE: POSITIVE — AB
OXYMORPHONE (GC/MS): 1600 ng/mL
OXYMORPHONE: POSITIVE — AB

## 2018-09-06 ENCOUNTER — Telehealth: Payer: Self-pay

## 2018-09-06 MED ORDER — VITAMIN D (ERGOCALCIFEROL) 1.25 MG (50000 UNIT) PO CAPS: 50000.0000 [IU] | ORAL_CAPSULE | ORAL | 1 refills | Status: DC

## 2018-09-06 NOTE — Telephone Encounter (Signed)
-----   Message from Lanae Boast, [place] sent at 09/06/2018  8:56 AM EST ----- Low Vitamin D. All other labs are stable. I will send Vit D to the pharmacy. Please make sure you are compliant with this medication.

## 2018-09-06 NOTE — Telephone Encounter (Signed)
Called and spoke with patient, advised that all labs were stable except for vitamin D. Advised that we have sent this into pharmacy and asked that he take this every week as directed. Patient verbalized understanding. Thanks!

## 2018-09-13 ENCOUNTER — Other Ambulatory Visit: Payer: Self-pay | Admitting: Family Medicine

## 2018-09-13 ENCOUNTER — Ambulatory Visit (HOSPITAL_COMMUNITY)
Admission: RE | Admit: 2018-09-13 | Discharge: 2018-09-13 | Disposition: A | Discharge status: Home / Self Care | Payer: Medicare Other | Source: Ambulatory Visit | Attending: Family Medicine | Admitting: Family Medicine

## 2018-09-13 ENCOUNTER — Telehealth (HOSPITAL_COMMUNITY): Payer: Self-pay | Admitting: *Deleted

## 2018-09-13 DIAGNOSIS — D571 Sickle-cell disease without crisis: Secondary | ICD-10-CM | POA: Diagnosis not present

## 2018-09-13 DIAGNOSIS — R42 Dizziness and giddiness: Secondary | ICD-10-CM | POA: Diagnosis not present

## 2018-09-13 DIAGNOSIS — R05 Cough: Secondary | ICD-10-CM | POA: Diagnosis not present

## 2018-09-13 DIAGNOSIS — R059 Cough, unspecified: Secondary | ICD-10-CM

## 2018-09-13 LAB — COMPREHENSIVE METABOLIC PANEL
ALT: 29 U/L (ref 0–44)
AST: 35 U/L (ref 15–41)
Albumin: 4.7 g/dL (ref 3.5–5.0)
Alkaline Phosphatase: 50 U/L (ref 38–126)
Anion gap: 8 (ref 5–15)
BILIRUBIN TOTAL: 2.1 mg/dL — AB (ref 0.3–1.2)
BUN: 10 mg/dL (ref 6–20)
CO2: 25 mmol/L (ref 22–32)
Calcium: 9 mg/dL (ref 8.9–10.3)
Chloride: 100 mmol/L (ref 98–111)
Creatinine, Ser: 0.89 mg/dL (ref 0.61–1.24)
GFR calc Af Amer: >60 mL/min (ref >60)
GFR calc non Af Amer: >60 mL/min (ref >60)
GLUCOSE: 107 mg/dL — AB (ref 70–99)
POTASSIUM: 4.2 mmol/L (ref 3.5–5.1)
Sodium: 133 mmol/L — ABNORMAL LOW (ref 135–145)
Total Protein: 8.8 g/dL — ABNORMAL HIGH (ref 6.5–8.1)

## 2018-09-13 LAB — CBC WITH DIFFERENTIAL/PLATELET
Abs Immature Granulocytes: 0.03 10*3/uL (ref 0.00–0.07)
BASOS ABS: 0 10*3/uL (ref 0.0–0.1)
Basophils Relative: 1 %
Eosinophils Absolute: 0.2 10*3/uL (ref 0.0–0.5)
Eosinophils Relative: 3 %
HCT: 29.5 % — ABNORMAL LOW (ref 39.0–52.0)
Hemoglobin: 10.5 g/dL — ABNORMAL LOW (ref 13.0–17.0)
Immature Granulocytes: 1 %
Lymphocytes Relative: 37 %
Lymphs Abs: 1.8 10*3/uL (ref 0.7–4.0)
MCH: 34 pg (ref 26.0–34.0)
MCHC: 35.6 g/dL (ref 30.0–36.0)
MCV: 95.5 fL (ref 80.0–100.0)
Monocytes Absolute: 0.3 10*3/uL (ref 0.1–1.0)
Monocytes Relative: 7 %
NEUTROS PCT: 51 %
Neutro Abs: 2.5 10*3/uL (ref 1.7–7.7)
PLATELETS: 774 10*3/uL — AB (ref 150–400)
RBC: 3.09 MIL/uL — ABNORMAL LOW (ref 4.22–5.81)
RDW: 15.5 % (ref 11.5–15.5)
WBC: 4.8 10*3/uL (ref 4.0–10.5)
nRBC: 6.7 % — ABNORMAL HIGH (ref 0.0–0.2)

## 2018-09-13 LAB — URINALYSIS, COMPLETE (UACMP) WITH MICROSCOPIC
Bacteria, UA: NONE SEEN
Bilirubin Urine: NEGATIVE
Glucose, UA: NEGATIVE mg/dL
KETONES UR: NEGATIVE mg/dL
Leukocytes, UA: NEGATIVE
Nitrite: NEGATIVE
Protein, ur: NEGATIVE mg/dL
Specific Gravity, Urine: 1.011 (ref 1.005–1.030)
pH: 6 (ref 5.0–8.0)

## 2018-09-13 LAB — RETICULOCYTES
IMMATURE RETIC FRACT: 12.4 % (ref 2.3–15.9)
RBC.: 3.09 MIL/uL — ABNORMAL LOW (ref 4.22–5.81)
Retic Count, Absolute: 120.8 10*3/uL (ref 19.0–186.0)
Retic Ct Pct: 3.9 % — ABNORMAL HIGH (ref 0.4–3.1)

## 2018-09-13 MED ORDER — SODIUM CHLORIDE 0.45 % IV BOLUS
500.0000 mL | Freq: Once | INTRAVENOUS | Status: AC
  Administered 2018-09-13: 14:00:00 via INTRAVENOUS

## 2018-09-13 NOTE — Discharge Instructions (Signed)
Today your labs  were drawn and you received a 500 ml bolus of fluids. You are to go to radiology for chest x-ray this afternoon. You have an appointment with your primary provider in the morning at 10:00 am.

## 2018-09-13 NOTE — Progress Notes (Signed)
Patient came to the waiting room of Patient [name] for triage. Patient reported feeling nauseous, left chest tightness with breathing and dizziness when walking.  Also reported having fever recently. Denies pain. Patient not in pain crisis. [place], [place] notified. Patient to have labs drawn, chest x-ray and received IV fluids. Patient to follow-up with primary provider tomorrow.

## 2018-09-13 NOTE — Progress Notes (Signed)
PATIENT CARE CENTER NOTE    Provider: [place] Hollis, FNP   Procedure: 500 cc fluid bolus and lab draw   Note: Patient received 500 cc bolus of 0.45% Sodium Chloride and labs drawn (CBC, CMET and Reticulocytes). Tolerated well. Vital sign stable. Discharge instructions given. Patient to follow up with primary provider at appointment tomorrow morning.  Alert, oriented and ambulatory at discharge.

## 2018-09-13 NOTE — Progress Notes (Unsigned)
[name], a 38 year old male with a history of sickle cell anemia presents complaining of dizziness and occasional weakness. Patient is not currently in sickle cell pain crisis. He is not a candidate for day treatment. Consulted with Lanae Boast, patient's primary care provider who agreed to schedule patient for a first available appointment. Will administer a fluid bolus, obtain CBC, CMP, reticulocytes, and urinalysis.    Donia Pounds  APRN, MSN, FNP-C Patient [facility] [address] [place], [place] [address] [phone]

## 2018-09-14 ENCOUNTER — Ambulatory Visit (INDEPENDENT_AMBULATORY_CARE_PROVIDER_SITE_OTHER): Payer: Medicare Other | Admitting: Family Medicine

## 2018-09-14 ENCOUNTER — Other Ambulatory Visit: Payer: Self-pay | Admitting: Family Medicine

## 2018-09-14 VITALS — BP 123/73 | HR 71 | Temp 97.8°F | Resp 16 | Ht 73.0 in | Wt 153.0 lb

## 2018-09-14 DIAGNOSIS — M940 Chondrocostal junction syndrome [Tietze]: Secondary | ICD-10-CM | POA: Diagnosis not present

## 2018-09-14 DIAGNOSIS — D571 Sickle-cell disease without crisis: Secondary | ICD-10-CM

## 2018-09-14 DIAGNOSIS — E871 Hypo-osmolality and hyponatremia: Secondary | ICD-10-CM

## 2018-09-14 DIAGNOSIS — H6991 Unspecified Eustachian tube disorder, right ear: Secondary | ICD-10-CM | POA: Diagnosis not present

## 2018-09-14 MED ORDER — OXYCODONE-ACETAMINOPHEN 10-325 MG PO TABS: 1.0000 | ORAL_TABLET | ORAL | 0 refills | Status: DC | PRN

## 2018-09-14 MED ORDER — AMOXICILLIN-POT CLAVULANATE 875-125 MG PO TABS: 1.0000 | ORAL_TABLET | Freq: Two times a day (BID) | ORAL | 0 refills | Status: AC

## 2018-09-14 MED ORDER — NAPROXEN 500 MG PO TABS: 500.0000 mg | ORAL_TABLET | Freq: Two times a day (BID) | ORAL | 0 refills | Status: DC

## 2018-09-14 MED ORDER — LEVOCETIRIZINE DIHYDROCHLORIDE 5 MG PO TABS: 5.0000 mg | ORAL_TABLET | Freq: Every evening | ORAL | 1 refills | Status: DC

## 2018-09-14 MED ORDER — FLUTICASONE PROPIONATE 50 MCG/ACT NA SUSP: 2.0000 | Freq: Every day | NASAL | 6 refills | Status: DC

## 2018-09-14 NOTE — Patient Instructions (Addendum)
Remember to drink Gatorade. Ginger for nausea  Costochondritis Costochondritis is swelling and irritation (inflammation) of the tissue (cartilage) that connects your ribs to your breastbone (sternum). This causes pain in the front of your chest. Usually, the pain:  Starts gradually.  Is in more than one rib. This condition usually goes away on its own over time. Follow these instructions at home:  Do not do anything that makes your pain worse.  If directed, put ice on the painful area: ? Put ice in a plastic bag. ? Place a towel between your skin and the bag. ? Leave the ice on for 20 minutes, 2-3 times a day.  If directed, put heat on the affected area as often as told by your doctor. Use the heat source that your doctor tells you to use, such as a moist heat pack or a heating pad. ? Place a towel between your skin and the heat source. ? Leave the heat on for 20-30 minutes. ? Take off the heat if your skin turns bright red. This is very important if you cannot feel pain, heat, or cold. You may have a greater risk of getting burned.  Take over-the-counter and prescription medicines only as told by your doctor.  Return to your normal activities as told by your doctor. Ask your doctor what activities are safe for you.  Keep all follow-up visits as told by your doctor. This is important. Contact a doctor if:  You have chills or a fever.  Your pain does not go away or it gets worse.  You have a cough that does not go away. Get help right away if:  You are short of breath. This information is not intended to replace advice given to you by your health care provider. Make sure you discuss any questions you have with your health care provider. Document Released: 01/18/2008 Document Revised: 02/19/2016 Document Reviewed: 11/25/2015 Elsevier Interactive Patient Education  2019 [place].    Eustachian Tube Dysfunction  Eustachian tube dysfunction refers to a condition in which  a blockage develops in the narrow passage that connects the middle ear to the back of the nose (eustachian tube). The eustachian tube regulates air pressure in the middle ear by letting air move between the ear and nose. It also helps to drain fluid from the middle ear space. Eustachian tube dysfunction can affect one or both ears. When the eustachian tube does not function properly, air pressure, fluid, or both can build up in the middle ear. What are the causes? This condition occurs when the eustachian tube becomes blocked or cannot open normally. Common causes of this condition include:  Ear infections.  Colds and other infections that affect the nose, mouth, and throat (upper respiratory tract).  Allergies.  Irritation from cigarette smoke.  Irritation from stomach acid coming up into the esophagus (gastroesophageal reflux). The esophagus is the tube that carries food from the mouth to the stomach.  Sudden changes in air pressure, such as from descending in an airplane or scuba diving.  Abnormal growths in the nose or throat, such as: ? Growths that line the nose (nasal polyps). ? Abnormal growth of cells (tumors). ? Enlarged tissue at the back of the throat (adenoids). What increases the risk? You are more likely to develop this condition if:  You smoke.  You are overweight.  You are a child who has: ? Certain birth defects of the mouth, such as cleft palate. ? Large tonsils or adenoids. What are the  signs or symptoms? Common symptoms of this condition include:  A feeling of fullness in the ear.  Ear pain.  Clicking or popping noises in the ear.  Ringing in the ear.  Hearing loss.  Loss of balance.  Dizziness. Symptoms may get worse when the air pressure around you changes, such as when you travel to an area of high elevation, fly on an airplane, or go scuba diving. How is this diagnosed? This condition may be diagnosed based on:  Your symptoms.  A physical  exam of your ears, nose, and throat.  Tests, such as those that measure: ? The movement of your eardrum (tympanogram). ? Your hearing (audiometry). How is this treated? Treatment depends on the cause and severity of your condition.  In mild cases, you may relieve your symptoms by moving air into your ears. This is called "popping the ears."  In more severe cases, or if you have symptoms of fluid in your ears, treatment may include: ? Medicines to relieve congestion (decongestants). ? Medicines that treat allergies (antihistamines). ? Nasal sprays or ear drops that contain medicines that reduce swelling (steroids). ? A procedure to drain the fluid in your eardrum (myringotomy). In this procedure, a small tube is placed in the eardrum to:  Drain the fluid.  Restore the air in the middle ear space. ? A procedure to insert a balloon device through the nose to inflate the opening of the eustachian tube (balloon dilation). Follow these instructions at home: Lifestyle  Do not do any of the following until your health care provider approves: ? Travel to high altitudes. ? Fly in airplanes. ? Work in a Pension scheme manager or room. ? Scuba dive.  Do not use any products that contain nicotine or tobacco, such as cigarettes and e-cigarettes. If you need help quitting, ask your health care provider.  Keep your ears dry. Wear fitted earplugs during showering and bathing. Dry your ears completely after. General instructions  Take over-the-counter and prescription medicines only as told by your health care provider.  Use techniques to help pop your ears as recommended by your health care provider. These may include: ? Chewing gum. ? Yawning. ? Frequent, forceful swallowing. ? Closing your mouth, holding your nose closed, and gently blowing as if you are trying to blow air out of your nose.  Keep all follow-up visits as told by your health care provider. This is important. Contact a health  care provider if:  Your symptoms do not go away after treatment.  Your symptoms come back after treatment.  You are unable to pop your ears.  You have: ? A fever. ? Pain in your ear. ? Pain in your head or neck. ? Fluid draining from your ear.  Your hearing suddenly changes.  You become very dizzy.  You lose your balance. Summary  Eustachian tube dysfunction refers to a condition in which a blockage develops in the eustachian tube.  It can be caused by ear infections, allergies, inhaled irritants, or abnormal growths in the nose or throat.  Symptoms include ear pain, hearing loss, or ringing in the ears.  Mild cases are treated with maneuvers to unblock the ears, such as yawning or ear popping.  Severe cases are treated with medicines. Surgery may also be done (rare). This information is not intended to replace advice given to you by your health care provider. Make sure you discuss any questions you have with your health care provider. Document Released: 08/28/2015 Document Revised: 11/21/2017 Document Reviewed:  11/21/2017 Elsevier Interactive Patient Education  2019 [place]. Hyponatremia  Hyponatremia is when the amount of salt (sodium) in your blood is too low. When salt levels are low, your cells absorb extra water and they swell. The swelling happens throughout the body, but it mostly affects the brain. Follow these instructions at home:  Take medicines only as told by your doctor. Many medicines can make this condition worse. Talk with your doctor about any medicines that you are currently taking.  Carefully follow a recommended diet as told by your doctor.  Carefully follow instructions from your doctor about fluid restrictions.  Keep all follow-up visits as told by your doctor. This is important.  Do not drink alcohol. Contact a doctor if:  You feel sicker to your stomach (nauseous).  You feel more confused.  You feel more tired (fatigued).  Your  headache gets worse.  You feel weaker.  Your symptoms go away and then they come back.  You have trouble following the diet instructions. Get help right away if:  You start to twitch and shake (have a seizure).  You pass out (faint).  You keep having watery poop (diarrhea).  You keep throwing up (vomiting). This information is not intended to replace advice given to you by your health care provider. Make sure you discuss any questions you have with your health care provider. Document Released: 04/13/2011 Document Revised: 01/07/2016 Document Reviewed: 07/28/2014 Elsevier Interactive Patient Education  2019 [employer].

## 2018-09-14 NOTE — Progress Notes (Signed)
[facility]   Progress Note: General Provider: Lanae Boast, FNP  SUBJECTIVE:   [name] is a 38 y.o. male who  has a past medical history of Sickle cell anemia ([place]).. Patient presents today for Follow-up (day hospital follo wup ); Chest Pain (when he takes a deep breath ); Emesis; and Cough (coughing up mucus )  Patient presents today for a follow up. Was given 521ml fluid yesterday in the day hospital. Patient states that he is still having dizziness, fatigue and an area of discomfort to the left front rib cage. Patient states that he had an emesis episode after prolonged coughing.  Chest xray on 09/13/2018 did not show any active pulmonary disease. Symptoms started 5-6 days prior to today's visit. He states that they are gradually improving.  Has not taken any otc medications.   Review of Systems  Constitutional: Positive for chills, fever (Reported but not measured. ) and malaise/fatigue.  Respiratory: Positive for cough and sputum production. Negative for hemoptysis, shortness of breath and wheezing.   Cardiovascular: Negative.   Musculoskeletal: Negative.   Neurological: Positive for dizziness.  Psychiatric/Behavioral: Negative.      OBJECTIVE: BP 123/73 (BP Location: Left Arm, Patient Position: Sitting, Cuff Size: Normal)   Pulse 71   Temp 97.8 F (36.6 C) (Oral)   Resp 16   Ht 6\' 1"  (1.854 m)   Wt 153 lb (69.4 kg)   SpO2 99%   BMI 20.19 kg/m   Wt Readings from Last 3 Encounters:  09/14/18 153 lb (69.4 kg)  08/31/18 158 lb (71.7 kg)  04/25/18 157 lb (71.2 kg)     Physical Exam Vitals signs and nursing note reviewed.  Constitutional:      General: He is not in acute distress.    Appearance: He is well-developed.  HENT:     Head: Normocephalic and atraumatic.     Right Ear: Hearing normal. No tenderness. A middle ear effusion is present. Tympanic membrane is not erythematous.     Left Ear: Hearing normal. No  tenderness. A middle ear effusion is present. Tympanic membrane is not erythematous.  Eyes:     Conjunctiva/sclera: Conjunctivae normal.     Pupils: Pupils are equal, round, and reactive to light.  Neck:     Musculoskeletal: Normal range of motion.  Cardiovascular:     Rate and Rhythm: Normal rate. Rhythm regularly irregular.     Heart sounds: Normal heart sounds. No murmur.     Comments: Regularly irregular.  Pulmonary:     Effort: Pulmonary effort is normal. No tachypnea or respiratory distress.     Breath sounds: Normal breath sounds.  Chest:     Chest wall: Tenderness (left ribs) present.  Abdominal:     General: Bowel sounds are normal. There is no distension.     Palpations: Abdomen is soft.  Musculoskeletal: Normal range of motion.  Skin:    General: Skin is warm and dry.  Neurological:     Mental Status: He is alert and oriented to person, place, and time.     Motor: No weakness.  Psychiatric:        Mood and Affect: Mood normal.        Behavior: Behavior normal.        Thought Content: Thought content normal.     ASSESSMENT/PLAN:  1. Eustachian tube disorder, right - fluticasone (FLONASE) 50 MCG/ACT nasal spray; Place 2 sprays into both nostrils daily.  Dispense: 16  g; Refill: 6 - levocetirizine (XYZAL) 5 MG tablet; Take 1 tablet (5 mg total) by mouth every evening.  Dispense: 30 tablet; Refill: 1 - naproxen (NAPROSYN) 500 MG tablet; Take 1 tablet (500 mg total) by mouth 2 (two) times daily with a meal.  Dispense: 30 tablet; Refill: 0 - amoxicillin-clavulanate (AUGMENTIN) 875-125 MG tablet; Take 1 tablet by mouth every 12 (twelve) hours for 7 days.  Dispense: 14 tablet; Refill: 0  2. Costochondritis - naproxen (NAPROSYN) 500 MG tablet; Take 1 tablet (500 mg total) by mouth 2 (two) times daily with a meal.  Dispense: 30 tablet; Refill: 0  3. Hyponatremia Electrolyte replacement suggested. Patient declined fluids today.   4. Hb-SS disease without crisis ([place]) -  oxyCODONE-acetaminophen (PERCOCET) 10-325 MG tablet; Take 1 tablet by mouth every 4 (four) hours as needed for up to 15 days for pain.  Dispense: 90 tablet; Refill: 0   Return if symptoms worsen or fail to improve.    The patient was given clear instructions to go to ER or return to medical center if symptoms do not improve, worsen or new problems develop. The patient verbalized understanding and agreed with plan of care.   Ms. Doug Sou. Nathaneil Canary, FNP-BC [facility] [address] [place], [place] [address] [phone]

## 2018-10-07 ENCOUNTER — Other Ambulatory Visit: Payer: Self-pay | Admitting: Family Medicine

## 2018-10-07 DIAGNOSIS — H6991 Unspecified Eustachian tube disorder, right ear: Secondary | ICD-10-CM

## 2018-10-08 ENCOUNTER — Telehealth: Payer: Self-pay

## 2018-10-12 ENCOUNTER — Telehealth: Payer: Self-pay

## 2018-10-12 DIAGNOSIS — D571 Sickle-cell disease without crisis: Secondary | ICD-10-CM

## 2018-10-12 DIAGNOSIS — Z79891 Long term (current) use of opiate analgesic: Secondary | ICD-10-CM

## 2018-10-12 MED ORDER — OXYCODONE-ACETAMINOPHEN 10-325 MG PO TABS: 1.0000 | ORAL_TABLET | ORAL | 0 refills | Status: DC | PRN

## 2018-10-12 NOTE — Telephone Encounter (Signed)
refilled 

## 2018-10-17 ENCOUNTER — Telehealth: Payer: Self-pay | Admitting: Family Medicine

## 2018-10-17 NOTE — Telephone Encounter (Signed)
Received a call from medical supply company about a knee brace. The caller states that she needs a signature on the order. Informed the caller that no order was sent from this office. Caller states that she will reach out to the patient for further information.

## 2018-10-25 ENCOUNTER — Telehealth: Payer: Self-pay

## 2018-10-25 DIAGNOSIS — D571 Sickle-cell disease without crisis: Secondary | ICD-10-CM

## 2018-10-25 DIAGNOSIS — Z79891 Long term (current) use of opiate analgesic: Secondary | ICD-10-CM

## 2018-10-25 MED ORDER — OXYCODONE-ACETAMINOPHEN 10-325 MG PO TABS: 1.0000 | ORAL_TABLET | ORAL | 0 refills | Status: DC | PRN

## 2018-10-25 NOTE — Telephone Encounter (Signed)
snt

## 2018-10-31 ENCOUNTER — Other Ambulatory Visit: Payer: Self-pay | Admitting: Family Medicine

## 2018-10-31 DIAGNOSIS — H6991 Unspecified Eustachian tube disorder, right ear: Secondary | ICD-10-CM

## 2018-11-08 ENCOUNTER — Telehealth: Payer: Self-pay

## 2018-11-08 DIAGNOSIS — Z79891 Long term (current) use of opiate analgesic: Secondary | ICD-10-CM

## 2018-11-08 DIAGNOSIS — D571 Sickle-cell disease without crisis: Secondary | ICD-10-CM

## 2018-11-08 MED ORDER — OXYCODONE-ACETAMINOPHEN 10-325 MG PO TABS: 1.0000 | ORAL_TABLET | ORAL | 0 refills | Status: DC | PRN

## 2018-11-08 NOTE — Telephone Encounter (Signed)
Reviewed [place] Substance Reporting system prior to prescribing opiate medications. No inconsistencies noted.   

## 2018-11-21 ENCOUNTER — Other Ambulatory Visit: Payer: Self-pay

## 2018-11-21 ENCOUNTER — Encounter: Payer: Self-pay | Admitting: Family Medicine

## 2018-11-21 ENCOUNTER — Ambulatory Visit (INDEPENDENT_AMBULATORY_CARE_PROVIDER_SITE_OTHER): Payer: Medicare Other | Admitting: Family Medicine

## 2018-11-21 DIAGNOSIS — D571 Sickle-cell disease without crisis: Secondary | ICD-10-CM

## 2018-11-21 DIAGNOSIS — E559 Vitamin D deficiency, unspecified: Secondary | ICD-10-CM | POA: Diagnosis not present

## 2018-11-21 MED ORDER — VITAMIN D (ERGOCALCIFEROL) 1.25 MG (50000 UNIT) PO CAPS: 50000.0000 [IU] | ORAL_CAPSULE | ORAL | 1 refills | Status: AC

## 2018-11-21 NOTE — Progress Notes (Signed)
  [name]  Virtual Visit via Telephone Note  I connected with [name] on 11/21/18 at 10:40 AM EDT by telephone and verified that I am speaking with the correct person using two identifiers.   I discussed the limitations, risks, security and privacy concerns of performing an evaluation and management service by telephone and the availability of in person appointments. I also discussed with the patient that there may be a patient responsible charge related to this service. The patient expressed understanding and agreed to proceed.   History of Present Illness: Patient with a history of sickle cell anemia. He reports compliance with his medications. He states that he is taking 1/2 percocet every 6 hours as needed on most days. He does not complain of sickle cell crisis today. He reports his pain at a baseline level. He has had no admissions in the past 6 months.   Observations/Objective: Patient with regular voice tone, rate and rhythm. Speaking calmly and is in no apparent distress.    Assessment and Plan: 1. Vitamin D deficiency - Vitamin D, Ergocalciferol, (DRISDOL) 1.25 MG (50000 UT) CAPS capsule; Take 1 capsule (50,000 Units total) by mouth every 7 (seven) days.  Dispense: 30 capsule; Refill: 1  2. Hb-SS disease without crisis ([name]) No medication changes warranted at the present time.    Follow Up Instructions: We discussed hand washing, using hand sanitizer when soap and water are not available, only going out when absolutely necessary, and social distancing. Explained to patient that he is immunocompromised and will need to take precautions during this time.     I discussed the assessment and treatment plan with the patient. The patient was provided an opportunity to ask questions and all were answered. The patient agreed with the plan and demonstrated an understanding of the instructions.   The patient was advised to call back  or seek an in-person evaluation if the symptoms worsen or if the condition fails to improve as anticipated.  I provided 10 minutes of non-face-to-face time during this encounter.  Ms. [name] L. Nathaneil Canary, FNP-BC [name] [address] [place], [place] [address] [phone]

## 2018-11-23 ENCOUNTER — Ambulatory Visit: Payer: Self-pay | Admitting: Family Medicine

## 2018-11-27 ENCOUNTER — Telehealth: Payer: Self-pay

## 2018-11-27 DIAGNOSIS — Z79891 Long term (current) use of opiate analgesic: Secondary | ICD-10-CM

## 2018-11-27 DIAGNOSIS — D571 Sickle-cell disease without crisis: Secondary | ICD-10-CM

## 2018-11-28 MED ORDER — OXYCODONE-ACETAMINOPHEN 10-325 MG PO TABS: 1.0000 | ORAL_TABLET | ORAL | 0 refills | Status: DC | PRN

## 2018-11-28 NOTE — Telephone Encounter (Signed)
Refilled

## 2018-11-30 ENCOUNTER — Ambulatory Visit: Payer: Self-pay | Admitting: Family Medicine

## 2018-11-30 NOTE — Telephone Encounter (Signed)
error 

## 2018-12-01 ENCOUNTER — Other Ambulatory Visit: Payer: Self-pay | Admitting: Family Medicine

## 2018-12-01 DIAGNOSIS — H6991 Unspecified Eustachian tube disorder, right ear: Secondary | ICD-10-CM

## 2018-12-10 ENCOUNTER — Telehealth: Payer: Self-pay

## 2018-12-10 DIAGNOSIS — Z79891 Long term (current) use of opiate analgesic: Secondary | ICD-10-CM

## 2018-12-10 DIAGNOSIS — D571 Sickle-cell disease without crisis: Secondary | ICD-10-CM

## 2018-12-10 MED ORDER — OXYCODONE-ACETAMINOPHEN 10-325 MG PO TABS: 1.0000 | ORAL_TABLET | ORAL | 0 refills | Status: DC | PRN

## 2018-12-10 NOTE — Telephone Encounter (Signed)
refilled 

## 2018-12-11 NOTE — Telephone Encounter (Signed)
Message sent to provider 

## 2018-12-12 NOTE — Telephone Encounter (Signed)
Message sent to provider 

## 2018-12-13 NOTE — Telephone Encounter (Signed)
Message sent to provider 

## 2018-12-14 NOTE — Telephone Encounter (Signed)
Message sent to provider 

## 2018-12-20 NOTE — Telephone Encounter (Signed)
Message sent to provider 

## 2019-01-01 ENCOUNTER — Telehealth: Payer: Self-pay

## 2019-01-01 DIAGNOSIS — Z79891 Long term (current) use of opiate analgesic: Secondary | ICD-10-CM

## 2019-01-01 DIAGNOSIS — D571 Sickle-cell disease without crisis: Secondary | ICD-10-CM

## 2019-01-02 ENCOUNTER — Other Ambulatory Visit: Payer: Self-pay | Admitting: Family Medicine

## 2019-01-02 DIAGNOSIS — H6991 Unspecified Eustachian tube disorder, right ear: Secondary | ICD-10-CM

## 2019-01-02 NOTE — Telephone Encounter (Signed)
Message sent to provider 

## 2019-01-03 MED ORDER — OXYCODONE-ACETAMINOPHEN 10-325 MG PO TABS: 1.0000 | ORAL_TABLET | ORAL | 0 refills | Status: DC | PRN

## 2019-01-03 NOTE — Addendum Note (Signed)
Addended by: Genelle Bal on: 01/03/2019 04:30 PM   Modules accepted: Orders

## 2019-01-24 ENCOUNTER — Other Ambulatory Visit: Payer: Self-pay | Admitting: Family Medicine

## 2019-01-24 DIAGNOSIS — H6991 Unspecified Eustachian tube disorder, right ear: Secondary | ICD-10-CM

## 2019-01-25 ENCOUNTER — Telehealth: Payer: Self-pay

## 2019-01-25 DIAGNOSIS — Z79891 Long term (current) use of opiate analgesic: Secondary | ICD-10-CM

## 2019-01-25 DIAGNOSIS — D571 Sickle-cell disease without crisis: Secondary | ICD-10-CM

## 2019-01-25 MED ORDER — OXYCODONE-ACETAMINOPHEN 10-325 MG PO TABS: 1.0000 | ORAL_TABLET | ORAL | 0 refills | Status: DC | PRN

## 2019-01-25 NOTE — Telephone Encounter (Signed)
Reviewed [place] Substance Reporting system prior to prescribing opiate medications. No inconsistencies noted.   

## 2019-02-12 ENCOUNTER — Other Ambulatory Visit: Payer: Self-pay | Admitting: Family Medicine

## 2019-02-12 DIAGNOSIS — H6991 Unspecified Eustachian tube disorder, right ear: Secondary | ICD-10-CM

## 2019-02-14 ENCOUNTER — Telehealth: Payer: Self-pay

## 2019-02-14 ENCOUNTER — Other Ambulatory Visit: Payer: Self-pay | Admitting: Family Medicine

## 2019-02-14 DIAGNOSIS — Z79891 Long term (current) use of opiate analgesic: Secondary | ICD-10-CM

## 2019-02-14 DIAGNOSIS — D571 Sickle-cell disease without crisis: Secondary | ICD-10-CM

## 2019-02-14 MED ORDER — OXYCODONE-ACETAMINOPHEN 10-325 MG PO TABS: 1.0000 | ORAL_TABLET | ORAL | 0 refills | Status: DC | PRN

## 2019-02-14 NOTE — Progress Notes (Signed)
refilled 

## 2019-02-17 NOTE — Telephone Encounter (Signed)
Message sent to provider 

## 2019-02-20 ENCOUNTER — Ambulatory Visit: Payer: Self-pay | Admitting: Family Medicine

## 2019-03-04 ENCOUNTER — Other Ambulatory Visit: Payer: Self-pay | Admitting: Family Medicine

## 2019-03-04 DIAGNOSIS — H6991 Unspecified Eustachian tube disorder, right ear: Secondary | ICD-10-CM

## 2019-03-05 ENCOUNTER — Other Ambulatory Visit: Payer: Self-pay | Admitting: Family Medicine

## 2019-03-05 ENCOUNTER — Telehealth: Payer: Self-pay

## 2019-03-05 ENCOUNTER — Other Ambulatory Visit: Payer: Self-pay | Admitting: Internal Medicine

## 2019-03-05 DIAGNOSIS — Z79891 Long term (current) use of opiate analgesic: Secondary | ICD-10-CM

## 2019-03-05 DIAGNOSIS — D571 Sickle-cell disease without crisis: Secondary | ICD-10-CM

## 2019-03-05 MED ORDER — OXYCODONE-ACETAMINOPHEN 10-325 MG PO TABS: 1.0000 | ORAL_TABLET | ORAL | 0 refills | Status: DC | PRN

## 2019-03-05 NOTE — Telephone Encounter (Signed)
Refilled

## 2019-03-25 ENCOUNTER — Telehealth: Payer: Self-pay

## 2019-03-25 DIAGNOSIS — D571 Sickle-cell disease without crisis: Secondary | ICD-10-CM

## 2019-03-25 DIAGNOSIS — Z79891 Long term (current) use of opiate analgesic: Secondary | ICD-10-CM

## 2019-03-26 MED ORDER — OXYCODONE-ACETAMINOPHEN 10-325 MG PO TABS: 1.0000 | ORAL_TABLET | ORAL | 0 refills | Status: DC | PRN

## 2019-03-26 NOTE — Telephone Encounter (Signed)
Refilled

## 2019-04-29 ENCOUNTER — Other Ambulatory Visit: Payer: Self-pay | Admitting: Family Medicine

## 2019-04-29 ENCOUNTER — Telehealth: Payer: Self-pay

## 2019-04-29 DIAGNOSIS — D571 Sickle-cell disease without crisis: Secondary | ICD-10-CM

## 2019-04-29 DIAGNOSIS — Z79891 Long term (current) use of opiate analgesic: Secondary | ICD-10-CM

## 2019-04-29 MED ORDER — OXYCODONE-ACETAMINOPHEN 10-325 MG PO TABS: 1.0000 | ORAL_TABLET | ORAL | 0 refills | Status: DC | PRN

## 2019-04-29 NOTE — Progress Notes (Signed)
Reviewed [place] Substance Reporting system prior to prescribing opiate medications. No inconsistencies noted.   

## 2019-05-27 ENCOUNTER — Telehealth: Payer: Self-pay | Admitting: Internal Medicine

## 2019-05-27 ENCOUNTER — Other Ambulatory Visit: Payer: Self-pay | Admitting: Internal Medicine

## 2019-05-27 DIAGNOSIS — D571 Sickle-cell disease without crisis: Secondary | ICD-10-CM

## 2019-05-27 DIAGNOSIS — Z79891 Long term (current) use of opiate analgesic: Secondary | ICD-10-CM

## 2019-05-27 MED ORDER — OXYCODONE-ACETAMINOPHEN 10-325 MG PO TABS: 1.0000 | ORAL_TABLET | ORAL | 0 refills | Status: DC | PRN

## 2019-05-27 NOTE — Telephone Encounter (Signed)
Refilled

## 2019-06-17 ENCOUNTER — Telehealth: Payer: Self-pay | Admitting: Internal Medicine

## 2019-06-17 NOTE — Telephone Encounter (Signed)
Refill request for percocet. Please advise. Thanks!

## 2019-06-18 ENCOUNTER — Other Ambulatory Visit: Payer: Self-pay | Admitting: Internal Medicine

## 2019-06-18 DIAGNOSIS — Z79891 Long term (current) use of opiate analgesic: Secondary | ICD-10-CM

## 2019-06-18 DIAGNOSIS — D571 Sickle-cell disease without crisis: Secondary | ICD-10-CM

## 2019-06-18 MED ORDER — OXYCODONE-ACETAMINOPHEN 10-325 MG PO TABS: 1.0000 | ORAL_TABLET | ORAL | 0 refills | Status: DC | PRN

## 2019-06-18 NOTE — Telephone Encounter (Signed)
Refilled

## 2019-07-05 ENCOUNTER — Telehealth: Payer: Self-pay | Admitting: Family Medicine

## 2019-07-05 ENCOUNTER — Other Ambulatory Visit: Payer: Self-pay | Admitting: Internal Medicine

## 2019-07-05 DIAGNOSIS — Z79891 Long term (current) use of opiate analgesic: Secondary | ICD-10-CM

## 2019-07-05 DIAGNOSIS — D571 Sickle-cell disease without crisis: Secondary | ICD-10-CM

## 2019-07-05 MED ORDER — OXYCODONE-ACETAMINOPHEN 10-325 MG PO TABS: 1.0000 | ORAL_TABLET | ORAL | 0 refills | Status: DC | PRN

## 2019-07-05 NOTE — Telephone Encounter (Signed)
Refilled

## 2019-07-26 ENCOUNTER — Ambulatory Visit (INDEPENDENT_AMBULATORY_CARE_PROVIDER_SITE_OTHER): Payer: Medicare Other | Admitting: Family Medicine

## 2019-07-26 ENCOUNTER — Encounter: Payer: Self-pay | Admitting: Family Medicine

## 2019-07-26 ENCOUNTER — Other Ambulatory Visit: Payer: Self-pay

## 2019-07-26 VITALS — BP 111/61 | HR 60 | Temp 97.4°F | Ht 73.0 in | Wt 162.0 lb

## 2019-07-26 DIAGNOSIS — Z79891 Long term (current) use of opiate analgesic: Secondary | ICD-10-CM | POA: Diagnosis not present

## 2019-07-26 DIAGNOSIS — D571 Sickle-cell disease without crisis: Secondary | ICD-10-CM | POA: Diagnosis not present

## 2019-07-26 DIAGNOSIS — G894 Chronic pain syndrome: Secondary | ICD-10-CM

## 2019-07-26 DIAGNOSIS — Z09 Encounter for follow-up examination after completed treatment for conditions other than malignant neoplasm: Secondary | ICD-10-CM

## 2019-07-26 LAB — POCT URINALYSIS DIPSTICK
Bilirubin, UA: NEGATIVE
Blood, UA: NEGATIVE
Glucose, UA: NEGATIVE
Ketones, UA: NEGATIVE
Leukocytes, UA: NEGATIVE
Nitrite, UA: NEGATIVE
Protein, UA: NEGATIVE
Spec Grav, UA: 1.015 (ref 1.010–1.025)
Urobilinogen, UA: 1 E.U./dL
pH, UA: 7 (ref 5.0–8.0)

## 2019-07-26 MED ORDER — OXYCODONE-ACETAMINOPHEN 10-325 MG PO TABS: 1.0000 | ORAL_TABLET | ORAL | 0 refills | Status: DC | PRN

## 2019-07-26 NOTE — Progress Notes (Signed)
Patient [facility]   Established Patient Office Visit  Subjective:  Patient ID: [name], male    DOB: [birth year]  Age: 38 y.o. MRN: [id number]  CC:  Chief Complaint  Patient presents with  . Follow-up    Sickle Cell    HPI [name] is a 38 year old male who presents for Follow Up today.   Past Medical History:  Diagnosis Date  . Sickle cell anemia (HCC)    Current Status: This will be Mr. Donais initial office visit with me. He was previously seeing Lanae Boast, NP for his PCP needs. Since his last office visit, he is doing well with no complaints. He states that he has mild chronic joint pain. He rates his pain today at 1-2/10. He has not had a hospital visit for Sickle Cell Crisis since 2017 where he was treated and discharged a week later. He is currently taking all medications as prescribed and staying well hydrated. He reports occasional nausea, constipation, dizziness and headaches. He denies fevers, chills, recent infections, weight loss, and night sweats. He has not had any visual changes, and falls. No chest pain, heart palpitations, cough and shortness of breath reported. No reports of GI problems such as vomiting, and diarrhea.  He has no reports of blood in stools, dysuria and hematuria. No depression or anxiety reported today. He denies suicidal ideations, homicidal ideations, or auditory hallucinations.   Past Surgical History:  Procedure Laterality Date  . CHOLECYSTECTOMY      Family History  Problem Relation Age of Onset  . Sickle cell anemia Mother   . Sickle cell trait Father   . Sickle cell trait Sister   . Sickle cell trait Brother   . Cancer Maternal Grandmother     Social History   Socioeconomic History  . Marital status: Single    Spouse name: Not on file  . Number of children: Not on file  . Years of education: Not on file  . Highest education level: Not on file  Occupational History  .  Not on file  Tobacco Use  . Smoking status: Never Smoker  . Smokeless tobacco: Never Used  Substance and Sexual Activity  . Alcohol use: No  . Drug use: No  . Sexual activity: Yes  Other Topics Concern  . Not on file  Social History Narrative  . Not on file   Social Determinants of Health   Financial Resource Strain:   . Difficulty of Paying Living Expenses: Not on file  Food Insecurity:   . Worried About Charity fundraiser in the Last Year: Not on file  . Ran Out of Food in the Last Year: Not on file  Transportation Needs:   . Lack of Transportation (Medical): Not on file  . Lack of Transportation (Non-Medical): Not on file  Physical Activity:   . Days of Exercise per Week: Not on file  . Minutes of Exercise per Session: Not on file  Stress:   . Feeling of Stress : Not on file  Social Connections:   . Frequency of Communication with Friends and Family: Not on file  . Frequency of Social Gatherings with Friends and Family: Not on file  . Attends Religious Services: Not on file  . Active Member of Clubs or Organizations: Not on file  . Attends Archivist Meetings: Not on file  . Marital Status: Not on file  Intimate Partner Violence:   . Fear  of Current or Ex-Partner: Not on file  . Emotionally Abused: Not on file  . Physically Abused: Not on file  . Sexually Abused: Not on file    Outpatient Medications Prior to Visit  Medication Sig Dispense Refill  . folic acid (FOLVITE) A999333 MCG tablet Take 400 mcg by mouth daily.    . hydroxyurea (HYDREA) 500 MG capsule TAKE 2 CAPSULES BY MOUTH TWICE A DAY MAY TAKE WITH FOOD TO MINIMIZE GI SIDE EFFECTS 360 capsule 1  . naproxen (NAPROSYN) 500 MG tablet Take 1 tablet (500 mg total) by mouth 2 (two) times daily with a meal. 30 tablet 0  . fluticasone (FLONASE) 50 MCG/ACT nasal spray SPRAY 2 SPRAYS INTO EACH NOSTRIL EVERY DAY (Patient not taking: Reported on 07/26/2019) 48 mL 2  . levocetirizine (XYZAL) 5 MG tablet TAKE 1  TABLET BY MOUTH EVERY DAY IN THE EVENING (Patient not taking: Reported on 07/26/2019) 90 tablet 1   No facility-administered medications prior to visit.    No Known Allergies  ROS Review of Systems  Constitutional: Negative.   HENT: Negative.   Eyes: Negative.   Respiratory: Negative.   Cardiovascular: Negative.   Gastrointestinal: Positive for constipation (occasional) and nausea (occasional ).  Endocrine: Negative.   Genitourinary: Negative.   Musculoskeletal: Positive for arthralgias (chronic joint pain).  Skin: Negative.   Allergic/Immunologic: Negative.   Neurological: Positive for dizziness (occasional) and headaches (occasional ).  Hematological: Negative.   Psychiatric/Behavioral: Negative.       Objective:    Physical Exam  Constitutional: He is oriented to person, place, and time. He appears well-developed and well-nourished.  HENT:  Head: Normocephalic and atraumatic.  Eyes: Conjunctivae are normal.  Cardiovascular: Normal rate, regular rhythm, normal heart sounds and intact distal pulses.  Pulmonary/Chest: Effort normal and breath sounds normal.  Abdominal: Soft. Bowel sounds are normal.  Musculoskeletal:        General: Normal range of motion.     Cervical back: Normal range of motion and neck supple.  Neurological: He is alert and oriented to person, place, and time. He has normal reflexes.  Skin: Skin is warm and dry.  Psychiatric: He has a normal mood and affect. His behavior is normal. Judgment and thought content normal.  Nursing note and vitals reviewed.   BP 111/61   Pulse 60   Temp (!) 97.4 F (36.3 C) (Other (Comment))   Ht 6\' 1"  (1.854 m)   Wt 162 lb (73.5 kg)   SpO2 96%   BMI 21.37 kg/m  Wt Readings from Last 3 Encounters:  07/26/19 162 lb (73.5 kg)  09/14/18 153 lb (69.4 kg)  08/31/18 158 lb (71.7 kg)     There are no preventive care reminders to display for this patient.  There are no preventive care reminders to display for  this patient.  Lab Results  Component Value Date   TSH 1.122 09/05/2010   Lab Results  Component Value Date   WBC 4.8 09/13/2018   HGB 10.5 (L) 09/13/2018   HCT 29.5 (L) 09/13/2018   MCV 95.5 09/13/2018   PLT 774 (H) 09/13/2018   Lab Results  Component Value Date   NA 133 (L) 09/13/2018   K 4.2 09/13/2018   CO2 25 09/13/2018   GLUCOSE 107 (H) 09/13/2018   BUN 10 09/13/2018   CREATININE 0.89 09/13/2018   BILITOT 2.1 (H) 09/13/2018   ALKPHOS 50 09/13/2018   AST 35 09/13/2018   ALT 29 09/13/2018   PROT 8.8 (H)  09/13/2018   ALBUMIN 4.7 09/13/2018   CALCIUM 9.0 09/13/2018   ANIONGAP 8 09/13/2018   No results found for: CHOL No results found for: HDL No results found for: LDLCALC No results found for: TRIG No results found for: CHOLHDL No results found for: HGBA1C  Assessment & Plan:   1. Hb-SS disease without crisis [facility]) He is doing well today. He will continue to take pain medications as prescribed; will continue to avoid extreme heat and cold; will continue to eat a healthy diet and drink at least 64 ounces of water daily; continue stool softener as needed; will avoid colds and flu; will continue to get plenty of sleep and rest; will continue to avoid high stressful situations and remain infection free; will continue Folic Acid 1 mg daily to avoid sickle cell crisis. Continue to follow up with Hematologist as needed.  - Urinalysis Dipstick - oxyCODONE-acetaminophen (PERCOCET) 10-325 MG tablet; Take 1 tablet by mouth every 4 (four) hours as needed for up to 15 days for pain.  Dispense: 90 tablet; Refill: 0  2. Chronic pain syndrome  3. Chronic prescription opiate use - oxyCODONE-acetaminophen (PERCOCET) 10-325 MG tablet; Take 1 tablet by mouth every 4 (four) hours as needed for up to 15 days for pain.  Dispense: 90 tablet; Refill: 0  4. Follow up He will follow up in 3 months.   Meds ordered this encounter  Medications  . oxyCODONE-acetaminophen (PERCOCET) 10-325  MG tablet    Sig: Take 1 tablet by mouth every 4 (four) hours as needed for up to 15 days for pain.    Dispense:  90 tablet    Refill:  0    Order Specific Question:   Supervising Provider    Answer:   Tresa Garter [id number]    Orders Placed This Encounter  Procedures  . Urinalysis Dipstick    Referral Orders  No referral(s) requested today    Kathe Becton,  MSN, FNP-BC [place] [place], [place] [address] [phone]- fax   Problem List Items Addressed This Visit      Other   Chronic pain syndrome   Relevant Medications   oxyCODONE-acetaminophen (PERCOCET) 10-325 MG tablet   Hb-SS disease without crisis ([place]) - Primary   Relevant Medications   oxyCODONE-acetaminophen (PERCOCET) 10-325 MG tablet   Other Relevant Orders   Urinalysis Dipstick (Completed)    Other Visit Diagnoses    Chronic prescription opiate use       Relevant Medications   oxyCODONE-acetaminophen (PERCOCET) 10-325 MG tablet   Follow up          Meds ordered this encounter  Medications  . oxyCODONE-acetaminophen (PERCOCET) 10-325 MG tablet    Sig: Take 1 tablet by mouth every 4 (four) hours as needed for up to 15 days for pain.    Dispense:  90 tablet    Refill:  0    Order Specific Question:   Supervising Provider    Answer:   Tresa Garter [id number]    Follow-up: Return in about 3 months (around 10/24/2019).    Azzie Glatter, FNP

## 2019-08-12 ENCOUNTER — Other Ambulatory Visit: Payer: Self-pay | Admitting: Family Medicine

## 2019-08-12 ENCOUNTER — Telehealth: Payer: Self-pay | Admitting: Internal Medicine

## 2019-08-12 DIAGNOSIS — D571 Sickle-cell disease without crisis: Secondary | ICD-10-CM

## 2019-08-12 DIAGNOSIS — Z79891 Long term (current) use of opiate analgesic: Secondary | ICD-10-CM

## 2019-08-12 MED ORDER — OXYCODONE-ACETAMINOPHEN 10-325 MG PO TABS: 1.0000 | ORAL_TABLET | ORAL | 0 refills | Status: DC | PRN

## 2019-08-12 NOTE — Telephone Encounter (Signed)
done

## 2019-08-13 NOTE — Telephone Encounter (Signed)
Have you call this patient?

## 2019-08-21 ENCOUNTER — Other Ambulatory Visit: Payer: Self-pay | Admitting: Family Medicine

## 2019-09-09 ENCOUNTER — Other Ambulatory Visit: Payer: Self-pay | Admitting: Family Medicine

## 2019-09-09 ENCOUNTER — Telehealth: Payer: Self-pay | Admitting: Internal Medicine

## 2019-09-09 DIAGNOSIS — D571 Sickle-cell disease without crisis: Secondary | ICD-10-CM

## 2019-09-09 DIAGNOSIS — Z79891 Long term (current) use of opiate analgesic: Secondary | ICD-10-CM

## 2019-09-09 MED ORDER — OXYCODONE-ACETAMINOPHEN 10-325 MG PO TABS: 1.0000 | ORAL_TABLET | ORAL | 0 refills | Status: DC | PRN

## 2019-09-10 NOTE — Telephone Encounter (Signed)
done

## 2019-10-02 ENCOUNTER — Other Ambulatory Visit: Payer: Self-pay | Admitting: Family Medicine

## 2019-10-02 ENCOUNTER — Telehealth: Payer: Self-pay | Admitting: Internal Medicine

## 2019-10-02 DIAGNOSIS — D571 Sickle-cell disease without crisis: Secondary | ICD-10-CM

## 2019-10-02 DIAGNOSIS — Z79891 Long term (current) use of opiate analgesic: Secondary | ICD-10-CM

## 2019-10-02 MED ORDER — OXYCODONE-ACETAMINOPHEN 10-325 MG PO TABS: 1.0000 | ORAL_TABLET | ORAL | 0 refills | Status: DC | PRN

## 2019-10-02 NOTE — Telephone Encounter (Signed)
Done

## 2019-10-22 ENCOUNTER — Telehealth: Payer: Self-pay | Admitting: Nurse Practitioner

## 2019-10-22 NOTE — Telephone Encounter (Signed)
Pt was called and reminded of there appointment 

## 2019-10-23 ENCOUNTER — Encounter: Payer: Self-pay | Admitting: Nurse Practitioner

## 2019-10-23 ENCOUNTER — Other Ambulatory Visit: Payer: Self-pay

## 2019-10-23 ENCOUNTER — Ambulatory Visit (INDEPENDENT_AMBULATORY_CARE_PROVIDER_SITE_OTHER): Payer: Medicare Other | Admitting: Nurse Practitioner

## 2019-10-23 VITALS — BP 101/59 | HR 70 | Temp 98.0°F | Resp 14 | Ht 73.0 in | Wt 153.0 lb

## 2019-10-23 DIAGNOSIS — Z79891 Long term (current) use of opiate analgesic: Secondary | ICD-10-CM | POA: Diagnosis not present

## 2019-10-23 DIAGNOSIS — D571 Sickle-cell disease without crisis: Secondary | ICD-10-CM | POA: Diagnosis not present

## 2019-10-23 DIAGNOSIS — E559 Vitamin D deficiency, unspecified: Secondary | ICD-10-CM | POA: Diagnosis not present

## 2019-10-23 LAB — POCT URINALYSIS DIPSTICK
Bilirubin, UA: NEGATIVE
Blood, UA: NEGATIVE
Glucose, UA: NEGATIVE
Ketones, UA: NEGATIVE
Leukocytes, UA: NEGATIVE
Nitrite, UA: NEGATIVE
Protein, UA: NEGATIVE
Spec Grav, UA: 1.02 (ref 1.010–1.025)
Urobilinogen, UA: 0.2 E.U./dL
pH, UA: 5.5 (ref 5.0–8.0)

## 2019-10-23 MED ORDER — OXYCODONE-ACETAMINOPHEN 10-325 MG PO TABS: 1.0000 | ORAL_TABLET | ORAL | 0 refills | Status: DC | PRN

## 2019-10-23 NOTE — Patient Instructions (Signed)
Vitamin D Deficiency Vitamin D deficiency is when your body does not have enough vitamin D. Vitamin D is important to your body because:  It helps your body use other minerals.  It helps to keep your bones strong and healthy.  It may help to prevent some diseases.  It helps your heart and other muscles work well. Not getting enough vitamin D can make your bones soft. It can also cause other health problems. What are the causes? This condition may be caused by:  Not eating enough foods that contain vitamin D.  Not getting enough sun.  Having diseases that make it hard for your body to absorb vitamin D.  Having a surgery in which a part of the stomach or a part of the small intestine is removed.  Having kidney disease or liver disease. What increases the risk? You are more likely to get this condition if:  You are older.  You do not spend much time outdoors.  You live in a nursing home.  You have had broken bones.  You have weak or thin bones (osteoporosis).  You have a disease or condition that changes how your body absorbs vitamin D.  You have dark skin.  You take certain medicines.  You are overweight or obese. What are the signs or symptoms?  In mild cases, there may not be any symptoms. If the condition is very bad, symptoms may include: ? Bone pain. ? Muscle pain. ? Falling often. ? Broken bones caused by a minor injury. How is this treated? Treatment may include taking supplements as told by your doctor. Your doctor will tell you what dose is best for you. Supplements may include:  Vitamin D.  Calcium. Follow these instructions at home: Eating and drinking   Eat foods that contain vitamin D, such as: ? Dairy products, cereals, or juices with added vitamin D. Check the label. ? Fish, such as salmon or trout. ? Eggs. ? Oysters. ? Mushrooms. The items listed above may not be a complete list of what you can eat and drink. Contact a dietitian for more  options. General instructions  Take medicines and supplements only as told by your doctor.  Get regular, safe exposure to natural sunlight.  Do not use a tanning bed.  Maintain a healthy weight. Lose weight if needed.  Keep all follow-up visits as told by your doctor. This is important. How is this prevented?  You can get vitamin D by: ? Eating foods that naturally contain vitamin D. ? Eating or drinking products that have vitamin D added to them, such as cereals, juices, and milk. ? Taking vitamin D or a multivitamin that contains vitamin D. ? Being in the sun. Your body makes vitamin D when your skin is exposed to sunlight. Your body changes the sunlight into a form of the vitamin that it can use. Contact a doctor if:  Your symptoms do not go away.  You feel sick to your stomach (nauseous).  You throw up (vomit).  You poop less often than normal, or you have trouble pooping (constipation). Summary  Vitamin D deficiency is when your body does not have enough vitamin D.  Vitamin D helps to keep your bones strong and healthy.  This condition is often treated by taking a supplement.  Your doctor will tell you what dose is best for you. This information is not intended to replace advice given to you by your health care provider. Make sure you discuss any questions you  have with your health care provider. Document Revised: 04/09/2018 Document Reviewed: 04/09/2018 Elsevier Patient Education  2020 Elsevier Inc.  

## 2019-10-23 NOTE — Progress Notes (Signed)
[place], [place]  [address] Phone:  [phone]   Fax:  [phone]   Established Patient Office Visit  Subjective:  Patient ID: [name], male    DOB: 1981/07/16  Age: 39 y.o. MRN: [id number]  CC:  Chief Complaint  Patient presents with  . Sickle Cell Anemia  . Medication Refill    percocet     HPI [name] presents for follow-up.  He  has a past medical history of Sickle cell anemia ([place]).  He is currently on folic acid, hydroxyurea, oxycodone/acetaminophen.  He admits that he stays hydrated with water and eats a well-balanced diet.  He makes adjustments to his diet if he experiences constipation.  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  He feels like he is doing well overall.  He denies any concerns today. He denies fever, headache, cough, wheezing, shortness of breath, chest pains, abdominal pain, back pain, hip pain, or leg pain.   Past Medical History:  Diagnosis Date  . Sickle cell anemia (HCC)     Past Surgical History:  Procedure Laterality Date  . CHOLECYSTECTOMY      Family History  Problem Relation Age of Onset  . Sickle cell anemia Mother   . Sickle cell trait Father   . Sickle cell trait Sister   . Sickle cell trait Brother   . Cancer Maternal Grandmother     Social History   Socioeconomic History  . Marital status: Single    Spouse name: Not on file  . Number of children: Not on file  . Years of education: Not on file  . Highest education level: Not on file  Occupational History  . Not on file  Tobacco Use  . Smoking status: Never Smoker  . Smokeless tobacco: Never Used  Substance and Sexual Activity  . Alcohol use: No  . Drug use: No  . Sexual activity: Yes  Other Topics Concern  . Not on file  Social History Narrative  . Not on file   Social Determinants of Health   Financial Resource Strain:   . Difficulty of Paying Living Expenses: Not on file  Food Insecurity:   .  Worried About Charity fundraiser in the Last Year: Not on file  . Ran Out of Food in the Last Year: Not on file  Transportation Needs:   . Lack of Transportation (Medical): Not on file  . Lack of Transportation (Non-Medical): Not on file  Physical Activity:   . Days of Exercise per Week: Not on file  . Minutes of Exercise per Session: Not on file  Stress:   . Feeling of Stress : Not on file  Social Connections:   . Frequency of Communication with Friends and Family: Not on file  . Frequency of Social Gatherings with Friends and Family: Not on file  . Attends Religious Services: Not on file  . Active Member of Clubs or Organizations: Not on file  . Attends Archivist Meetings: Not on file  . Marital Status: Not on file  Intimate Partner Violence:   . Fear of Current or Ex-Partner: Not on file  . Emotionally Abused: Not on file  . Physically Abused: Not on file  . Sexually Abused: Not on file    Outpatient Medications Prior to Visit  Medication Sig Dispense Refill  . folic acid (FOLVITE) A999333 MCG tablet Take 400 mcg by mouth daily.    . fluticasone (FLONASE)  50 MCG/ACT nasal spray SPRAY 2 SPRAYS INTO EACH NOSTRIL EVERY DAY (Patient not taking: Reported on 07/26/2019) 48 mL 2  . hydroxyurea (HYDREA) 500 MG capsule TAKE 2 CAPSULES BY MOUTH TWICE A DAY MAY TAKE WITH FOOD TO MINIMIZE GI SIDE EFFECTS 360 capsule 1  . levocetirizine (XYZAL) 5 MG tablet TAKE 1 TABLET BY MOUTH EVERY DAY IN THE EVENING (Patient not taking: Reported on 07/26/2019) 90 tablet 1  . naproxen (NAPROSYN) 500 MG tablet Take 1 tablet (500 mg total) by mouth 2 (two) times daily with a meal. (Patient not taking: Reported on 10/23/2019) 30 tablet 0   No facility-administered medications prior to visit.    No Known Allergies  ROS Review of Systems  All other systems reviewed and are negative.     Objective:    Physical Exam  Constitutional: He is oriented to person, place, and time. He appears  well-developed and well-nourished.  HENT:  Head: Normocephalic.  Cardiovascular: Normal rate, regular rhythm and normal heart sounds.  Pulmonary/Chest: Effort normal and breath sounds normal.  Abdominal: Soft. Bowel sounds are normal.  Musculoskeletal:        General: Normal range of motion.     Cervical back: Normal range of motion.  Neurological: He is alert and oriented to person, place, and time.  Skin: Skin is warm and dry.  Psychiatric: He has a normal mood and affect. His behavior is normal. Judgment and thought content normal.    BP (!) 101/59 (BP Location: Left Arm, Patient Position: Sitting, Cuff Size: Normal)   Pulse 70   Temp 98 F (36.7 C) (Oral)   Resp 14   Ht 6\' 1"  (1.854 m)   Wt 153 lb (69.4 kg)   SpO2 97%   BMI 20.19 kg/m  Wt Readings from Last 3 Encounters:  10/23/19 153 lb (69.4 kg)  07/26/19 162 lb (73.5 kg)  09/14/18 153 lb (69.4 kg)     There are no preventive care reminders to display for this patient.  There are no preventive care reminders to display for this patient.  Lab Results  Component Value Date   TSH 1.122 09/05/2010   Lab Results  Component Value Date   WBC 4.8 09/13/2018   HGB 10.5 (L) 09/13/2018   HCT 29.5 (L) 09/13/2018   MCV 95.5 09/13/2018   PLT 774 (H) 09/13/2018   Lab Results  Component Value Date   NA 133 (L) 09/13/2018   K 4.2 09/13/2018   CO2 25 09/13/2018   GLUCOSE 107 (H) 09/13/2018   BUN 10 09/13/2018   CREATININE 0.89 09/13/2018   BILITOT 2.1 (H) 09/13/2018   ALKPHOS 50 09/13/2018   AST 35 09/13/2018   ALT 29 09/13/2018   PROT 8.8 (H) 09/13/2018   ALBUMIN 4.7 09/13/2018   CALCIUM 9.0 09/13/2018   ANIONGAP 8 09/13/2018   No results found for: CHOL No results found for: HDL No results found for: LDLCALC No results found for: TRIG No results found for: CHOLHDL No results found for: HGBA1C    Assessment & Plan:   Problem List Items Addressed This Visit      Unprioritized   Hb-SS disease without  crisis ([place]) - Primary   Relevant Medications   oxyCODONE-acetaminophen (PERCOCET) 10-325 MG tablet   Other Relevant Orders   Urinalysis Dipstick (Completed)   CBC with Differential/Platelet   Comp. Metabolic Panel (12)   Reticulocytes   Ferritin    Other Visit Diagnoses    Vitamin D deficiency  Encourage patient to take daily vitamin D   Chronic prescription opiate use       Screening pending   Relevant Medications   oxyCODONE-acetaminophen (PERCOCET) 10-325 MG tablet   Other Relevant Orders   [id number]+Oxyco+Alc+Crt-Bund      Meds ordered this encounter  Medications  . oxyCODONE-acetaminophen (PERCOCET) 10-325 MG tablet    Sig: Take 1 tablet by mouth every 4 (four) hours as needed for up to 15 days for pain.    Dispense:  90 tablet    Refill:  0    Order Specific Question:   Supervising Provider    Answer:   Tresa Garter [id number]    Follow-up: Return in about 2 months (around 12/23/2019).    Vevelyn Francois, NP

## 2019-10-24 LAB — COMP. METABOLIC PANEL (12)
AST: 29 IU/L (ref 0–40)
Albumin/Globulin Ratio: 1.4 (ref 1.2–2.2)
Albumin: 4.6 g/dL (ref 4.0–5.0)
Alkaline Phosphatase: 66 IU/L (ref 39–117)
BUN/Creatinine Ratio: 8 — ABNORMAL LOW (ref 9–20)
BUN: 6 mg/dL (ref 6–20)
Bilirubin Total: 2.6 mg/dL — ABNORMAL HIGH (ref 0.0–1.2)
Calcium: 9.4 mg/dL (ref 8.7–10.2)
Chloride: 102 mmol/L (ref 96–106)
Creatinine, Ser: 0.76 mg/dL (ref 0.76–1.27)
GFR calc Af Amer: 135 mL/min/{1.73_m2} (ref >59)
GFR calc non Af Amer: 117 mL/min/{1.73_m2} (ref >59)
Globulin, Total: 3.3 g/dL (ref 1.5–4.5)
Glucose: 101 mg/dL — ABNORMAL HIGH (ref 65–99)
Potassium: 4.3 mmol/L (ref 3.5–5.2)
Sodium: 138 mmol/L (ref 134–144)
Total Protein: 7.9 g/dL (ref 6.0–8.5)

## 2019-10-24 LAB — CBC WITH DIFFERENTIAL/PLATELET
Basophils Absolute: 0.1 10*3/uL (ref 0.0–0.2)
Basos: 1 %
EOS (ABSOLUTE): 0.2 10*3/uL (ref 0.0–0.4)
Eos: 3 %
Hematocrit: 27.1 % — ABNORMAL LOW (ref 37.5–51.0)
Hemoglobin: 9.3 g/dL — ABNORMAL LOW (ref 13.0–17.7)
Immature Grans (Abs): 0 10*3/uL (ref 0.0–0.1)
Immature Granulocytes: 0 %
Lymphocytes Absolute: 2.2 10*3/uL (ref 0.7–3.1)
Lymphs: 30 %
MCH: 32 pg (ref 26.6–33.0)
MCHC: 34.3 g/dL (ref 31.5–35.7)
MCV: 93 fL (ref 79–97)
Monocytes Absolute: 0.4 10*3/uL (ref 0.1–0.9)
Monocytes: 6 %
NRBC: 3 % — ABNORMAL HIGH (ref 0–0)
Neutrophils Absolute: 4.3 10*3/uL (ref 1.4–7.0)
Neutrophils: 60 %
Platelets: 429 10*3/uL (ref 150–450)
RBC: 2.91 x10E6/uL — ABNORMAL LOW (ref 4.14–5.80)
RDW: 18.6 % — ABNORMAL HIGH (ref 11.6–15.4)
WBC: 7.2 10*3/uL (ref 3.4–10.8)

## 2019-10-24 LAB — FERRITIN: Ferritin: 236 ng/mL (ref 30–400)

## 2019-10-24 LAB — RETICULOCYTES: Retic Ct Pct: 7.9 % — ABNORMAL HIGH (ref 0.6–2.6)

## 2019-10-28 LAB — DRUG SCREEN 764883 11+OXYCO+ALC+CRT-BUND
Amphetamines, Urine: NEGATIVE ng/mL
BENZODIAZ UR QL: NEGATIVE ng/mL
Barbiturate: NEGATIVE ng/mL
Cannabinoid Quant, Ur: NEGATIVE ng/mL
Cocaine (Metabolite): NEGATIVE ng/mL
Creatinine: 144.8 mg/dL (ref 20.0–300.0)
Ethanol: NEGATIVE %
Meperidine: NEGATIVE ng/mL
Methadone Screen, Urine: NEGATIVE ng/mL
OPIATE SCREEN URINE: NEGATIVE ng/mL
Phencyclidine: NEGATIVE ng/mL
Propoxyphene: NEGATIVE ng/mL
Tramadol: NEGATIVE ng/mL
pH, Urine: 5.5 (ref 4.5–8.9)

## 2019-10-28 LAB — OXYCODONE/OXYMORPHONE, CONFIRM
OXYCODONE/OXYMORPH: POSITIVE — AB
OXYCODONE: 1236 ng/mL
OXYCODONE: POSITIVE — AB
OXYMORPHONE (GC/MS): 1497 ng/mL
OXYMORPHONE: POSITIVE — AB

## 2019-11-25 ENCOUNTER — Telehealth: Payer: Self-pay | Admitting: Nurse Practitioner

## 2019-11-25 NOTE — Telephone Encounter (Signed)
Pt called in refill on percocet 

## 2019-11-27 ENCOUNTER — Other Ambulatory Visit: Payer: Self-pay | Admitting: Nurse Practitioner

## 2019-11-27 DIAGNOSIS — D571 Sickle-cell disease without crisis: Secondary | ICD-10-CM

## 2019-11-27 MED ORDER — OXYCODONE-ACETAMINOPHEN 10-325 MG PO TABS: 1.0000 | ORAL_TABLET | ORAL | 0 refills | Status: DC | PRN

## 2019-11-27 NOTE — Telephone Encounter (Signed)
Sent!

## 2019-12-17 ENCOUNTER — Telehealth: Payer: Self-pay | Admitting: Nurse Practitioner

## 2019-12-17 NOTE — Telephone Encounter (Signed)
Pt wants refill on percocet

## 2019-12-18 ENCOUNTER — Other Ambulatory Visit: Payer: Self-pay | Admitting: Nurse Practitioner

## 2019-12-18 DIAGNOSIS — D571 Sickle-cell disease without crisis: Secondary | ICD-10-CM

## 2019-12-18 MED ORDER — OXYCODONE-ACETAMINOPHEN 10-325 MG PO TABS: 1.0000 | ORAL_TABLET | ORAL | 0 refills | Status: DC | PRN

## 2019-12-23 ENCOUNTER — Ambulatory Visit: Payer: Self-pay | Admitting: Nurse Practitioner

## 2020-01-08 ENCOUNTER — Telehealth: Payer: Self-pay | Admitting: Nurse Practitioner

## 2020-01-08 ENCOUNTER — Other Ambulatory Visit: Payer: Self-pay | Admitting: Nurse Practitioner

## 2020-01-08 DIAGNOSIS — D571 Sickle-cell disease without crisis: Secondary | ICD-10-CM

## 2020-01-08 DIAGNOSIS — G894 Chronic pain syndrome: Secondary | ICD-10-CM

## 2020-01-08 MED ORDER — OXYCODONE-ACETAMINOPHEN 10-325 MG PO TABS: 1.0000 | ORAL_TABLET | ORAL | 0 refills | Status: DC | PRN

## 2020-01-08 NOTE — Telephone Encounter (Signed)
Sent!

## 2020-01-08 NOTE — Progress Notes (Signed)
PDMP reviewed during this encounter.  

## 2020-01-10 DIAGNOSIS — R17 Unspecified jaundice: Secondary | ICD-10-CM | POA: Diagnosis not present

## 2020-01-10 DIAGNOSIS — R079 Chest pain, unspecified: Secondary | ICD-10-CM | POA: Diagnosis not present

## 2020-01-10 DIAGNOSIS — R9431 Abnormal electrocardiogram [ECG] [EKG]: Secondary | ICD-10-CM | POA: Diagnosis not present

## 2020-01-10 DIAGNOSIS — R0789 Other chest pain: Secondary | ICD-10-CM | POA: Diagnosis not present

## 2020-01-10 DIAGNOSIS — D5700 Hb-SS disease with crisis, unspecified: Secondary | ICD-10-CM | POA: Diagnosis not present

## 2020-01-16 ENCOUNTER — Other Ambulatory Visit: Payer: Self-pay

## 2020-01-16 MED ORDER — HYDROXYUREA 500 MG PO CAPS: ORAL_CAPSULE | ORAL | 1 refills | Status: DC

## 2020-01-16 NOTE — Telephone Encounter (Signed)
Is this okay to refill? 

## 2020-02-10 ENCOUNTER — Telehealth: Payer: Self-pay | Admitting: Nurse Practitioner

## 2020-02-10 ENCOUNTER — Other Ambulatory Visit: Payer: Self-pay | Admitting: Family Medicine

## 2020-02-10 DIAGNOSIS — D571 Sickle-cell disease without crisis: Secondary | ICD-10-CM

## 2020-02-10 MED ORDER — OXYCODONE-ACETAMINOPHEN 10-325 MG PO TABS: 1.0000 | ORAL_TABLET | ORAL | 0 refills | Status: DC | PRN

## 2020-02-10 NOTE — Telephone Encounter (Signed)
Done

## 2020-03-25 ENCOUNTER — Telehealth: Payer: Self-pay | Admitting: Family Medicine

## 2020-03-26 ENCOUNTER — Other Ambulatory Visit: Payer: Self-pay | Admitting: Family Medicine

## 2020-03-26 DIAGNOSIS — D571 Sickle-cell disease without crisis: Secondary | ICD-10-CM

## 2020-03-26 MED ORDER — OXYCODONE-ACETAMINOPHEN 10-325 MG PO TABS: 1.0000 | ORAL_TABLET | ORAL | 0 refills | Status: DC | PRN

## 2020-03-27 NOTE — Telephone Encounter (Signed)
Done

## 2020-04-17 ENCOUNTER — Telehealth: Payer: Self-pay | Admitting: Nurse Practitioner

## 2020-04-17 ENCOUNTER — Other Ambulatory Visit: Payer: Self-pay

## 2020-04-17 ENCOUNTER — Encounter: Payer: Self-pay | Admitting: Nurse Practitioner

## 2020-04-17 ENCOUNTER — Other Ambulatory Visit: Payer: Self-pay | Admitting: Nurse Practitioner

## 2020-04-17 ENCOUNTER — Ambulatory Visit (INDEPENDENT_AMBULATORY_CARE_PROVIDER_SITE_OTHER): Payer: Medicare Other | Admitting: Nurse Practitioner

## 2020-04-17 VITALS — BP 105/52 | HR 60 | Temp 97.9°F | Ht 73.0 in | Wt 152.2 lb

## 2020-04-17 DIAGNOSIS — E559 Vitamin D deficiency, unspecified: Secondary | ICD-10-CM

## 2020-04-17 DIAGNOSIS — D571 Sickle-cell disease without crisis: Secondary | ICD-10-CM | POA: Diagnosis not present

## 2020-04-17 DIAGNOSIS — G894 Chronic pain syndrome: Secondary | ICD-10-CM | POA: Diagnosis not present

## 2020-04-17 DIAGNOSIS — M25571 Pain in right ankle and joints of right foot: Secondary | ICD-10-CM

## 2020-04-17 MED ORDER — OXYCODONE-ACETAMINOPHEN 10-325 MG PO TABS: 1.0000 | ORAL_TABLET | ORAL | 0 refills | Status: DC | PRN

## 2020-04-17 NOTE — Progress Notes (Signed)
[place], [place]  [address] Phone:  [phone]   Fax:  [phone]   Established Patient Office Visit  Subjective:  Patient ID: [name], male    DOB: Mar 15, 1981  Age: 39 y.o. MRN: [id number]  CC:  Chief Complaint  Patient presents with  . Follow-up    HPI [name] presents for follow up. He  has a past medical history of Sickle cell anemia ([place]).   He is follow up 2/10 pain. He has left knee pain and right ankle pain. He sprained his right ankle a few days ago during a photo shot. He has been using ice, elevation and compression , ROM exercises along with his medication. He feels like it continues to improve.He is doing well.  He denies fever, headache, cough, wheezing, shortness of breath, chest pains, abdominal pain, back pain or hip pain. Denies any open wounds, skin irritation.    Past Medical History:  Diagnosis Date  . Sickle cell anemia (HCC)     Past Surgical History:  Procedure Laterality Date  . CHOLECYSTECTOMY      Family History  Problem Relation Age of Onset  . Sickle cell anemia Mother   . Sickle cell trait Father   . Sickle cell trait Sister   . Sickle cell trait Brother   . Cancer Maternal Grandmother     Social History   Socioeconomic History  . Marital status: Single    Spouse name: Not on file  . Number of children: Not on file  . Years of education: Not on file  . Highest education level: Not on file  Occupational History  . Not on file  Tobacco Use  . Smoking status: Never Smoker  . Smokeless tobacco: Never Used  Vaping Use  . Vaping Use: Never used  Substance and Sexual Activity  . Alcohol use: No  . Drug use: No  . Sexual activity: Yes  Other Topics Concern  . Not on file  Social History Narrative  . Not on file   Social Determinants of Health   Financial Resource Strain:   . Difficulty of Paying Living Expenses: Not on file  Food Insecurity:   . Worried About  Charity fundraiser in the Last Year: Not on file  . Ran Out of Food in the Last Year: Not on file  Transportation Needs:   . Lack of Transportation (Medical): Not on file  . Lack of Transportation (Non-Medical): Not on file  Physical Activity:   . Days of Exercise per Week: Not on file  . Minutes of Exercise per Session: Not on file  Stress:   . Feeling of Stress : Not on file  Social Connections:   . Frequency of Communication with Friends and Family: Not on file  . Frequency of Social Gatherings with Friends and Family: Not on file  . Attends Religious Services: Not on file  . Active Member of Clubs or Organizations: Not on file  . Attends Archivist Meetings: Not on file  . Marital Status: Not on file  Intimate Partner Violence:   . Fear of Current or Ex-Partner: Not on file  . Emotionally Abused: Not on file  . Physically Abused: Not on file  . Sexually Abused: Not on file    Outpatient Medications Prior to Visit  Medication Sig Dispense Refill  . folic acid (FOLVITE) 644 MCG tablet Take 400 mcg by mouth daily.    . hydroxyurea (  HYDREA) 500 MG capsule May take with food to minimize GI side effects. 360 capsule 1  . oxyCODONE-acetaminophen (PERCOCET) 10-325 MG tablet Take 1 tablet by mouth every 4 (four) hours as needed for up to 15 days for pain. 90 tablet 0  . naproxen (NAPROSYN) 500 MG tablet Take 1 tablet (500 mg total) by mouth 2 (two) times daily with a meal. (Patient not taking: Reported on 10/23/2019) 30 tablet 0  . fluticasone (FLONASE) 50 MCG/ACT nasal spray SPRAY 2 SPRAYS INTO EACH NOSTRIL EVERY DAY (Patient not taking: Reported on 07/26/2019) 48 mL 2  . levocetirizine (XYZAL) 5 MG tablet TAKE 1 TABLET BY MOUTH EVERY DAY IN THE EVENING (Patient not taking: Reported on 07/26/2019) 90 tablet 1   No facility-administered medications prior to visit.    No Known Allergies  ROS Review of Systems  All other systems reviewed and are negative.     Objective:     Physical Exam Constitutional:      Appearance: He is normal weight.  HENT:     Head: Normocephalic and atraumatic.     Nose: Nose normal.     Mouth/Throat:     Mouth: Mucous membranes are moist.  Cardiovascular:     Rate and Rhythm: Normal rate and regular rhythm.     Pulses: Normal pulses.     Heart sounds: Normal heart sounds.  Pulmonary:     Effort: Pulmonary effort is normal.     Breath sounds: Normal breath sounds.  Abdominal:     Palpations: Abdomen is soft.  Musculoskeletal:        General: Normal range of motion.     Cervical back: Normal range of motion.  Skin:    General: Skin is warm and dry.     Capillary Refill: Capillary refill takes less than 2 seconds.     Comments: Scab to left knee   Neurological:     General: No focal deficit present.     Mental Status: He is alert.  Psychiatric:        Mood and Affect: Mood normal.        Behavior: Behavior normal.        Thought Content: Thought content normal.     BP (!) 105/52   Pulse 60   Temp 97.9 F (36.6 C)   Ht 6\' 1"  (1.854 m)   Wt 152 lb 3.2 oz (69 kg)   SpO2 100%   BMI 20.08 kg/m  Wt Readings from Last 3 Encounters:  04/17/20 152 lb 3.2 oz (69 kg)  10/23/19 153 lb (69.4 kg)  07/26/19 162 lb (73.5 kg)     There are no preventive care reminders to display for this patient.  There are no preventive care reminders to display for this patient.  Lab Results  Component Value Date   TSH 1.122 09/05/2010   Lab Results  Component Value Date   WBC 7.2 04/17/2020   HGB 9.4 (L) 04/17/2020   HCT 28.3 (L) 04/17/2020   MCV 94 04/17/2020   PLT 534 (H) 04/17/2020   Lab Results  Component Value Date   NA 136 04/17/2020   K 4.4 04/17/2020   CO2 23 04/17/2020   GLUCOSE 88 04/17/2020   BUN 6 04/17/2020   CREATININE 0.75 (L) 04/17/2020   BILITOT 2.5 (H) 04/17/2020   ALKPHOS 76 04/17/2020   AST 28 04/17/2020   ALT 23 04/17/2020   PROT 8.2 04/17/2020   ALBUMIN 4.6 04/17/2020   CALCIUM 9.5  04/17/2020   ANIONGAP 8 09/13/2018   No results found for: CHOL No results found for: HDL No results found for: LDLCALC No results found for: TRIG No results found for: CHOLHDL No results found for: HGBA1C    Assessment & Plan:   Problem List Items Addressed This Visit      Other   Hb-SS disease without crisis ([place]) - Primary We discussed the need for good hydration, monitoring of hydration status, avoidance of heat, cold, stress, and infection triggers. We discussed the risks and benefits of Hydrea, including bone marrow suppression, the possibility of GI upset, skin ulcers, hair thinning, and teratogenicity. The patient was reminded of the need to seek medical attention of any symptoms of bleeding, anemia, or infection. Continue folic acid 1 mg daily to prevent aplastic bone marrow crises.    Relevant Orders   [id number] 11+Oxyco+Alc+Crt-Bund   Sickle Cell Panel   POCT URINALYSIS DIP (CLINITEK)   Chronic pain syndrome    Other Visit Diagnoses    Vitamin D deficiency       Acute right ankle pain     Continue with current treatment if symptoms persist fu for further evaluation      No orders of the defined types were placed in this encounter.   Follow-up: Return in about 3 months (around 07/17/2020).    Vevelyn Francois, NP

## 2020-04-17 NOTE — Patient Instructions (Signed)
Ankle Sprain  An ankle sprain is a stretch or tear in one of the tough tissues (ligaments) that connect the bones in your ankle. An ankle sprain can happen when the ankle rolls outward (inversion sprain) or inward (eversion sprain). What are the causes? This condition is caused by rolling or twisting the ankle. What increases the risk? You are more likely to develop this condition if you play sports. What are the signs or symptoms? Symptoms of this condition include:  Pain in your ankle.  Swelling.  Bruising. This may happen right after you sprain your ankle or 1-2 days later.  Trouble standing or walking. How is this diagnosed? This condition is diagnosed with:  A physical exam. During the exam, your doctor will press on certain parts of your foot and ankle and try to move them in certain ways.  X-ray imaging. These may be taken to see how bad the sprain is and to check for broken bones. How is this treated? This condition may be treated with:  A brace or splint. This is used to keep the ankle from moving until it heals.  An elastic bandage. This is used to support the ankle.  Crutches.  Pain medicine.  Surgery. This may be needed if the sprain is very bad.  Physical therapy. This may help to improve movement in the ankle. Follow these instructions at home: If you have a brace or a splint:  Wear the brace or splint as told by your doctor. Remove it only as told by your doctor.  Loosen the brace or splint if your toes: ? Tingle. ? Lose feeling (become numb). ? Turn cold and blue.  Keep the brace or splint clean.  If the brace or splint is not waterproof: ? Do not let it get wet. ? Cover it with a watertight covering when you take a bath or a shower. If you have an elastic bandage (dressing):  Remove it to shower or bathe.  Try not to move your ankle much, but wiggle your toes from time to time. This helps to prevent swelling.  Adjust the dressing if it feels  too tight.  Loosen the dressing if your foot: ? Loses feeling. ? Tingles. ? Becomes cold and blue. Managing pain, stiffness, and swelling   Take over-the-counter and prescription medicines only as told by doctor.  For 2-3 days, keep your ankle raised (elevated) above the level of your heart.  If told, put ice on the injured area: ? If you have a removable brace or splint, remove it as told by your doctor. ? Put ice in a plastic bag. ? Place a towel between your skin and the bag. ? Leave the ice on for 20 minutes, 2-3 times a day. General instructions  Rest your ankle.  Do not use your injured leg to support your body weight until your doctor says that you can. Use crutches as told by your doctor.  Do not use any products that contain nicotine or tobacco, such as cigarettes, e-cigarettes, and chewing tobacco. If you need help quitting, ask your doctor.  Keep all follow-up visits as told by your doctor. Contact a doctor if:  Your bruises or swelling are quickly getting worse.  Your pain does not get better after you take medicine. Get help right away if:  You cannot feel your toes or foot.  Your foot or toes look blue.  You have very bad pain that gets worse. Summary  An ankle sprain is a stretch   or tear in one of the tough tissues (ligaments) that connect the bones in your ankle.  This condition is caused by rolling or twisting the ankle.  Symptoms include pain, swelling, bruising, and trouble walking.  To help with pain and swelling, put ice on the injured ankle, raise your ankle above the level of your heart, and use an elastic bandage. Also, rest as told by your doctor.  Keep all follow-up visits as told by your doctor. This is important. This information is not intended to replace advice given to you by your health care provider. Make sure you discuss any questions you have with your health care provider. Document Revised: 12/26/2017 Document Reviewed:  12/26/2017 Elsevier Patient Education  2020 Elsevier Inc.  

## 2020-04-17 NOTE — Progress Notes (Signed)
° °  [facility] [place], [place]  [address] Phone:  [phone]   Fax:  [phone] PDMP reviewed during this encounter.Subjective:    [name] calls for refill of his oxycodone/acetaminophen 10/325 mg.    Objective:   Indication for chronic opioid: Chronic pain related to sickle cell disease Medication and dose:Oxycodone/acetaminophen 10/325 mg # pills per month:  #180 give or take Last UDS date: 10/23/2019 Opioid Treatment Agreement signed (Y/N): Yes Opioid Treatment Agreement last reviewed with patient:  2021 [place] reviewed this encounter (include red flags): Yes reviewed no red flags   Assessment:   Chronic Pain Syndrome related to Sickle Cell Disease    Plan:   Oxycodone/acetaminophen 10/325 mg #90 Return for follow-up appointment as scheduled.

## 2020-04-18 LAB — CMP14+CBC/D/PLT+FER+RETIC+V...
ALT: 23 IU/L (ref 0–44)
AST: 28 IU/L (ref 0–40)
Albumin/Globulin Ratio: 1.3 (ref 1.2–2.2)
Albumin: 4.6 g/dL (ref 4.0–5.0)
Alkaline Phosphatase: 76 IU/L (ref 48–121)
BUN/Creatinine Ratio: 8 — ABNORMAL LOW (ref 9–20)
BUN: 6 mg/dL (ref 6–20)
Basophils Absolute: 0.1 10*3/uL (ref 0.0–0.2)
Basos: 1 %
Bilirubin Total: 2.5 mg/dL — ABNORMAL HIGH (ref 0.0–1.2)
CO2: 23 mmol/L (ref 20–29)
Calcium: 9.5 mg/dL (ref 8.7–10.2)
Chloride: 101 mmol/L (ref 96–106)
Creatinine, Ser: 0.75 mg/dL — ABNORMAL LOW (ref 0.76–1.27)
EOS (ABSOLUTE): 0.2 10*3/uL (ref 0.0–0.4)
Eos: 3 %
Ferritin: 225 ng/mL (ref 30–400)
GFR calc Af Amer: 135 mL/min/{1.73_m2} (ref >59)
GFR calc non Af Amer: 117 mL/min/{1.73_m2} (ref >59)
Globulin, Total: 3.6 g/dL (ref 1.5–4.5)
Glucose: 88 mg/dL (ref 65–99)
Hematocrit: 28.3 % — ABNORMAL LOW (ref 37.5–51.0)
Hemoglobin: 9.4 g/dL — ABNORMAL LOW (ref 13.0–17.7)
Immature Grans (Abs): 0 10*3/uL (ref 0.0–0.1)
Immature Granulocytes: 0 %
Lymphocytes Absolute: 3.2 10*3/uL — ABNORMAL HIGH (ref 0.7–3.1)
Lymphs: 44 %
MCH: 31.1 pg (ref 26.6–33.0)
MCHC: 33.2 g/dL (ref 31.5–35.7)
MCV: 94 fL (ref 79–97)
Monocytes Absolute: 0.5 10*3/uL (ref 0.1–0.9)
Monocytes: 7 %
NRBC: 1 % — ABNORMAL HIGH (ref 0–0)
Neutrophils Absolute: 3.2 10*3/uL (ref 1.4–7.0)
Neutrophils: 45 %
Platelets: 534 10*3/uL — ABNORMAL HIGH (ref 150–450)
Potassium: 4.4 mmol/L (ref 3.5–5.2)
RBC: 3.02 x10E6/uL — ABNORMAL LOW (ref 4.14–5.80)
RDW: 18.8 % — ABNORMAL HIGH (ref 11.6–15.4)
Retic Ct Pct: 5.2 % — ABNORMAL HIGH (ref 0.6–2.6)
Sodium: 136 mmol/L (ref 134–144)
Total Protein: 8.2 g/dL (ref 6.0–8.5)
Vit D, 25-Hydroxy: 14.5 ng/mL — ABNORMAL LOW (ref 30.0–100.0)
WBC: 7.2 10*3/uL (ref 3.4–10.8)

## 2020-04-21 LAB — DRUG SCREEN 764883 11+OXYCO+ALC+CRT-BUND
Amphetamines, Urine: NEGATIVE ng/mL
BENZODIAZ UR QL: NEGATIVE ng/mL
Barbiturate: NEGATIVE ng/mL
Cannabinoid Quant, Ur: NEGATIVE ng/mL
Cocaine (Metabolite): NEGATIVE ng/mL
Creatinine: 129.1 mg/dL (ref 20.0–300.0)
Ethanol: NEGATIVE %
Meperidine: NEGATIVE ng/mL
Methadone Screen, Urine: NEGATIVE ng/mL
OPIATE SCREEN URINE: NEGATIVE ng/mL
Phencyclidine: NEGATIVE ng/mL
Propoxyphene: NEGATIVE ng/mL
Tramadol: NEGATIVE ng/mL
pH, Urine: 5.8 (ref 4.5–8.9)

## 2020-04-21 LAB — OXYCODONE/OXYMORPHONE, CONFIRM
OXYCODONE/OXYMORPH: POSITIVE — AB
OXYCODONE: 1293 ng/mL
OXYCODONE: POSITIVE — AB
OXYMORPHONE (GC/MS): 2660 ng/mL
OXYMORPHONE: POSITIVE — AB

## 2020-04-23 NOTE — Telephone Encounter (Signed)
Sent to NP 

## 2020-04-30 ENCOUNTER — Telehealth: Payer: Self-pay | Admitting: Nurse Practitioner

## 2020-04-30 NOTE — Telephone Encounter (Signed)
Patient requesting refill on Percocet. Left VM

## 2020-05-01 ENCOUNTER — Telehealth: Payer: Self-pay | Admitting: Nurse Practitioner

## 2020-05-01 ENCOUNTER — Other Ambulatory Visit: Payer: Self-pay | Admitting: Nurse Practitioner

## 2020-05-01 DIAGNOSIS — D571 Sickle-cell disease without crisis: Secondary | ICD-10-CM

## 2020-05-01 MED ORDER — OXYCODONE-ACETAMINOPHEN 10-325 MG PO TABS: 1.0000 | ORAL_TABLET | ORAL | 0 refills | Status: DC | PRN

## 2020-05-01 NOTE — Telephone Encounter (Signed)
Refill sent due date 05/02/2020

## 2020-05-01 NOTE — Progress Notes (Signed)
° °  [facility] [place], [place]  [address] Phone:  [phone]   Fax:  [phone] PDMP not reviewed this encounter.Subjective:    [name] calls for refill of his Percocet   Objective:   Indication for chronic opioid: Chronic pain related to sickle cell disease Medication and dose: *Percocet 10/325 every 4 hours as needed # pills per month: 180 Last UDS date: 04/17/2020 Opioid Treatment Agreement signed (Y/N): Yes Opioid Treatment Agreement last reviewed with patient:  2021 [place] reviewed this encounter (include red flags):  Reviewed no red flags   Assessment:   Chronic Pain Syndrome related to Sickle Cell Disease    Plan:   Percocet 10/325 mg  Refill sent to [facility] Return for follow-up appointment as scheduled.

## 2020-05-04 NOTE — Telephone Encounter (Signed)
Sent to provider 

## 2020-05-05 ENCOUNTER — Telehealth: Payer: Self-pay | Admitting: Nurse Practitioner

## 2020-05-05 NOTE — Telephone Encounter (Signed)
Done

## 2020-05-22 ENCOUNTER — Telehealth: Payer: Self-pay | Admitting: Nurse Practitioner

## 2020-05-25 ENCOUNTER — Other Ambulatory Visit: Payer: Self-pay | Admitting: Nurse Practitioner

## 2020-05-25 DIAGNOSIS — D571 Sickle-cell disease without crisis: Secondary | ICD-10-CM

## 2020-05-25 MED ORDER — OXYCODONE-ACETAMINOPHEN 10-325 MG PO TABS: 1.0000 | ORAL_TABLET | ORAL | 0 refills | Status: AC | PRN

## 2020-05-25 NOTE — Telephone Encounter (Signed)
Sent to provider 

## 2020-05-25 NOTE — Telephone Encounter (Signed)
Sent!

## 2020-06-23 ENCOUNTER — Telehealth: Payer: Self-pay | Admitting: Nurse Practitioner

## 2020-06-24 ENCOUNTER — Other Ambulatory Visit: Payer: Self-pay | Admitting: Nurse Practitioner

## 2020-06-24 DIAGNOSIS — Z79891 Long term (current) use of opiate analgesic: Secondary | ICD-10-CM

## 2020-06-24 DIAGNOSIS — D571 Sickle-cell disease without crisis: Secondary | ICD-10-CM

## 2020-06-24 MED ORDER — OXYCODONE-ACETAMINOPHEN 10-325 MG PO TABS: 1.0000 | ORAL_TABLET | ORAL | 0 refills | Status: DC | PRN

## 2020-06-24 NOTE — Telephone Encounter (Signed)
Sent!

## 2020-07-13 ENCOUNTER — Telehealth: Payer: Self-pay | Admitting: Nurse Practitioner

## 2020-07-13 ENCOUNTER — Other Ambulatory Visit: Payer: Self-pay | Admitting: Nurse Practitioner

## 2020-07-13 DIAGNOSIS — D571 Sickle-cell disease without crisis: Secondary | ICD-10-CM

## 2020-07-13 DIAGNOSIS — Z79891 Long term (current) use of opiate analgesic: Secondary | ICD-10-CM

## 2020-07-13 MED ORDER — OXYCODONE-ACETAMINOPHEN 10-325 MG PO TABS: 1.0000 | ORAL_TABLET | ORAL | 0 refills | Status: DC | PRN

## 2020-07-13 NOTE — Telephone Encounter (Signed)
sent 

## 2020-07-17 ENCOUNTER — Encounter: Payer: Self-pay | Admitting: Nurse Practitioner

## 2020-07-17 ENCOUNTER — Other Ambulatory Visit: Payer: Self-pay

## 2020-07-17 ENCOUNTER — Ambulatory Visit (INDEPENDENT_AMBULATORY_CARE_PROVIDER_SITE_OTHER): Payer: Medicare Other | Admitting: Nurse Practitioner

## 2020-07-17 VITALS — BP 113/62 | HR 66 | Temp 98.4°F | Resp 16 | Ht 73.0 in | Wt 154.6 lb

## 2020-07-17 DIAGNOSIS — G894 Chronic pain syndrome: Secondary | ICD-10-CM

## 2020-07-17 DIAGNOSIS — Z79891 Long term (current) use of opiate analgesic: Secondary | ICD-10-CM

## 2020-07-17 DIAGNOSIS — D571 Sickle-cell disease without crisis: Secondary | ICD-10-CM

## 2020-07-17 LAB — POCT URINALYSIS DIP (CLINITEK)
Bilirubin, UA: NEGATIVE
Blood, UA: NEGATIVE
Glucose, UA: NEGATIVE mg/dL
Ketones, POC UA: NEGATIVE mg/dL
Nitrite, UA: NEGATIVE
POC PROTEIN,UA: NEGATIVE
Spec Grav, UA: 1.02 (ref 1.010–1.025)
Urobilinogen, UA: 1 E.U./dL
pH, UA: 6 (ref 5.0–8.0)

## 2020-07-17 NOTE — Telephone Encounter (Signed)
Erroneous encounter

## 2020-07-23 ENCOUNTER — Encounter: Payer: Self-pay | Admitting: Nurse Practitioner

## 2020-07-23 NOTE — Progress Notes (Signed)
[facility] [place], [place]  [address] Phone:  [phone]   Fax:  [phone] .   Established Patient Office Visit  Subjective:  Patient ID: [name], male    DOB: 09-Mar-1981  Age: 39 y.o. MRN: [id number]  CC:  Chief Complaint  Patient presents with  . Follow-up    HPI [name] presents for follow up. He  has a past medical history of Sickle cell anemia ([place]).  Mr Joslin does well overall. He admits that he did twist his left ankle on a photo shoot. This summer he suffered a similar injury to his right ankle. He does well overall with his SCD. Denies fever, headache, cough, wheezing, shortness of breath, chest pains, abdominal pain, back pain, hip pain, or leg pain. Denies any open wounds, skin irritation.  Last eye exam was unknown   Past Medical History:  Diagnosis Date  . Sickle cell anemia (HCC)     Past Surgical History:  Procedure Laterality Date  . CHOLECYSTECTOMY      Family History  Problem Relation Age of Onset  . Sickle cell anemia Mother   . Sickle cell trait Father   . Sickle cell trait Sister   . Sickle cell trait Brother   . Cancer Maternal Grandmother     Social History   Socioeconomic History  . Marital status: Single    Spouse name: Not on file  . Number of children: Not on file  . Years of education: Not on file  . Highest education level: Not on file  Occupational History  . Not on file  Tobacco Use  . Smoking status: Never Smoker  . Smokeless tobacco: Never Used  Vaping Use  . Vaping Use: Never used  Substance and Sexual Activity  . Alcohol use: No  . Drug use: No  . Sexual activity: Yes  Other Topics Concern  . Not on file  Social History Narrative  . Not on file   Social Determinants of Health   Financial Resource Strain: Not on file  Food Insecurity: Not on file  Transportation Needs: Not on file  Physical Activity: Not on file  Stress: Not on file  Social Connections: Not  on file  Intimate Partner Violence: Not on file    Outpatient Medications Prior to Visit  Medication Sig Dispense Refill  . cholecalciferol (VITAMIN D3) 25 MCG (1000 UNIT) tablet Take 1,000 Units by mouth daily.    . folic acid (FOLVITE) 791 MCG tablet Take 400 mcg by mouth daily.    . hydroxyurea (HYDREA) 500 MG capsule May take with food to minimize GI side effects. 360 capsule 1  . oxyCODONE-acetaminophen (PERCOCET) 10-325 MG tablet Take 1 tablet by mouth every 4 (four) hours as needed for up to 15 days for pain. 90 tablet 0  . naproxen (NAPROSYN) 500 MG tablet Take 1 tablet (500 mg total) by mouth 2 (two) times daily with a meal. (Patient not taking: Reported on 10/23/2019) 30 tablet 0   No facility-administered medications prior to visit.    No Known Allergies  ROS Review of Systems  All other systems reviewed and are negative.     Objective:    Physical Exam HENT:     Head: Normocephalic and atraumatic.     Nose: Nose normal.     Mouth/Throat:     Mouth: Mucous membranes are moist.  Cardiovascular:     Rate and Rhythm: Normal rate and regular rhythm.  Pulses: Normal pulses.     Heart sounds: Normal heart sounds.  Pulmonary:     Effort: Pulmonary effort is normal.     Breath sounds: Normal breath sounds.  Abdominal:     General: Bowel sounds are normal.     Palpations: Abdomen is soft.  Musculoskeletal:        General: Normal range of motion.     Cervical back: Normal range of motion.  Skin:    General: Skin is warm and dry.     Capillary Refill: Capillary refill takes less than 2 seconds.  Neurological:     General: No focal deficit present.     Mental Status: He is alert and oriented to person, place, and time.  Psychiatric:        Mood and Affect: Mood normal.        Behavior: Behavior normal.        Judgment: Judgment normal.     BP 113/62 (BP Location: Left Arm, Patient Position: Sitting, Cuff Size: Normal)   Pulse 66   Temp 98.4 F (36.9 C)  (Temporal)   Resp 16   Ht 6\' 1"  (1.854 m)   Wt 154 lb 9.6 oz (70.1 kg)   SpO2 98%   BMI 20.40 kg/m  Wt Readings from Last 3 Encounters:  07/17/20 154 lb 9.6 oz (70.1 kg)  04/17/20 152 lb 3.2 oz (69 kg)  10/23/19 153 lb (69.4 kg)     There are no preventive care reminders to display for this patient.  There are no preventive care reminders to display for this patient.  Lab Results  Component Value Date   TSH 1.122 09/05/2010   Lab Results  Component Value Date   WBC 7.2 04/17/2020   HGB 9.4 (L) 04/17/2020   HCT 28.3 (L) 04/17/2020   MCV 94 04/17/2020   PLT 534 (H) 04/17/2020   Lab Results  Component Value Date   NA 136 04/17/2020   K 4.4 04/17/2020   CO2 23 04/17/2020   GLUCOSE 88 04/17/2020   BUN 6 04/17/2020   CREATININE 0.75 (L) 04/17/2020   BILITOT 2.5 (H) 04/17/2020   ALKPHOS 76 04/17/2020   AST 28 04/17/2020   ALT 23 04/17/2020   PROT 8.2 04/17/2020   ALBUMIN 4.6 04/17/2020   CALCIUM 9.5 04/17/2020   ANIONGAP 8 09/13/2018   No results found for: CHOL No results found for: HDL No results found for: LDLCALC No results found for: TRIG No results found for: CHOLHDL No results found for: HGBA1C    Assessment & Plan:   Problem List Items Addressed This Visit      Other   Hb-SS disease without crisis ([place]) - Primary Ensure adequate hydration. Move frequently to reduce venous thromboembolism risk. Avoid situations that could lead to dehydration or could exacerbate pain Discussed S&S of infection, seizures, stroke acute chest, DVT and how important it is to seek medical attention Take medication as directed along with pain contract and overall compliance Discussed the risk related to opiate use (addition, tolerance and dependency)     Relevant Orders   POCT URINALYSIS DIP (CLINITEK) (Completed)   Chronic pain syndrome    Other Visit Diagnoses    Chronic prescription opiate use          No orders of the defined types were placed in this  encounter.   Follow-up: No follow-ups on file.    Vevelyn Francois, NP

## 2020-07-29 ENCOUNTER — Other Ambulatory Visit: Payer: Self-pay | Admitting: Nurse Practitioner

## 2020-07-29 ENCOUNTER — Telehealth: Payer: Self-pay | Admitting: Nurse Practitioner

## 2020-07-29 DIAGNOSIS — Z79891 Long term (current) use of opiate analgesic: Secondary | ICD-10-CM

## 2020-07-29 DIAGNOSIS — D571 Sickle-cell disease without crisis: Secondary | ICD-10-CM

## 2020-07-29 MED ORDER — OXYCODONE-ACETAMINOPHEN 10-325 MG PO TABS: 1.0000 | ORAL_TABLET | ORAL | 0 refills | Status: DC | PRN

## 2020-07-29 NOTE — Telephone Encounter (Signed)
sent 

## 2020-08-25 ENCOUNTER — Telehealth: Payer: Self-pay | Admitting: Nurse Practitioner

## 2020-08-27 ENCOUNTER — Other Ambulatory Visit: Payer: Self-pay | Admitting: Nurse Practitioner

## 2020-08-27 DIAGNOSIS — Z79891 Long term (current) use of opiate analgesic: Secondary | ICD-10-CM

## 2020-08-27 DIAGNOSIS — D571 Sickle-cell disease without crisis: Secondary | ICD-10-CM

## 2020-08-27 MED ORDER — OXYCODONE-ACETAMINOPHEN 10-325 MG PO TABS: 1.0000 | ORAL_TABLET | ORAL | 0 refills | Status: DC | PRN

## 2020-08-27 NOTE — Telephone Encounter (Signed)
sent 

## 2020-09-23 ENCOUNTER — Telehealth: Payer: Self-pay | Admitting: Nurse Practitioner

## 2020-09-23 ENCOUNTER — Other Ambulatory Visit: Payer: Self-pay | Admitting: Nurse Practitioner

## 2020-09-23 DIAGNOSIS — Z79891 Long term (current) use of opiate analgesic: Secondary | ICD-10-CM

## 2020-09-23 DIAGNOSIS — D571 Sickle-cell disease without crisis: Secondary | ICD-10-CM

## 2020-09-23 MED ORDER — OXYCODONE-ACETAMINOPHEN 10-325 MG PO TABS: 1.0000 | ORAL_TABLET | ORAL | 0 refills | Status: DC | PRN

## 2020-09-23 NOTE — Telephone Encounter (Signed)
Sent!

## 2020-10-15 ENCOUNTER — Telehealth (INDEPENDENT_AMBULATORY_CARE_PROVIDER_SITE_OTHER): Payer: Medicare Other | Admitting: Nurse Practitioner

## 2020-10-15 ENCOUNTER — Other Ambulatory Visit: Payer: Self-pay

## 2020-10-15 ENCOUNTER — Encounter: Payer: Self-pay | Admitting: Nurse Practitioner

## 2020-10-15 DIAGNOSIS — R059 Cough, unspecified: Secondary | ICD-10-CM | POA: Diagnosis not present

## 2020-10-15 DIAGNOSIS — D571 Sickle-cell disease without crisis: Secondary | ICD-10-CM

## 2020-10-15 DIAGNOSIS — Z79891 Long term (current) use of opiate analgesic: Secondary | ICD-10-CM

## 2020-10-15 MED ORDER — AMOXICILLIN-POT CLAVULANATE 875-125 MG PO TABS: 1.0000 | ORAL_TABLET | Freq: Two times a day (BID) | ORAL | 0 refills | Status: AC

## 2020-10-15 MED ORDER — OXYCODONE-ACETAMINOPHEN 10-325 MG PO TABS
1.0000 | ORAL_TABLET | ORAL | 0 refills | Status: DC | PRN
Start: 2020-10-15 — End: 2020-11-18

## 2020-10-15 NOTE — Progress Notes (Signed)
   [facility] [place] [place], [place]  [address] Phone:  [phone]   Fax:  [phone] Virtual Visit via Telephone Note  I connected with [name] on 10/15/20 at 11:20 AM EST by telephone and verified that I am speaking with the correct person using two identifiers.   I discussed the limitations, risks, security and privacy concerns of performing an evaluation and management service by telephone and the availability of in person appointments. I also discussed with the patient that there may be a patient responsible charge related to this service. The patient expressed understanding and agreed to proceed.  Patient home Provider Office  History of Present Illness: He visit today is a follow up. He  has a past medical history of Sickle cell anemia ([place]).  He was to be seen in the office but was changed to virtual due to he had a slight fever.  Review of Systems  Constitutional: Negative for fever.  Respiratory: Positive for cough and sputum production (green). Negative for shortness of breath and wheezing.   Cardiovascular: Negative for chest pain.  Gastrointestinal: Negative for nausea.  Neurological: Positive for dizziness (related to pain medication) and headaches.   Observations/Objective: No exam. virtual visit, able to speak in full sentences with difficulty  Assessment and Plan: Assessment  Primary Diagnosis & Pertinent Problem List: The primary encounter diagnosis was Cough. Diagnoses of Hb-SS disease without crisis ([place]) and Chronic prescription opiate use were also pertinent to this visit.  Visit Diagnosis: 1. Cough  Due to hx of acute chest and SCD will treat empirically with anbx to decrease further risk  Augmentin and Benzonatate Chest xray pending    2. Hb-SS disease without crisis ([place])  We discussed the need for good hydration, monitoring of hydration status, avoidance of heat, cold, stress, and infection triggers. We discussed the  risks and benefits of Hydrea, including bone marrow suppression, the possibility of GI upset, skin ulcers, hair thinning, and teratogenicity. The patient was reminded of the need to seek medical attention of any symptoms of bleeding, anemia, or infection. Continue folic acid 1 mg daily to prevent aplastic bone marrow crises.  3. Chronic prescription opiate use      Follow Up Instructions: 1-2 months    I discussed the assessment and treatment plan with the patient. The patient was provided an opportunity to ask questions and all were answered. The patient agreed with the plan and demonstrated an understanding of the instructions.   The patient was advised to call back or seek an in-person evaluation if the symptoms worsen or if the condition fails to improve as anticipated.  I provided 15 minutes of video- face-to-face time during this encounter.   Vevelyn Francois, NP

## 2020-11-13 ENCOUNTER — Ambulatory Visit: Payer: Self-pay | Admitting: Nurse Practitioner

## 2020-11-13 ENCOUNTER — Telehealth: Payer: Self-pay

## 2020-11-13 NOTE — Telephone Encounter (Signed)
Med refill percocet 3202

## 2020-11-13 NOTE — Telephone Encounter (Signed)
Why did he not come to his appointment?

## 2020-11-17 ENCOUNTER — Telehealth: Payer: Self-pay

## 2020-11-17 NOTE — Telephone Encounter (Signed)
Med refill second request Med refill percocet 7325

## 2020-11-18 ENCOUNTER — Other Ambulatory Visit: Payer: Self-pay | Admitting: Nurse Practitioner

## 2020-11-18 DIAGNOSIS — D571 Sickle-cell disease without crisis: Secondary | ICD-10-CM

## 2020-11-18 DIAGNOSIS — Z79891 Long term (current) use of opiate analgesic: Secondary | ICD-10-CM

## 2020-11-18 MED ORDER — OXYCODONE-ACETAMINOPHEN 10-325 MG PO TABS: 1.0000 | ORAL_TABLET | ORAL | 0 refills | Status: DC | PRN

## 2020-11-18 NOTE — Telephone Encounter (Signed)
Why did he not show up for his appointment?

## 2020-12-14 ENCOUNTER — Other Ambulatory Visit: Payer: Self-pay | Admitting: Nurse Practitioner

## 2020-12-14 ENCOUNTER — Telehealth: Payer: Self-pay

## 2020-12-14 DIAGNOSIS — D571 Sickle-cell disease without crisis: Secondary | ICD-10-CM

## 2020-12-14 DIAGNOSIS — Z79891 Long term (current) use of opiate analgesic: Secondary | ICD-10-CM

## 2020-12-14 MED ORDER — OXYCODONE-ACETAMINOPHEN 10-325 MG PO TABS: 1.0000 | ORAL_TABLET | ORAL | 0 refills | Status: DC | PRN

## 2020-12-14 NOTE — Telephone Encounter (Signed)
Med refill Percocet 

## 2020-12-14 NOTE — Telephone Encounter (Signed)
Please schedule patient an apt

## 2020-12-23 ENCOUNTER — Inpatient Hospital Stay (HOSPITAL_COMMUNITY)
Admission: AD | Admit: 2020-12-23 | Discharge: 2020-12-28 | DRG: 812 | Disposition: A | Discharge status: Home / Self Care | Payer: Medicare Other | Source: Ambulatory Visit | Attending: Internal Medicine | Admitting: Internal Medicine

## 2020-12-23 ENCOUNTER — Emergency Department (HOSPITAL_COMMUNITY)
Admission: EM | Admit: 2020-12-23 | Discharge: 2020-12-23 | Disposition: A | Discharge status: Home / Self Care | Payer: Medicare Other | Source: Home / Self Care | Attending: Emergency Medicine | Admitting: Emergency Medicine

## 2020-12-23 ENCOUNTER — Other Ambulatory Visit: Payer: Self-pay

## 2020-12-23 ENCOUNTER — Encounter (HOSPITAL_COMMUNITY): Payer: Self-pay | Admitting: Family Medicine

## 2020-12-23 DIAGNOSIS — G894 Chronic pain syndrome: Secondary | ICD-10-CM | POA: Diagnosis present

## 2020-12-23 DIAGNOSIS — D72829 Elevated white blood cell count, unspecified: Secondary | ICD-10-CM

## 2020-12-23 DIAGNOSIS — M79604 Pain in right leg: Secondary | ICD-10-CM | POA: Diagnosis not present

## 2020-12-23 DIAGNOSIS — D5700 Hb-SS disease with crisis, unspecified: Secondary | ICD-10-CM

## 2020-12-23 DIAGNOSIS — F1120 Opioid dependence, uncomplicated: Secondary | ICD-10-CM | POA: Diagnosis present

## 2020-12-23 DIAGNOSIS — R1111 Vomiting without nausea: Secondary | ICD-10-CM | POA: Diagnosis not present

## 2020-12-23 DIAGNOSIS — Z20822 Contact with and (suspected) exposure to covid-19: Secondary | ICD-10-CM | POA: Diagnosis present

## 2020-12-23 DIAGNOSIS — Z809 Family history of malignant neoplasm, unspecified: Secondary | ICD-10-CM

## 2020-12-23 DIAGNOSIS — D57219 Sickle-cell/Hb-C disease with crisis, unspecified: Secondary | ICD-10-CM | POA: Insufficient documentation

## 2020-12-23 DIAGNOSIS — D638 Anemia in other chronic diseases classified elsewhere: Secondary | ICD-10-CM | POA: Diagnosis present

## 2020-12-23 DIAGNOSIS — Z832 Family history of diseases of the blood and blood-forming organs and certain disorders involving the immune mechanism: Secondary | ICD-10-CM

## 2020-12-23 LAB — COMPREHENSIVE METABOLIC PANEL
ALT: 28 U/L (ref 0–44)
AST: 33 U/L (ref 15–41)
Albumin: 4.8 g/dL (ref 3.5–5.0)
Alkaline Phosphatase: 54 U/L (ref 38–126)
Anion gap: 7 (ref 5–15)
BUN: 7 mg/dL (ref 6–20)
CO2: 25 mmol/L (ref 22–32)
Calcium: 9.3 mg/dL (ref 8.9–10.3)
Chloride: 106 mmol/L (ref 98–111)
Creatinine, Ser: 0.61 mg/dL (ref 0.61–1.24)
GFR, Estimated: >60 mL/min (ref >60)
Glucose, Bld: 140 mg/dL — ABNORMAL HIGH (ref 70–99)
Potassium: 3.9 mmol/L (ref 3.5–5.1)
Sodium: 138 mmol/L (ref 135–145)
Total Bilirubin: 2.4 mg/dL — ABNORMAL HIGH (ref 0.3–1.2)
Total Protein: 8.6 g/dL — ABNORMAL HIGH (ref 6.5–8.1)

## 2020-12-23 LAB — CBC WITH DIFFERENTIAL/PLATELET
Abs Immature Granulocytes: 0.07 10*3/uL (ref 0.00–0.07)
Basophils Absolute: 0 10*3/uL (ref 0.0–0.1)
Basophils Relative: 0 %
Eosinophils Absolute: 0 10*3/uL (ref 0.0–0.5)
Eosinophils Relative: 0 %
HCT: 25.4 % — ABNORMAL LOW (ref 39.0–52.0)
Hemoglobin: 9.2 g/dL — ABNORMAL LOW (ref 13.0–17.0)
Immature Granulocytes: 1 %
Lymphocytes Relative: 5 %
Lymphs Abs: 0.8 10*3/uL (ref 0.7–4.0)
MCH: 32.1 pg (ref 26.0–34.0)
MCHC: 36.2 g/dL — ABNORMAL HIGH (ref 30.0–36.0)
MCV: 88.5 fL (ref 80.0–100.0)
Monocytes Absolute: 0.8 10*3/uL (ref 0.1–1.0)
Monocytes Relative: 6 %
Neutro Abs: 12.2 10*3/uL — ABNORMAL HIGH (ref 1.7–7.7)
Neutrophils Relative %: 88 %
Platelets: 407 10*3/uL — ABNORMAL HIGH (ref 150–400)
RBC: 2.87 MIL/uL — ABNORMAL LOW (ref 4.22–5.81)
RDW: 18.9 % — ABNORMAL HIGH (ref 11.5–15.5)
WBC: 13.8 10*3/uL — ABNORMAL HIGH (ref 4.0–10.5)
nRBC: 1.7 % — ABNORMAL HIGH (ref 0.0–0.2)

## 2020-12-23 LAB — RETICULOCYTES
Immature Retic Fract: 34.4 % — ABNORMAL HIGH (ref 2.3–15.9)
RBC.: 2.89 MIL/uL — ABNORMAL LOW (ref 4.22–5.81)
Retic Count, Absolute: 254 10*3/uL — ABNORMAL HIGH (ref 19.0–186.0)
Retic Ct Pct: 9.1 % — ABNORMAL HIGH (ref 0.4–3.1)

## 2020-12-23 MED ORDER — POLYETHYLENE GLYCOL 3350 17 G PO PACK: 17.0000 g | PACK | Freq: Every day | ORAL | Status: DC | PRN

## 2020-12-23 MED ORDER — NALOXONE HCL 0.4 MG/ML IJ SOLN: 0.4000 mg | INTRAMUSCULAR | Status: DC | PRN

## 2020-12-23 MED ORDER — FOLIC ACID 1 MG PO TABS
0.5000 mg | ORAL_TABLET | Freq: Every day | ORAL | Status: DC
  Administered 2020-12-23 – 2020-12-28 (×6): 0.5 mg via ORAL
  Filled 2020-12-23 (×6): qty 1

## 2020-12-23 MED ORDER — KETOROLAC TROMETHAMINE 30 MG/ML IJ SOLN
30.0000 mg | Freq: Once | INTRAMUSCULAR | Status: AC
  Administered 2020-12-23: 30 mg via INTRAVENOUS
  Filled 2020-12-23: qty 1

## 2020-12-23 MED ORDER — HYDROMORPHONE HCL 1 MG/ML IJ SOLN
1.0000 mg | Freq: Once | INTRAMUSCULAR | Status: AC
Start: 2020-12-23 — End: 2020-12-23
  Administered 2020-12-23: 1 mg via INTRAVENOUS
  Filled 2020-12-23: qty 1

## 2020-12-23 MED ORDER — HYDROMORPHONE HCL 1 MG/ML IJ SOLN
1.0000 mg | Freq: Once | INTRAMUSCULAR | Status: AC
  Administered 2020-12-23: 1 mg via INTRAVENOUS
  Filled 2020-12-23: qty 1

## 2020-12-23 MED ORDER — ENOXAPARIN SODIUM 40 MG/0.4ML IJ SOSY
40.0000 mg | PREFILLED_SYRINGE | INTRAMUSCULAR | Status: DC
  Administered 2020-12-24 – 2020-12-27 (×4): 40 mg via SUBCUTANEOUS
  Filled 2020-12-23 (×5): qty 0.4

## 2020-12-23 MED ORDER — KETOROLAC TROMETHAMINE 15 MG/ML IJ SOLN
15.0000 mg | Freq: Four times a day (QID) | INTRAMUSCULAR | Status: DC
  Administered 2020-12-23 – 2020-12-28 (×19): 15 mg via INTRAVENOUS
  Filled 2020-12-23 (×21): qty 1

## 2020-12-23 MED ORDER — ONDANSETRON HCL 4 MG/2ML IJ SOLN
4.0000 mg | Freq: Four times a day (QID) | INTRAMUSCULAR | Status: DC | PRN
  Administered 2020-12-23: 4 mg via INTRAVENOUS
  Filled 2020-12-23 (×2): qty 2

## 2020-12-23 MED ORDER — ONDANSETRON HCL 4 MG/2ML IJ SOLN
4.0000 mg | Freq: Once | INTRAMUSCULAR | Status: AC
  Administered 2020-12-23: 4 mg via INTRAVENOUS
  Filled 2020-12-23: qty 2

## 2020-12-23 MED ORDER — ACETAMINOPHEN 500 MG PO TABS
1000.0000 mg | ORAL_TABLET | Freq: Once | ORAL | Status: AC
  Administered 2020-12-23: 1000 mg via ORAL
  Filled 2020-12-23: qty 2

## 2020-12-23 MED ORDER — SENNOSIDES-DOCUSATE SODIUM 8.6-50 MG PO TABS
1.0000 | ORAL_TABLET | Freq: Two times a day (BID) | ORAL | Status: DC
  Administered 2020-12-24 – 2020-12-28 (×7): 1 via ORAL
  Filled 2020-12-23 (×8): qty 1

## 2020-12-23 MED ORDER — DIPHENHYDRAMINE HCL 25 MG PO CAPS
25.0000 mg | ORAL_CAPSULE | ORAL | Status: DC | PRN
  Filled 2020-12-23: qty 1

## 2020-12-23 MED ORDER — SODIUM CHLORIDE 0.9% FLUSH: 9.0000 mL | INTRAVENOUS | Status: DC | PRN

## 2020-12-23 MED ORDER — HYDROMORPHONE 1 MG/ML IV SOLN
INTRAVENOUS | Status: DC
Start: 2020-12-23 — End: 2020-12-25
  Administered 2020-12-23: 3 mg via INTRAVENOUS
  Administered 2020-12-23: 30 mg via INTRAVENOUS
  Administered 2020-12-24: 2 mg via INTRAVENOUS
  Administered 2020-12-24: 1.5 mg via INTRAVENOUS
  Administered 2020-12-25: 2 mg via INTRAVENOUS
  Filled 2020-12-23: qty 30

## 2020-12-23 MED ORDER — KETOROLAC TROMETHAMINE 30 MG/ML IJ SOLN
15.0000 mg | Freq: Once | INTRAMUSCULAR | Status: AC
  Administered 2020-12-23: 15 mg via INTRAVENOUS
  Filled 2020-12-23: qty 1

## 2020-12-23 MED ORDER — HYDROXYUREA 500 MG PO CAPS: 500.0000 mg | ORAL_CAPSULE | Freq: Once | ORAL | Status: DC

## 2020-12-23 MED ORDER — SODIUM CHLORIDE 0.45 % IV SOLN: INTRAVENOUS | Status: DC

## 2020-12-23 NOTE — ED Notes (Signed)
Pt given additional heat packs for pain. 

## 2020-12-23 NOTE — Progress Notes (Signed)
Pt admitted to the day hospital, from ED, for treatment of sickle cell pain crisis. Pt reported pain to R leg rated 7/10. Pt given PO Tylenol, IV zofran and toradol, placed on Dilaudid PCA, and hydrated with IV fluids. Pain not relieved enough for pt to be discharged home. Pt admitted to [address].  Report called to [employer]. Prior to transport patient reported pain a 3/10. Pt alert, oriented, and VSS. Pt transported in wheelchair to hospital by [facility].

## 2020-12-23 NOTE — ED Provider Notes (Signed)
[facility] DEPT Provider Note   CSN: [id number] Arrival date & time: 12/23/20  6606     History Chief Complaint  Patient presents with  . Sickle Cell Pain Crisis    [name] is a 40 y.o. male.  Patient with history of HGB-SS disease presents for evaluation of right lower back and hip pain that started last evening around 5:00 pm. The pain radiates into the right leg to the foot and he reports numbness of the medial aspect of the foot. No urinary or bowel dysfunction. He reports his typical pain from sickle cell is lower back but this is different. He reports doing a lot of kneeling and bending for work followed by a long car ride home which is when the pain started. No calf pain or swelling. No abdominal pain.  The history is provided by the patient. No language interpreter was used.  Sickle Cell Pain Crisis Associated symptoms: no chest pain, no fever and no shortness of breath        Past Medical History:  Diagnosis Date  . Sickle cell anemia [facility])     Patient Active Problem List   Diagnosis Date Noted  . Sickle cell pain crisis ([place]) 09/08/2015  . Routine general medical examination at a health care facility 02/11/2015  . Acute chest syndrome ([place]) 10/14/2014  . Hypokalemia 10/14/2014  . Anemia of chronic disease 10/14/2014  . CAP (community acquired pneumonia) 10/12/2014  . Sickle cell crisis ([place]) 10/10/2014  . Screening for STDs (sexually transmitted diseases) 01/09/2014  . Pain of right clavicle 11/14/2013  . Underweight 08/26/2013  . Need for Tdap vaccination 08/26/2013  . Hb-SS disease without crisis ([place]) 08/26/2013  . Sickle cell anemia with crisis ([place]) 08/21/2013  . Chronic pain syndrome 02/18/2013  . Sickle cell anemia ([place]) 01/18/2013  . Pain 01/18/2013    Past Surgical History:  Procedure Laterality Date  . CHOLECYSTECTOMY         Family History  Problem Relation Age of Onset  . Sickle cell anemia Mother    . Sickle cell trait Father   . Sickle cell trait Sister   . Sickle cell trait Brother   . Cancer Maternal Grandmother     Social History   Tobacco Use  . Smoking status: Never Smoker  . Smokeless tobacco: Never Used  Vaping Use  . Vaping Use: Never used  Substance Use Topics  . Alcohol use: No  . Drug use: No    Home Medications Prior to Admission medications   Medication Sig Start Date End Date Taking? Authorizing Provider  cholecalciferol (VITAMIN D3) 25 MCG (1000 UNIT) tablet Take 1,000 Units by mouth daily.    [provider]  folic acid (FOLVITE) 301 MCG tablet Take 400 mcg by mouth daily.    [provider]  hydroxyurea (HYDREA) 500 MG capsule May take with food to minimize GI side effects. 01/16/20   Vevelyn Francois, NP  oxyCODONE-acetaminophen (PERCOCET) 10-325 MG tablet Take 1 tablet by mouth every 4 (four) hours as needed for up to 15 days for pain. 12/14/20 12/29/20  Vevelyn Francois, NP    Allergies    Patient has no known allergies.  Review of Systems   Review of Systems  Constitutional: Negative for fever.  Respiratory: Negative for shortness of breath.   Cardiovascular: Negative for chest pain.  Gastrointestinal: Negative for abdominal pain.  Musculoskeletal: Positive for back pain.       See HPI.  Skin:  Negative for color change and wound.  Neurological: Positive for numbness. Negative for weakness.    Physical Exam Updated Vital Signs BP 125/78 (BP Location: Left Arm)   Pulse 80   Temp 98.9 F (37.2 C)   Resp 18   Ht 6\' 1"  (1.854 m)   Wt 70.3 kg   SpO2 100%   BMI 20.45 kg/m   Physical Exam Vitals and nursing note reviewed.  Constitutional:      Appearance: He is well-developed.  Cardiovascular:     Pulses: Normal pulses.  Pulmonary:     Effort: Pulmonary effort is normal.  Musculoskeletal:        General: Normal range of motion.     Cervical back: Normal range of motion.     Comments: No reproducible lumbar, right  paralumbar or right hip tenderness. No swelling or discoloration of the area. There is no weakness of the right lower extremity. FrOM.   Skin:    General: Skin is warm and dry.  Neurological:     Mental Status: He is alert and oriented to person, place, and time.     ED Results / Procedures / Treatments   Labs (all labs ordered are listed, but only abnormal results are displayed) Labs Reviewed  CBC WITH DIFFERENTIAL/PLATELET  RETICULOCYTES  COMPREHENSIVE METABOLIC PANEL   Results for orders placed or performed during the hospital encounter of 12/23/20  CBC with Differential  Result Value Ref Range   WBC 13.8 (H) 4.0 - 10.5 K/uL   RBC 2.87 (L) 4.22 - 5.81 MIL/uL   Hemoglobin 9.2 (L) 13.0 - 17.0 g/dL   HCT 25.4 (L) 39.0 - 52.0 %   MCV 88.5 80.0 - 100.0 fL   MCH 32.1 26.0 - 34.0 pg   MCHC 36.2 (H) 30.0 - 36.0 g/dL   RDW 18.9 (H) 11.5 - 15.5 %   Platelets 407 (H) 150 - 400 K/uL   nRBC 1.7 (H) 0.0 - 0.2 %   Neutrophils Relative % 88 %   Neutro Abs 12.2 (H) 1.7 - 7.7 K/uL   Lymphocytes Relative 5 %   Lymphs Abs 0.8 0.7 - 4.0 K/uL   Monocytes Relative 6 %   Monocytes Absolute 0.8 0.1 - 1.0 K/uL   Eosinophils Relative 0 %   Eosinophils Absolute 0.0 0.0 - 0.5 K/uL   Basophils Relative 0 %   Basophils Absolute 0.0 0.0 - 0.1 K/uL   Immature Granulocytes 1 %   Abs Immature Granulocytes 0.07 0.00 - 0.07 K/uL  Reticulocytes  Result Value Ref Range   Retic Ct Pct 9.1 (H) 0.4 - 3.1 %   RBC. 2.89 (L) 4.22 - 5.81 MIL/uL   Retic Count, Absolute 254.0 (H) 19.0 - 186.0 K/uL   Immature Retic Fract 34.4 (H) 2.3 - 15.9 %  Comprehensive metabolic panel  Result Value Ref Range   Sodium 138 135 - 145 mmol/L   Potassium 3.9 3.5 - 5.1 mmol/L   Chloride 106 98 - 111 mmol/L   CO2 25 22 - 32 mmol/L   Glucose, Bld 140 (H) 70 - 99 mg/dL   BUN 7 6 - 20 mg/dL   Creatinine, Ser 0.61 0.61 - 1.24 mg/dL   Calcium 9.3 8.9 - 10.3 mg/dL   Total Protein 8.6 (H) 6.5 - 8.1 g/dL   Albumin 4.8 3.5 - 5.0  g/dL   AST 33 15 - 41 U/L   ALT 28 0 - 44 U/L   Alkaline Phosphatase 54 38 - 126 U/L  Total Bilirubin 2.4 (H) 0.3 - 1.2 mg/dL   GFR, Estimated >60 >60 mL/min   Anion gap 7 5 - 15     EKG None  Radiology No results found.  Procedures Procedures   Medications Ordered in ED Medications  ketorolac (TORADOL) 30 MG/ML injection 30 mg (has no administration in time range)  HYDROmorphone (DILAUDID) injection 1 mg (has no administration in time range)    ED Course  I have reviewed the triage vital signs and the nursing notes.  Pertinent labs & imaging results that were available during my care of the patient were reviewed by me and considered in my medical decision making (see chart for details).    MDM Rules/Calculators/A&P                          Patient to ED with back pain into the right leg. History of sickle cell disease but reports he is not sure this is related as it is a different distribution of pain. No chest pain, cough, SOB or fever.   IV medications provided. On recheck, he feels improved but still has significant pain. Labs are stable. Will continue to obtain pain relief.   Recheck after 2nd dose of medication, pain continues. 3rd dose medication provided. Pain does continue, "4/10". He feels he would benefit from hospital treatment. Will plan to transfer to the sickle cell hospital at 8:00 am when it opens.   Patient care transferred to [facility], PA-C, to arrange transfer at 8:00 am.  Final Clinical Impression(s) / ED Diagnoses Final diagnoses:  None   1. Sickle Cell anemia with pain  Rx / DC Orders ED Discharge Orders    None       Charlann Lange, PA-C 12/23/20 0703    Veryl Speak, MD 12/23/20 2324

## 2020-12-23 NOTE — H&P (Signed)
H&P  Patient Demographics:  [name], is a 40 y.o. male  MRN: [id number]   DOB - 06-07-1981  Admit Date - 12/23/2020  Outpatient Primary MD for the patient is Vevelyn Francois, NP  No chief complaint on file.     HPI:    [name] is a 40 year old male with a medical history significant for sickle cell disease, chronic pain syndrome, opiate dependence and tolerance, and history of anemia of chronic disease presents with complaints right hip pain that is consistent with the previous sickle cell pain crisis.  Patient says that he was in his usual state of health until experiencing a long car ride from [place] 2 days prior to admission.  He also states that he was standing a great deal at a photo shoot.  He characterizes pain as constant and throbbing.  He last had Percocet this a.m. without sustained relief.  Patient reported to the emergency department for further evaluation, agreed with the ER provider that patient was appropriate to transition to sickle cell day clinic for further pain management and extended observation.  He currently rates pain as 9/10 primarily to the right hip.  He denies headache, chest pain, urinary symptoms, nausea, vomiting, or diarrhea.  He does not report any sick contacts, or known exposure to COVID-19.  Patient recently traveled to [place], [place].  Sickle cell day infusion clinic course:  Vital signs recorded as: BP (!) 102/46 (BP Location: Right Arm)   Pulse (!) 59   Temp 97.8 F (36.6 C) (Oral)   Resp 15   SpO2 98% .  Complete blood count shows WBCs 13.8, hemoglobin 9.2, and platelets 407.  Patient's complete metabolic panel shows an elevated bilirubin of 2.4, otherwise unremarkable.  COVID-19 test pending.  Patient's pain persists despite IV Dilaudid PCA, IV Toradol, IV fluids, and Tylenol.  Patient admitted to [facility] and observation for further management of sickle cell pain crisis.    Review of systems:  Review of Systems  Constitutional:  Negative for chills and fever.  HENT: Negative.   Eyes: Negative.   Respiratory: Negative.   Cardiovascular: Negative.   Gastrointestinal: Negative.   Genitourinary: Negative.   Musculoskeletal: Positive for back pain and joint pain.  Skin: Negative.   Neurological: Negative.   Psychiatric/Behavioral: Negative.      With Past History of the following :   Past Medical History:  Diagnosis Date  . Sickle cell anemia (HCC)       Past Surgical History:  Procedure Laterality Date  . CHOLECYSTECTOMY       Social History:   Social History   Tobacco Use  . Smoking status: Never Smoker  . Smokeless tobacco: Never Used  Substance Use Topics  . Alcohol use: No     Lives - At home   Family History :   Family History  Problem Relation Age of Onset  . Sickle cell anemia Mother   . Sickle cell trait Father   . Sickle cell trait Sister   . Sickle cell trait Brother   . Cancer Maternal Grandmother      Home Medications:   Prior to Admission medications   Medication Sig Start Date End Date Taking? Authorizing Provider  folic acid (FOLVITE) 284 MCG tablet Take 400 mcg by mouth daily.   Yes [provider]  hydroxyurea (HYDREA) 500 MG capsule May take with food to minimize GI side effects. 01/16/20  Yes Vevelyn Francois, NP  oxyCODONE-acetaminophen (PERCOCET) 10-325 MG  tablet Take 1 tablet by mouth every 4 (four) hours as needed for up to 15 days for pain. 12/14/20 12/29/20 Yes Vevelyn Francois, NP  cholecalciferol (VITAMIN D3) 25 MCG (1000 UNIT) tablet Take 1,000 Units by mouth daily.    [provider]     Allergies:   No Known Allergies   Physical Exam:   Vitals:   Vitals:   12/23/20 1218 12/23/20 1356  BP: (!) 111/50 (!) 102/46  Pulse: 82 (!) 59  Resp: 15 15  Temp: 99.1 F (37.3 C) 97.8 F (36.6 C)  SpO2: 98% 98%    Physical Exam: Constitutional: Patient appears well-developed and well-nourished. Not in obvious distress. HENT: Normocephalic,  atraumatic, External right and left ear normal. Oropharynx is clear and moist.  Eyes: Conjunctivae and EOM are normal. PERRLA, no scleral icterus. Neck: Normal ROM. Neck supple. No JVD. No tracheal deviation. No thyromegaly. CVS: RRR, S1/S2 +, no murmurs, no gallops, no carotid bruit.  Pulmonary: Effort and breath sounds normal, no stridor, rhonchi, wheezes, rales.  Abdominal: Soft. BS +, no distension, tenderness, rebound or guarding.  Musculoskeletal: Normal range of motion. No edema and no tenderness.  Lymphadenopathy: No lymphadenopathy noted, cervical, inguinal or axillary Neuro: Alert. Normal reflexes, muscle tone coordination. No cranial nerve deficit. Skin: Skin is warm and dry. No rash noted. Not diaphoretic. No erythema. No pallor. Psychiatric: Normal mood and affect. Behavior, judgment, thought content normal.   Data Review:   CBC Recent Labs  Lab 12/23/20 0327  WBC 13.8*  HGB 9.2*  HCT 25.4*  PLT 407*  MCV 88.5  MCH 32.1  MCHC 36.2*  RDW 18.9*  LYMPHSABS 0.8  MONOABS 0.8  EOSABS 0.0  BASOSABS 0.0   ------------------------------------------------------------------------------------------------------------------  Chemistries  Recent Labs  Lab 12/23/20 0327  NA 138  K 3.9  CL 106  CO2 25  GLUCOSE 140*  BUN 7  CREATININE 0.61  CALCIUM 9.3  AST 33  ALT 28  ALKPHOS 54  BILITOT 2.4*   ------------------------------------------------------------------------------------------------------------------ estimated creatinine clearance is 124.5 mL/min (by C-G formula based on SCr of 0.61 mg/dL). ------------------------------------------------------------------------------------------------------------------ No results for input(s): TSH, T4TOTAL, T3FREE, THYROIDAB in the last 72 hours.  Invalid input(s): FREET3  Coagulation profile No results for input(s): INR, PROTIME in the last 168  hours. ------------------------------------------------------------------------------------------------------------------- No results for input(s): DDIMER in the last 72 hours. -------------------------------------------------------------------------------------------------------------------  Cardiac Enzymes No results for input(s): CKMB, TROPONINI, MYOGLOBIN in the last 168 hours.  Invalid input(s): CK ------------------------------------------------------------------------------------------------------------------ No results found for: BNP  ---------------------------------------------------------------------------------------------------------------  Urinalysis    Component Value Date/Time   COLORURINE YELLOW 09/13/2018 [place] 09/13/2018 1526   LABSPEC 1.011 09/13/2018 1526   PHURINE 6.0 09/13/2018 1526   GLUCOSEU NEGATIVE 09/13/2018 1526   HGBUR SMALL (A) 09/13/2018 1526   BILIRUBINUR negative 07/17/2020 1136   BILIRUBINUR neg 10/23/2019 1334   KETONESUR negative 07/17/2020 [place] 09/13/2018 1526   PROTEINUR Negative 10/23/2019 1334   PROTEINUR NEGATIVE 09/13/2018 1526   UROBILINOGEN 1.0 07/17/2020 1136   UROBILINOGEN 4.0 (H) 08/23/2017 1051   NITRITE Negative 07/17/2020 1136   NITRITE neg 10/23/2019 1334   NITRITE NEGATIVE 09/13/2018 1526   LEUKOCYTESUR Trace (A) 07/17/2020 1136    ----------------------------------------------------------------------------------------------------------------   Imaging Results:    No results found.   Assessment & Plan:  Principal Problem:   Sickle cell pain crisis (HCC) Active Problems:   Chronic pain syndrome   Anemia of chronic disease   Leukocytosis  Sickle cell disease  with pain crisis: Admit patient, continue 0.45% saline at 100 mL/h. IV Dilaudid PCA with settings of 0.5 mg, 10-minute lockout, and 3 mg/h. Toradol 15 mg IV every 6 hours Hold Percocet, use PCA Dilaudid Monitor vital  signs very closely, reevaluate pain scale regularly, and supplemental as needed.  Leukocytosis: WBCs 13.8.  Patient afebrile.  No signs of active infection.  COVID-19 test pending. CBC in a.m.  Anemia of chronic disease: Patient's hemoglobin is 9.2, consistent with his baseline.  Labs in AM.  Chronic pain syndrome: Hold Percocet, use PCA Dilaudid    DVT Prophylaxis: Subcut Lovenox and SCDs  AM Labs Ordered, also please review Full Orders  Family Communication: Admission, patient's condition and plan of care including tests being ordered have been discussed with the patient who indicate understanding and agree with the plan and Code Status.  Code Status: Full Code  Consults called: None    Admission status: Inpatient    Time spent in minutes : 35 minutes  [place], MSN, FNP-C [facility] [address] [place], [place] [address] [phone]  12/23/2020 at 3:17 PM

## 2020-12-23 NOTE — Discharge Instructions (Signed)
Go to the Sickle cell clinic to be seen

## 2020-12-23 NOTE — ED Provider Notes (Signed)
I spoke to [name].  Pt to go to sickle cell clinic.  Room will be ready in 45 minutes    Sidney Ace 12/23/20 7014    Charlesetta Shanks, MD 12/31/20 0800

## 2020-12-23 NOTE — H&P (Signed)
[facility] History and Physical   Date: 12/23/2020  Patient name: [name] Medical record number: [id number] Date of birth: 09-01-1980 Age: 40 y.o. Gender: male PCP: Vevelyn Francois, NP  Attending physician: Tresa Garter, MD  Chief Complaint: Sickle cell pain  History of Present Illness: [name] is a 40 year old male with a medical history significant for sickle cell disease, chronic pain syndrome, opiate dependence and tolerance, and history of anemia of chronic disease presents with complaints right hip pain that is consistent with the previous sickle cell pain crisis.  Patient says that he was in his usual state of health until experiencing a long car ride from [place] 2 days prior to admission.  He also states that he was standing a great deal at a photo shoot.  He characterizes pain as constant and throbbing.  He last had Percocet this a.m. without sustained relief.  Patient reported to the emergency department for further evaluation, agreed with the ER provider that patient was appropriate to transition to sickle cell day clinic for further pain management and extended observation.  He currently rates pain as 9/10 primarily to the right hip.  He denies headache, chest pain, urinary symptoms, nausea, vomiting, or diarrhea.  He does not report any sick contacts, or known exposure to COVID-19.  Patient recently traveled to [place], [place].  Meds: Medications Prior to Admission  Medication Sig Dispense Refill Last Dose  . cholecalciferol (VITAMIN D3) 25 MCG (1000 UNIT) tablet Take 1,000 Units by mouth daily.     . folic acid (FOLVITE) 725 MCG tablet Take 400 mcg by mouth daily.     . hydroxyurea (HYDREA) 500 MG capsule May take with food to minimize GI side effects. 360 capsule 1   . oxyCODONE-acetaminophen (PERCOCET) 10-325 MG tablet Take 1 tablet by mouth every 4 (four) hours as needed for up to 15 days for pain. 90 tablet 0      Allergies: Patient has no known allergies. Past Medical History:  Diagnosis Date  . Sickle cell anemia (HCC)    Past Surgical History:  Procedure Laterality Date  . CHOLECYSTECTOMY     Family History  Problem Relation Age of Onset  . Sickle cell anemia Mother   . Sickle cell trait Father   . Sickle cell trait Sister   . Sickle cell trait Brother   . Cancer Maternal Grandmother    Social History   Socioeconomic History  . Marital status: Single    Spouse name: Not on file  . Number of children: Not on file  . Years of education: Not on file  . Highest education level: Not on file  Occupational History  . Not on file  Tobacco Use  . Smoking status: Never Smoker  . Smokeless tobacco: Never Used  Vaping Use  . Vaping Use: Never used  Substance and Sexual Activity  . Alcohol use: No  . Drug use: No  . Sexual activity: Yes  Other Topics Concern  . Not on file  Social History Narrative  . Not on file   Social Determinants of Health   Financial Resource Strain: Not on file  Food Insecurity: Not on file  Transportation Needs: Not on file  Physical Activity: Not on file  Stress: Not on file  Social Connections: Not on file  Intimate Partner Violence: Not on file  Review of Systems  Constitutional: Negative for chills and fever.  HENT: Negative.   Eyes: Negative.   Respiratory:  Negative.   Gastrointestinal: Negative.   Genitourinary: Negative.   Musculoskeletal: Positive for back pain and joint pain.  Skin: Negative.   Neurological: Negative.   Psychiatric/Behavioral: Negative.    Physical Exam: There were no vitals taken for this visit. Physical Exam Constitutional:      Appearance: Normal appearance.  Eyes:     Pupils: Pupils are equal, round, and reactive to light.  Cardiovascular:     Rate and Rhythm: Normal rate and regular rhythm.     Pulses: Normal pulses.  Pulmonary:     Effort: Pulmonary effort is normal.  Abdominal:     General: Bowel  sounds are normal.  Musculoskeletal:        General: Normal range of motion.  Skin:    General: Skin is warm.  Neurological:     General: No focal deficit present.     Mental Status: He is alert. Mental status is at baseline.  Psychiatric:        Mood and Affect: Mood normal.        Behavior: Behavior normal.        Thought Content: Thought content normal.        Judgment: Judgment normal.     Lab results: Results for orders placed or performed during the hospital encounter of 12/23/20 (from the past 24 hour(s))  CBC with Differential     Status: Abnormal   Collection Time: 12/23/20  3:27 AM  Result Value Ref Range   WBC 13.8 (H) 4.0 - 10.5 K/uL   RBC 2.87 (L) 4.22 - 5.81 MIL/uL   Hemoglobin 9.2 (L) 13.0 - 17.0 g/dL   HCT 25.4 (L) 39.0 - 52.0 %   MCV 88.5 80.0 - 100.0 fL   MCH 32.1 26.0 - 34.0 pg   MCHC 36.2 (H) 30.0 - 36.0 g/dL   RDW 18.9 (H) 11.5 - 15.5 %   Platelets 407 (H) 150 - 400 K/uL   nRBC 1.7 (H) 0.0 - 0.2 %   Neutrophils Relative % 88 %   Neutro Abs 12.2 (H) 1.7 - 7.7 K/uL   Lymphocytes Relative 5 %   Lymphs Abs 0.8 0.7 - 4.0 K/uL   Monocytes Relative 6 %   Monocytes Absolute 0.8 0.1 - 1.0 K/uL   Eosinophils Relative 0 %   Eosinophils Absolute 0.0 0.0 - 0.5 K/uL   Basophils Relative 0 %   Basophils Absolute 0.0 0.0 - 0.1 K/uL   Immature Granulocytes 1 %   Abs Immature Granulocytes 0.07 0.00 - 0.07 K/uL  Reticulocytes     Status: Abnormal   Collection Time: 12/23/20  3:27 AM  Result Value Ref Range   Retic Ct Pct 9.1 (H) 0.4 - 3.1 %   RBC. 2.89 (L) 4.22 - 5.81 MIL/uL   Retic Count, Absolute 254.0 (H) 19.0 - 186.0 K/uL   Immature Retic Fract 34.4 (H) 2.3 - 15.9 %  Comprehensive metabolic panel     Status: Abnormal   Collection Time: 12/23/20  3:27 AM  Result Value Ref Range   Sodium 138 135 - 145 mmol/L   Potassium 3.9 3.5 - 5.1 mmol/L   Chloride 106 98 - 111 mmol/L   CO2 25 22 - 32 mmol/L   Glucose, Bld 140 (H) 70 - 99 mg/dL   BUN 7 6 - 20 mg/dL    Creatinine, Ser 0.61 0.61 - 1.24 mg/dL   Calcium 9.3 8.9 - 10.3 mg/dL   Total Protein 8.6 (H) 6.5 - 8.1 g/dL  Albumin 4.8 3.5 - 5.0 g/dL   AST 33 15 - 41 U/L   ALT 28 0 - 44 U/L   Alkaline Phosphatase 54 38 - 126 U/L   Total Bilirubin 2.4 (H) 0.3 - 1.2 mg/dL   GFR, Estimated >60 >60 mL/min   Anion gap 7 5 - 15    Imaging results:  No results found.   Assessment & Plan: Patient admitted to sickle cell day infusion center for management of pain crisis.  Patient is opiate tolerant Initiate IV dilaudid PCA. Settings of 0.5 mg, 10 minute lockout, and 3 mg/hr IV fluids, 0.45% saline at 100 ml/hr Toradol 15 mg IV times one dose Tylenol 1000 mg by mouth times one dose Review CBC with differential, complete metabolic panel, and reticulocytes as results become available.  Pain intensity will be reevaluated in context of functioning and relationship to baseline as care progresses If pain intensity remains elevated and/or sudden change in hemodynamic stability transition to inpatient services for higher level of care.   Donia Pounds  APRN, MSN, FNP-C [facility] [address] [place], [place] [address] [phone]   12/23/2020, 9:56 AM

## 2020-12-23 NOTE — ED Notes (Signed)
Pt given additional heat packs for pain.

## 2020-12-23 NOTE — ED Triage Notes (Signed)
Patient from home via EMS for pain in the right lower leg and hip. Pain started at 5pm. Patient took home medications (percocet and hydroxyurea), but vomited soon after. 165mcg fentanyl given with EMS, pain went from 10/10 to 6/10.

## 2020-12-23 NOTE — ED Notes (Signed)
Pt reports pain from right hip extending down his leg.

## 2020-12-24 DIAGNOSIS — G894 Chronic pain syndrome: Secondary | ICD-10-CM | POA: Diagnosis not present

## 2020-12-24 DIAGNOSIS — Z809 Family history of malignant neoplasm, unspecified: Secondary | ICD-10-CM | POA: Diagnosis not present

## 2020-12-24 DIAGNOSIS — D72829 Elevated white blood cell count, unspecified: Secondary | ICD-10-CM | POA: Diagnosis not present

## 2020-12-24 DIAGNOSIS — Z20822 Contact with and (suspected) exposure to covid-19: Secondary | ICD-10-CM | POA: Diagnosis present

## 2020-12-24 DIAGNOSIS — D638 Anemia in other chronic diseases classified elsewhere: Secondary | ICD-10-CM | POA: Diagnosis not present

## 2020-12-24 DIAGNOSIS — D5700 Hb-SS disease with crisis, unspecified: Secondary | ICD-10-CM | POA: Diagnosis not present

## 2020-12-24 DIAGNOSIS — Z832 Family history of diseases of the blood and blood-forming organs and certain disorders involving the immune mechanism: Secondary | ICD-10-CM | POA: Diagnosis not present

## 2020-12-24 DIAGNOSIS — F1120 Opioid dependence, uncomplicated: Secondary | ICD-10-CM | POA: Diagnosis present

## 2020-12-24 LAB — BASIC METABOLIC PANEL
Anion gap: 6 (ref 5–15)
BUN: 5 mg/dL — ABNORMAL LOW (ref 6–20)
CO2: 27 mmol/L (ref 22–32)
Calcium: 8.9 mg/dL (ref 8.9–10.3)
Chloride: 101 mmol/L (ref 98–111)
Creatinine, Ser: 0.65 mg/dL (ref 0.61–1.24)
GFR, Estimated: >60 mL/min (ref >60)
Glucose, Bld: 115 mg/dL — ABNORMAL HIGH (ref 70–99)
Potassium: 3.9 mmol/L (ref 3.5–5.1)
Sodium: 134 mmol/L — ABNORMAL LOW (ref 135–145)

## 2020-12-24 LAB — CBC
HCT: 23.4 % — ABNORMAL LOW (ref 39.0–52.0)
Hemoglobin: 8.5 g/dL — ABNORMAL LOW (ref 13.0–17.0)
MCH: 31.6 pg (ref 26.0–34.0)
MCHC: 36.3 g/dL — ABNORMAL HIGH (ref 30.0–36.0)
MCV: 87 fL (ref 80.0–100.0)
Platelets: 334 10*3/uL (ref 150–400)
RBC: 2.69 MIL/uL — ABNORMAL LOW (ref 4.22–5.81)
RDW: 18.6 % — ABNORMAL HIGH (ref 11.5–15.5)
WBC: 13 10*3/uL — ABNORMAL HIGH (ref 4.0–10.5)
nRBC: 1.1 % — ABNORMAL HIGH (ref 0.0–0.2)

## 2020-12-24 LAB — HIV ANTIBODY (ROUTINE TESTING W REFLEX): HIV Screen 4th Generation wRfx: NONREACTIVE

## 2020-12-24 LAB — SARS CORONAVIRUS 2 (TAT 6-24 HRS): SARS Coronavirus 2: NEGATIVE

## 2020-12-24 MED ORDER — OXYCODONE-ACETAMINOPHEN 5-325 MG PO TABS
1.0000 | ORAL_TABLET | ORAL | Status: DC | PRN
  Administered 2020-12-24 – 2020-12-27 (×9): 1 via ORAL
  Filled 2020-12-24 (×9): qty 1

## 2020-12-24 MED ORDER — OXYCODONE HCL 5 MG PO TABS
5.0000 mg | ORAL_TABLET | ORAL | Status: DC | PRN
  Administered 2020-12-26 – 2020-12-27 (×3): 5 mg via ORAL
  Filled 2020-12-24 (×3): qty 1

## 2020-12-24 NOTE — Progress Notes (Signed)
Subjective: [name] is a 40 year old male with a medical history significant for sickle cell disease, chronic pain syndrome, and anemia of chronic disease was admitted for sickle cell pain crisis. Patient says that right hip pain has not improved overnight.  He continues to characterize pain is constant and throbbing.  He rates pain as 8/10.  He denies headache, chest pain, dizziness, shortness of breath, nausea, vomiting, or diarrhea. Objective:  Vital signs in last 24 hours:  Vitals:   12/24/20 0029 12/24/20 0412 12/24/20 0718 12/24/20 1154  BP:      Pulse:      Resp: 15 12 12 16   Temp:      TempSrc:      SpO2: 98% 98% 100% 100%    Intake/Output from previous day:   Intake/Output Summary (Last 24 hours) at 12/24/2020 1226 Last data filed at 12/24/2020 0700 Gross per 24 hour  Intake 2091.6 ml  Output 800 ml  Net 1291.6 ml    Physical Exam: General: Alert, awake, oriented x3, in no acute distress.  HEENT: [place]/AT PEERL, EOMI Neck: Trachea midline,  no masses, no thyromegal,y no JVD, no carotid bruit OROPHARYNX:  Moist, No exudate/ erythema/lesions.  Heart: Regular rate and rhythm, without murmurs, rubs, gallops, PMI non-displaced, no heaves or thrills on palpation.  Lungs: Clear to auscultation, no wheezing or rhonchi noted. No increased vocal fremitus resonant to percussion  Abdomen: Soft, nontender, nondistended, positive bowel sounds, no masses no hepatosplenomegaly noted..  Neuro: No focal neurological deficits noted cranial nerves II through XII grossly intact. DTRs 2+ bilaterally upper and lower extremities. Strength 5 out of 5 in bilateral upper and lower extremities. Musculoskeletal: No warm swelling or erythema around joints, no spinal tenderness noted. Psychiatric: Patient alert and oriented x3, good insight and cognition, good recent to remote recall. Lymph node survey: No cervical axillary or inguinal lymphadenopathy noted.  Lab Results:  Basic Metabolic  Panel:    Component Value Date/Time   NA 134 (L) 12/24/2020 0527   NA 136 04/17/2020 1412   K 3.9 12/24/2020 0527   CL 101 12/24/2020 0527   CO2 27 12/24/2020 0527   BUN 5 (L) 12/24/2020 0527   BUN 6 04/17/2020 1412   CREATININE 0.65 12/24/2020 0527   CREATININE 0.76 03/08/2017 1047   GLUCOSE 115 (H) 12/24/2020 0527   CALCIUM 8.9 12/24/2020 0527   CBC:    Component Value Date/Time   WBC 13.0 (H) 12/24/2020 0527   HGB 8.5 (L) 12/24/2020 0527   HGB 9.4 (L) 04/17/2020 1412   HCT 23.4 (L) 12/24/2020 0527   HCT 28.3 (L) 04/17/2020 1412   PLT 334 12/24/2020 0527   PLT 534 (H) 04/17/2020 1412   MCV 87.0 12/24/2020 0527   MCV 94 04/17/2020 1412   NEUTROABS 12.2 (H) 12/23/2020 0327   NEUTROABS 3.2 04/17/2020 1412   LYMPHSABS 0.8 12/23/2020 0327   LYMPHSABS 3.2 (H) 04/17/2020 1412   MONOABS 0.8 12/23/2020 0327   EOSABS 0.0 12/23/2020 0327   EOSABS 0.2 04/17/2020 1412   BASOSABS 0.0 12/23/2020 0327   BASOSABS 0.1 04/17/2020 1412    Recent Results (from the past 240 hour(s))  SARS CORONAVIRUS 2 (TAT 6-24 HRS) Nasopharyngeal Nasopharyngeal Swab     Status: None   Collection Time: 12/23/20  5:27 PM   Specimen: Nasopharyngeal Swab  Result Value Ref Range Status   SARS Coronavirus 2 NEGATIVE NEGATIVE Final    Comment: (NOTE) SARS-CoV-2 target nucleic acids are NOT DETECTED.  The SARS-CoV-2 RNA  is generally detectable in upper and lower respiratory specimens during the acute phase of infection. Negative results do not preclude SARS-CoV-2 infection, do not rule out co-infections with other pathogens, and should not be used as the sole basis for treatment or other patient management decisions. Negative results must be combined with clinical observations, patient history, and epidemiological information. The expected result is Negative.  Fact Sheet for Patients: SugarRoll.be  Fact Sheet for Healthcare  Providers: https://www.woods-mathews.com/  This test is not yet approved or cleared by the [place] FDA and  has been authorized for detection and/or diagnosis of SARS-CoV-2 by FDA under an Emergency Use Authorization (EUA). This EUA will remain  in effect (meaning this test can be used) for the duration of the COVID-19 declaration under Se ction 564(b)(1) of the Act, 21 U.S.C. section 360bbb-3(b)(1), unless the authorization is terminated or revoked sooner.  Performed at [facility], [place] [address] [place], [place] [address]     Studies/Results: No results found.  Medications: Scheduled Meds: . enoxaparin (LOVENOX) injection  40 mg Subcutaneous Q24H  . folic acid  0.5 mg Oral Daily  . HYDROmorphone   Intravenous Q4H  . ketorolac  15 mg Intravenous Q6H  . senna-docusate  1 tablet Oral BID   Continuous Infusions: . sodium chloride 100 mL/hr at 12/24/20 0540   PRN Meds:.diphenhydrAMINE, naloxone **AND** sodium chloride flush, ondansetron (ZOFRAN) IV, oxyCODONE-acetaminophen **AND** oxyCODONE, polyethylene glycol  Consultants:  None  Procedures:  None  Antibiotics:  None  Assessment/Plan: Principal Problem:   Sickle cell pain crisis (HCC) Active Problems:   Chronic pain syndrome   Anemia of chronic disease   Leukocytosis  Sickle cell disease with pain crisis: Decrease IV fluids to KVO Weaning IV Dilaudid PCA, settings decreased Restart home medication, Percocet 10-325 mg every 4 hours as needed for severe breakthrough pain Toradol 15 mg IV every 6 hours Monitor vital signs very closely, reevaluate pain scale regularly, and supplemental oxygen as needed.  Leukocytosis: WBC stable.  Patient afebrile without any signs of active infection.  Continue to monitor closely without antibiotics.  Repeat CBC in AM.  Sickle cell anemia: Today: Hemoglobin 8.5, slightly below patient's baseline.  However, there is no clinical indication for blood  transfusion at this time.  Continue to follow closely.  Labs in AM.  Chronic pain syndrome: Continue home medications   Code Status: Full Code Family Communication: N/A Disposition Plan: Not yet ready for discharge.  Possible discharge on 12/25/2020  [place], MSN, FNP-C Patient [place], [place] [address] [phone]  If 5PM-7AM, please contact night-coverage.  12/24/2020, 12:26 PM  LOS: 0 days

## 2020-12-25 DIAGNOSIS — D5700 Hb-SS disease with crisis, unspecified: Secondary | ICD-10-CM | POA: Diagnosis not present

## 2020-12-25 LAB — CBC
HCT: 23.3 % — ABNORMAL LOW (ref 39.0–52.0)
Hemoglobin: 8.4 g/dL — ABNORMAL LOW (ref 13.0–17.0)
MCH: 31.5 pg (ref 26.0–34.0)
MCHC: 36.1 g/dL — ABNORMAL HIGH (ref 30.0–36.0)
MCV: 87.3 fL (ref 80.0–100.0)
Platelets: 320 10*3/uL (ref 150–400)
RBC: 2.67 MIL/uL — ABNORMAL LOW (ref 4.22–5.81)
RDW: 18.8 % — ABNORMAL HIGH (ref 11.5–15.5)
WBC: 12.3 10*3/uL — ABNORMAL HIGH (ref 4.0–10.5)
nRBC: 1.6 % — ABNORMAL HIGH (ref 0.0–0.2)

## 2020-12-25 MED ORDER — HYDROXYUREA 500 MG PO CAPS
500.0000 mg | ORAL_CAPSULE | Freq: Every day | ORAL | Status: DC
  Administered 2020-12-25 – 2020-12-28 (×4): 500 mg via ORAL
  Filled 2020-12-25 (×4): qty 1

## 2020-12-25 MED ORDER — HYDROMORPHONE HCL 1 MG/ML IJ SOLN
1.0000 mg | Freq: Once | INTRAMUSCULAR | Status: AC
Start: 2020-12-25 — End: 2020-12-26
  Administered 2020-12-26: 1 mg via INTRAVENOUS
  Filled 2020-12-25: qty 1

## 2020-12-25 MED ORDER — HYDROMORPHONE 1 MG/ML IV SOLN
INTRAVENOUS | Status: DC
  Administered 2020-12-26: 2.4 mg via INTRAVENOUS
  Administered 2020-12-26: 1.8 mg via INTRAVENOUS
  Administered 2020-12-26: 3.6 mg via INTRAVENOUS
  Administered 2020-12-26: 3 mg via INTRAVENOUS
  Administered 2020-12-26: 1.2 mg via INTRAVENOUS
  Administered 2020-12-26: 0 mg via INTRAVENOUS
  Administered 2020-12-26: 1.8 mg via INTRAVENOUS
  Administered 2020-12-27: 2.4 mg via INTRAVENOUS
  Administered 2020-12-27: 1.2 mL via INTRAVENOUS
  Administered 2020-12-27: 3 mg via INTRAVENOUS
  Administered 2020-12-27: 0.6 mg via INTRAVENOUS
  Administered 2020-12-27: 3.69 mg via INTRAVENOUS
  Administered 2020-12-27: 1.8 mg via INTRAVENOUS
  Administered 2020-12-28: 3 mg via INTRAVENOUS
  Administered 2020-12-28: 0.6 mg via INTRAVENOUS
  Administered 2020-12-28: 1.8 mL via INTRAVENOUS
  Administered 2020-12-28: 30 mg via INTRAVENOUS
  Filled 2020-12-25 (×2): qty 30

## 2020-12-25 NOTE — Progress Notes (Signed)
Subjective: [name] is a 40 year old male with a medical history significant for sickle cell disease, chronic pain syndrome, opiate dependence and tolerance, and anemia of chronic disease that was admitted for sickle cell pain crisis.  Patient does not have any new complaints on today.  He continues to complain of significant pain to right hip that has not improved throughout admission.  Objective:  Vital signs in last 24 hours:  Vitals:   12/24/20 2317 12/25/20 0521 12/25/20 0724 12/25/20 1111  BP:      Pulse:      Resp: 19 16 12 12   Temp:      TempSrc:      SpO2: 94% 93% 100% 100%    Intake/Output from previous day:  No intake or output data in the 24 hours ending 12/25/20 1525  Physical Exam: General: Alert, awake, oriented x3, in no acute distress.  HEENT: [place]/AT PEERL, EOMI Neck: Trachea midline,  no masses, no thyromegal,y no JVD, no carotid bruit OROPHARYNX:  Moist, No exudate/ erythema/lesions.  Heart: Regular rate and rhythm, without murmurs, rubs, gallops, PMI non-displaced, no heaves or thrills on palpation.  Lungs: Clear to auscultation, no wheezing or rhonchi noted. No increased vocal fremitus resonant to percussion  Abdomen: Soft, nontender, nondistended, positive bowel sounds, no masses no hepatosplenomegaly noted..  Neuro: No focal neurological deficits noted cranial nerves II through XII grossly intact. DTRs 2+ bilaterally upper and lower extremities. Strength 5 out of 5 in bilateral upper and lower extremities. Musculoskeletal: No warm swelling or erythema around joints, no spinal tenderness noted. Psychiatric: Patient alert and oriented x3, good insight and cognition, good recent to remote recall. Lymph node survey: No cervical axillary or inguinal lymphadenopathy noted.  Lab Results:  Basic Metabolic Panel:    Component Value Date/Time   NA 134 (L) 12/24/2020 0527   NA 136 04/17/2020 1412   K 3.9 12/24/2020 0527   CL 101 12/24/2020 0527   CO2 27  12/24/2020 0527   BUN 5 (L) 12/24/2020 0527   BUN 6 04/17/2020 1412   CREATININE 0.65 12/24/2020 0527   CREATININE 0.76 03/08/2017 1047   GLUCOSE 115 (H) 12/24/2020 0527   CALCIUM 8.9 12/24/2020 0527   CBC:    Component Value Date/Time   WBC 12.3 (H) 12/25/2020 0725   HGB 8.4 (L) 12/25/2020 0725   HGB 9.4 (L) 04/17/2020 1412   HCT 23.3 (L) 12/25/2020 0725   HCT 28.3 (L) 04/17/2020 1412   PLT 320 12/25/2020 0725   PLT 534 (H) 04/17/2020 1412   MCV 87.3 12/25/2020 0725   MCV 94 04/17/2020 1412   NEUTROABS 12.2 (H) 12/23/2020 0327   NEUTROABS 3.2 04/17/2020 1412   LYMPHSABS 0.8 12/23/2020 0327   LYMPHSABS 3.2 (H) 04/17/2020 1412   MONOABS 0.8 12/23/2020 0327   EOSABS 0.0 12/23/2020 0327   EOSABS 0.2 04/17/2020 1412   BASOSABS 0.0 12/23/2020 0327   BASOSABS 0.1 04/17/2020 1412    Recent Results (from the past 240 hour(s))  SARS CORONAVIRUS 2 (TAT 6-24 HRS) Nasopharyngeal Nasopharyngeal Swab     Status: None   Collection Time: 12/23/20  5:27 PM   Specimen: Nasopharyngeal Swab  Result Value Ref Range Status   SARS Coronavirus 2 NEGATIVE NEGATIVE Final    Comment: (NOTE) SARS-CoV-2 target nucleic acids are NOT DETECTED.  The SARS-CoV-2 RNA is generally detectable in upper and lower respiratory specimens during the acute phase of infection. Negative results do not preclude SARS-CoV-2 infection, do not rule out co-infections with other pathogens,  and should not be used as the sole basis for treatment or other patient management decisions. Negative results must be combined with clinical observations, patient history, and epidemiological information. The expected result is Negative.  Fact Sheet for Patients: SugarRoll.be  Fact Sheet for Healthcare Providers: https://www.woods-mathews.com/  This test is not yet approved or cleared by the [place] FDA and  has been authorized for detection and/or diagnosis of SARS-CoV-2 by FDA  under an Emergency Use Authorization (EUA). This EUA will remain  in effect (meaning this test can be used) for the duration of the COVID-19 declaration under Se ction 564(b)(1) of the Act, 21 U.S.C. section 360bbb-3(b)(1), unless the authorization is terminated or revoked sooner.  Performed at [facility], [place] [address] [place], [place] [address]     Studies/Results: No results found.  Medications: Scheduled Meds: . enoxaparin (LOVENOX) injection  40 mg Subcutaneous Q24H  . folic acid  0.5 mg Oral Daily  . HYDROmorphone   Intravenous Q4H  .  HYDROmorphone (DILAUDID) injection  1 mg Intravenous Once  . hydroxyurea  500 mg Oral Daily  . ketorolac  15 mg Intravenous Q6H  . senna-docusate  1 tablet Oral BID   Continuous Infusions: . sodium chloride 50 mL/hr at 12/24/20 1234   PRN Meds:.diphenhydrAMINE, naloxone **AND** sodium chloride flush, ondansetron (ZOFRAN) IV, oxyCODONE-acetaminophen **AND** oxyCODONE, polyethylene glycol  Consultants:  None  Procedures:  None  Antibiotics:  9  Assessment/Plan: Principal Problem:   Sickle cell pain crisis (HCC) Active Problems:   Chronic pain syndrome   Anemia of chronic disease   Leukocytosis  Sickle cell disease with pain crisis: Continue IV fluids, 0.45% saline at 50 mL/h Patient continues to have significant pain, increase settings of IV Dilaudid PCA to 0.6 mg, 10-minute lockout, and 4 mg/h. Percocet 10-325 mg every 4 hours as needed for severe breakthrough pain Toradol 15 mg IV every 6 hours for total of 5 days Vital signs very closely, reevaluate pain scale regularly, and supplemental oxygen as needed.  Leukocytosis, WBCs stable.  Patient is afebrile without acute infection.  Continue to monitor closely without antibiotics.  Sickle cell anemia: Hemoglobin is stable and consistent with patient's baseline.  There is no clinical indication for blood.  Repeat CBC in AM.  Chronic pain syndrome: Continue home  medications  Code Status: Full Code Family Communication: N/A Disposition Plan: Not yet ready for discharge  [place], MSN, FNP-C Patient [facility] [place], [place] [address] [phone]  If 7PM-7AM, please contact night-coverage.  12/25/2020, 3:25 PM  LOS: 1 day

## 2020-12-26 DIAGNOSIS — G894 Chronic pain syndrome: Secondary | ICD-10-CM

## 2020-12-26 DIAGNOSIS — D638 Anemia in other chronic diseases classified elsewhere: Secondary | ICD-10-CM

## 2020-12-26 DIAGNOSIS — D5700 Hb-SS disease with crisis, unspecified: Secondary | ICD-10-CM | POA: Diagnosis not present

## 2020-12-26 LAB — URINE CULTURE: Culture: <10000 — AB

## 2020-12-26 NOTE — Progress Notes (Signed)
Patient ID: [name], male   DOB: November 28, 1980, 40 y.o.   MRN: [id number] Subjective: [name] is a 40 year old male with a medical history significant for sickle cell disease, chronic pain syndrome, opiate dependence and tolerance, and anemia of chronic disease that was admitted for sickle cell pain crisis.  Patient has no new complaints today but still in significant amount of pain.  He rates his pain at about 8/10 mostly in his right hip joints and inner thigh.  He denies any fever, cough, joint swelling or redness, nausea, vomiting or diarrhea.  No urinary symptoms.  Objective:  Vital signs in last 24 hours:  Vitals:   12/26/20 0737 12/26/20 0956 12/26/20 1145 12/26/20 1314  BP:  96/85  114/72  Pulse:  77  76  Resp: 17 20 19 20   Temp:  98.9 F (37.2 C)  97.9 F (36.6 C)  TempSrc:  Oral  Oral  SpO2: 95% 95% 95% 92%    Intake/Output from previous day:   Intake/Output Summary (Last 24 hours) at 12/26/2020 1450 Last data filed at 12/26/2020 0846 Gross per 24 hour  Intake 2845.95 ml  Output --  Net 2845.95 ml    Physical Exam: General: Alert, awake, oriented x3, in no acute distress.  HEENT: [place]/AT PEERL, EOMI Neck: Trachea midline,  no masses, no thyromegal,y no JVD, no carotid bruit OROPHARYNX:  Moist, No exudate/ erythema/lesions.  Heart: Regular rate and rhythm, without murmurs, rubs, gallops, PMI non-displaced, no heaves or thrills on palpation.  Lungs: Clear to auscultation, no wheezing or rhonchi noted. No increased vocal fremitus resonant to percussion  Abdomen: Soft, nontender, nondistended, positive bowel sounds, no masses no hepatosplenomegaly noted..  Neuro: No focal neurological deficits noted cranial nerves II through XII grossly intact. DTRs 2+ bilaterally upper and lower extremities. Strength 5 out of 5 in bilateral upper and lower extremities. Musculoskeletal: No warm swelling or erythema around joints, no spinal tenderness noted. Psychiatric: Patient  alert and oriented x3, good insight and cognition, good recent to remote recall. Lymph node survey: No cervical axillary or inguinal lymphadenopathy noted.  Lab Results:  Basic Metabolic Panel:    Component Value Date/Time   NA 134 (L) 12/24/2020 0527   NA 136 04/17/2020 1412   K 3.9 12/24/2020 0527   CL 101 12/24/2020 0527   CO2 27 12/24/2020 0527   BUN 5 (L) 12/24/2020 0527   BUN 6 04/17/2020 1412   CREATININE 0.65 12/24/2020 0527   CREATININE 0.76 03/08/2017 1047   GLUCOSE 115 (H) 12/24/2020 0527   CALCIUM 8.9 12/24/2020 0527   CBC:    Component Value Date/Time   WBC 12.3 (H) 12/25/2020 0725   HGB 8.4 (L) 12/25/2020 0725   HGB 9.4 (L) 04/17/2020 1412   HCT 23.3 (L) 12/25/2020 0725   HCT 28.3 (L) 04/17/2020 1412   PLT 320 12/25/2020 0725   PLT 534 (H) 04/17/2020 1412   MCV 87.3 12/25/2020 0725   MCV 94 04/17/2020 1412   NEUTROABS 12.2 (H) 12/23/2020 0327   NEUTROABS 3.2 04/17/2020 1412   LYMPHSABS 0.8 12/23/2020 0327   LYMPHSABS 3.2 (H) 04/17/2020 1412   MONOABS 0.8 12/23/2020 0327   EOSABS 0.0 12/23/2020 0327   EOSABS 0.2 04/17/2020 1412   BASOSABS 0.0 12/23/2020 0327   BASOSABS 0.1 04/17/2020 1412    Recent Results (from the past 240 hour(s))  SARS CORONAVIRUS 2 (TAT 6-24 HRS) Nasopharyngeal Nasopharyngeal Swab     Status: None   Collection Time: 12/23/20  5:27 PM  Specimen: Nasopharyngeal Swab  Result Value Ref Range Status   SARS Coronavirus 2 NEGATIVE NEGATIVE Final    Comment: (NOTE) SARS-CoV-2 target nucleic acids are NOT DETECTED.  The SARS-CoV-2 RNA is generally detectable in upper and lower respiratory specimens during the acute phase of infection. Negative results do not preclude SARS-CoV-2 infection, do not rule out co-infections with other pathogens, and should not be used as the sole basis for treatment or other patient management decisions. Negative results must be combined with clinical observations, patient history, and epidemiological  information. The expected result is Negative.  Fact Sheet for Patients: SugarRoll.be  Fact Sheet for Healthcare Providers: https://www.woods-mathews.com/  This test is not yet approved or cleared by the [place] FDA and  has been authorized for detection and/or diagnosis of SARS-CoV-2 by FDA under an Emergency Use Authorization (EUA). This EUA will remain  in effect (meaning this test can be used) for the duration of the COVID-19 declaration under Se ction 564(b)(1) of the Act, 21 U.S.C. section 360bbb-3(b)(1), unless the authorization is terminated or revoked sooner.  Performed at [facility], [place] [address], [place], [place] [address]   Urine culture     Status: Abnormal   Collection Time: 12/24/20  1:48 PM   Specimen: Urine, Clean Catch  Result Value Ref Range Status   Specimen Description   Final    URINE, CLEAN CATCH Performed at [facility], [place] [address], [place], [place] [address]    Special Requests   Final    NONE Performed at [facility], [place] [address] [place], [place] [address]    Culture (A)  Final    <10,000 COLONIES/mL INSIGNIFICANT GROWTH Performed at [place] [address] [place], [place] [address]    Report Status 12/26/2020 FINAL  Final    Studies/Results: No results found.  Medications: Scheduled Meds: . enoxaparin (LOVENOX) injection  40 mg Subcutaneous Q24H  . folic acid  0.5 mg Oral Daily  . HYDROmorphone   Intravenous Q4H  . hydroxyurea  500 mg Oral Daily  . ketorolac  15 mg Intravenous Q6H  . senna-docusate  1 tablet Oral BID   Continuous Infusions: . sodium chloride 50 mL/hr at 12/26/20 1327   PRN Meds:.diphenhydrAMINE, naloxone **AND** sodium chloride flush, ondansetron (ZOFRAN) IV, oxyCODONE-acetaminophen **AND** oxyCODONE, polyethylene  glycol  Consultants:  None  Procedures:  None  Antibiotics:  None  Assessment/Plan: Principal Problem:   Sickle cell pain crisis (HCC) Active Problems:   Chronic pain syndrome   Anemia of chronic disease   Leukocytosis  1. Hb Sickle Cell Disease with crisis: Reduce IV fluid to [facility], patient tolerating p.o. intake well, continue weight based Dilaudid PCA at current setting, continue IV Toradol 15 mg Q 6 H, continue oral pain medications as ordered monitor vitals very closely, Re-evaluate pain scale regularly, 2 L of Oxygen by [place]. 2. Leukocytosis: WBC count is now stable.  Patient is afebrile.  No clinical indication for antibiotics at this time.  We will continue to monitor very closely. 3. Sickle Cell Anemia: Hemoglobin is stable at baseline today. No clinical indication for blood transfusion. We will repeat CBC in a.m. and decide on transfusion as appropriate. 4. Chronic pain Syndrome: Continue home pain medications as ordered.  Code Status: Full Code Family Communication: N/A Disposition Plan: Not yet ready for discharge  Mayur Duman  If 7PM-7AM, please contact night-coverage.  12/26/2020, 2:50 PM  LOS: 2 days

## 2020-12-27 DIAGNOSIS — D72829 Elevated white blood cell count, unspecified: Secondary | ICD-10-CM

## 2020-12-27 DIAGNOSIS — D638 Anemia in other chronic diseases classified elsewhere: Secondary | ICD-10-CM | POA: Diagnosis not present

## 2020-12-27 DIAGNOSIS — G894 Chronic pain syndrome: Secondary | ICD-10-CM | POA: Diagnosis not present

## 2020-12-27 DIAGNOSIS — D5700 Hb-SS disease with crisis, unspecified: Secondary | ICD-10-CM | POA: Diagnosis not present

## 2020-12-27 LAB — CBC WITH DIFFERENTIAL/PLATELET
Abs Immature Granulocytes: 0.04 10*3/uL (ref 0.00–0.07)
Basophils Absolute: 0 10*3/uL (ref 0.0–0.1)
Basophils Relative: 0 %
Eosinophils Absolute: 0.4 10*3/uL (ref 0.0–0.5)
Eosinophils Relative: 3 %
HCT: 22 % — ABNORMAL LOW (ref 39.0–52.0)
Hemoglobin: 7.9 g/dL — ABNORMAL LOW (ref 13.0–17.0)
Immature Granulocytes: 0 %
Lymphocytes Relative: 22 %
Lymphs Abs: 2.5 10*3/uL (ref 0.7–4.0)
MCH: 31.5 pg (ref 26.0–34.0)
MCHC: 35.9 g/dL (ref 30.0–36.0)
MCV: 87.6 fL (ref 80.0–100.0)
Monocytes Absolute: 0.9 10*3/uL (ref 0.1–1.0)
Monocytes Relative: 8 %
Neutro Abs: 7.3 10*3/uL (ref 1.7–7.7)
Neutrophils Relative %: 67 %
Platelets: 324 10*3/uL (ref 150–400)
RBC: 2.51 MIL/uL — ABNORMAL LOW (ref 4.22–5.81)
RDW: 17.6 % — ABNORMAL HIGH (ref 11.5–15.5)
WBC: 11.1 10*3/uL — ABNORMAL HIGH (ref 4.0–10.5)
nRBC: 0.5 % — ABNORMAL HIGH (ref 0.0–0.2)

## 2020-12-27 NOTE — Progress Notes (Signed)
Patient ID: [name], male   DOB: [birth year], 40 y.o.   MRN: [id number] Subjective: [name] is a 40 year old male with a medical history significant for sickle cell disease, chronic pain syndrome, opiate dependence and tolerance, and anemia of chronic disease that was admitted for sickle cell pain crisis.  Patient claims his pain is slightly improved but not to baseline yet.  He rates his pain as 6/10 this morning, location still around his right hip joints and inner thigh.  No swelling or redness.  He denies any fever, dizziness, headache, cough, chest pain, shortness of breath.  No nausea, vomiting or diarrhea.  No urinary symptoms.  Objective:  Vital signs in last 24 hours:  Vitals:   12/27/20 0415 12/27/20 0607 12/27/20 0709 12/27/20 1310  BP:  123/85    Pulse:  72    Resp: 20 17 20 15   Temp:  98.7 F (37.1 C)    TempSrc:  Oral    SpO2: 94% 91% 97% 97%  Weight:  69.7 kg      Intake/Output from previous day:   Intake/Output Summary (Last 24 hours) at 12/27/2020 1511 Last data filed at 12/27/2020 0404 Gross per 24 hour  Intake 441.62 ml  Output --  Net 441.62 ml    Physical Exam: General: Alert, awake, oriented x3, in no acute distress.  HEENT: Tina/AT PEERL, EOMI Neck: Trachea midline,  no masses, no thyromegal,y no JVD, no carotid bruit OROPHARYNX:  Moist, No exudate/ erythema/lesions.  Heart: Regular rate and rhythm, without murmurs, rubs, gallops, PMI non-displaced, no heaves or thrills on palpation.  Lungs: Clear to auscultation, no wheezing or rhonchi noted. No increased vocal fremitus resonant to percussion  Abdomen: Soft, nontender, nondistended, positive bowel sounds, no masses no hepatosplenomegaly noted..  Neuro: No focal neurological deficits noted cranial nerves II through XII grossly intact. DTRs 2+ bilaterally upper and lower extremities. Strength 5 out of 5 in bilateral upper and lower extremities. Musculoskeletal: No warm swelling or erythema around  joints, no spinal tenderness noted. Psychiatric: Patient alert and oriented x3, good insight and cognition, good recent to remote recall. Lymph node survey: No cervical axillary or inguinal lymphadenopathy noted.  Lab Results:  Basic Metabolic Panel:    Component Value Date/Time   NA 134 (L) 12/24/2020 0527   NA 136 04/17/2020 1412   K 3.9 12/24/2020 0527   CL 101 12/24/2020 0527   CO2 27 12/24/2020 0527   BUN 5 (L) 12/24/2020 0527   BUN 6 04/17/2020 1412   CREATININE 0.65 12/24/2020 0527   CREATININE 0.76 03/08/2017 1047   GLUCOSE 115 (H) 12/24/2020 0527   CALCIUM 8.9 12/24/2020 0527   CBC:    Component Value Date/Time   WBC 11.1 (H) 12/27/2020 0845   HGB 7.9 (L) 12/27/2020 0845   HGB 9.4 (L) 04/17/2020 1412   HCT 22.0 (L) 12/27/2020 0845   HCT 28.3 (L) 04/17/2020 1412   PLT 324 12/27/2020 0845   PLT 534 (H) 04/17/2020 1412   MCV 87.6 12/27/2020 0845   MCV 94 04/17/2020 1412   NEUTROABS 7.3 12/27/2020 0845   NEUTROABS 3.2 04/17/2020 1412   LYMPHSABS 2.5 12/27/2020 0845   LYMPHSABS 3.2 (H) 04/17/2020 1412   MONOABS 0.9 12/27/2020 0845   EOSABS 0.4 12/27/2020 0845   EOSABS 0.2 04/17/2020 1412   BASOSABS 0.0 12/27/2020 0845   BASOSABS 0.1 04/17/2020 1412    Recent Results (from the past 240 hour(s))  SARS CORONAVIRUS 2 (TAT 6-24 HRS) Nasopharyngeal Nasopharyngeal Swab  Status: None   Collection Time: 12/23/20  5:27 PM   Specimen: Nasopharyngeal Swab  Result Value Ref Range Status   SARS Coronavirus 2 NEGATIVE NEGATIVE Final    Comment: (NOTE) SARS-CoV-2 target nucleic acids are NOT DETECTED.  The SARS-CoV-2 RNA is generally detectable in upper and lower respiratory specimens during the acute phase of infection. Negative results do not preclude SARS-CoV-2 infection, do not rule out co-infections with other pathogens, and should not be used as the sole basis for treatment or other patient management decisions. Negative results must be combined with clinical  observations, patient history, and epidemiological information. The expected result is Negative.  Fact Sheet for Patients: SugarRoll.be  Fact Sheet for Healthcare Providers: https://www.woods-mathews.com/  This test is not yet approved or cleared by the [place] FDA and  has been authorized for detection and/or diagnosis of SARS-CoV-2 by FDA under an Emergency Use Authorization (EUA). This EUA will remain  in effect (meaning this test can be used) for the duration of the COVID-19 declaration under Se ction 564(b)(1) of the Act, 21 U.S.C. section 360bbb-3(b)(1), unless the authorization is terminated or revoked sooner.  Performed at [facility], [place] [address], [place], [place] [address]   Urine culture     Status: Abnormal   Collection Time: 12/24/20  1:48 PM   Specimen: Urine, Clean Catch  Result Value Ref Range Status   Specimen Description   Final    URINE, CLEAN CATCH Performed at [facility], [place] [address], [place], [place] [address]    Special Requests   Final    NONE Performed at [facility], [place] [address] [place], [place] [address]    Culture (A)  Final    <10,000 COLONIES/mL INSIGNIFICANT GROWTH Performed at [place] [address], [place], [place] [address]    Report Status 12/26/2020 FINAL  Final    Studies/Results: No results found.  Medications: Scheduled Meds: . enoxaparin (LOVENOX) injection  40 mg Subcutaneous Q24H  . folic acid  0.5 mg Oral Daily  . HYDROmorphone   Intravenous Q4H  . hydroxyurea  500 mg Oral Daily  . ketorolac  15 mg Intravenous Q6H  . senna-docusate  1 tablet Oral BID   Continuous Infusions: . sodium chloride 10 mL/hr at 12/27/20 0404   PRN Meds:.diphenhydrAMINE, naloxone **AND** sodium chloride flush, ondansetron (ZOFRAN) IV, oxyCODONE-acetaminophen **AND** oxyCODONE, polyethylene  glycol  Consultants:  None  Procedures:  None  Antibiotics:  None  Assessment/Plan: Principal Problem:   Sickle cell pain crisis (HCC) Active Problems:   Chronic pain syndrome   Anemia of chronic disease   Leukocytosis  1. Hb Sickle Cell Disease with crisis: Continue IV fluid to [place], begin to wean weight based Dilaudid PCA in anticipation of discharge home tomorrow morning, continue IV Toradol 15 mg Q 6 H, continue oral pain medications as ordered monitor vitals very closely, Re-evaluate pain scale regularly, 2 L of Oxygen by [place]. 2. Leukocytosis: WBC count is now stable. Patient is afebrile.  No clinical indication for antibiotics at this time. We will continue to monitor very closely.  Repeat labs in a.m. 3. Sickle Cell Anemia: Hemoglobin is stable at baseline today. No clinical indication for blood transfusion.  4. Chronic pain Syndrome: Continue home pain medications as ordered.  Code Status: Full Code Family Communication: N/A Disposition Plan: Possible discharge home tomorrow morning.  [name]  If 7PM-7AM, please contact night-coverage.  12/27/2020, 3:11 PM  LOS: 3 days

## 2020-12-28 DIAGNOSIS — D5700 Hb-SS disease with crisis, unspecified: Secondary | ICD-10-CM | POA: Diagnosis not present

## 2020-12-28 NOTE — Discharge Summary (Signed)
Physician Discharge Summary  [name] DDU:[id number] DOB: [birth year] DOA: 12/23/2020  PCP: Vevelyn Francois, NP  Admit date: 12/23/2020  Discharge date: 12/28/2020  Discharge Diagnoses:  Principal Problem:   Sickle cell pain crisis ([place]) Active Problems:   Chronic pain syndrome   Anemia of chronic disease   Leukocytosis   Discharge Condition: Stable  Disposition:   Follow-up Information    Vevelyn Francois, NP Follow up in 2 week(s).   Specialty: Adult Health Nurse Practitioner Contact information: [address] [name] [phone]              Pt is discharged home in good condition and is to follow up with Vevelyn Francois, NP this week to have labs evaluated. [name] is instructed to increase activity slowly and balance with rest for the next few days, and use prescribed medication to complete treatment of pain  Diet: Regular Wt Readings from Last 3 Encounters:  12/28/20 67.7 kg  12/23/20 70.3 kg  07/17/20 70.1 kg    History of present illness:  [name] is a 40 year old male with a medical history significant for sickle cell disease, chronic pain syndrome, opiate dependence and tolerance, and history of anemia of chronic disease presents with complaints right hip pain that is consistent with the previous sickle cell pain crisis.  Patient says that he was in his usual state of health until experiencing a long car ride from [place] 2 days prior to admission.  He also states that he was standing a great deal at a photo shoot.  He characterizes pain as constant and throbbing.  He last had Percocet this a.m. without sustained relief.  Patient reported to the emergency department for further evaluation, agreed with the ER provider that patient was appropriate to transition to sickle cell day clinic for further pain management and extended observation.  He currently rates pain as 9/10 primarily to the right hip.  He denies headache,  chest pain, urinary symptoms, nausea, vomiting, or diarrhea.  He does not report any sick contacts, or known exposure to COVID-19.  Patient recently traveled to [place], [place].  Sickle cell day infusion clinic course:  Vital signs recorded as: BP (!) 102/46 (BP Location: Right Arm)   Pulse (!) 59   Temp 97.8 F (36.6 C) (Oral)   Resp 15   SpO2 98% .  Complete blood count shows WBCs 13.8, hemoglobin 9.2, and platelets 407.  Patient's complete metabolic panel shows an elevated bilirubin of 2.4, otherwise unremarkable.  COVID-19 test pending.  Patient's pain persists despite IV Dilaudid PCA, IV Toradol, IV fluids, and Tylenol.  Patient admitted to [place] and observation for further management of sickle cell pain crisis.  Hospital Course:  Sickle cell disease with pain crisis:  Patient was admitted for sickle cell pain crisis and managed appropriately with IVF, IV Dilaudid via PCA and IV Toradol, as well as other adjunct therapies per sickle cell pain management protocols.  IV Dilaudid PCA was weaned appropriately and patient transition to home medications.  Advised to resume home medication regimen.  Patient will follow-up with PCP in 2 weeks to repeat CBC with differential and CMP.  Patient says that his pain intensity has decreased to 1/10 and he is requesting discharge home today.  Patient is alert, oriented, and ambulating without assistance.  He is afebrile and oxygen saturation is 99% on RA.  Patient is aware of all upcoming appointments. Patient was therefore discharged home today  in a hemodynamically stable condition.   [name] will follow-up with PCP within 1 week of this discharge. [name] was counseled extensively about nonpharmacologic means of pain management, patient verbalized understanding and was appreciative of  the care received during this admission.   We discussed the need for good hydration, monitoring of hydration status, avoidance of heat, cold, stress, and infection  triggers. We discussed the need to be adherent with taking Hydrea and other home medications. Patient was reminded of the need to seek medical attention immediately if any symptom of bleeding, anemia, or infection occurs.  Discharge Exam: Vitals:   12/28/20 0859 12/28/20 1200  BP:    Pulse:    Resp: 16 17  Temp:    SpO2: 97% 100%   Vitals:   12/28/20 0603 12/28/20 0724 12/28/20 0859 12/28/20 1200  BP: 125/70     Pulse: 69     Resp: 15 16 16 17   Temp: 98.9 F (37.2 C)     TempSrc:      SpO2: 95% 95% 97% 100%  Weight: 67.7 kg       General appearance : Awake, alert, not in any distress. Speech Clear. Not toxic looking HEENT: Atraumatic and Normocephalic, pupils equally reactive to light and accomodation Neck: Supple, no JVD. No cervical lymphadenopathy.  Chest: Good air entry bilaterally, no added sounds  CVS: S1 S2 regular, no murmurs.  Abdomen: Bowel sounds present, Non tender and not distended with no guarding, rigidity or rebound. Extremities: B/L Lower Ext shows no edema, both legs are warm to touch Neurology: Awake alert, and oriented X 3, CN II-XII intact, Non focal Skin: No Rash  Discharge Instructions   Allergies as of 12/28/2020   No Known Allergies     Medication List    TAKE these medications   cholecalciferol 25 MCG (1000 UNIT) tablet Commonly known as: VITAMIN D3 Take 1,000 Units by mouth daily.   folic acid 220 MCG tablet Commonly known as: FOLVITE Take 400 mcg by mouth daily.   hydroxyurea 500 MG capsule Commonly known as: HYDREA May take with food to minimize GI side effects. What changed:   how much to take  how to take this  when to take this   oxyCODONE-acetaminophen 10-325 MG tablet Commonly known as: Percocet Take 1 tablet by mouth every 4 (four) hours as needed for up to 15 days for pain.       The results of significant diagnostics from this hospitalization (including imaging, microbiology, ancillary and laboratory) are listed  below for reference.    Significant Diagnostic Studies: No results found.  Microbiology: Recent Results (from the past 240 hour(s))  SARS CORONAVIRUS 2 (TAT 6-24 HRS) Nasopharyngeal Nasopharyngeal Swab     Status: None   Collection Time: 12/23/20  5:27 PM   Specimen: Nasopharyngeal Swab  Result Value Ref Range Status   SARS Coronavirus 2 NEGATIVE NEGATIVE Final    Comment: (NOTE) SARS-CoV-2 target nucleic acids are NOT DETECTED.  The SARS-CoV-2 RNA is generally detectable in upper and lower respiratory specimens during the acute phase of infection. Negative results do not preclude SARS-CoV-2 infection, do not rule out co-infections with other pathogens, and should not be used as the sole basis for treatment or other patient management decisions. Negative results must be combined with clinical observations, patient history, and epidemiological information. The expected result is Negative.  Fact Sheet for Patients: SugarRoll.be  Fact Sheet for Healthcare Providers: https://www.woods-mathews.com/  This test is not yet approved or cleared by the  [place] Architectural technologist and  has been authorized for detection and/or diagnosis of SARS-CoV-2 by FDA under an Print production planner (EUA). This EUA will remain  in effect (meaning this test can be used) for the duration of the COVID-19 declaration under Se ction 564(b)(1) of the Act, 21 U.S.C. section 360bbb-3(b)(1), unless the authorization is terminated or revoked sooner.  Performed at [facility], [place] [address] [place], [place] [address]   Urine culture     Status: Abnormal   Collection Time: 12/24/20  1:48 PM   Specimen: Urine, Clean Catch  Result Value Ref Range Status   Specimen Description   Final    URINE, CLEAN CATCH Performed at [facility], [place] [address], [place], [place] [address]    Special Requests   Final    NONE Performed at [facility], [place] [address] [place], [place] [address]    Culture (A)  Final    <10,000 COLONIES/mL INSIGNIFICANT GROWTH Performed at [place] [address], [place], [place] [address]    Report Status 12/26/2020 FINAL  Final     Labs: Basic Metabolic Panel: Recent Labs  Lab 12/23/20 0327 12/24/20 0527  NA 138 134*  K 3.9 3.9  CL 106 101  CO2 25 27  GLUCOSE 140* 115*  BUN 7 5*  CREATININE 0.61 0.65  CALCIUM 9.3 8.9   Liver Function Tests: Recent Labs  Lab 12/23/20 0327  AST 33  ALT 28  ALKPHOS 54  BILITOT 2.4*  PROT 8.6*  ALBUMIN 4.8   No results for input(s): LIPASE, AMYLASE in the last 168 hours. No results for input(s): AMMONIA in the last 168 hours. CBC: Recent Labs  Lab 12/23/20 0327 12/24/20 0527 12/25/20 0725 12/27/20 0845  WBC 13.8* 13.0* 12.3* 11.1*  NEUTROABS 12.2*  --   --  7.3  HGB 9.2* 8.5* 8.4* 7.9*  HCT 25.4* 23.4* 23.3* 22.0*  MCV 88.5 87.0 87.3 87.6  PLT 407* 334 320 324   Cardiac Enzymes: No results for input(s): CKTOTAL, CKMB, CKMBINDEX, TROPONINI in the last 168 hours. BNP: Invalid input(s): POCBNP CBG: No results for input(s): GLUCAP in the last 168 hours.  Time coordinating discharge: 35 minutes  Signed:  Donia Pounds  APRN, MSN, FNP-C [facility] [place],  [phone]  [facility] 12/28/2020, 4:22 PM

## 2020-12-28 NOTE — Care Management Important Message (Signed)
Important Message  Patient Details IM Letter given to the Patient. Name: [name] MRN: [id number] Date of Birth: [birth year]   Medicare Important Message Given:  Yes     Kerin Salen 12/28/2020, 9:12 AM

## 2021-01-04 ENCOUNTER — Telehealth: Payer: Self-pay

## 2021-01-04 NOTE — Telephone Encounter (Signed)
Med refill Percocet 

## 2021-01-06 ENCOUNTER — Other Ambulatory Visit: Payer: Self-pay | Admitting: Nurse Practitioner

## 2021-01-06 DIAGNOSIS — Z79891 Long term (current) use of opiate analgesic: Secondary | ICD-10-CM

## 2021-01-06 DIAGNOSIS — D571 Sickle-cell disease without crisis: Secondary | ICD-10-CM

## 2021-01-06 MED ORDER — OXYCODONE-ACETAMINOPHEN 10-325 MG PO TABS: 1.0000 | ORAL_TABLET | ORAL | 0 refills | Status: DC | PRN

## 2021-01-06 NOTE — Telephone Encounter (Signed)
Sent!

## 2021-01-06 NOTE — Progress Notes (Signed)
   [facility] [address] [place], [place]  [address] Phone:  [phone]   Fax:  [phone] 

## 2021-01-21 ENCOUNTER — Other Ambulatory Visit: Payer: Self-pay

## 2021-01-21 ENCOUNTER — Telehealth: Payer: Medicare Other | Admitting: Nurse Practitioner

## 2021-01-26 ENCOUNTER — Telehealth: Payer: Self-pay | Admitting: Nurse Practitioner

## 2021-01-27 NOTE — Telephone Encounter (Signed)
Sent to provider 

## 2021-01-28 ENCOUNTER — Other Ambulatory Visit: Payer: Self-pay | Admitting: Nurse Practitioner

## 2021-01-28 DIAGNOSIS — Z79891 Long term (current) use of opiate analgesic: Secondary | ICD-10-CM

## 2021-01-28 DIAGNOSIS — D571 Sickle-cell disease without crisis: Secondary | ICD-10-CM

## 2021-01-28 MED ORDER — OXYCODONE-ACETAMINOPHEN 10-325 MG PO TABS: 1.0000 | ORAL_TABLET | ORAL | 0 refills | Status: DC | PRN

## 2021-02-01 ENCOUNTER — Emergency Department (HOSPITAL_COMMUNITY): Payer: Medicare Other

## 2021-02-01 ENCOUNTER — Emergency Department (HOSPITAL_COMMUNITY)
Admission: EM | Admit: 2021-02-01 | Discharge: 2021-02-01 | Discharge status: Against Medical Advice | Payer: Medicare Other

## 2021-02-01 ENCOUNTER — Encounter: Payer: Self-pay | Admitting: Nurse Practitioner

## 2021-02-01 ENCOUNTER — Encounter (HOSPITAL_COMMUNITY): Payer: Self-pay | Admitting: Emergency Medicine

## 2021-02-01 ENCOUNTER — Inpatient Hospital Stay (HOSPITAL_COMMUNITY)
Admission: EM | Admit: 2021-02-01 | Discharge: 2021-02-06 | DRG: 812 | Disposition: A | Discharge status: Home / Self Care | Payer: Medicare Other | Attending: Internal Medicine | Admitting: Internal Medicine

## 2021-02-01 ENCOUNTER — Ambulatory Visit (INDEPENDENT_AMBULATORY_CARE_PROVIDER_SITE_OTHER): Payer: Medicare Other | Admitting: Nurse Practitioner

## 2021-02-01 ENCOUNTER — Other Ambulatory Visit: Payer: Self-pay

## 2021-02-01 VITALS — BP 107/71 | HR 61 | Temp 98.1°F | Ht 73.0 in | Wt 145.0 lb

## 2021-02-01 DIAGNOSIS — F1120 Opioid dependence, uncomplicated: Secondary | ICD-10-CM | POA: Diagnosis present

## 2021-02-01 DIAGNOSIS — D72829 Elevated white blood cell count, unspecified: Secondary | ICD-10-CM | POA: Diagnosis not present

## 2021-02-01 DIAGNOSIS — Z809 Family history of malignant neoplasm, unspecified: Secondary | ICD-10-CM

## 2021-02-01 DIAGNOSIS — E876 Hypokalemia: Secondary | ICD-10-CM | POA: Diagnosis present

## 2021-02-01 DIAGNOSIS — D571 Sickle-cell disease without crisis: Secondary | ICD-10-CM

## 2021-02-01 DIAGNOSIS — D5700 Hb-SS disease with crisis, unspecified: Secondary | ICD-10-CM | POA: Diagnosis not present

## 2021-02-01 DIAGNOSIS — G894 Chronic pain syndrome: Secondary | ICD-10-CM | POA: Diagnosis present

## 2021-02-01 DIAGNOSIS — Z20822 Contact with and (suspected) exposure to covid-19: Secondary | ICD-10-CM | POA: Diagnosis present

## 2021-02-01 DIAGNOSIS — D638 Anemia in other chronic diseases classified elsewhere: Secondary | ICD-10-CM | POA: Diagnosis not present

## 2021-02-01 DIAGNOSIS — R079 Chest pain, unspecified: Secondary | ICD-10-CM | POA: Diagnosis not present

## 2021-02-01 DIAGNOSIS — E559 Vitamin D deficiency, unspecified: Secondary | ICD-10-CM | POA: Diagnosis not present

## 2021-02-01 DIAGNOSIS — Z79899 Other long term (current) drug therapy: Secondary | ICD-10-CM | POA: Diagnosis not present

## 2021-02-01 DIAGNOSIS — R8290 Unspecified abnormal findings in urine: Secondary | ICD-10-CM | POA: Diagnosis not present

## 2021-02-01 DIAGNOSIS — Z832 Family history of diseases of the blood and blood-forming organs and certain disorders involving the immune mechanism: Secondary | ICD-10-CM | POA: Diagnosis not present

## 2021-02-01 LAB — COMPREHENSIVE METABOLIC PANEL
ALT: 19 U/L (ref 0–44)
AST: 42 U/L — ABNORMAL HIGH (ref 15–41)
Albumin: 5.3 g/dL — ABNORMAL HIGH (ref 3.5–5.0)
Alkaline Phosphatase: 62 U/L (ref 38–126)
Anion gap: 12 (ref 5–15)
BUN: 10 mg/dL (ref 6–20)
CO2: 24 mmol/L (ref 22–32)
Calcium: 9.9 mg/dL (ref 8.9–10.3)
Chloride: 103 mmol/L (ref 98–111)
Creatinine, Ser: 0.79 mg/dL (ref 0.61–1.24)
GFR, Estimated: >60 mL/min (ref >60)
Glucose, Bld: 109 mg/dL — ABNORMAL HIGH (ref 70–99)
Potassium: 3.4 mmol/L — ABNORMAL LOW (ref 3.5–5.1)
Sodium: 139 mmol/L (ref 135–145)
Total Bilirubin: 3.3 mg/dL — ABNORMAL HIGH (ref 0.3–1.2)
Total Protein: 9.2 g/dL — ABNORMAL HIGH (ref 6.5–8.1)

## 2021-02-01 LAB — POCT URINALYSIS DIPSTICK
Bilirubin, UA: NEGATIVE
Blood, UA: NEGATIVE
Glucose, UA: NEGATIVE
Ketones, UA: NEGATIVE
Leukocytes, UA: NEGATIVE
Nitrite, UA: NEGATIVE
Protein, UA: NEGATIVE
Spec Grav, UA: 1.015 (ref 1.010–1.025)
Urobilinogen, UA: 0.2 E.U./dL
pH, UA: 5.5 (ref 5.0–8.0)

## 2021-02-01 LAB — CBC WITH DIFFERENTIAL/PLATELET
Abs Immature Granulocytes: 0.52 10*3/uL — ABNORMAL HIGH (ref 0.00–0.07)
Basophils Absolute: 0.1 10*3/uL (ref 0.0–0.1)
Basophils Relative: 0 %
Eosinophils Absolute: 0.1 10*3/uL (ref 0.0–0.5)
Eosinophils Relative: 0 %
HCT: 25.4 % — ABNORMAL LOW (ref 39.0–52.0)
Hemoglobin: 9.1 g/dL — ABNORMAL LOW (ref 13.0–17.0)
Immature Granulocytes: 3 %
Lymphocytes Relative: 13 %
Lymphs Abs: 2.5 10*3/uL (ref 0.7–4.0)
MCH: 31.1 pg (ref 26.0–34.0)
MCHC: 35.8 g/dL (ref 30.0–36.0)
MCV: 86.7 fL (ref 80.0–100.0)
Monocytes Absolute: 0.7 10*3/uL (ref 0.1–1.0)
Monocytes Relative: 3 %
Neutro Abs: 16.4 10*3/uL — ABNORMAL HIGH (ref 1.7–7.7)
Neutrophils Relative %: 81 %
Platelets: 367 10*3/uL (ref 150–400)
RBC: 2.93 MIL/uL — ABNORMAL LOW (ref 4.22–5.81)
RDW: 19.9 % — ABNORMAL HIGH (ref 11.5–15.5)
WBC: 20.2 10*3/uL — ABNORMAL HIGH (ref 4.0–10.5)
nRBC: 3.8 % — ABNORMAL HIGH (ref 0.0–0.2)

## 2021-02-01 LAB — RETICULOCYTES
Immature Retic Fract: 31.6 % — ABNORMAL HIGH (ref 2.3–15.9)
RBC.: 2.91 MIL/uL — ABNORMAL LOW (ref 4.22–5.81)
Retic Count, Absolute: 64.4 10*3/uL (ref 19.0–186.0)
Retic Ct Pct: 10.9 % — ABNORMAL HIGH (ref 0.4–3.1)

## 2021-02-01 LAB — RESP PANEL BY RT-PCR (FLU A&B, COVID) ARPGX2
Influenza A by PCR: NEGATIVE
Influenza B by PCR: NEGATIVE
SARS Coronavirus 2 by RT PCR: NEGATIVE

## 2021-02-01 MED ORDER — HYDROMORPHONE HCL 1 MG/ML IJ SOLN
1.0000 mg | INTRAMUSCULAR | Status: DC | PRN
  Administered 2021-02-01: 1 mg via INTRAVENOUS
  Filled 2021-02-01: qty 1

## 2021-02-01 MED ORDER — HYDROMORPHONE HCL 1 MG/ML IJ SOLN
1.0000 mg | INTRAMUSCULAR | Status: AC
  Administered 2021-02-01: 1 mg via INTRAVENOUS
  Filled 2021-02-01: qty 1

## 2021-02-01 MED ORDER — HYDROMORPHONE HCL 1 MG/ML IJ SOLN
1.0000 mg | INTRAMUSCULAR | Status: AC
Start: 2021-02-01 — End: 2021-02-01
  Administered 2021-02-01: 1 mg via INTRAVENOUS
  Filled 2021-02-01: qty 1

## 2021-02-01 MED ORDER — KETOROLAC TROMETHAMINE 15 MG/ML IJ SOLN
15.0000 mg | INTRAMUSCULAR | Status: AC
  Administered 2021-02-01: 15 mg via INTRAVENOUS
  Filled 2021-02-01: qty 1

## 2021-02-01 MED ORDER — HYDROMORPHONE HCL 2 MG/ML IJ SOLN
2.0000 mg | INTRAMUSCULAR | Status: DC | PRN
  Administered 2021-02-01 – 2021-02-02 (×5): 2 mg via INTRAVENOUS
  Filled 2021-02-01 (×5): qty 1

## 2021-02-01 MED ORDER — OXYCODONE HCL 5 MG PO TABS
15.0000 mg | ORAL_TABLET | Freq: Once | ORAL | Status: AC
  Administered 2021-02-01: 15 mg via ORAL
  Filled 2021-02-01: qty 3

## 2021-02-01 MED ORDER — DEXTROSE-NACL 5-0.45 % IV SOLN: INTRAVENOUS | Status: DC

## 2021-02-01 NOTE — ED Notes (Signed)
Pt moving and shaking, not very cooperative with vitals or BP.

## 2021-02-01 NOTE — ED Notes (Signed)
Given hot packs,warm blankets and ice chips, light dimmed.

## 2021-02-01 NOTE — H&P (Signed)
History and Physical   [name] PIR:[id number] DOB: [birth year] DOA: 02/01/2021  Referring MD/NP/PA: Dr. Tamera Punt  PCP: Vevelyn Francois, NP   Outpatient Specialists: None  Patient coming from: Home  Chief Complaint: Pain throughout the body  HPI: [name] is a 40 y.o. male with medical history significant of sickle cell disease with previous multiple hospitalizations who is here in the ER complaining of sickle cell painful crisis.  Patient said he has had pain throughout his entire body.  Pain is consistent with his previous sickle cell crisis.  He is on Percocet at home which he took.  This has not relieved his pain.  He has had progressive worsening and is 10 out of 10.  He went to see his PCP today and per patient not get any change in his medicine so he came to the ER.  In the ER he was seen evaluated.  He received up to 6 mg of Dilaudid with his Percocet and pain is still said to be at 9 out of 10 so he is being admitted for further evaluation and treatment..  ED Course: Temperature is 98.2 blood pressure 94/78 pulse 132 respirate 26 oxygen sat 90% on room air.  White count 20.2 hemoglobin 9.1 platelets 367.  Potassium 3.4 otherwise chemistry within normal.  Patient is therefore being admitted to the hospital for sickle cell painful crisis.  Review of Systems: As per HPI otherwise 10 point review of systems negative.    Past Medical History:  Diagnosis Date   Sickle cell anemia ([place])     Past Surgical History:  Procedure Laterality Date   CHOLECYSTECTOMY       reports that he has never smoked. He has never used smokeless tobacco. He reports that he does not drink alcohol and does not use drugs.  No Known Allergies  Family History  Problem Relation Age of Onset   Sickle cell anemia Mother    Sickle cell trait Father    Sickle cell trait Sister    Sickle cell trait Brother    Cancer Maternal Grandmother      Prior to Admission medications   Medication Sig Start  Date End Date Taking? Authorizing Provider  cholecalciferol (VITAMIN D3) 25 MCG (1000 UNIT) tablet Take 1,000 Units by mouth daily.   Yes [provider]  folic acid (FOLVITE) 630 MCG tablet Take 400 mcg by mouth daily.   Yes [provider]  hydroxyurea (HYDREA) 500 MG capsule May take with food to minimize GI side effects. Patient taking differently: Take 500 mg by mouth 2 (two) times daily. May take with food to minimize GI side effects. 01/16/20  Yes Vevelyn Francois, NP  oxyCODONE-acetaminophen (PERCOCET) 10-325 MG tablet Take 1 tablet by mouth every 4 (four) hours as needed for up to 15 days for pain. 01/28/21 02/12/21 Yes Vevelyn Francois, NP    Physical Exam: Vitals:   02/01/21 1900 02/01/21 1930 02/01/21 2000 02/01/21 2030  BP: 131/78 123/79 117/72 95/78  Pulse: 85 87 84 (!) 101  Resp: 18 (!) 26 (!) 26 (!) 26  Temp:      TempSrc:      SpO2: 97% 98% 96% 92%  Weight:      Height:          Constitutional: Acutely ill looking in obvious distress due to pain v Vitals:   02/01/21 1900 02/01/21 1930 02/01/21 2000 02/01/21 2030  BP: 131/78 123/79 117/72 95/78  Pulse: 85 87 84 (!)  101  Resp: 18 (!) 26 (!) 26 (!) 26  Temp:      TempSrc:      SpO2: 97% 98% 96% 92%  Weight:      Height:       Eyes: PERRL, lids and conjunctivae normal ENMT: Mucous membranes are dry. Posterior pharynx clear of any exudate or lesions.Normal dentition.  Neck: normal, supple, no masses, no thyromegaly Respiratory: clear to auscultation bilaterally, no wheezing, no crackles. Normal respiratory effort. No accessory muscle use.  Cardiovascular: Sinus tachycardia, no murmurs / rubs / gallops. No extremity edema. 2+ pedal pulses. No carotid bruits.  Abdomen: no tenderness, no masses palpated. No hepatosplenomegaly. Bowel sounds positive.  Musculoskeletal: no clubbing / cyanosis. No joint deformity upper and lower extremities. Good ROM, no contractures. Normal muscle tone.  Skin: no rashes,  lesions, ulcers. No induration Neurologic: CN 2-12 grossly intact. Sensation intact, DTR normal. Strength 5/5 in all 4.  Psychiatric: Normal judgment and insight. Alert and oriented x 3. Normal mood.     Labs on Admission: I have personally reviewed following labs and imaging studies  CBC: Recent Labs  Lab 02/01/21 1720  WBC 20.2*  NEUTROABS 16.4*  HGB 9.1*  HCT 25.4*  MCV 86.7  PLT 660   Basic Metabolic Panel: Recent Labs  Lab 02/01/21 1720  NA 139  K 3.4*  CL 103  CO2 24  GLUCOSE 109*  BUN 10  CREATININE 0.79  CALCIUM 9.9   GFR: Estimated Creatinine Clearance: 116.9 mL/min (by C-G formula based on SCr of 0.79 mg/dL). Liver Function Tests: Recent Labs  Lab 02/01/21 1720  AST 42*  ALT 19  ALKPHOS 62  BILITOT 3.3*  PROT 9.2*  ALBUMIN 5.3*   No results for input(s): LIPASE, AMYLASE in the last 168 hours. No results for input(s): AMMONIA in the last 168 hours. Coagulation Profile: No results for input(s): INR, PROTIME in the last 168 hours. Cardiac Enzymes: No results for input(s): CKTOTAL, CKMB, CKMBINDEX, TROPONINI in the last 168 hours. BNP (last 3 results) No results for input(s): PROBNP in the last 8760 hours. HbA1C: No results for input(s): HGBA1C in the last 72 hours. CBG: No results for input(s): GLUCAP in the last 168 hours. Lipid Profile: No results for input(s): CHOL, HDL, LDLCALC, TRIG, CHOLHDL, LDLDIRECT in the last 72 hours. Thyroid Function Tests: No results for input(s): TSH, T4TOTAL, FREET4, T3FREE, THYROIDAB in the last 72 hours. Anemia Panel: Recent Labs    02/01/21 1720  RETICCTPCT 10.9*   Urine analysis:    Component Value Date/Time   COLORURINE YELLOW 09/13/2018 1526   APPEARANCEUR CLEAR 09/13/2018 1526   LABSPEC 1.011 09/13/2018 1526   PHURINE 6.0 09/13/2018 1526   GLUCOSEU NEGATIVE 09/13/2018 1526   HGBUR SMALL (A) 09/13/2018 1526   BILIRUBINUR neg 02/01/2021 1402   KETONESUR negative 07/17/2020 1136   [place] 09/13/2018 1526   PROTEINUR Negative 02/01/2021 1402   PROTEINUR NEGATIVE 09/13/2018 1526   UROBILINOGEN 0.2 02/01/2021 1402   UROBILINOGEN 4.0 (H) 08/23/2017 1051   NITRITE neg 02/01/2021 1402   NITRITE NEGATIVE 09/13/2018 1526   LEUKOCYTESUR Negative 02/01/2021 1402   Sepsis Labs: @LABRCNTIP (procalcitonin:4,lacticidven:4) )No results found for this or any previous visit (from the past 240 hour(s)).   Radiological Exams on Admission: DG Chest Portable 1 View  Result Date: 02/01/2021 CLINICAL DATA:  Chest pain with history of sickle cell EXAM: PORTABLE CHEST 1 VIEW COMPARISON:  09/13/2018 FINDINGS: Cardiac shadow is within normal limits. The lungs are well aerated  bilaterally. No focal infiltrate or sizable effusion is seen. No bony abnormality is seen. IMPRESSION: No active disease. Electronically Signed   By: Inez Catalina M.D.   On: 02/01/2021 20:05    EKG: Independently reviewed.  Sinus tachycardia otherwise no significant findings  Assessment/Plan Active Problems:   Chronic pain syndrome   Hb-SS disease without crisis (HCC)   Hypokalemia   Anemia of chronic disease   Leukocytosis   Sickle cell disease with crisis ([place])     #1 sickle cell painful crisis: Patient will be admitted for pain management.  Initiate IV Dilaudid PCA, Toradol, IV fluids with D5 half-normal at 125 cc an hour.  Patient is receiving Dilaudid 2 mg every 2 hours as needed while in the ER until able to get the PCA.  #2 hypokalemia: Replete potassium.  #3 leukocytosis: Secondary to vaso-occlusive crisis.  Continue to monitor.  #4 anemia of chronic disease: Hemoglobin appears to be at baseline.  Continue to monitor.   DVT prophylaxis: Lovenox Code Status: Full code Family Communication: No family at bedside Disposition Plan: Home Consults called: None Admission status: Inpatient  Severity of Illness: The appropriate patient status for this patient is INPATIENT. Inpatient status is judged to  be reasonable and necessary in order to provide the required intensity of service to ensure the patient's safety. The patient's presenting symptoms, physical exam findings, and initial radiographic and laboratory data in the context of their chronic comorbidities is felt to place them at high risk for further clinical deterioration. Furthermore, it is not anticipated that the patient will be medically stable for discharge from the hospital within 2 midnights of admission. The following factors support the patient status of inpatient.   " The patient's presenting symptoms include pain all over. " The worrisome physical exam findings include mild distress. " The initial radiographic and laboratory data are worrisome because of leukocytosis. " The chronic co-morbidities include sickle cell disease.   * I certify that at the point of admission it is my clinical judgment that the patient will require inpatient hospital care spanning beyond 2 midnights from the point of admission due to high intensity of service, high risk for further deterioration and high frequency of surveillance required.Barbette Merino MD [facility] Pager [phone]  If 7PM-7AM, please contact night-coverage [email or web address] Password [facility]  02/01/2021, 8:53 PM

## 2021-02-01 NOTE — Patient Instructions (Addendum)
Sickle Cell Anemia, Adult  Sickle cell anemia is a condition where your red blood cells are shaped like sickles. Red blood cells carry oxygen through the body. Sickle-shaped cells do not live as long as normal red blood cells. They also clump together and block blood from flowing through the blood vessels. This prevents the body from getting enough oxygen. Sickle cell anemia causes organ damage and pain. It alsoincreases the risk of infection. Follow these instructions at home: Medicines Take over-the-counter and prescription medicines only as told by your doctor. If you were prescribed an antibiotic medicine, take it as told by your doctor. Do not stop taking the antibiotic even if you start to feel better. If you develop a fever, do not take medicines to lower the fever right away. Tell your doctor about the fever. Managing pain, stiffness, and swelling Try these methods to help with pain: Use a heating pad. Take a warm bath. Distract yourself, such as by watching TV. Eating and drinking Drink enough fluid to keep your pee (urine) clear or pale yellow. Drink more in hot weather and during exercise. Limit or avoid alcohol. Eat a healthy diet. Eat plenty of fruits, vegetables, whole grains, and lean protein. Take vitamins and supplements as told by your doctor. Traveling When traveling, keep these with you: Your medical information. The names of your doctors. Your medicines. If you need to take an airplane, talk to your doctor first. Activity Rest often. Avoid exercises that make your heart beat much faster, such as jogging. General instructions Do not use products that have nicotine or tobacco, such as cigarettes and e-cigarettes. If you need help quitting, ask your doctor. Consider wearing a medical alert bracelet. Avoid being in high places (high altitudes), such as mountains. Avoid very hot or cold temperatures. Avoid places where the temperature changes a lot. Keep all follow-up  visits as told by your doctor. This is important. Contact a doctor if: A joint hurts. Your feet or hands hurt or swell. You feel tired (fatigued). Get help right away if: You have symptoms of infection. These include: Fever. Chills. Being very tired. Irritability. Poor eating. Throwing up (vomiting). You feel dizzy or faint. You have new stomach pain, especially on the left side. You have a an erection (priapism) that lasts more than 4 hours. You have numbness in your arms or legs. You have a hard time moving your arms or legs. You have trouble talking. You have pain that does not go away when you take medicine. You are short of breath. You are breathing fast. You have a long-term cough. You have pain in your chest. You have a bad headache. You have a stiff neck. Your stomach looks bloated even though you did not eat much. Your skin is pale. You suddenly cannot see well. Summary Sickle cell anemia is a condition where your red blood cells are shaped like sickles. Follow your doctor's advice on ways to manage pain, food to eat, activities to do, and steps to take for safe travel. Get medical help right away if you have any signs of infection, such as a fever. This information is not intended to replace advice given to you by your health care provider. Make sure you discuss any questions you have with your healthcare provider. Document Revised: 12/26/2019 Document Reviewed: 12/26/2019 Elsevier Patient Education  [place].

## 2021-02-01 NOTE — Progress Notes (Addendum)
[address] [place], [place]  [address] Phone:  [phone]   Fax:  [phone]   Established Patient Office Visit  Subjective:  Patient ID: [name], male    DOB: 1981/07/23  Age: 40 y.o. MRN: [id number]  CC:  Chief Complaint  Patient presents with   Follow-up    In crisis rate pain 5 out of 10. Took oxycodone right before coming.     HPI [name] presents for follow up. He  has a past medical history of Sickle cell anemia ([place]).   He is having back and chest pain. He continues to rub his thighs . He is concern that his SCD pain is getting worse. He feels like there are certain triggers with the weather. He ask reports that with the temperature change, he experiences more pain.Marland Kitchen He tries to stay hydrated and avoid known triggers.Marland Kitchen He is complaint with is medications . Denies fever, headache, cough, wheezing, shortness of breath, abdominal pain, hip pain.  Denies any open wounds, skin irritation. Last eye exam was in a few years. He felt like his last eye exam made his vision change. 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   Past Medical History:  Diagnosis Date   Sickle cell anemia (HCC)     Past Surgical History:  Procedure Laterality Date   CHOLECYSTECTOMY      Family History  Problem Relation Age of Onset   Sickle cell anemia Mother    Sickle cell trait Father    Sickle cell trait Sister    Sickle cell trait Brother    Cancer Maternal Grandmother     Social History   Socioeconomic History   Marital status: Single    Spouse name: Not on file   Number of children: Not on file   Years of education: Not on file   Highest education level: Not on file  Occupational History   Not on file  Tobacco Use   Smoking status: Never   Smokeless tobacco: Never  Vaping Use   Vaping Use: Never used  Substance and Sexual Activity   Alcohol use: No   Drug use: No   Sexual activity: Yes  Other Topics Concern    Not on file  Social History Narrative   Not on file   Social Determinants of Health   Financial Resource Strain: Not on file  Food Insecurity: Not on file  Transportation Needs: Not on file  Physical Activity: Not on file  Stress: Not on file  Social Connections: Not on file  Intimate Partner Violence: Not on file    No facility-administered medications prior to visit.   Outpatient Medications Prior to Visit  Medication Sig Dispense Refill   cholecalciferol (VITAMIN D3) 25 MCG (1000 UNIT) tablet Take 1,000 Units by mouth daily.     folic acid (FOLVITE) 496 MCG tablet Take 400 mcg by mouth daily.     hydroxyurea (HYDREA) 500 MG capsule May take with food to minimize GI side effects. (Patient taking differently: Take 500 mg by mouth 2 (two) times daily. May take with food to minimize GI side effects.) 360 capsule 1   oxyCODONE-acetaminophen (PERCOCET) 10-325 MG tablet Take 1 tablet by mouth every 4 (four) hours as needed for up to 15 days for pain. 90 tablet 0    No Known Allergies  ROS Review of Systems    Objective:    Physical Exam Constitutional:  Appearance: Normal appearance.  HENT:     Head: Normocephalic and atraumatic.     Nose: Nose normal.     Mouth/Throat:     Mouth: Mucous membranes are moist.  Cardiovascular:     Rate and Rhythm: Normal rate. Rhythm irregular.     Pulses: Normal pulses.     Heart sounds:    No gallop (abnormal beat resembles a gallop).  Pulmonary:     Effort: Pulmonary effort is normal.     Breath sounds: Normal breath sounds.  Abdominal:     General: Abdomen is flat.     Palpations: Abdomen is soft.  Musculoskeletal:        General: Normal range of motion.     Cervical back: Normal range of motion.  Skin:    General: Skin is warm and dry.     Capillary Refill: Capillary refill takes less than 2 seconds.  Neurological:     General: No focal deficit present.     Mental Status: He is alert and oriented to person, place, and  time.  Psychiatric:        Mood and Affect: Mood normal.        Behavior: Behavior normal.        Thought Content: Thought content normal.        Judgment: Judgment normal.    BP 107/71 (BP Location: Right Arm, Patient Position: Sitting)   Pulse 61   Temp 98.1 F (36.7 C)   Ht 6\' 1"  (1.854 m)   Wt 145 lb 0.2 oz (65.8 kg)   SpO2 93%   BMI 19.13 kg/m  Wt Readings from Last 3 Encounters:  02/01/21 145 lb 8.1 oz (66 kg)  02/01/21 145 lb 0.2 oz (65.8 kg)  12/28/20 149 lb 4 oz (67.7 kg)     Health Maintenance Due  Topic Date Due   COVID-19 Vaccine (1) Never done   Pneumococcal Vaccine 80-20 Years old (1 - PCV) Never done    There are no preventive care reminders to display for this patient.  Lab Results  Component Value Date   TSH 1.122 09/05/2010   Lab Results  Component Value Date   WBC 11.1 (H) 12/27/2020   HGB 7.9 (L) 12/27/2020   HCT 22.0 (L) 12/27/2020   MCV 87.6 12/27/2020   PLT 324 12/27/2020   Lab Results  Component Value Date   NA 134 (L) 12/24/2020   K 3.9 12/24/2020   CO2 27 12/24/2020   GLUCOSE 115 (H) 12/24/2020   BUN 5 (L) 12/24/2020   CREATININE 0.65 12/24/2020   BILITOT 2.4 (H) 12/23/2020   ALKPHOS 54 12/23/2020   AST 33 12/23/2020   ALT 28 12/23/2020   PROT 8.6 (H) 12/23/2020   ALBUMIN 4.8 12/23/2020   CALCIUM 8.9 12/24/2020   ANIONGAP 6 12/24/2020   No results found for: CHOL No results found for: HDL No results found for: LDLCALC No results found for: TRIG No results found for: CHOLHDL No results found for: HGBA1C    Assessment & Plan:   Problem List Items Addressed This Visit       Other   Hb-SS disease without crisis ([place]) - Primary We discussed the need for good hydration, monitoring of hydration status, avoidance of heat, cold, stress, and infection triggers. We discussed the risks and benefits of Hydrea, including bone marrow suppression, the possibility of GI upset, skin ulcers, hair thinning, and teratogenicity. The  patient was reminded of the need to seek medical attention  of any symptoms of bleeding, anemia, or infection. Continue folic acid 1 mg daily to prevent aplastic bone marrow crises. Move frequently to reduce venous thromboembolism risk. Avoid situations that could lead to dehydration or could exacerbate pain Discussed S&S of infection, seizures, stroke acute chest, DVT and how important it is to seek medical attention Take medication as directed along with pain contract and overall compliance Discussed the risk related to opiate use (addition, tolerance and dependency)     Relevant Orders   Urinalysis Dipstick (Completed)   Sickle Cell Panel   Other Visit Diagnoses     Abnormal urinalysis       Relevant Orders   Urine Culture       No orders of the defined types were placed in this encounter.   Follow-up: Return in about 3 months (around 05/04/2021) for Follow up SCD [id number].    Vevelyn Francois, NP

## 2021-02-01 NOTE — ED Provider Notes (Signed)
[name] DEPT Provider Note   CSN: [id number] Arrival date & time: 02/01/21  1636     History No chief complaint on file.   [name] is a 40 y.o. male with a past medical history of sickle cell anemia presenting to the ED for sickle cell pain crisis.  Reports pain throughout his entire body.  He takes Percocet at home but states this is not helped with his pain.  Symptoms began yesterday. Pain is also in chest.  Denies any vomiting, fever, or shortness of breath.  States that this is typical of his pain crises as far as location but because he has been waiting too long to be seen it has gotten gradually worse.  He saw his PCP today but "did not even do anything about the pain because they do not do patient care anymore."  Denies any injuries or falls, numbness or weakness.  HPI     Past Medical History:  Diagnosis Date   Sickle cell anemia ([place])     Patient Active Problem List   Diagnosis Date Noted   Leukocytosis 12/23/2020   Sickle cell pain crisis ([place]) 09/08/2015   Routine general medical examination at a health care facility 02/11/2015   Acute chest syndrome ([place]) 10/14/2014   Hypokalemia 10/14/2014   Anemia of chronic disease 10/14/2014   CAP (community acquired pneumonia) 10/12/2014   Sickle cell crisis ([place]) 10/10/2014   Screening for STDs (sexually transmitted diseases) 01/09/2014   Pain of right clavicle 11/14/2013   Underweight 08/26/2013   Need for Tdap vaccination 08/26/2013   Hb-SS disease without crisis ([name]) 08/26/2013   Sickle cell anemia with crisis ([place]) 08/21/2013   Chronic pain syndrome 02/18/2013   Sickle cell anemia ([place]) 01/18/2013   Pain 01/18/2013    Past Surgical History:  Procedure Laterality Date   CHOLECYSTECTOMY         Family History  Problem Relation Age of Onset   Sickle cell anemia Mother    Sickle cell trait Father    Sickle cell trait Sister    Sickle cell trait Brother    Cancer  Maternal Grandmother     Social History   Tobacco Use   Smoking status: Never   Smokeless tobacco: Never  Vaping Use   Vaping Use: Never used  Substance Use Topics   Alcohol use: No   Drug use: No    Home Medications Prior to Admission medications   Medication Sig Start Date End Date Taking? Authorizing Provider  cholecalciferol (VITAMIN D3) 25 MCG (1000 UNIT) tablet Take 1,000 Units by mouth daily.    [provider]  folic acid (FOLVITE) 885 MCG tablet Take 400 mcg by mouth daily.    [provider]  hydroxyurea (HYDREA) 500 MG capsule May take with food to minimize GI side effects. Patient taking differently: Take 500 mg by mouth daily. May take with food to minimize GI side effects. 01/16/20   Vevelyn Francois, NP  oxyCODONE-acetaminophen (PERCOCET) 10-325 MG tablet Take 1 tablet by mouth every 4 (four) hours as needed for up to 15 days for pain. 01/28/21 02/12/21  Vevelyn Francois, NP    Allergies    Patient has no known allergies.  Review of Systems   Review of Systems  Constitutional:  Negative for appetite change, chills and fever.  HENT:  Negative for ear pain, rhinorrhea, sneezing and sore throat.   Eyes:  Negative for photophobia and visual disturbance.  Respiratory:  Negative for  cough, chest tightness, shortness of breath and wheezing.   Cardiovascular:  Negative for chest pain and palpitations.  Gastrointestinal:  Negative for abdominal pain, blood in stool, constipation, diarrhea, nausea and vomiting.  Genitourinary:  Negative for dysuria, hematuria and urgency.  Musculoskeletal:  Positive for myalgias.  Skin:  Negative for rash.  Neurological:  Negative for dizziness, weakness and light-headedness.   Physical Exam Updated Vital Signs BP 131/78 (BP Location: Left Arm)   Pulse 85   Temp 98.2 F (36.8 C) (Oral)   Resp 18   Ht 6\' 1"  (1.854 m)   Wt 66 kg   SpO2 97%   BMI 19.20 kg/m   Physical Exam Vitals and nursing note reviewed.   Constitutional:      General: He is not in acute distress.    Appearance: He is well-developed.  HENT:     Head: Normocephalic and atraumatic.     Nose: Nose normal.  Eyes:     General: No scleral icterus.       Left eye: No discharge.     Conjunctiva/sclera: Conjunctivae normal.  Cardiovascular:     Rate and Rhythm: Normal rate and regular rhythm.     Heart sounds: Normal heart sounds. No murmur heard.   No friction rub. No gallop.  Pulmonary:     Effort: Pulmonary effort is normal. No respiratory distress.     Breath sounds: Normal breath sounds.  Abdominal:     General: Bowel sounds are normal. There is no distension.     Palpations: Abdomen is soft.     Tenderness: There is no abdominal tenderness. There is no guarding.  Musculoskeletal:        General: Normal range of motion.     Cervical back: Normal range of motion and neck supple.  Skin:    General: Skin is warm and dry.     Findings: No rash.  Neurological:     Mental Status: He is alert.     Motor: No abnormal muscle tone.     Coordination: Coordination normal.    ED Results / Procedures / Treatments   Labs (all labs ordered are listed, but only abnormal results are displayed) Labs Reviewed  COMPREHENSIVE METABOLIC PANEL - Abnormal; Notable for the following components:      Result Value   Potassium 3.4 (*)    Glucose, Bld 109 (*)    Total Protein 9.2 (*)    Albumin 5.3 (*)    AST 42 (*)    Total Bilirubin 3.3 (*)    All other components within normal limits  CBC WITH DIFFERENTIAL/PLATELET - Abnormal; Notable for the following components:   WBC 20.2 (*)    RBC 2.93 (*)    Hemoglobin 9.1 (*)    HCT 25.4 (*)    RDW 19.9 (*)    nRBC 3.8 (*)    Neutro Abs 16.4 (*)    Abs Immature Granulocytes 0.52 (*)    All other components within normal limits  RETICULOCYTES - Abnormal; Notable for the following components:   Retic Ct Pct 10.9 (*)    RBC. 2.91 (*)    Immature Retic Fract 31.6 (*)    All other  components within normal limits  RESP PANEL BY RT-PCR (FLU A&B, COVID) [id number]    EKG None  Radiology DG Chest Portable 1 View  Result Date: 02/01/2021 CLINICAL DATA:  Chest pain with history of sickle cell EXAM: PORTABLE CHEST 1 VIEW COMPARISON:  09/13/2018 FINDINGS: Cardiac shadow is  within normal limits. The lungs are well aerated bilaterally. No focal infiltrate or sizable effusion is seen. No bony abnormality is seen. IMPRESSION: No active disease. Electronically Signed   By: Inez Catalina M.D.   On: 02/01/2021 20:05    Procedures Procedures   Medications Ordered in ED Medications  dextrose 5 %-0.45 % sodium chloride infusion ( Intravenous New Bag/Given 02/01/21 1743)  HYDROmorphone (DILAUDID) injection 1 mg (1 mg Intravenous Given 02/01/21 1746)  HYDROmorphone (DILAUDID) injection 1 mg (1 mg Intravenous Given 02/01/21 1821)  ketorolac (TORADOL) 15 MG/ML injection 15 mg (15 mg Intravenous Given 02/01/21 1745)  oxyCODONE (Oxy IR/ROXICODONE) immediate release tablet 15 mg (15 mg Oral Given 02/01/21 1953)    ED Course  I have reviewed the triage vital signs and the nursing notes.  Pertinent labs & imaging results that were available during my care of the patient were reviewed by me and considered in my medical decision making (see chart for details).  Clinical Course as of 02/01/21 2019  Mon Feb 01, 2021  1931 WBC(!): 20.2 [HK]  1931 Hemoglobin(!): 9.1 [HK]  1935 Patient endorsing chest pain.  He states that this is typical of his pain crises but I will add on an EKG and a chest x-ray as he is awaiting third dose of pain medication. [HK]    Clinical Course User Index [HK] Delia Heady, PA-C   MDM Rules/Calculators/A&P                          40 year old male with a past medical history of sickle cell anemia presenting to the ED for sickle cell pain crisis.  Reports pain throughout his entire body including his chest.  He states that he typically has pain throughout his entire  body including his chest with his pain crises.  Minimal improvement noted with home Percocet.  Denies any shortness of breath, vomiting, fever or abdominal pain.  On exam patient vital signs within normal limits.  He does appear uncomfortable but is speaking complete sentences without signs of respiratory distress.  Work-up here shows a leukocytosis of 20.  Elevated bilirubin slightly higher than prior but patient denying any abdominal pain.  Hemoglobin of 9 which is around his baseline.  EKG without any ischemic findings.  Chest x-ray without any acute findings, no concern for infiltrate.  He has been afebrile here.  He was given 2 doses of Dilaudid, dose of Toradol and Oxy IR as well as IV fluids but continues to have pain.  He does not feel that he can manage his pain at home.  I suspect that his symptoms are due to his sickle cell pain crisis.  No evidence of acute chest.  He is not hypoxic here.  Will admit for further pain control.    Portions of this note were generated with [employer]. Dictation errors may occur despite best attempts at proofreading.  Final Clinical Impression(s) / ED Diagnoses Final diagnoses:  Sickle cell pain crisis [facility])    Rx / DC Orders ED Discharge Orders     None        Delia Heady, PA-C 02/01/21 2019    Malvin Johns, MD 02/02/21 [phone]

## 2021-02-01 NOTE — ED Triage Notes (Signed)
Arrives vis EMS for sickle cell pain x1 day, reports generalized pain. Vitals are WNL.

## 2021-02-02 DIAGNOSIS — E876 Hypokalemia: Secondary | ICD-10-CM | POA: Diagnosis not present

## 2021-02-02 DIAGNOSIS — D571 Sickle-cell disease without crisis: Secondary | ICD-10-CM | POA: Diagnosis not present

## 2021-02-02 DIAGNOSIS — D638 Anemia in other chronic diseases classified elsewhere: Secondary | ICD-10-CM | POA: Diagnosis not present

## 2021-02-02 DIAGNOSIS — G894 Chronic pain syndrome: Secondary | ICD-10-CM

## 2021-02-02 LAB — CMP14+CBC/D/PLT+FER+RETIC+V...
ALT: 15 IU/L (ref 0–44)
AST: 34 IU/L (ref 0–40)
Albumin/Globulin Ratio: 1.3 (ref 1.2–2.2)
Albumin: 5 g/dL (ref 4.0–5.0)
Alkaline Phosphatase: 74 IU/L (ref 44–121)
BUN/Creatinine Ratio: 10 (ref 9–20)
BUN: 8 mg/dL (ref 6–20)
Basophils Absolute: 0.1 10*3/uL (ref 0.0–0.2)
Basos: 1 %
Bilirubin Total: 2.9 mg/dL — ABNORMAL HIGH (ref 0.0–1.2)
CO2: 23 mmol/L (ref 20–29)
Calcium: 9.9 mg/dL (ref 8.7–10.2)
Chloride: 101 mmol/L (ref 96–106)
Creatinine, Ser: 0.78 mg/dL (ref 0.76–1.27)
EOS (ABSOLUTE): 0.2 10*3/uL (ref 0.0–0.4)
Eos: 2 %
Ferritin: 334 ng/mL (ref 30–400)
Globulin, Total: 3.9 g/dL (ref 1.5–4.5)
Glucose: 88 mg/dL (ref 65–99)
Hematocrit: 25.8 % — ABNORMAL LOW (ref 37.5–51.0)
Hemoglobin: 9 g/dL — ABNORMAL LOW (ref 13.0–17.7)
Immature Grans (Abs): 0.5 10*3/uL — ABNORMAL HIGH (ref 0.0–0.1)
Immature Granulocytes: 5 %
Lymphocytes Absolute: 4.2 10*3/uL — ABNORMAL HIGH (ref 0.7–3.1)
Lymphs: 43 %
MCH: 30.8 pg (ref 26.6–33.0)
MCHC: 34.9 g/dL (ref 31.5–35.7)
MCV: 88 fL (ref 79–97)
Monocytes Absolute: 0.5 10*3/uL (ref 0.1–0.9)
Monocytes: 5 %
NRBC: 6 % — ABNORMAL HIGH (ref 0–0)
Neutrophils Absolute: 4.2 10*3/uL (ref 1.4–7.0)
Neutrophils: 44 %
Platelets: 341 10*3/uL (ref 150–450)
Potassium: 4.6 mmol/L (ref 3.5–5.2)
RBC: 2.92 x10E6/uL — ABNORMAL LOW (ref 4.14–5.80)
RDW: 19.7 % — ABNORMAL HIGH (ref 11.6–15.4)
Retic Ct Pct: 10.7 % — ABNORMAL HIGH (ref 0.6–2.6)
Sodium: 140 mmol/L (ref 134–144)
Total Protein: 8.9 g/dL — ABNORMAL HIGH (ref 6.0–8.5)
Vit D, 25-Hydroxy: 11.4 ng/mL — ABNORMAL LOW (ref 30.0–100.0)
WBC: 9.6 10*3/uL (ref 3.4–10.8)
eGFR: 117 mL/min/{1.73_m2} (ref >59)

## 2021-02-02 LAB — COMPREHENSIVE METABOLIC PANEL
ALT: 23 U/L (ref 0–44)
AST: 44 U/L — ABNORMAL HIGH (ref 15–41)
Albumin: 4.6 g/dL (ref 3.5–5.0)
Alkaline Phosphatase: 50 U/L (ref 38–126)
Anion gap: 7 (ref 5–15)
BUN: 11 mg/dL (ref 6–20)
CO2: 26 mmol/L (ref 22–32)
Calcium: 9.2 mg/dL (ref 8.9–10.3)
Chloride: 107 mmol/L (ref 98–111)
Creatinine, Ser: 0.77 mg/dL (ref 0.61–1.24)
GFR, Estimated: >60 mL/min (ref >60)
Glucose, Bld: 123 mg/dL — ABNORMAL HIGH (ref 70–99)
Potassium: 4 mmol/L (ref 3.5–5.1)
Sodium: 140 mmol/L (ref 135–145)
Total Bilirubin: 3.8 mg/dL — ABNORMAL HIGH (ref 0.3–1.2)
Total Protein: 8.5 g/dL — ABNORMAL HIGH (ref 6.5–8.1)

## 2021-02-02 LAB — CBC WITH DIFFERENTIAL/PLATELET
Abs Immature Granulocytes: 0.25 10*3/uL — ABNORMAL HIGH (ref 0.00–0.07)
Basophils Absolute: 0.1 10*3/uL (ref 0.0–0.1)
Basophils Relative: 0 %
Eosinophils Absolute: 0 10*3/uL (ref 0.0–0.5)
Eosinophils Relative: 0 %
HCT: 22.6 % — ABNORMAL LOW (ref 39.0–52.0)
Hemoglobin: 8.1 g/dL — ABNORMAL LOW (ref 13.0–17.0)
Immature Granulocytes: 1 %
Lymphocytes Relative: 11 %
Lymphs Abs: 2 10*3/uL (ref 0.7–4.0)
MCH: 31.3 pg (ref 26.0–34.0)
MCHC: 35.8 g/dL (ref 30.0–36.0)
MCV: 87.3 fL (ref 80.0–100.0)
Monocytes Absolute: 1.1 10*3/uL — ABNORMAL HIGH (ref 0.1–1.0)
Monocytes Relative: 6 %
Neutro Abs: 14.1 10*3/uL — ABNORMAL HIGH (ref 1.7–7.7)
Neutrophils Relative %: 82 %
Platelets: 276 10*3/uL (ref 150–400)
RBC: 2.59 MIL/uL — ABNORMAL LOW (ref 4.22–5.81)
RDW: 20.3 % — ABNORMAL HIGH (ref 11.5–15.5)
WBC: 17.4 10*3/uL — ABNORMAL HIGH (ref 4.0–10.5)
nRBC: 9 % — ABNORMAL HIGH (ref 0.0–0.2)

## 2021-02-02 LAB — CREATININE, SERUM
Creatinine, Ser: 0.71 mg/dL (ref 0.61–1.24)
GFR, Estimated: >60 mL/min (ref >60)

## 2021-02-02 MED ORDER — DIPHENHYDRAMINE HCL 50 MG/ML IJ SOLN: 12.5000 mg | Freq: Four times a day (QID) | INTRAMUSCULAR | Status: DC | PRN

## 2021-02-02 MED ORDER — HYDROXYUREA 500 MG PO CAPS
500.0000 mg | ORAL_CAPSULE | Freq: Two times a day (BID) | ORAL | Status: DC
  Administered 2021-02-02: 500 mg via ORAL
  Filled 2021-02-02 (×2): qty 1

## 2021-02-02 MED ORDER — VITAMIN D 25 MCG (1000 UNIT) PO TABS
1000.0000 [IU] | ORAL_TABLET | Freq: Every day | ORAL | Status: DC
  Administered 2021-02-02 – 2021-02-06 (×5): 1000 [IU] via ORAL
  Filled 2021-02-02 (×5): qty 1

## 2021-02-02 MED ORDER — DIPHENHYDRAMINE HCL 12.5 MG/5ML PO ELIX: 12.5000 mg | ORAL_SOLUTION | Freq: Four times a day (QID) | ORAL | Status: DC | PRN

## 2021-02-02 MED ORDER — SENNOSIDES-DOCUSATE SODIUM 8.6-50 MG PO TABS
1.0000 | ORAL_TABLET | Freq: Two times a day (BID) | ORAL | Status: DC
  Administered 2021-02-02 – 2021-02-06 (×8): 1 via ORAL
  Filled 2021-02-02 (×9): qty 1

## 2021-02-02 MED ORDER — SODIUM CHLORIDE 0.9% FLUSH: 9.0000 mL | INTRAVENOUS | Status: DC | PRN

## 2021-02-02 MED ORDER — ONDANSETRON HCL 4 MG/2ML IJ SOLN: 4.0000 mg | Freq: Four times a day (QID) | INTRAMUSCULAR | Status: DC | PRN

## 2021-02-02 MED ORDER — FOLIC ACID 1 MG PO TABS
0.5000 mg | ORAL_TABLET | Freq: Every day | ORAL | Status: DC
  Administered 2021-02-02 – 2021-02-06 (×5): 0.5 mg via ORAL
  Filled 2021-02-02 (×5): qty 1

## 2021-02-02 MED ORDER — HYDROMORPHONE 1 MG/ML IV SOLN
INTRAVENOUS | Status: DC
Start: 2021-02-02 — End: 2021-02-05
  Administered 2021-02-02: 3.5 mg via INTRAVENOUS
  Administered 2021-02-02: 30 mg via INTRAVENOUS
  Administered 2021-02-02: 1.5 mg via INTRAVENOUS
  Administered 2021-02-02: 7.5 mg via INTRAVENOUS
  Administered 2021-02-03 (×3): 2 mg via INTRAVENOUS
  Administered 2021-02-03 (×2): 1.5 mg via INTRAVENOUS
  Administered 2021-02-03: 0.5 mg via INTRAVENOUS
  Administered 2021-02-03: 2.5 mg via INTRAVENOUS
  Administered 2021-02-04: 1 mg via INTRAVENOUS
  Administered 2021-02-04: 30 mg via INTRAVENOUS
  Administered 2021-02-04: 1 mg via INTRAVENOUS
  Administered 2021-02-04: 0.5 mg via INTRAVENOUS
  Administered 2021-02-05: 0 mg via INTRAVENOUS
  Administered 2021-02-05: 1 mg via INTRAVENOUS
  Filled 2021-02-02 (×2): qty 30

## 2021-02-02 MED ORDER — NALOXONE HCL 0.4 MG/ML IJ SOLN: 0.4000 mg | INTRAMUSCULAR | Status: DC | PRN

## 2021-02-02 MED ORDER — POLYETHYLENE GLYCOL 3350 17 G PO PACK: 17.0000 g | PACK | Freq: Every day | ORAL | Status: DC | PRN

## 2021-02-02 MED ORDER — ENOXAPARIN SODIUM 40 MG/0.4ML IJ SOSY
40.0000 mg | PREFILLED_SYRINGE | INTRAMUSCULAR | Status: DC
  Administered 2021-02-02 – 2021-02-05 (×4): 40 mg via SUBCUTANEOUS
  Filled 2021-02-02 (×5): qty 0.4

## 2021-02-02 MED ORDER — DEXTROSE-NACL 5-0.45 % IV SOLN: INTRAVENOUS | Status: DC

## 2021-02-02 MED ORDER — KETOROLAC TROMETHAMINE 30 MG/ML IJ SOLN
30.0000 mg | Freq: Four times a day (QID) | INTRAMUSCULAR | Status: DC
  Administered 2021-02-02 – 2021-02-06 (×18): 30 mg via INTRAVENOUS
  Filled 2021-02-02 (×17): qty 1

## 2021-02-02 NOTE — Progress Notes (Signed)
PHARMACY BRIEF NOTE: HYDROXYUREA   By [facility] policy, hydroxyurea is automatically held when any of the following laboratory values occur:  ANC < 2 K  Pltc < 80K in sickle-cell patients; < 100K in other patients  Hgb <= 6 in sickle-cell patients; < 8 in other patients  Reticulocytes < 80K when Hgb < 9  Hydroxyurea has been held (discontinued from profile) per policy.   Lenis Noon, PharmD 02/02/21 10:08 AM

## 2021-02-03 DIAGNOSIS — D5700 Hb-SS disease with crisis, unspecified: Principal | ICD-10-CM

## 2021-02-03 LAB — CBC
HCT: 19.6 % — ABNORMAL LOW (ref 39.0–52.0)
Hemoglobin: 7 g/dL — ABNORMAL LOW (ref 13.0–17.0)
MCH: 30.7 pg (ref 26.0–34.0)
MCHC: 35.7 g/dL (ref 30.0–36.0)
MCV: 86 fL (ref 80.0–100.0)
Platelets: 220 10*3/uL (ref 150–400)
RBC: 2.28 MIL/uL — ABNORMAL LOW (ref 4.22–5.81)
RDW: 19.8 % — ABNORMAL HIGH (ref 11.5–15.5)
WBC: 14 10*3/uL — ABNORMAL HIGH (ref 4.0–10.5)
nRBC: 9.8 % — ABNORMAL HIGH (ref 0.0–0.2)

## 2021-02-03 LAB — URINE CULTURE

## 2021-02-03 MED ORDER — OXYCODONE HCL 5 MG PO TABS
15.0000 mg | ORAL_TABLET | ORAL | Status: DC | PRN
  Administered 2021-02-03 – 2021-02-06 (×5): 15 mg via ORAL
  Filled 2021-02-03 (×5): qty 3

## 2021-02-03 NOTE — Progress Notes (Addendum)
Subjective: [name] is a 40 year old male with a medical history significant for sickle cell disease, chronic pain syndrome, opiate dependence and tolerance, and history of anemia of chronic disease that was admitted for sickle cell pain crisis. Patient typically has infrequent pain crises.  He states that lately pain has not been controlled on home opiate medication regimen.  He has been taking Percocet 10-325 mg for around 10 years.  He says that pain is primarily to central chest, low back, and lower extremities.  He has not been able to ambulate to the restroom without assistance.  He characterizes pain as constant and occasionally sharp.  He denies any headache, chest pain, urinary symptoms, nausea, vomiting, or diarrhea.  Objective:  Vital signs in last 24 hours:  Vitals:   02/03/21 1006 02/03/21 1209 02/03/21 1548 02/03/21 1659  BP: 123/70  112/73   Pulse: 89  84   Resp: 20 18 18 18   Temp: 98.5 F (36.9 C)  100 F (37.8 C)   TempSrc: Oral  Oral   SpO2: 90% 94% 90% 92%  Weight:      Height:        Intake/Output from previous day:   Intake/Output Summary (Last 24 hours) at 02/03/2021 1715 Last data filed at 02/03/2021 0404 Gross per 24 hour  Intake --  Output 900 ml  Net -900 ml    Physical Exam: General: Alert, awake, oriented x3, in no acute distress.  HEENT: [place]/AT PEERL, EOMI Neck: Trachea midline,  no masses, no thyromegal,y no JVD, no carotid bruit OROPHARYNX:  Moist, No exudate/ erythema/lesions.  Heart: Regular rate and rhythm, without murmurs, rubs, gallops, PMI non-displaced, no heaves or thrills on palpation.  Lungs: Clear to auscultation, no wheezing or rhonchi noted. No increased vocal fremitus resonant to percussion  Abdomen: Soft, nontender, nondistended, positive bowel sounds, no masses no hepatosplenomegaly noted..  Neuro: No focal neurological deficits noted cranial nerves II through XII grossly intact. DTRs 2+ bilaterally upper and lower  extremities. Strength 5 out of 5 in bilateral upper and lower extremities. Musculoskeletal: No warm swelling or erythema around joints, no spinal tenderness noted. Psychiatric: Patient alert and oriented x3, good insight and cognition, good recent to remote recall. Lymph node survey: No cervical axillary or inguinal lymphadenopathy noted.  Lab Results:  Basic Metabolic Panel:    Component Value Date/Time   NA 140 02/02/2021 0632   NA 140 02/01/2021 1505   K 4.0 02/02/2021 0632   CL 107 02/02/2021 0632   CO2 26 02/02/2021 0632   BUN 11 02/02/2021 0632   BUN 8 02/01/2021 1505   CREATININE 0.71 02/02/2021 0632   CREATININE 0.77 02/02/2021 0632   CREATININE 0.76 03/08/2017 1047   GLUCOSE 123 (H) 02/02/2021 0632   CALCIUM 9.2 02/02/2021 0632   CBC:    Component Value Date/Time   WBC 14.0 (H) 02/03/2021 0908   HGB 7.0 (L) 02/03/2021 0908   HGB 9.0 (L) 02/01/2021 1505   HCT 19.6 (L) 02/03/2021 0908   HCT 25.8 (L) 02/01/2021 1505   PLT 220 02/03/2021 0908   PLT 341 02/01/2021 1505   MCV 86.0 02/03/2021 0908   MCV 88 02/01/2021 1505   NEUTROABS 14.1 (H) 02/02/2021 0632   NEUTROABS 4.2 02/01/2021 1505   LYMPHSABS 2.0 02/02/2021 0632   LYMPHSABS 4.2 (H) 02/01/2021 1505   MONOABS 1.1 (H) 02/02/2021 0632   EOSABS 0.0 02/02/2021 0632   EOSABS 0.2 02/01/2021 1505   BASOSABS 0.1 02/02/2021 0632   BASOSABS 0.1 02/01/2021  1505    Recent Results (from the past 240 hour(s))  Urine Culture     Status: None   Collection Time: 02/01/21  3:51 PM   Specimen: Urine   UR  Result Value Ref Range Status   Urine Culture, Routine Final report  Final   Organism ID, Bacteria Comment  Final    Comment: Culture shows less than 10,000 colony forming units of bacteria per milliliter of urine. This colony count is not generally considered to be clinically significant.   Resp Panel by RT-PCR (Flu A&B, Covid) Nasopharyngeal Swab     Status: None   Collection Time: 02/01/21  8:16 PM   Specimen:  Nasopharyngeal Swab; Nasopharyngeal(NP) swabs in vial transport medium  Result Value Ref Range Status   SARS Coronavirus 2 by RT PCR NEGATIVE NEGATIVE Final    Comment: (NOTE) SARS-CoV-2 target nucleic acids are NOT DETECTED.  The SARS-CoV-2 RNA is generally detectable in upper respiratory specimens during the acute phase of infection. The lowest concentration of SARS-CoV-2 viral copies this assay can detect is 138 copies/mL. A negative result does not preclude SARS-Cov-2 infection and should not be used as the sole basis for treatment or other patient management decisions. A negative result may occur with  improper specimen collection/handling, submission of specimen other than nasopharyngeal swab, presence of viral mutation(s) within the areas targeted by this assay, and inadequate number of viral copies(<138 copies/mL). A negative result must be combined with clinical observations, patient history, and epidemiological information. The expected result is Negative.  Fact Sheet for Patients:  EntrepreneurPulse.com.au  Fact Sheet for Healthcare Providers:  IncredibleEmployment.be  This test is no t yet approved or cleared by the [place] FDA and  has been authorized for detection and/or diagnosis of SARS-CoV-2 by FDA under an Emergency Use Authorization (EUA). This EUA will remain  in effect (meaning this test can be used) for the duration of the COVID-19 declaration under Section 564(b)(1) of the Act, 21 U.S.C.section 360bbb-3(b)(1), unless the authorization is terminated  or revoked sooner.       Influenza A by PCR NEGATIVE NEGATIVE Final   Influenza B by PCR NEGATIVE NEGATIVE Final    Comment: (NOTE) The Xpert Xpress SARS-CoV-2/FLU/RSV plus assay is intended as an aid in the diagnosis of influenza from Nasopharyngeal swab specimens and should not be used as a sole basis for treatment. Nasal washings and aspirates are unacceptable for  Xpert Xpress SARS-CoV-2/FLU/RSV testing.  Fact Sheet for Patients: EntrepreneurPulse.com.au  Fact Sheet for Healthcare Providers: IncredibleEmployment.be  This test is not yet approved or cleared by the [place] FDA and has been authorized for detection and/or diagnosis of SARS-CoV-2 by FDA under an Emergency Use Authorization (EUA). This EUA will remain in effect (meaning this test can be used) for the duration of the COVID-19 declaration under Section 564(b)(1) of the Act, 21 U.S.C. section 360bbb-3(b)(1), unless the authorization is terminated or revoked.  Performed at [facility], Between [address], [place], [place] [address]     Studies/Results: DG Chest Portable 1 View  Result Date: 02/01/2021 CLINICAL DATA:  Chest pain with history of sickle cell EXAM: PORTABLE CHEST 1 VIEW COMPARISON:  09/13/2018 FINDINGS: Cardiac shadow is within normal limits. The lungs are well aerated bilaterally. No focal infiltrate or sizable effusion is seen. No bony abnormality is seen. IMPRESSION: No active disease. Electronically Signed   By: Inez Catalina M.D.   On: 02/01/2021 20:05    Medications: Scheduled Meds:  cholecalciferol  1,000  Units Oral Daily   enoxaparin (LOVENOX) injection  40 mg Subcutaneous P92T   folic acid  0.5 mg Oral Daily   HYDROmorphone   Intravenous Q4H   ketorolac  30 mg Intravenous Q6H   senna-docusate  1 tablet Oral BID   Continuous Infusions:  dextrose 5 % and 0.45% NaCl 10 mL/hr at 02/03/21 0606   PRN Meds:.diphenhydrAMINE **OR** diphenhydrAMINE, naloxone **AND** sodium chloride flush, ondansetron (ZOFRAN) IV, oxyCODONE, polyethylene glycol  Consultants: None  Procedures: None  Antibiotics: None  Assessment/Plan: Active Problems:   Chronic pain syndrome   Hb-SS disease without crisis (HCC)   Hypokalemia   Anemia of chronic disease   Leukocytosis   Sickle cell disease with crisis  ([place])  Sickle cell disease with pain crisis: Decrease IV fluid to KVO Continue IV Dilaudid PCA without any changes in settings Toradol 15 mg IV every 6 hours for total of 5 days Monitor vital signs very closely, reevaluate pain scale regularly, and supplemental oxygen as needed  Chronic pain syndrome: Hold home Percocet, use IV Dilaudid PCA  Leukocytosis: Improving.  Secondary to vaso-occlusive crisis.  Patient is afebrile without any signs of acute infection Continue to monitor closely without antibiotics, CBC in a.m.  Hypokalemia: Resolved.  No repletion warranted at this time.  Continue to follow closely.  Anemia of chronic disease: Hemoglobin is stable and consistent with patient's baseline.  There is no clinical indication for blood transfusion at this time.  Follow CBC in AM.  Code Status: Full Code Family Communication: N/A Disposition Plan: Not yet ready for discharge  [place], MSN, FNP-C Patient [address] [place], [place] [address] [phone]  If 5PM-8AM, please contact night-coverage.  02/03/2021, 5:15 PM  LOS: 2 days

## 2021-02-03 NOTE — Progress Notes (Addendum)
Subjective: [name] is a 40 year old male with a medical history significant for sickle cell disease, chronic pain syndrome, opiate dependence and tolerance, and history of anemia of chronic disease was admitted for sickle cell pain crisis. Today, patient continues to complain of pain primarily to low back, central chest, and lower extremities.  He rates his pain as 7/10.  He denies any headache, fever, shortness of breath, dizziness, nausea, vomiting, or diarrhea.  Objective:  Vital signs in last 24 hours:  Vitals:   02/03/21 1006 02/03/21 1209 02/03/21 1548 02/03/21 1659  BP: 123/70  112/73   Pulse: 89  84   Resp: 20 18 18 18   Temp: 98.5 F (36.9 C)  100 F (37.8 C)   TempSrc: Oral  Oral   SpO2: 90% 94% 90% 92%  Weight:      Height:        Intake/Output from previous day:   Intake/Output Summary (Last 24 hours) at 02/03/2021 1710 Last data filed at 02/03/2021 0404 Gross per 24 hour  Intake --  Output 900 ml  Net -900 ml    Physical Exam: General: Alert, awake, oriented x3, in no acute distress.  HEENT: [place]/AT PEERL, EOMI Neck: Trachea midline,  no masses, no thyromegal,y no JVD, no carotid bruit OROPHARYNX:  Moist, No exudate/ erythema/lesions.  Heart: Regular rate and rhythm, without murmurs, rubs, gallops, PMI non-displaced, no heaves or thrills on palpation.  Lungs: Clear to auscultation, no wheezing or rhonchi noted. No increased vocal fremitus resonant to percussion  Abdomen: Soft, nontender, nondistended, positive bowel sounds, no masses no hepatosplenomegaly noted..  Neuro: No focal neurological deficits noted cranial nerves II through XII grossly intact. DTRs 2+ bilaterally upper and lower extremities. Strength 5 out of 5 in bilateral upper and lower extremities. Musculoskeletal: No warm swelling or erythema around joints, no spinal tenderness noted. Psychiatric: Patient alert and oriented x3, good insight and cognition, good recent to remote recall. Lymph  node survey: No cervical axillary or inguinal lymphadenopathy noted.  Lab Results:  Basic Metabolic Panel:    Component Value Date/Time   NA 140 02/02/2021 0632   NA 140 02/01/2021 1505   K 4.0 02/02/2021 0632   CL 107 02/02/2021 0632   CO2 26 02/02/2021 0632   BUN 11 02/02/2021 0632   BUN 8 02/01/2021 1505   CREATININE 0.71 02/02/2021 0632   CREATININE 0.77 02/02/2021 0632   CREATININE 0.76 03/08/2017 1047   GLUCOSE 123 (H) 02/02/2021 0632   CALCIUM 9.2 02/02/2021 0632   CBC:    Component Value Date/Time   WBC 14.0 (H) 02/03/2021 0908   HGB 7.0 (L) 02/03/2021 0908   HGB 9.0 (L) 02/01/2021 1505   HCT 19.6 (L) 02/03/2021 0908   HCT 25.8 (L) 02/01/2021 1505   PLT 220 02/03/2021 0908   PLT 341 02/01/2021 1505   MCV 86.0 02/03/2021 0908   MCV 88 02/01/2021 1505   NEUTROABS 14.1 (H) 02/02/2021 0632   NEUTROABS 4.2 02/01/2021 1505   LYMPHSABS 2.0 02/02/2021 0632   LYMPHSABS 4.2 (H) 02/01/2021 1505   MONOABS 1.1 (H) 02/02/2021 0632   EOSABS 0.0 02/02/2021 0632   EOSABS 0.2 02/01/2021 1505   BASOSABS 0.1 02/02/2021 0632   BASOSABS 0.1 02/01/2021 1505    Recent Results (from the past 240 hour(s))  Urine Culture     Status: None   Collection Time: 02/01/21  3:51 PM   Specimen: Urine   UR  Result Value Ref Range Status   Urine Culture, Routine Final  report  Final   Organism ID, Bacteria Comment  Final    Comment: Culture shows less than 10,000 colony forming units of bacteria per milliliter of urine. This colony count is not generally considered to be clinically significant.   Resp Panel by RT-PCR (Flu A&B, Covid) Nasopharyngeal Swab     Status: None   Collection Time: 02/01/21  8:16 PM   Specimen: Nasopharyngeal Swab; Nasopharyngeal(NP) swabs in vial transport medium  Result Value Ref Range Status   SARS Coronavirus 2 by RT PCR NEGATIVE NEGATIVE Final    Comment: (NOTE) SARS-CoV-2 target nucleic acids are NOT DETECTED.  The SARS-CoV-2 RNA is generally detectable  in upper respiratory specimens during the acute phase of infection. The lowest concentration of SARS-CoV-2 viral copies this assay can detect is 138 copies/mL. A negative result does not preclude SARS-Cov-2 infection and should not be used as the sole basis for treatment or other patient management decisions. A negative result may occur with  improper specimen collection/handling, submission of specimen other than nasopharyngeal swab, presence of viral mutation(s) within the areas targeted by this assay, and inadequate number of viral copies(<138 copies/mL). A negative result must be combined with clinical observations, patient history, and epidemiological information. The expected result is Negative.  Fact Sheet for Patients:  EntrepreneurPulse.com.au  Fact Sheet for Healthcare Providers:  IncredibleEmployment.be  This test is no t yet approved or cleared by the [place] FDA and  has been authorized for detection and/or diagnosis of SARS-CoV-2 by FDA under an Emergency Use Authorization (EUA). This EUA will remain  in effect (meaning this test can be used) for the duration of the COVID-19 declaration under Section 564(b)(1) of the Act, 21 U.S.C.section 360bbb-3(b)(1), unless the authorization is terminated  or revoked sooner.       Influenza A by PCR NEGATIVE NEGATIVE Final   Influenza B by PCR NEGATIVE NEGATIVE Final    Comment: (NOTE) The Xpert Xpress SARS-CoV-2/FLU/RSV plus assay is intended as an aid in the diagnosis of influenza from Nasopharyngeal swab specimens and should not be used as a sole basis for treatment. Nasal washings and aspirates are unacceptable for Xpert Xpress SARS-CoV-2/FLU/RSV testing.  Fact Sheet for Patients: EntrepreneurPulse.com.au  Fact Sheet for Healthcare Providers: IncredibleEmployment.be  This test is not yet approved or cleared by the [place] FDA and has  been authorized for detection and/or diagnosis of SARS-CoV-2 by FDA under an Emergency Use Authorization (EUA). This EUA will remain in effect (meaning this test can be used) for the duration of the COVID-19 declaration under Section 564(b)(1) of the Act, 21 U.S.C. section 360bbb-3(b)(1), unless the authorization is terminated or revoked.  Performed at [facility], [place] [address] [place], [place] [address]     Studies/Results: DG Chest Portable 1 View  Result Date: 02/01/2021 CLINICAL DATA:  Chest pain with history of sickle cell EXAM: PORTABLE CHEST 1 VIEW COMPARISON:  09/13/2018 FINDINGS: Cardiac shadow is within normal limits. The lungs are well aerated bilaterally. No focal infiltrate or sizable effusion is seen. No bony abnormality is seen. IMPRESSION: No active disease. Electronically Signed   By: Inez Catalina M.D.   On: 02/01/2021 20:05    Medications: Scheduled Meds:  cholecalciferol  1,000 Units Oral Daily   enoxaparin (LOVENOX) injection  40 mg Subcutaneous Y30Z   folic acid  0.5 mg Oral Daily   HYDROmorphone   Intravenous Q4H   ketorolac  30 mg Intravenous Q6H   senna-docusate  1 tablet Oral BID  Continuous Infusions:  dextrose 5 % and 0.45% NaCl 10 mL/hr at 02/03/21 0606   PRN Meds:.diphenhydrAMINE **OR** diphenhydrAMINE, naloxone **AND** sodium chloride flush, ondansetron (ZOFRAN) IV, oxyCODONE, polyethylene glycol  Consultants: None  Procedures: None  Antibiotics: None  Assessment/Plan: Active Problems:   Chronic pain syndrome   Hb-SS disease without crisis (HCC)   Hypokalemia   Anemia of chronic disease   Leukocytosis   Sickle cell disease with crisis ([place])  Sickle cell disease with pain crisis: Continue IV Dilaudid PCA Initiate oxycodone 15 mg every 4 hours as needed for severe breakthrough pain Toradol 15 mg IV every 6 hours Decrease IV fluids to [facility] Monitor vital signs very closely, reevaluate pain scale regularly, and  supplemental oxygen as needed.  Hypokalemia: Resolved.  Continue to follow closely.  Labs in AM.  Leukocytosis: WBC stable.  Secondary to vaso-occlusive crisis.  Patient is afebrile without any signs of acute infection.  Continue to follow closely without antibiotics.  Anemia of chronic disease: Hemoglobin 8.2, consistent with patient's baseline.  There is no clinical indication for blood transfusion at this time.  Continue to follow closely.   Chronic pain syndrome: Oxycodone increased to 15 mg every 4 hours as needed  Code Status: Full Code Family Communication: N/A Disposition Plan: Not yet ready for discharge   Ben[place], MSN, FNP-C Patient [place] [address] [place], [place] [address] [phone]  If 5PM-8AM, please contact night-coverage.  02/03/2021, 5:10 PM  LOS: 2 days

## 2021-02-04 DIAGNOSIS — D5700 Hb-SS disease with crisis, unspecified: Secondary | ICD-10-CM | POA: Diagnosis not present

## 2021-02-04 LAB — BASIC METABOLIC PANEL
Anion gap: 7 (ref 5–15)
BUN: 10 mg/dL (ref 6–20)
CO2: 26 mmol/L (ref 22–32)
Calcium: 8.9 mg/dL (ref 8.9–10.3)
Chloride: 102 mmol/L (ref 98–111)
Creatinine, Ser: 0.88 mg/dL (ref 0.61–1.24)
GFR, Estimated: >60 mL/min (ref >60)
Glucose, Bld: 131 mg/dL — ABNORMAL HIGH (ref 70–99)
Potassium: 3.2 mmol/L — ABNORMAL LOW (ref 3.5–5.1)
Sodium: 135 mmol/L (ref 135–145)

## 2021-02-04 LAB — CBC
HCT: 18.9 % — ABNORMAL LOW (ref 39.0–52.0)
Hemoglobin: 6.9 g/dL — CL (ref 13.0–17.0)
MCH: 31.7 pg (ref 26.0–34.0)
MCHC: 36.5 g/dL — ABNORMAL HIGH (ref 30.0–36.0)
MCV: 86.7 fL (ref 80.0–100.0)
Platelets: 248 10*3/uL (ref 150–400)
RBC: 2.18 MIL/uL — ABNORMAL LOW (ref 4.22–5.81)
RDW: 18.7 % — ABNORMAL HIGH (ref 11.5–15.5)
WBC: 13.1 10*3/uL — ABNORMAL HIGH (ref 4.0–10.5)
nRBC: 6.2 % — ABNORMAL HIGH (ref 0.0–0.2)

## 2021-02-04 LAB — PREPARE RBC (CROSSMATCH)

## 2021-02-04 MED ORDER — DIPHENHYDRAMINE HCL 50 MG/ML IJ SOLN
25.0000 mg | Freq: Once | INTRAMUSCULAR | Status: AC
  Administered 2021-02-04: 25 mg via INTRAVENOUS
  Filled 2021-02-04: qty 1

## 2021-02-04 MED ORDER — ACETAMINOPHEN 325 MG PO TABS
650.0000 mg | ORAL_TABLET | Freq: Once | ORAL | Status: AC
  Administered 2021-02-04: 650 mg via ORAL
  Filled 2021-02-04: qty 2

## 2021-02-04 NOTE — Progress Notes (Signed)
Subjective: [name] is a 40 year old male with a medical history significant for sickle cell disease, chronic pain syndrome, opiate dependence and tolerance, and history of anemia of chronic disease was admitted for sickle cell pain crisis. Today, patient's hemoglobin has decreased to 6.9, which is below his baseline.  He endorses some fatigue.  He denies dizziness, headache, chest pain, shortness of breath, urinary symptoms, nausea, vomiting, or diarrhea.  He continues to endorse pain primarily to low back and rates pain as 5/10.  Patient is not maximizing IV Dilaudid PCA at this time.  Objective:  Vital signs in last 24 hours:  Vitals:   02/04/21 0634 02/04/21 0745 02/04/21 0959 02/04/21 1345  BP:   116/66 112/63  Pulse:   76 74  Resp: 16  18 17   Temp:  99.2 F (37.3 C) 99.3 F (37.4 C) 98.9 F (37.2 C)  TempSrc:  Oral Oral Oral  SpO2: 94%  96% 96%  Weight:      Height:        Intake/Output from previous day:   Intake/Output Summary (Last 24 hours) at 02/04/2021 1409 Last data filed at 02/04/2021 0347 Gross per 24 hour  Intake 1484.89 ml  Output 800 ml  Net 684.89 ml    Physical Exam: General: Alert, awake, oriented x3, in no acute distress.  HEENT: [place]/AT PEERL, EOMI Neck: Trachea midline,  no masses, no thyromegal,y no JVD, no carotid bruit OROPHARYNX:  Moist, No exudate/ erythema/lesions.  Heart: Regular rate and rhythm, without murmurs, rubs, gallops, PMI non-displaced, no heaves or thrills on palpation.  Lungs: Clear to auscultation, no wheezing or rhonchi noted. No increased vocal fremitus resonant to percussion  Abdomen: Soft, nontender, nondistended, positive bowel sounds, no masses no hepatosplenomegaly noted..  Neuro: No focal neurological deficits noted cranial nerves II through XII grossly intact. DTRs 2+ bilaterally upper and lower extremities. Strength 5 out of 5 in bilateral upper and lower extremities. Musculoskeletal: No warm swelling or erythema  around joints, no spinal tenderness noted. Psychiatric: Patient alert and oriented x3, good insight and cognition, good recent to remote recall. Lymph node survey: No cervical axillary or inguinal lymphadenopathy noted.  Lab Results:  Basic Metabolic Panel:    Component Value Date/Time   NA 135 02/04/2021 0709   NA 140 02/01/2021 1505   K 3.2 (L) 02/04/2021 0709   CL 102 02/04/2021 0709   CO2 26 02/04/2021 0709   BUN 10 02/04/2021 0709   BUN 8 02/01/2021 1505   CREATININE 0.88 02/04/2021 0709   CREATININE 0.76 03/08/2017 1047   GLUCOSE 131 (H) 02/04/2021 0709   CALCIUM 8.9 02/04/2021 0709   CBC:    Component Value Date/Time   WBC 13.1 (H) 02/04/2021 0709   HGB 6.9 (LL) 02/04/2021 0709   HGB 9.0 (L) 02/01/2021 1505   HCT 18.9 (L) 02/04/2021 0709   HCT 25.8 (L) 02/01/2021 1505   PLT 248 02/04/2021 0709   PLT 341 02/01/2021 1505   MCV 86.7 02/04/2021 0709   MCV 88 02/01/2021 1505   NEUTROABS 14.1 (H) 02/02/2021 0632   NEUTROABS 4.2 02/01/2021 1505   LYMPHSABS 2.0 02/02/2021 0632   LYMPHSABS 4.2 (H) 02/01/2021 1505   MONOABS 1.1 (H) 02/02/2021 0632   EOSABS 0.0 02/02/2021 0632   EOSABS 0.2 02/01/2021 1505   BASOSABS 0.1 02/02/2021 0632   BASOSABS 0.1 02/01/2021 1505    Recent Results (from the past 240 hour(s))  Urine Culture     Status: None   Collection Time: 02/01/21  3:51 PM   Specimen: Urine   UR  Result Value Ref Range Status   Urine Culture, Routine Final report  Final   Organism ID, Bacteria Comment  Final    Comment: Culture shows less than 10,000 colony forming units of bacteria per milliliter of urine. This colony count is not generally considered to be clinically significant.   Resp Panel by RT-PCR (Flu A&B, Covid) Nasopharyngeal Swab     Status: None   Collection Time: 02/01/21  8:16 PM   Specimen: Nasopharyngeal Swab; Nasopharyngeal(NP) swabs in vial transport medium  Result Value Ref Range Status   SARS Coronavirus 2 by RT PCR NEGATIVE NEGATIVE  Final    Comment: (NOTE) SARS-CoV-2 target nucleic acids are NOT DETECTED.  The SARS-CoV-2 RNA is generally detectable in upper respiratory specimens during the acute phase of infection. The lowest concentration of SARS-CoV-2 viral copies this assay can detect is 138 copies/mL. A negative result does not preclude SARS-Cov-2 infection and should not be used as the sole basis for treatment or other patient management decisions. A negative result may occur with  improper specimen collection/handling, submission of specimen other than nasopharyngeal swab, presence of viral mutation(s) within the areas targeted by this assay, and inadequate number of viral copies(<138 copies/mL). A negative result must be combined with clinical observations, patient history, and epidemiological information. The expected result is Negative.  Fact Sheet for Patients:  EntrepreneurPulse.com.au  Fact Sheet for Healthcare Providers:  IncredibleEmployment.be  This test is no t yet approved or cleared by the [place] FDA and  has been authorized for detection and/or diagnosis of SARS-CoV-2 by FDA under an Emergency Use Authorization (EUA). This EUA will remain  in effect (meaning this test can be used) for the duration of the COVID-19 declaration under Section 564(b)(1) of the Act, 21 U.S.C.section 360bbb-3(b)(1), unless the authorization is terminated  or revoked sooner.       Influenza A by PCR NEGATIVE NEGATIVE Final   Influenza B by PCR NEGATIVE NEGATIVE Final    Comment: (NOTE) The Xpert Xpress SARS-CoV-2/FLU/RSV plus assay is intended as an aid in the diagnosis of influenza from Nasopharyngeal swab specimens and should not be used as a sole basis for treatment. Nasal washings and aspirates are unacceptable for Xpert Xpress SARS-CoV-2/FLU/RSV testing.  Fact Sheet for Patients: EntrepreneurPulse.com.au  Fact Sheet for Healthcare  Providers: IncredibleEmployment.be  This test is not yet approved or cleared by the [place] FDA and has been authorized for detection and/or diagnosis of SARS-CoV-2 by FDA under an Emergency Use Authorization (EUA). This EUA will remain in effect (meaning this test can be used) for the duration of the COVID-19 declaration under Section 564(b)(1) of the Act, 21 U.S.C. section 360bbb-3(b)(1), unless the authorization is terminated or revoked.  Performed at [facility], [place] [address], [place],  [address]     Studies/Results: No results found.  Medications: Scheduled Meds:  acetaminophen  650 mg Oral Once   cholecalciferol  1,000 Units Oral Daily   diphenhydrAMINE  25 mg Intravenous Once   enoxaparin (LOVENOX) injection  40 mg Subcutaneous Y78G   folic acid  0.5 mg Oral Daily   HYDROmorphone   Intravenous Q4H   ketorolac  30 mg Intravenous Q6H   senna-docusate  1 tablet Oral BID   Continuous Infusions:  dextrose 5 % and 0.45% NaCl 10 mL/hr at 02/04/21 0347   PRN Meds:.diphenhydrAMINE **OR** diphenhydrAMINE, naloxone **AND** sodium chloride flush, ondansetron (ZOFRAN) IV, oxyCODONE, polyethylene glycol  Consultants:  None  Procedures: None  Antibiotics: None  Assessment/Plan: Principal Problem:   Sickle cell disease with crisis ([place]) Active Problems:   Chronic pain syndrome   Hb-SS disease without crisis (HCC)   Hypokalemia   Anemia of chronic disease   Leukocytosis  Sickle cell disease with pain crisis: Continue IV Dilaudid PCA start weaning today Oxycodone 15 mg every 4 hours as needed for severe breakthrough pain Toradol 15 mg IV every 6 hours Continue IV fluids to [facility]  Monitor vital signs very closely scale regularly, and supplemental  Anemia of chronic disease hemoglobin is decreased to 6.9, which is below patient's baseline.  His baseline is 8-9 g/dL.  Transfuse 1 unit PRBCs today.  Follow closely.  CBC  in a.m. Chronic pain syndrome:  Leukocytosis: WBC stable considered to be secondary to vaso-occlusive crisis.  Patient is afebrile without any signs of acute infection.  Continue to follow closely without antibiotics.  CBC in AM.  Hypokalemia:  Continue to follow closely.  Replete.  Labs in AM Code Status: Full Code Family Communication: N/A Disposition Plan: Not yet ready for discharge   If 5PM-8AM, please contact night-coverage.  02/04/2021, 2:09 PM  LOS: 3 days

## 2021-02-05 LAB — BASIC METABOLIC PANEL
Anion gap: 8 (ref 5–15)
BUN: 10 mg/dL (ref 6–20)
CO2: 26 mmol/L (ref 22–32)
Calcium: 9.1 mg/dL (ref 8.9–10.3)
Chloride: 102 mmol/L (ref 98–111)
Creatinine, Ser: 0.81 mg/dL (ref 0.61–1.24)
GFR, Estimated: >60 mL/min (ref >60)
Glucose, Bld: 96 mg/dL (ref 70–99)
Potassium: 3.7 mmol/L (ref 3.5–5.1)
Sodium: 136 mmol/L (ref 135–145)

## 2021-02-05 LAB — CBC
HCT: 23.3 % — ABNORMAL LOW (ref 39.0–52.0)
HCT: 23.3 % — ABNORMAL LOW (ref 39.0–52.0)
Hemoglobin: 8.1 g/dL — ABNORMAL LOW (ref 13.0–17.0)
Hemoglobin: 8.2 g/dL — ABNORMAL LOW (ref 13.0–17.0)
MCH: 30.1 pg (ref 26.0–34.0)
MCH: 30.3 pg (ref 26.0–34.0)
MCHC: 34.8 g/dL (ref 30.0–36.0)
MCHC: 35.2 g/dL (ref 30.0–36.0)
MCV: 86 fL (ref 80.0–100.0)
MCV: 86.6 fL (ref 80.0–100.0)
Platelets: 242 10*3/uL (ref 150–400)
Platelets: 260 10*3/uL (ref 150–400)
RBC: 2.69 MIL/uL — ABNORMAL LOW (ref 4.22–5.81)
RBC: 2.71 MIL/uL — ABNORMAL LOW (ref 4.22–5.81)
RDW: 18.1 % — ABNORMAL HIGH (ref 11.5–15.5)
RDW: 18.3 % — ABNORMAL HIGH (ref 11.5–15.5)
WBC: 9.6 10*3/uL (ref 4.0–10.5)
WBC: 9.9 10*3/uL (ref 4.0–10.5)
nRBC: 5.3 % — ABNORMAL HIGH (ref 0.0–0.2)
nRBC: 5.6 % — ABNORMAL HIGH (ref 0.0–0.2)

## 2021-02-05 LAB — TYPE AND SCREEN
ABO/RH(D): O POS
Antibody Screen: NEGATIVE
Unit division: 0

## 2021-02-05 LAB — BPAM RBC
Blood Product Expiration Date: 202207172359
ISSUE DATE / TIME: 202206232127
Unit Type and Rh: 9500

## 2021-02-05 MED ORDER — HYDROMORPHONE HCL 1 MG/ML IJ SOLN
1.0000 mg | INTRAMUSCULAR | Status: DC | PRN
  Administered 2021-02-05 – 2021-02-06 (×4): 1 mg via INTRAVENOUS
  Filled 2021-02-05 (×5): qty 1

## 2021-02-05 MED ORDER — HYDROMORPHONE 1 MG/ML IV SOLN
INTRAVENOUS | Status: DC
  Administered 2021-02-05: 0.5 mg via INTRAVENOUS

## 2021-02-05 NOTE — Progress Notes (Addendum)
Subjective: [name] is a 40 year old male with a medical history significant for sickle cell disease, chronic pain syndrome, opiate dependence and tolerance, and history of anemia of chronic disease was admitted for sickle cell pain crisis. Patient is status post 1 unit PRBCs on yesterday.  Hemoglobin has improved to baseline. Patient continues to have some pain to low back and lower extremities.  He rates pain at 5/10 and does not feel as if he can manage at home at this time.  He denies any headache, chest pain, dizziness, urinary symptoms, nausea, vomiting, or diarrhea.  He appears to be resting comfortably.  Objective:  Vital signs in last 24 hours:  Vitals:   02/05/21 0034 02/05/21 0420 02/05/21 0450 02/05/21 1045  BP:   112/65 102/62  Pulse:   73 79  Resp: 16 17 14 17   Temp:   98.6 F (37 C) 98 F (36.7 C)  TempSrc:   Oral Oral  SpO2:  97% 99% 97%  Weight:      Height:        Intake/Output from previous day:   Intake/Output Summary (Last 24 hours) at 02/05/2021 1355 Last data filed at 02/05/2021 0029 Gross per 24 hour  Intake 1092.5 ml  Output --  Net 1092.5 ml    Physical Exam: General: Alert, awake, oriented x3, in no acute distress.  HEENT: [place]/AT PEERL, EOMI Neck: Trachea midline,  no masses, no thyromegal,y no JVD, no carotid bruit OROPHARYNX:  Moist, No exudate/ erythema/lesions.  Heart: Regular rate and rhythm, without murmurs, rubs, gallops, PMI non-displaced, no heaves or thrills on palpation.  Lungs: Clear to auscultation, no wheezing or rhonchi noted. No increased vocal fremitus resonant to percussion  Abdomen: Soft, nontender, nondistended, positive bowel sounds, no masses no hepatosplenomegaly noted..  Neuro: No focal neurological deficits noted cranial nerves II through XII grossly intact. DTRs 2+ bilaterally upper and lower extremities. Strength 5 out of 5 in bilateral upper and lower extremities. Musculoskeletal: No warm swelling or erythema around  joints, no spinal tenderness noted. Psychiatric: Patient alert and oriented x3, good insight and cognition, good recent to remote recall. Lymph node survey: No cervical axillary or inguinal lymphadenopathy noted.  Lab Results:  Basic Metabolic Panel:    Component Value Date/Time   NA 136 02/05/2021 0510   NA 140 02/01/2021 1505   K 3.7 02/05/2021 0510   CL 102 02/05/2021 0510   CO2 26 02/05/2021 0510   BUN 10 02/05/2021 0510   BUN 8 02/01/2021 1505   CREATININE 0.81 02/05/2021 0510   CREATININE 0.76 03/08/2017 1047   GLUCOSE 96 02/05/2021 0510   CALCIUM 9.1 02/05/2021 0510   CBC:    Component Value Date/Time   WBC 9.9 02/05/2021 0510   WBC 9.6 02/05/2021 0510   HGB 8.2 (L) 02/05/2021 0510   HGB 8.1 (L) 02/05/2021 0510   HGB 9.0 (L) 02/01/2021 1505   HCT 23.3 (L) 02/05/2021 0510   HCT 23.3 (L) 02/05/2021 0510   HCT 25.8 (L) 02/01/2021 1505   PLT 260 02/05/2021 0510   PLT 242 02/05/2021 0510   PLT 341 02/01/2021 1505   MCV 86.0 02/05/2021 0510   MCV 86.6 02/05/2021 0510   MCV 88 02/01/2021 1505   NEUTROABS 14.1 (H) 02/02/2021 0632   NEUTROABS 4.2 02/01/2021 1505   LYMPHSABS 2.0 02/02/2021 0632   LYMPHSABS 4.2 (H) 02/01/2021 1505   MONOABS 1.1 (H) 02/02/2021 0632   EOSABS 0.0 02/02/2021 0632   EOSABS 0.2 02/01/2021 1505   BASOSABS  0.1 02/02/2021 5056   BASOSABS 0.1 02/01/2021 1505    Recent Results (from the past 240 hour(s))  Urine Culture     Status: None   Collection Time: 02/01/21  3:51 PM   Specimen: Urine   UR  Result Value Ref Range Status   Urine Culture, Routine Final report  Final   Organism ID, Bacteria Comment  Final    Comment: Culture shows less than 10,000 colony forming units of bacteria per milliliter of urine. This colony count is not generally considered to be clinically significant.   Resp Panel by RT-PCR (Flu A&B, Covid) Nasopharyngeal Swab     Status: None   Collection Time: 02/01/21  8:16 PM   Specimen: Nasopharyngeal Swab;  Nasopharyngeal(NP) swabs in vial transport medium  Result Value Ref Range Status   SARS Coronavirus 2 by RT PCR NEGATIVE NEGATIVE Final    Comment: (NOTE) SARS-CoV-2 target nucleic acids are NOT DETECTED.  The SARS-CoV-2 RNA is generally detectable in upper respiratory specimens during the acute phase of infection. The lowest concentration of SARS-CoV-2 viral copies this assay can detect is 138 copies/mL. A negative result does not preclude SARS-Cov-2 infection and should not be used as the sole basis for treatment or other patient management decisions. A negative result may occur with  improper specimen collection/handling, submission of specimen other than nasopharyngeal swab, presence of viral mutation(s) within the areas targeted by this assay, and inadequate number of viral copies(<138 copies/mL). A negative result must be combined with clinical observations, patient history, and epidemiological information. The expected result is Negative.  Fact Sheet for Patients:  EntrepreneurPulse.com.au  Fact Sheet for Healthcare Providers:  IncredibleEmployment.be  This test is no t yet approved or cleared by the [place] FDA and  has been authorized for detection and/or diagnosis of SARS-CoV-2 by FDA under an Emergency Use Authorization (EUA). This EUA will remain  in effect (meaning this test can be used) for the duration of the COVID-19 declaration under Section 564(b)(1) of the Act, 21 U.S.C.section 360bbb-3(b)(1), unless the authorization is terminated  or revoked sooner.       Influenza A by PCR NEGATIVE NEGATIVE Final   Influenza B by PCR NEGATIVE NEGATIVE Final    Comment: (NOTE) The Xpert Xpress SARS-CoV-2/FLU/RSV plus assay is intended as an aid in the diagnosis of influenza from Nasopharyngeal swab specimens and should not be used as a sole basis for treatment. Nasal washings and aspirates are unacceptable for Xpert Xpress  SARS-CoV-2/FLU/RSV testing.  Fact Sheet for Patients: EntrepreneurPulse.com.au  Fact Sheet for Healthcare Providers: IncredibleEmployment.be  This test is not yet approved or cleared by the [place] FDA and has been authorized for detection and/or diagnosis of SARS-CoV-2 by FDA under an Emergency Use Authorization (EUA). This EUA will remain in effect (meaning this test can be used) for the duration of the COVID-19 declaration under Section 564(b)(1) of the Act, 21 U.S.C. section 360bbb-3(b)(1), unless the authorization is terminated or revoked.  Performed at [facility], [place] [address], [place], [place] [address]     Studies/Results: No results found.  Medications: Scheduled Meds:  cholecalciferol  1,000 Units Oral Daily   enoxaparin (LOVENOX) injection  40 mg Subcutaneous A16P   folic acid  0.5 mg Oral Daily   ketorolac  30 mg Intravenous Q6H   senna-docusate  1 tablet Oral BID   Continuous Infusions: PRN Meds:.HYDROmorphone (DILAUDID) injection, oxyCODONE, polyethylene glycol  Consultants: None  Procedures: None  Antibiotics: None  Assessment/Plan: Principal Problem:  Sickle cell disease with crisis [facility]) Active Problems:   Chronic pain syndrome   Hb-SS disease without crisis ([place])   Hypokalemia   Anemia of chronic disease   Leukocytosis  Sickle cell disease with pain crisis: Pain intensity has improved over the past 24 hours.  Will discontinue IV Dilaudid PCA.  Dilaudid 1 mg IV every 4 hours as needed Will continue oxycodone 15 mg every 4 hours for severe breakthrough pain Toradol 15 mg IV x1 Patient encouraged to ambulate today. Monitor vital signs very closely, reevaluate pain scale regularly, and supplemental oxygen as needed Possible discharge on 02/06/2021  Chronic pain syndrome: Continue home medications  Anemia of chronic disease: Today, patient's hemoglobin is 8.1, which is  consistent with his baseline.  No further blood transfusions warranted at this time.  We will continue to monitor closely.  CBC in AM.  Hypokalemia: Resolved.  Continue to follow closely.  Code Status: Full Code Family Communication: N/A Disposition Plan: Not yet ready for discharge  [place], MSN, FNP-C Patient [place] [address] [place], [place] [address] [phone]  If 7PM-7AM, please contact night-coverage.  02/05/2021, 1:55 PM  LOS: 4 days

## 2021-02-06 ENCOUNTER — Encounter (HOSPITAL_COMMUNITY): Payer: Self-pay | Admitting: Internal Medicine

## 2021-02-06 NOTE — Progress Notes (Signed)
Discharge instructions discussed with patient. Next due medications were written on his discharge papers. Rates his pain a 4/10. Girlfriend here to take patient home. Discharged via wheelchair.

## 2021-02-06 NOTE — Discharge Summary (Signed)
Physician Discharge Summary  [name] PNT:[id number] DOB: [birth year] DOA: 02/01/2021  PCP: Vevelyn Francois, NP  Admit date: 02/01/2021  Discharge date: 02/06/2021  Discharge Diagnoses:  Principal Problem:   Sickle cell disease with crisis [facility]) Active Problems:   Chronic pain syndrome   Hb-SS disease without crisis ([place])   Hypokalemia   Anemia of chronic disease   Leukocytosis  Discharge Condition: Stable  Disposition:   Follow-up Information     Vevelyn Francois, NP. Schedule an appointment as soon as possible for a visit in 1 week(s).   Specialty: Adult Health Nurse Practitioner Why: As needed Contact information: [address] [name] [address] [phone]                Pt is discharged home in good condition and is to follow up with Vevelyn Francois, NP this week to have labs evaluated. [name] is instructed to increase activity slowly and balance with rest for the next few days, and use prescribed medication to complete treatment of pain  Diet: Regular Wt Readings from Last 3 Encounters:  02/06/21 62.4 kg  02/01/21 65.8 kg  12/28/20 67.7 kg   History of present illness:  [name] is a 40 y.o. male with medical history significant of sickle cell disease with previous multiple hospitalizations who is here in the ER complaining of sickle cell painful crisis.  Patient said he has had pain throughout his entire body.  Pain is consistent with his previous sickle cell crisis.  He is on Percocet at home which he took.  This has not relieved his pain.  He has had progressive worsening and is 10 out of 10.  He went to see his PCP today and per patient not get any change in his medicine so he came to the ER.  In the ER he was seen evaluated.  He received up to 6 mg of Dilaudid with his Percocet and pain is still said to be at 9 out of 10 so he is being admitted for further evaluation and treatment..   ED Course: Temperature is 98.2 blood pressure 94/78  pulse 132 respirate 26 oxygen sat 90% on room air.  White count 20.2 hemoglobin 9.1 platelets 367.  Potassium 3.4 otherwise chemistry within normal.  Patient is therefore being admitted to the hospital for sickle cell painful crisis.  Hospital Course:  Patient was admitted for sickle cell pain crisis and managed appropriately with IVF, IV Dilaudid via PCA and IV Toradol, as well as other adjunct therapies per sickle cell pain management protocols.  Patient's general condition slowly improved with above regimen, he was gradually weaned off PCA and transitioned to his oral home pain medications.  Hemoglobin dropped to a nadir of 6.9 for which she was transfused with 1 unit of packed red blood cell since his baseline was between 8 and 9.  There is satisfactory improvement in his hemoglobin to 8.1 at the time of discharge.  Patient started to ambulate well with no significant pain and tolerating p.o. intake with no restrictions.  Patient remained hemodynamically stable throughout this admission.  Patient was therefore discharged home today in a hemodynamically stable condition.   [name] will follow-up with PCP within 1 week of this discharge. [name] was counseled extensively about nonpharmacologic means of pain management, patient verbalized understanding and was appreciative of  the care received during this admission.   We discussed the need for good hydration, monitoring of hydration status, avoidance of heat, cold,  stress, and infection triggers. We discussed the need to be adherent with taking Hydrea and other home medications. Patient was reminded of the need to seek medical attention immediately if any symptom of bleeding, anemia, or infection occurs.  Discharge Exam: Vitals:   02/06/21 0644 02/06/21 0926  BP: 110/64 121/70  Pulse: 71 63  Resp: 16 16  Temp: 99.2 F (37.3 C) 98.6 F (37 C)  SpO2: 96% 99%   Vitals:   02/06/21 0237 02/06/21 0644 02/06/21 0646 02/06/21 0926  BP: 120/69 110/64   121/70  Pulse: 67 71  63  Resp: 16 16  16   Temp: 98.9 F (37.2 C) 99.2 F (37.3 C)  98.6 F (37 C)  TempSrc: Oral Oral  Oral  SpO2: 100% 96%  99%  Weight:   62.4 kg   Height:        General appearance : Awake, alert, not in any distress. Speech Clear. Not toxic looking HEENT: Atraumatic and Normocephalic, pupils equally reactive to light and accomodation Neck: Supple, no JVD. No cervical lymphadenopathy.  Chest: Good air entry bilaterally, no added sounds  CVS: S1 S2 regular, no murmurs.  Abdomen: Bowel sounds present, Non tender and not distended with no gaurding, rigidity or rebound. Extremities: B/L Lower Ext shows no edema, both legs are warm to touch Neurology: Awake alert, and oriented X 3, CN II-XII intact, Non focal Skin: No Rash  Discharge Instructions  Discharge Instructions     Diet - low sodium heart healthy   Complete by: As directed    Increase activity slowly   Complete by: As directed       Allergies as of 02/06/2021   No Known Allergies      Medication List     TAKE these medications    cholecalciferol 25 MCG (1000 UNIT) tablet Commonly known as: VITAMIN D3 Take 1,000 Units by mouth daily.   folic acid 419 MCG tablet Commonly known as: FOLVITE Take 400 mcg by mouth daily.   oxyCODONE-acetaminophen 10-325 MG tablet Commonly known as: Percocet Take 1 tablet by mouth every 4 (four) hours as needed for up to 15 days for pain.       ASK your doctor about these medications    hydroxyurea 500 MG capsule Commonly known as: HYDREA May take with food to minimize GI side effects.        The results of significant diagnostics from this hospitalization (including imaging, microbiology, ancillary and laboratory) are listed below for reference.    Significant Diagnostic Studies: DG Chest Portable 1 View  Result Date: 02/01/2021 CLINICAL DATA:  Chest pain with history of sickle cell EXAM: PORTABLE CHEST 1 VIEW COMPARISON:  09/13/2018 FINDINGS:  Cardiac shadow is within normal limits. The lungs are well aerated bilaterally. No focal infiltrate or sizable effusion is seen. No bony abnormality is seen. IMPRESSION: No active disease. Electronically Signed   By: Inez Catalina M.D.   On: 02/01/2021 20:05    Microbiology: Recent Results (from the past 240 hour(s))  Urine Culture     Status: None   Collection Time: 02/01/21  3:51 PM   Specimen: Urine   UR  Result Value Ref Range Status   Urine Culture, Routine Final report  Final   Organism ID, Bacteria Comment  Final    Comment: Culture shows less than 10,000 colony forming units of bacteria per milliliter of urine. This colony count is not generally considered to be clinically significant.   Resp Panel by RT-PCR (Flu A&B,  Covid) Nasopharyngeal Swab     Status: None   Collection Time: 02/01/21  8:16 PM   Specimen: Nasopharyngeal Swab; Nasopharyngeal(NP) swabs in vial transport medium  Result Value Ref Range Status   SARS Coronavirus 2 by RT PCR NEGATIVE NEGATIVE Final    Comment: (NOTE) SARS-CoV-2 target nucleic acids are NOT DETECTED.  The SARS-CoV-2 RNA is generally detectable in upper respiratory specimens during the acute phase of infection. The lowest concentration of SARS-CoV-2 viral copies this assay can detect is 138 copies/mL. A negative result does not preclude SARS-Cov-2 infection and should not be used as the sole basis for treatment or other patient management decisions. A negative result may occur with  improper specimen collection/handling, submission of specimen other than nasopharyngeal swab, presence of viral mutation(s) within the areas targeted by this assay, and inadequate number of viral copies(<138 copies/mL). A negative result must be combined with clinical observations, patient history, and epidemiological information. The expected result is Negative.  Fact Sheet for Patients:  EntrepreneurPulse.com.au  Fact Sheet for Healthcare  Providers:  IncredibleEmployment.be  This test is no t yet approved or cleared by the [place] FDA and  has been authorized for detection and/or diagnosis of SARS-CoV-2 by FDA under an Emergency Use Authorization (EUA). This EUA will remain  in effect (meaning this test can be used) for the duration of the COVID-19 declaration under Section 564(b)(1) of the Act, 21 U.S.C.section 360bbb-3(b)(1), unless the authorization is terminated  or revoked sooner.       Influenza A by PCR NEGATIVE NEGATIVE Final   Influenza B by PCR NEGATIVE NEGATIVE Final    Comment: (NOTE) The Xpert Xpress SARS-CoV-2/FLU/RSV plus assay is intended as an aid in the diagnosis of influenza from Nasopharyngeal swab specimens and should not be used as a sole basis for treatment. Nasal washings and aspirates are unacceptable for Xpert Xpress SARS-CoV-2/FLU/RSV testing.  Fact Sheet for Patients: EntrepreneurPulse.com.au  Fact Sheet for Healthcare Providers: IncredibleEmployment.be  This test is not yet approved or cleared by the [place] FDA and has been authorized for detection and/or diagnosis of SARS-CoV-2 by FDA under an Emergency Use Authorization (EUA). This EUA will remain in effect (meaning this test can be used) for the duration of the COVID-19 declaration under Section 564(b)(1) of the Act, 21 U.S.C. section 360bbb-3(b)(1), unless the authorization is terminated or revoked.  Performed at [facility], [place] [address], [place], [place] [address]      Labs: Basic Metabolic Panel: Recent Labs  Lab 02/01/21 1505 02/01/21 1720 02/02/21 0632 02/04/21 0709 02/05/21 0510  NA 140 139 140 135 136  K 4.6 3.4* 4.0 3.2* 3.7  CL 101 103 107 102 102  CO2 23 24 26 26 26   GLUCOSE 88 109* 123* 131* 96  BUN 8 10 11 10 10   CREATININE 0.78 0.79 0.77  0.71 0.88 0.81  CALCIUM 9.9 9.9 9.2 8.9 9.1   Liver Function  Tests: Recent Labs  Lab 02/01/21 1505 02/01/21 1720 02/02/21 0632  AST 34 42* 44*  ALT 15 19 23   ALKPHOS 74 62 50  BILITOT 2.9* 3.3* 3.8*  PROT 8.9* 9.2* 8.5*  ALBUMIN 5.0 5.3* 4.6   No results for input(s): LIPASE, AMYLASE in the last 168 hours. No results for input(s): AMMONIA in the last 168 hours. CBC: Recent Labs  Lab 02/01/21 1505 02/01/21 1720 02/02/21 0632 02/03/21 0908 02/04/21 0709 02/05/21 0510  WBC 9.6 20.2* 17.4* 14.0* 13.1* 9.6  9.9  NEUTROABS 4.2 16.4* 14.1*  --   --   --  HGB 9.0* 9.1* 8.1* 7.0* 6.9* 8.1*  8.2*  HCT 25.8* 25.4* 22.6* 19.6* 18.9* 23.3*  23.3*  MCV 88 86.7 87.3 86.0 86.7 86.6  86.0  PLT 341 367 276 220 248 242  260   Cardiac Enzymes: No results for input(s): CKTOTAL, CKMB, CKMBINDEX, TROPONINI in the last 168 hours. BNP: Invalid input(s): POCBNP CBG: No results for input(s): GLUCAP in the last 168 hours.  Time coordinating discharge: 50 minutes  Signed:  [facility] 02/06/2021, 12:50 PM

## 2021-02-23 ENCOUNTER — Telehealth: Payer: Self-pay

## 2021-02-23 NOTE — Telephone Encounter (Signed)
percocet

## 2021-02-24 ENCOUNTER — Other Ambulatory Visit: Payer: Self-pay | Admitting: Nurse Practitioner

## 2021-02-24 DIAGNOSIS — D571 Sickle-cell disease without crisis: Secondary | ICD-10-CM

## 2021-02-24 DIAGNOSIS — Z79891 Long term (current) use of opiate analgesic: Secondary | ICD-10-CM

## 2021-02-24 MED ORDER — OXYCODONE-ACETAMINOPHEN 10-325 MG PO TABS: 1.0000 | ORAL_TABLET | ORAL | 0 refills | Status: AC | PRN

## 2021-03-25 ENCOUNTER — Telehealth: Payer: Self-pay

## 2021-03-25 NOTE — Telephone Encounter (Signed)
Percocet

## 2021-03-29 ENCOUNTER — Telehealth: Payer: Self-pay

## 2021-03-29 NOTE — Telephone Encounter (Signed)
Percocet

## 2021-03-30 ENCOUNTER — Other Ambulatory Visit: Payer: Self-pay | Admitting: Nurse Practitioner

## 2021-03-30 DIAGNOSIS — Z79891 Long term (current) use of opiate analgesic: Secondary | ICD-10-CM

## 2021-03-30 DIAGNOSIS — D571 Sickle-cell disease without crisis: Secondary | ICD-10-CM

## 2021-03-30 MED ORDER — OXYCODONE-ACETAMINOPHEN 10-325 MG PO TABS: 1.0000 | ORAL_TABLET | ORAL | 0 refills | Status: DC | PRN

## 2021-04-27 ENCOUNTER — Other Ambulatory Visit: Payer: Self-pay

## 2021-04-27 ENCOUNTER — Inpatient Hospital Stay (HOSPITAL_COMMUNITY)
Admission: AD | Admit: 2021-04-27 | Discharge: 2021-05-02 | DRG: 812 | Disposition: A | Discharge status: Home / Self Care | Payer: Medicare Other | Source: Ambulatory Visit | Attending: Internal Medicine | Admitting: Internal Medicine

## 2021-04-27 DIAGNOSIS — F1120 Opioid dependence, uncomplicated: Secondary | ICD-10-CM | POA: Diagnosis present

## 2021-04-27 DIAGNOSIS — Z832 Family history of diseases of the blood and blood-forming organs and certain disorders involving the immune mechanism: Secondary | ICD-10-CM

## 2021-04-27 DIAGNOSIS — D638 Anemia in other chronic diseases classified elsewhere: Secondary | ICD-10-CM | POA: Diagnosis present

## 2021-04-27 DIAGNOSIS — D5700 Hb-SS disease with crisis, unspecified: Principal | ICD-10-CM | POA: Diagnosis present

## 2021-04-27 DIAGNOSIS — J9811 Atelectasis: Secondary | ICD-10-CM | POA: Diagnosis present

## 2021-04-27 DIAGNOSIS — G894 Chronic pain syndrome: Secondary | ICD-10-CM | POA: Diagnosis present

## 2021-04-27 DIAGNOSIS — R0789 Other chest pain: Secondary | ICD-10-CM

## 2021-04-27 DIAGNOSIS — Z20822 Contact with and (suspected) exposure to covid-19: Secondary | ICD-10-CM | POA: Diagnosis present

## 2021-04-27 LAB — CBC WITH DIFFERENTIAL/PLATELET
Abs Immature Granulocytes: 0.35 10*3/uL — ABNORMAL HIGH (ref 0.00–0.07)
Basophils Absolute: 0.1 10*3/uL (ref 0.0–0.1)
Basophils Relative: 1 %
Eosinophils Absolute: 0.1 10*3/uL (ref 0.0–0.5)
Eosinophils Relative: 2 %
HCT: 24.4 % — ABNORMAL LOW (ref 39.0–52.0)
Hemoglobin: 8.7 g/dL — ABNORMAL LOW (ref 13.0–17.0)
Immature Granulocytes: 5 %
Lymphocytes Relative: 30 %
Lymphs Abs: 2.2 10*3/uL (ref 0.7–4.0)
MCH: 30.9 pg (ref 26.0–34.0)
MCHC: 35.7 g/dL (ref 30.0–36.0)
MCV: 86.5 fL (ref 80.0–100.0)
Monocytes Absolute: 0.5 10*3/uL (ref 0.1–1.0)
Monocytes Relative: 7 %
Neutro Abs: 4 10*3/uL (ref 1.7–7.7)
Neutrophils Relative %: 55 %
Platelets: 362 10*3/uL (ref 150–400)
RBC: 2.82 MIL/uL — ABNORMAL LOW (ref 4.22–5.81)
RDW: 18.6 % — ABNORMAL HIGH (ref 11.5–15.5)
WBC: 7.2 10*3/uL (ref 4.0–10.5)
nRBC: 3.6 % — ABNORMAL HIGH (ref 0.0–0.2)

## 2021-04-27 LAB — COMPREHENSIVE METABOLIC PANEL
ALT: 20 U/L (ref 0–44)
AST: 29 U/L (ref 15–41)
Albumin: 4.4 g/dL (ref 3.5–5.0)
Alkaline Phosphatase: 68 U/L (ref 38–126)
Anion gap: 10 (ref 5–15)
BUN: 6 mg/dL (ref 6–20)
CO2: 25 mmol/L (ref 22–32)
Calcium: 9.1 mg/dL (ref 8.9–10.3)
Chloride: 105 mmol/L (ref 98–111)
Creatinine, Ser: 0.56 mg/dL — ABNORMAL LOW (ref 0.61–1.24)
GFR, Estimated: >60 mL/min (ref >60)
Glucose, Bld: 96 mg/dL (ref 70–99)
Potassium: 3.9 mmol/L (ref 3.5–5.1)
Sodium: 140 mmol/L (ref 135–145)
Total Bilirubin: 3.3 mg/dL — ABNORMAL HIGH (ref 0.3–1.2)
Total Protein: 7.9 g/dL (ref 6.5–8.1)

## 2021-04-27 LAB — RETICULOCYTES
Immature Retic Fract: 39.7 % — ABNORMAL HIGH (ref 2.3–15.9)
RBC.: 2.8 MIL/uL — ABNORMAL LOW (ref 4.22–5.81)
Retic Count, Absolute: 166.6 10*3/uL (ref 19.0–186.0)
Retic Ct Pct: 6 % — ABNORMAL HIGH (ref 0.4–3.1)

## 2021-04-27 MED ORDER — KETOROLAC TROMETHAMINE 30 MG/ML IJ SOLN
15.0000 mg | Freq: Once | INTRAMUSCULAR | Status: AC
  Administered 2021-04-27: 15 mg via INTRAVENOUS
  Filled 2021-04-27: qty 1

## 2021-04-27 MED ORDER — VITAMIN D 25 MCG (1000 UNIT) PO TABS
1000.0000 [IU] | ORAL_TABLET | Freq: Every day | ORAL | Status: DC
  Administered 2021-04-28 – 2021-05-02 (×5): 1000 [IU] via ORAL
  Filled 2021-04-27 (×5): qty 1

## 2021-04-27 MED ORDER — KETOROLAC TROMETHAMINE 15 MG/ML IJ SOLN
15.0000 mg | Freq: Four times a day (QID) | INTRAMUSCULAR | Status: DC
  Administered 2021-04-27 – 2021-05-02 (×19): 15 mg via INTRAVENOUS
  Filled 2021-04-27 (×19): qty 1

## 2021-04-27 MED ORDER — ACETAMINOPHEN 500 MG PO TABS
1000.0000 mg | ORAL_TABLET | Freq: Once | ORAL | Status: AC
  Administered 2021-04-27: 1000 mg via ORAL
  Filled 2021-04-27: qty 2

## 2021-04-27 MED ORDER — HYDROMORPHONE 1 MG/ML IV SOLN
INTRAVENOUS | Status: DC
  Administered 2021-04-27: 3.5 mg via INTRAVENOUS
  Administered 2021-04-27: 5 mg via INTRAVENOUS
  Administered 2021-04-28 (×2): 5.5 mg via INTRAVENOUS
  Administered 2021-04-28: 9 mg via INTRAVENOUS
  Administered 2021-04-28: 6.6 mg via INTRAVENOUS
  Administered 2021-04-28: 5 mg via INTRAVENOUS
  Administered 2021-04-28: 7.5 mg via INTRAVENOUS
  Administered 2021-04-29: 14 mg via INTRAVENOUS
  Administered 2021-04-29: 4.5 mg via INTRAVENOUS
  Administered 2021-04-30: 3 mg via INTRAVENOUS
  Administered 2021-04-30: 2.5 mg via INTRAVENOUS
  Administered 2021-04-30: 3.5 mg via INTRAVENOUS
  Administered 2021-05-01: 4 mg via INTRAVENOUS
  Administered 2021-05-01: 2.5 mg via INTRAVENOUS
  Administered 2021-05-01: 1.5 mg via INTRAVENOUS
  Administered 2021-05-02 (×2): 0.5 mg via INTRAVENOUS
  Filled 2021-04-27 (×4): qty 30

## 2021-04-27 MED ORDER — ENOXAPARIN SODIUM 40 MG/0.4ML IJ SOSY
40.0000 mg | PREFILLED_SYRINGE | INTRAMUSCULAR | Status: DC
  Administered 2021-04-27 – 2021-05-01 (×5): 40 mg via SUBCUTANEOUS
  Filled 2021-04-27 (×5): qty 0.4

## 2021-04-27 MED ORDER — ONDANSETRON HCL 4 MG/2ML IJ SOLN
4.0000 mg | Freq: Four times a day (QID) | INTRAMUSCULAR | Status: DC | PRN
  Administered 2021-04-27: 4 mg via INTRAVENOUS
  Filled 2021-04-27: qty 2

## 2021-04-27 MED ORDER — POLYETHYLENE GLYCOL 3350 17 G PO PACK: 17.0000 g | PACK | Freq: Every day | ORAL | Status: DC | PRN

## 2021-04-27 MED ORDER — SODIUM CHLORIDE 0.9% FLUSH: 9.0000 mL | INTRAVENOUS | Status: DC | PRN

## 2021-04-27 MED ORDER — FOLIC ACID 400 MCG PO TABS: 400.0000 ug | ORAL_TABLET | Freq: Every day | ORAL | Status: DC

## 2021-04-27 MED ORDER — FOLIC ACID 1 MG PO TABS
0.5000 mg | ORAL_TABLET | Freq: Every day | ORAL | Status: DC
  Administered 2021-04-28 – 2021-05-02 (×6): 0.5 mg via ORAL
  Filled 2021-04-27 (×6): qty 1

## 2021-04-27 MED ORDER — SODIUM CHLORIDE 0.45 % IV SOLN: INTRAVENOUS | Status: DC

## 2021-04-27 MED ORDER — NALOXONE HCL 0.4 MG/ML IJ SOLN: 0.4000 mg | INTRAMUSCULAR | Status: DC | PRN

## 2021-04-27 MED ORDER — SENNOSIDES-DOCUSATE SODIUM 8.6-50 MG PO TABS
1.0000 | ORAL_TABLET | Freq: Two times a day (BID) | ORAL | Status: DC
  Administered 2021-04-27 – 2021-05-02 (×9): 1 via ORAL
  Filled 2021-04-27 (×10): qty 1

## 2021-04-27 MED ORDER — HYDROXYUREA 500 MG PO CAPS
500.0000 mg | ORAL_CAPSULE | Freq: Two times a day (BID) | ORAL | Status: DC
Start: 2021-04-27 — End: 2021-05-02
  Administered 2021-04-27 – 2021-05-02 (×10): 500 mg via ORAL
  Filled 2021-04-27 (×10): qty 1

## 2021-04-27 MED ORDER — DIPHENHYDRAMINE HCL 25 MG PO CAPS: 25.0000 mg | ORAL_CAPSULE | ORAL | Status: DC | PRN

## 2021-04-27 NOTE — Progress Notes (Signed)
Patient admitted to the day infusion hospital for management of sickle cell pain crisis. Patient initially reported back pain rated 8/10. For pain management, patient placed on Dilaudid PCA, given Tylenol, Toradol and hydrated with IV fluids. Patient's pain temporarily decreased to 5/10 but increased to 7/10. Provider assessed patient and placed orders for patient to be admitted overnight for continued pain management. Report called to Velva Harman, RN on [place]. Patient transferred to [place] in wheelchair on PCA (settings 0.5/10/3 checked with Nicola Girt, RN). Vital signs wnl. Patient alert, oriented and stable at transfer.

## 2021-04-27 NOTE — Progress Notes (Signed)
Patient came to the lobby of Patient [name] for triage. Patient reports sickle cell pain in lower back rated 8/10. Reports taking Percocet for pain at 9:30 am.  COVID-19 screening done and patient denies all symptoms and exposures. Denies fever, chest pain, nausea, vomiting, diarrhea, abdominal pain and priapism. Admits to having transportation without driving self. [place], [place] notified. Patient can come to the day hospital for pain management. Patient advised.

## 2021-04-27 NOTE — H&P (Signed)
[facility] History and Physical   Date: 04/27/2021  Patient name: [name] Medical record number: [id number] Date of birth: 03-08-1981 Age: 40 y.o. Gender: male PCP: Vevelyn Francois, NP  Attending physician: Tresa Garter, MD  Chief Complaint: Sickle cell pain   History of Present Illness: [name] is a 40 year old male with a medical history significant for sickle cell disease, chronic pain syndrome, opiate dependence and tolerance, and history of anemia of chronic disease presented to sickle cell day clinic for low back pain.  Low back pain is consistent with patient's typical sickle cell pain crisis.  He states that pain intensity has been increased over the past 24 hours and was unrelieved by his home medications.  He awakened suddenly with sharp, constant back pain.  He has not identified any palliating or provocative factors concerning crisis.  He took oxycodone without any relief.  He currently rates his pain at 8/10.  He denies any headache, chest pain, shortness of breath, urinary symptoms, nausea, vomiting, or diarrhea.  No dizziness or paresthesias.  No sick contacts, recent travel, or exposure to COVID-19.  Meds: Medications Prior to Admission  Medication Sig Dispense Refill Last Dose   cholecalciferol (VITAMIN D3) 25 MCG (1000 UNIT) tablet Take 1,000 Units by mouth daily.      folic acid (FOLVITE) A999333 MCG tablet Take 400 mcg by mouth daily.      hydroxyurea (HYDREA) 500 MG capsule May take with food to minimize GI side effects. (Patient taking differently: Take 500 mg by mouth 2 (two) times daily. May take with food to minimize GI side effects.) 360 capsule 1     Allergies: Patient has no known allergies. Past Medical History:  Diagnosis Date   Sickle cell anemia (HCC)    Past Surgical History:  Procedure Laterality Date   CHOLECYSTECTOMY     Family History  Problem Relation Age of Onset   Sickle cell anemia Mother    Sickle cell  trait Father    Sickle cell trait Sister    Sickle cell trait Brother    Cancer Maternal Grandmother    Social History   Socioeconomic History   Marital status: Single    Spouse name: Not on file   Number of children: Not on file   Years of education: Not on file   Highest education level: Not on file  Occupational History   Not on file  Tobacco Use   Smoking status: Never   Smokeless tobacco: Never  Vaping Use   Vaping Use: Never used  Substance and Sexual Activity   Alcohol use: No   Drug use: No   Sexual activity: Yes  Other Topics Concern   Not on file  Social History Narrative   Not on file   Social Determinants of Health   Financial Resource Strain: Not on file  Food Insecurity: Not on file  Transportation Needs: Not on file  Physical Activity: Not on file  Stress: Not on file  Social Connections: Not on file  Intimate Partner Violence: Not on file   Review of Systems  Constitutional: Negative.   HENT: Negative.    Eyes: Negative.   Respiratory: Negative.    Cardiovascular: Negative.   Gastrointestinal: Negative.   Genitourinary: Negative.   Musculoskeletal:  Positive for back pain and joint pain.  Skin: Negative.    Physical Exam: Blood pressure 113/61, pulse 83, temperature 98 F (36.7 C), temperature source Temporal, resp. rate 18. BP  122/77 (BP Location: Left Arm)   Pulse 93   Temp 98.7 F (37.1 C) (Oral)   Resp 18   Ht '6\' 2"'$  (1.88 m)   Wt 68 kg   SpO2 100%   BMI 19.25 kg/m   General Appearance:    Alert, cooperative, no distress, appears stated age  Head:    Normocephalic, without obvious abnormality, atraumatic  Eyes:    PERRL, conjunctiva/corneas clear, EOM's intact, fundi    benign, both eyes       Neck:   Supple, symmetrical, trachea midline, no adenopathy;       thyroid:  No enlargement/tenderness/nodules; no carotid   bruit or JVD  Back:     Symmetric, no curvature, ROM normal, no CVA tenderness  Lungs:     Clear to auscultation  bilaterally, respirations unlabored  Chest wall:    No tenderness or deformity  Heart:    Regular rate and rhythm, S1 and S2 normal, no murmur, rub   or gallop  Abdomen:     Soft, non-tender, bowel sounds active all four quadrants,    no masses, no organomegaly  Extremities:   Extremities normal, atraumatic, no cyanosis or edema  Pulses:   2+ and symmetric all extremities  Skin:   Skin color, texture, turgor normal, no rashes or lesions  Lymph nodes:   Cervical, supraclavicular, and axillary nodes normal  Neurologic:   CNII-XII intact. Normal strength, sensation and reflexes      throughout     Lab results: No results found for this or any previous visit (from the past 24 hour(s)).  Imaging results:  No results found.   Assessment & Plan: Patient admitted to sickle cell day infusion center for management of pain crisis.  Patient is opiate tolerant Initiate IV dilaudid PCA.  IV fluids, 0.45% saline at 100 ml/hr Toradol 15 mg IV times one dose Tylenol 1000 mg by mouth times one dose Review CBC with differential, complete metabolic panel, and reticulocytes as results become available. Baseline hemoglobin is 8-9 Pain intensity will be reevaluated in context of functioning and relationship to baseline as care progresses If pain intensity remains elevated and/or sudden change in hemodynamic stability transition to inpatient services for higher level of care.     Donia Pounds  APRN, MSN, FNP-C Patient [facility] [address] [place], [place] [address] [phone]   04/27/2021, 12:18 PM

## 2021-04-27 NOTE — Progress Notes (Signed)
Per PCA pump program history, last cleared at 1709. Pt pain at 7/10 this time.

## 2021-04-28 DIAGNOSIS — Z20822 Contact with and (suspected) exposure to covid-19: Secondary | ICD-10-CM | POA: Diagnosis present

## 2021-04-28 DIAGNOSIS — R0789 Other chest pain: Secondary | ICD-10-CM | POA: Diagnosis not present

## 2021-04-28 DIAGNOSIS — F1120 Opioid dependence, uncomplicated: Secondary | ICD-10-CM | POA: Diagnosis present

## 2021-04-28 DIAGNOSIS — Z832 Family history of diseases of the blood and blood-forming organs and certain disorders involving the immune mechanism: Secondary | ICD-10-CM | POA: Diagnosis not present

## 2021-04-28 DIAGNOSIS — D5700 Hb-SS disease with crisis, unspecified: Secondary | ICD-10-CM | POA: Diagnosis not present

## 2021-04-28 DIAGNOSIS — G894 Chronic pain syndrome: Secondary | ICD-10-CM | POA: Diagnosis not present

## 2021-04-28 DIAGNOSIS — J9811 Atelectasis: Secondary | ICD-10-CM | POA: Diagnosis present

## 2021-04-28 DIAGNOSIS — D638 Anemia in other chronic diseases classified elsewhere: Secondary | ICD-10-CM | POA: Diagnosis not present

## 2021-04-28 LAB — BASIC METABOLIC PANEL
Anion gap: 9 (ref 5–15)
BUN: 8 mg/dL (ref 6–20)
CO2: 26 mmol/L (ref 22–32)
Calcium: 9 mg/dL (ref 8.9–10.3)
Chloride: 100 mmol/L (ref 98–111)
Creatinine, Ser: 0.62 mg/dL (ref 0.61–1.24)
GFR, Estimated: >60 mL/min (ref >60)
Glucose, Bld: 102 mg/dL — ABNORMAL HIGH (ref 70–99)
Potassium: 4 mmol/L (ref 3.5–5.1)
Sodium: 135 mmol/L (ref 135–145)

## 2021-04-28 LAB — CBC
HCT: 23.1 % — ABNORMAL LOW (ref 39.0–52.0)
Hemoglobin: 8.3 g/dL — ABNORMAL LOW (ref 13.0–17.0)
MCH: 30.6 pg (ref 26.0–34.0)
MCHC: 35.9 g/dL (ref 30.0–36.0)
MCV: 85.2 fL (ref 80.0–100.0)
Platelets: 311 10*3/uL (ref 150–400)
RBC: 2.71 MIL/uL — ABNORMAL LOW (ref 4.22–5.81)
RDW: 18.3 % — ABNORMAL HIGH (ref 11.5–15.5)
WBC: 14.8 10*3/uL — ABNORMAL HIGH (ref 4.0–10.5)
nRBC: 8.9 % — ABNORMAL HIGH (ref 0.0–0.2)

## 2021-04-28 LAB — SARS CORONAVIRUS 2 (TAT 6-24 HRS): SARS Coronavirus 2: NEGATIVE

## 2021-04-28 MED ORDER — OXYCODONE HCL 5 MG PO TABS
5.0000 mg | ORAL_TABLET | ORAL | Status: DC | PRN
  Administered 2021-04-28 – 2021-05-01 (×13): 5 mg via ORAL
  Filled 2021-04-28 (×14): qty 1

## 2021-04-28 MED ORDER — OXYCODONE-ACETAMINOPHEN 5-325 MG PO TABS
1.0000 | ORAL_TABLET | ORAL | Status: DC | PRN
  Administered 2021-04-28 – 2021-05-01 (×13): 1 via ORAL
  Filled 2021-04-28 (×13): qty 1

## 2021-04-28 NOTE — Progress Notes (Signed)
Subjective: [name] is a 40 year old male with a medical history significant for sickle cell disease, chronic pain syndrome, opiate dependence and tolerance, and history of anemia of chronic disease that was admitted for sickle cell pain crisis. Patient is complaining of increasing pain to low back and lower extremities.  He rates his pain as 9/10.  Pain persists despite IV Dilaudid PCA.  Patient is not maximizing PCA.  Patient denies headache, chest pain, shortness of breath, urinary symptoms, nausea, vomiting, or diarrhea.  Objective:  Vital signs in last 24 hours:  Vitals:   04/28/21 0335 04/28/21 0406 04/28/21 0714 04/28/21 1215  BP: 122/77     Pulse: 93     Resp: '20 18 16 15  '$ Temp: 98.7 F (37.1 C)     TempSrc: Oral     SpO2: 99% 100% 91% 96%  Weight:      Height:        Intake/Output from previous day:   Intake/Output Summary (Last 24 hours) at 04/28/2021 1234 Last data filed at 04/28/2021 0900 Gross per 24 hour  Intake 3145.15 ml  Output 1870 ml  Net 1275.15 ml    Physical Exam: General: Alert, awake, oriented x3, in no acute distress.  HEENT: [place]/AT PEERL, EOMI Neck: Trachea midline,  no masses, no thyromegal,y no JVD, no carotid bruit OROPHARYNX:  Moist, No exudate/ erythema/lesions.  Heart: Regular rate and rhythm, without murmurs, rubs, gallops, PMI non-displaced, no heaves or thrills on palpation.  Lungs: Clear to auscultation, no wheezing or rhonchi noted. No increased vocal fremitus resonant to percussion  Abdomen: Soft, nontender, nondistended, positive bowel sounds, no masses no hepatosplenomegaly noted..  Neuro: No focal neurological deficits noted cranial nerves II through XII grossly intact. DTRs 2+ bilaterally upper and lower extremities. Strength 5 out of 5 in bilateral upper and lower extremities. Musculoskeletal: No warm swelling or erythema around joints, no spinal tenderness noted. Psychiatric: Patient alert and oriented x3, good insight and  cognition, good recent to remote recall. Lymph node survey: No cervical axillary or inguinal lymphadenopathy noted.  Lab Results:  Basic Metabolic Panel:    Component Value Date/Time   NA 135 04/28/2021 0433   NA 140 02/01/2021 1505   K 4.0 04/28/2021 0433   CL 100 04/28/2021 0433   CO2 26 04/28/2021 0433   BUN 8 04/28/2021 0433   BUN 8 02/01/2021 1505   CREATININE 0.62 04/28/2021 0433   CREATININE 0.76 03/08/2017 1047   GLUCOSE 102 (H) 04/28/2021 0433   CALCIUM 9.0 04/28/2021 0433   CBC:    Component Value Date/Time   WBC 14.8 (H) 04/28/2021 0433   HGB 8.3 (L) 04/28/2021 0433   HGB 9.0 (L) 02/01/2021 1505   HCT 23.1 (L) 04/28/2021 0433   HCT 25.8 (L) 02/01/2021 1505   PLT 311 04/28/2021 0433   PLT 341 02/01/2021 1505   MCV 85.2 04/28/2021 0433   MCV 88 02/01/2021 1505   NEUTROABS 4.0 04/27/2021 1318   NEUTROABS 4.2 02/01/2021 1505   LYMPHSABS 2.2 04/27/2021 1318   LYMPHSABS 4.2 (H) 02/01/2021 1505   MONOABS 0.5 04/27/2021 1318   EOSABS 0.1 04/27/2021 1318   EOSABS 0.2 02/01/2021 1505   BASOSABS 0.1 04/27/2021 1318   BASOSABS 0.1 02/01/2021 1505    Recent Results (from the past 240 hour(s))  SARS CORONAVIRUS 2 (TAT 6-24 HRS) Nasopharyngeal Nasopharyngeal Swab     Status: None   Collection Time: 04/27/21  5:57 PM   Specimen: Nasopharyngeal Swab  Result Value Ref Range Status  SARS Coronavirus 2 NEGATIVE NEGATIVE Final    Comment: (NOTE) SARS-CoV-2 target nucleic acids are NOT DETECTED.  The SARS-CoV-2 RNA is generally detectable in upper and lower respiratory specimens during the acute phase of infection. Negative results do not preclude SARS-CoV-2 infection, do not rule out co-infections with other pathogens, and should not be used as the sole basis for treatment or other patient management decisions. Negative results must be combined with clinical observations, patient history, and epidemiological information. The expected result is Negative.  Fact Sheet  for Patients: SugarRoll.be  Fact Sheet for Healthcare Providers: https://www.woods-mathews.com/  This test is not yet approved or cleared by the [place] FDA and  has been authorized for detection and/or diagnosis of SARS-CoV-2 by FDA under an Emergency Use Authorization (EUA). This EUA will remain  in effect (meaning this test can be used) for the duration of the COVID-19 declaration under Se ction 564(b)(1) of the Act, 21 U.S.C. section 360bbb-3(b)(1), unless the authorization is terminated or revoked sooner.  Performed at [facility], [place] [address] [place], [place] [address]     Studies/Results: No results found.  Medications: Scheduled Meds:  cholecalciferol  1,000 Units Oral Daily   enoxaparin (LOVENOX) injection  40 mg Subcutaneous A999333   folic acid  0.5 mg Oral Daily   HYDROmorphone   Intravenous Q4H   hydroxyurea  500 mg Oral BID   ketorolac  15 mg Intravenous Q6H   senna-docusate  1 tablet Oral BID   Continuous Infusions:  sodium chloride 150 mL/hr at 04/28/21 0829   PRN Meds:.diphenhydrAMINE, naloxone **AND** sodium chloride flush, ondansetron (ZOFRAN) IV, oxyCODONE-acetaminophen **AND** oxyCODONE, polyethylene glycol  Consultants: None  Procedures: None  Antibiotics: None  Assessment/Plan: Principal Problem:   Sickle cell pain crisis (HCC) Active Problems:   Chronic pain syndrome   Anemia of chronic disease  Sickle cell disease with pain crisis: Decrease IV fluids to KVO Continue IV Dilaudid PCA without changes today IV Toradol 15 mg every 6 hours for total of 5 days Percocet 10-325 mg every 4 hours as needed for severe breakthrough pain  Anemia of chronic disease: Hemoglobin is stable and consistent with patient's baseline.  There is no clinical indication for blood transfusion at this time.  Continue to follow closely.  Leukocytosis: Mild leukocytosis.  More than likely secondary to  sickle cell crisis.  Patient is afebrile without any signs of acute infection.  Continue to follow closely.  CBC in AM.  Chronic pain syndrome: Continue home medications  Code Status: Full Code Family Communication: N/A Disposition Plan: Not yet ready for discharge  [place], MSN, FNP-C Patient [facility] [place], [place] [address] [phone]  If 5PM-8AM, please contact night-coverage.  04/28/2021, 12:34 PM  LOS: 0 days

## 2021-04-28 NOTE — H&P (Signed)
H&P  Patient Demographics:  [name], is a 40 y.o. male  MRN: [id number]   DOB - 07-Dec-1980  Admit Date - 04/27/2021  Outpatient Primary MD for the patient is Vevelyn Francois, NP       HPI:   [name] is a 40 year old male with a medical history significant for sickle cell disease, chronic pain syndrome, opiate dependence and tolerance, and history of anemia of chronic disease presented to sickle cell day clinic for low back pain.  Low back pain is consistent with patient's typical sickle cell pain crisis.  He states that pain intensity has been increased over the past 24 hours and was unrelieved by his home medications.  He awakened suddenly with sharp, constant back pain.  He has not identified any palliating or provocative factors concerning crisis.  He took oxycodone without any relief.  He currently rates his pain at 8/10.  He denies any headache, chest pain, shortness of breath, urinary symptoms, nausea, vomiting, or diarrhea.  No dizziness or paresthesias.  No sick contacts, recent travel, or exposure to COVID-19.  Sickle cell day infusion center course: Vital signs remained stable throughout admission.  Complete metabolic panel shows an elevated bilirubin of 3.3, otherwise unremarkable.  Complete blood count shows hemoglobin of 8.9 g/dL, consistent with patient's baseline, otherwise unremarkable.  Robust reticulocytosis.  Pain persists despite IV fluids, IV Toradol, and IV Dilaudid PCA.  Admit to [place] and observation for further management of sickle cell pain crisis.   Review of systems:  In addition to the HPI above, patient reports No fever or chills No Headache, No changes with vision or hearing No problems swallowing food or liquids No chest pain, cough or shortness of breath No abdominal pain, No nausea or vomiting, Bowel movements are regular No blood in stool or urine No dysuria No new skin rashes or bruises No new joints pains-aches No new weakness, tingling,  numbness in any extremity No recent weight gain or loss No polyuria, polydypsia or polyphagia No significant Mental Stressors  With Past History of the following :   Past Medical History:  Diagnosis Date   Sickle cell anemia (HCC)       Past Surgical History:  Procedure Laterality Date   CHOLECYSTECTOMY       Social History:   Social History   Tobacco Use   Smoking status: Never   Smokeless tobacco: Never  Substance Use Topics   Alcohol use: No     Lives - At home   Family History :   Family History  Problem Relation Age of Onset   Sickle cell anemia Mother    Sickle cell trait Father    Sickle cell trait Sister    Sickle cell trait Brother    Cancer Maternal Grandmother      Home Medications:   Prior to Admission medications   Medication Sig Start Date End Date Taking? Authorizing Provider  folic acid (FOLVITE) A999333 MCG tablet Take 400 mcg by mouth daily.   Yes [provider]  hydroxyurea (HYDREA) 500 MG capsule May take with food to minimize GI side effects. Patient taking differently: Take 500 mg by mouth 2 (two) times daily. May take with food to minimize GI side effects. 01/16/20  Yes Vevelyn Francois, NP  oxyCODONE-acetaminophen (PERCOCET) 10-325 MG tablet Take 1 tablet by mouth every 4 (four) hours as needed for pain.   Yes [provider]  cholecalciferol (VITAMIN D3) 25 MCG (1000 UNIT) tablet Take  1,000 Units by mouth daily.    [provider]     Allergies:   No Known Allergies   Physical Exam:   Vitals:   Vitals:   04/28/21 0335 04/28/21 0406  BP: 122/77   Pulse: 93   Resp: 20 18  Temp: 98.7 F (37.1 C)   SpO2: 99% 100%    Physical Exam: Constitutional: Patient appears well-developed and well-nourished. Not in obvious distress. HENT: Normocephalic, atraumatic, External right and left ear normal. Oropharynx is clear and moist.  Eyes: Conjunctivae and EOM are normal. PERRLA, no scleral icterus. Neck: Normal  ROM. Neck supple. No JVD. No tracheal deviation. No thyromegaly. CVS: RRR, S1/S2 +, no murmurs, no gallops, no carotid bruit.  Pulmonary: Effort and breath sounds normal, no stridor, rhonchi, wheezes, rales.  Abdominal: Soft. BS +, no distension, tenderness, rebound or guarding.  Musculoskeletal: Normal range of motion. No edema and no tenderness.  Lymphadenopathy: No lymphadenopathy noted, cervical, inguinal or axillary Neuro: Alert. Normal reflexes, muscle tone coordination. No cranial nerve deficit. Skin: Skin is warm and dry. No rash noted. Not diaphoretic. No erythema. No pallor. Psychiatric: Normal mood and affect. Behavior, judgment, thought content normal.   Data Review:   CBC Recent Labs  Lab 04/27/21 1318 04/28/21 0433  WBC 7.2 14.8*  HGB 8.7* 8.3*  HCT 24.4* 23.1*  PLT 362 311  MCV 86.5 85.2  MCH 30.9 30.6  MCHC 35.7 35.9  RDW 18.6* 18.3*  LYMPHSABS 2.2  --   MONOABS 0.5  --   EOSABS 0.1  --   BASOSABS 0.1  --    ------------------------------------------------------------------------------------------------------------------  Chemistries  Recent Labs  Lab 04/27/21 1318 04/28/21 0433  NA 140 135  K 3.9 4.0  CL 105 100  CO2 25 26  GLUCOSE 96 102*  BUN 6 8  CREATININE 0.56* 0.62  CALCIUM 9.1 9.0  AST 29  --   ALT 20  --   ALKPHOS 68  --   BILITOT 3.3*  --    ------------------------------------------------------------------------------------------------------------------ estimated creatinine clearance is 120.4 mL/min (by C-G formula based on SCr of 0.62 mg/dL). ------------------------------------------------------------------------------------------------------------------ No results for input(s): TSH, T4TOTAL, T3FREE, THYROIDAB in the last 72 hours.  Invalid input(s): FREET3  Coagulation profile No results for input(s): INR, PROTIME in the last 168  hours. ------------------------------------------------------------------------------------------------------------------- No results for input(s): DDIMER in the last 72 hours. -------------------------------------------------------------------------------------------------------------------  Cardiac Enzymes No results for input(s): CKMB, TROPONINI, MYOGLOBIN in the last 168 hours.  Invalid input(s): CK ------------------------------------------------------------------------------------------------------------------ No results found for: BNP  ---------------------------------------------------------------------------------------------------------------  Urinalysis    Component Value Date/Time   COLORURINE YELLOW 09/13/2018 [place] 09/13/2018 1526   LABSPEC 1.011 09/13/2018 1526   PHURINE 6.0 09/13/2018 1526   GLUCOSEU NEGATIVE 09/13/2018 1526   HGBUR SMALL (A) 09/13/2018 1526   BILIRUBINUR neg 02/01/2021 1402   KETONESUR negative 07/17/2020 [place] 09/13/2018 1526   PROTEINUR Negative 02/01/2021 1402   PROTEINUR NEGATIVE 09/13/2018 1526   UROBILINOGEN 0.2 02/01/2021 1402   UROBILINOGEN 4.0 (H) 08/23/2017 1051   NITRITE neg 02/01/2021 1402   NITRITE NEGATIVE 09/13/2018 1526   LEUKOCYTESUR Negative 02/01/2021 1402    ----------------------------------------------------------------------------------------------------------------   Imaging Results:    No results found.   Assessment & Plan:  Principal Problem:   Sickle cell pain crisis (HCC) Active Problems:   Chronic pain syndrome   Anemia of chronic disease   Sickle cell disease with pain crisis: Admit patient, start IVF 0 .45% Saline @ 100 mls/hour,  start weight based Dilaudid PCA, start IV Toradol 15 mg Q 6 H, Restart oral home pain medications, Monitor vitals very closely, Re-evaluate pain scale regularly, 2 L of Oxygen by [place], Patient will be re-evaluated for pain in the context of  function and relationship to baseline as care progresses.  Anemia of chronic disease:  8.9 g/dL, consistent with patient's baseline.  There is no clinical indication for blood transfusion at this time.  Continue to follow closely.  Chronic pain syndrome: Continue Percocet 10-325 mg every 4 hours as needed for breakthrough pain.  DVT Prophylaxis: Subcut Lovenox and SCDs  AM Labs Ordered, also please review Full Orders  Family Communication: Admission, patient's condition and plan of care including tests being ordered have been discussed with the patient who indicate understanding and agree with the plan and Code Status.  Code Status: Full Code  Consults called: None    Admission status: Inpatient    Time spent in minutes : 30 minutes [place], MSN, FNP-C Patient [facility] [address] [place], [place] [address] [phone]  04/28/2021 at 5:58 AM

## 2021-04-29 DIAGNOSIS — D5700 Hb-SS disease with crisis, unspecified: Secondary | ICD-10-CM | POA: Diagnosis not present

## 2021-04-29 LAB — BASIC METABOLIC PANEL
Anion gap: 10 (ref 5–15)
BUN: 8 mg/dL (ref 6–20)
CO2: 27 mmol/L (ref 22–32)
Calcium: 8.9 mg/dL (ref 8.9–10.3)
Chloride: 97 mmol/L — ABNORMAL LOW (ref 98–111)
Creatinine, Ser: 0.66 mg/dL (ref 0.61–1.24)
GFR, Estimated: >60 mL/min (ref >60)
Glucose, Bld: 100 mg/dL — ABNORMAL HIGH (ref 70–99)
Potassium: 3.7 mmol/L (ref 3.5–5.1)
Sodium: 134 mmol/L — ABNORMAL LOW (ref 135–145)

## 2021-04-29 LAB — CBC
HCT: 22.7 % — ABNORMAL LOW (ref 39.0–52.0)
Hemoglobin: 8.2 g/dL — ABNORMAL LOW (ref 13.0–17.0)
MCH: 31.1 pg (ref 26.0–34.0)
MCHC: 36.1 g/dL — ABNORMAL HIGH (ref 30.0–36.0)
MCV: 86 fL (ref 80.0–100.0)
Platelets: 208 10*3/uL (ref 150–400)
RBC: 2.64 MIL/uL — ABNORMAL LOW (ref 4.22–5.81)
RDW: 18 % — ABNORMAL HIGH (ref 11.5–15.5)
WBC: 11.1 10*3/uL — ABNORMAL HIGH (ref 4.0–10.5)
nRBC: 17.3 % — ABNORMAL HIGH (ref 0.0–0.2)

## 2021-04-29 MED ORDER — MUSCLE RUB 10-15 % EX CREA
TOPICAL_CREAM | CUTANEOUS | Status: DC | PRN
  Filled 2021-04-29: qty 85

## 2021-04-29 NOTE — Progress Notes (Addendum)
Subjective: [name] is a 41 year old male with a medical history significant for sickle cell disease, chronic pain syndrome, opiate dependence and tolerance, and history of anemia of chronic disease that was admitted for sickle cell pain crisis. Patient says that pain intensity has improved some overnight.  He rates his pain as 7/10 primarily to low back and lower extremities.  Patient denies any headache, dizziness, chest pain, urinary symptoms, nausea, vomiting, or diarrhea. Objective:  Vital signs in last 24 hours:  Vitals:   04/29/21 0020 04/29/21 0243 04/29/21 0423 04/29/21 0807  BP:   122/75   Pulse:   90   Resp: '20 20 18 18  '$ Temp:   98.8 F (37.1 C)   TempSrc:      SpO2: 95% 95% 99% 97%  Weight:      Height:        Intake/Output from previous day:   Intake/Output Summary (Last 24 hours) at 04/29/2021 0858 Last data filed at 04/29/2021 0425 Gross per 24 hour  Intake 2094.16 ml  Output 1125 ml  Net 969.16 ml    Physical Exam: General: Alert, awake, oriented x3, in no acute distress.  HEENT: [place]/AT [place]ERL, EOMI Neck: Trachea midline,  no masses, no thyromegal,y no JVD, no carotid bruit OROPHARYNX:  Moist, No exudate/ erythema/lesions.  Heart: Regular rate and rhythm, without murmurs, rubs, gallops, PMI non-displaced, no heaves or thrills on palpation.  Lungs: Clear to auscultation, no wheezing or rhonchi noted. No increased vocal fremitus resonant to percussion  Abdomen: Soft, nontender, nondistended, positive bowel sounds, no masses no hepatosplenomegaly noted..  Neuro: No focal neurological deficits noted cranial nerves II through XII grossly intact. DTRs 2+ bilaterally upper and lower extremities. Strength 5 out of 5 in bilateral upper and lower extremities. Musculoskeletal: No warm swelling or erythema around joints, no spinal tenderness noted. Psychiatric: Patient alert and oriented x3, good insight and cognition, good recent to remote recall. Lymph node survey:  No cervical axillary or inguinal lymphadenopathy noted.  Lab Results:  Basic Metabolic Panel:    Component Value Date/Time   NA 134 (L) 04/29/2021 0510   NA 140 02/01/2021 1505   K 3.7 04/29/2021 0510   CL 97 (L) 04/29/2021 0510   CO2 27 04/29/2021 0510   BUN 8 04/29/2021 0510   BUN 8 02/01/2021 1505   CREATININE 0.66 04/29/2021 0510   CREATININE 0.76 03/08/2017 1047   GLUCOSE 100 (H) 04/29/2021 0510   CALCIUM 8.9 04/29/2021 0510   CBC:    Component Value Date/Time   WBC 11.1 (H) 04/29/2021 0510   HGB 8.2 (L) 04/29/2021 0510   HGB 9.0 (L) 02/01/2021 1505   HCT 22.7 (L) 04/29/2021 0510   HCT 25.8 (L) 02/01/2021 1505   PLT 208 04/29/2021 0510   PLT 341 02/01/2021 1505   MCV 86.0 04/29/2021 0510   MCV 88 02/01/2021 1505   NEUTROABS 4.0 04/27/2021 1318   NEUTROABS 4.2 02/01/2021 1505   LYMPHSABS 2.2 04/27/2021 1318   LYMPHSABS 4.2 (H) 02/01/2021 1505   MONOABS 0.5 04/27/2021 1318   EOSABS 0.1 04/27/2021 1318   EOSABS 0.2 02/01/2021 1505   BASOSABS 0.1 04/27/2021 1318   BASOSABS 0.1 02/01/2021 1505    Recent Results (from the past 240 hour(s))  SARS CORONAVIRUS 2 (TAT 6-24 HRS) Nasopharyngeal Nasopharyngeal Swab     Status: None   Collection Time: 04/27/21  5:57 PM   Specimen: Nasopharyngeal Swab  Result Value Ref Range Status   SARS Coronavirus 2 NEGATIVE NEGATIVE Final  Comment: (NOTE) SARS-CoV-2 target nucleic acids are NOT DETECTED.  The SARS-CoV-2 RNA is generally detectable in upper and lower respiratory specimens during the acute phase of infection. Negative results do not preclude SARS-CoV-2 infection, do not rule out co-infections with other pathogens, and should not be used as the sole basis for treatment or other patient management decisions. Negative results must be combined with clinical observations, patient history, and epidemiological information. The expected result is Negative.  Fact Sheet for  Patients: SugarRoll.be  Fact Sheet for Healthcare Providers: https://www.woods-mathews.com/  This test is not yet approved or cleared by the [place] FDA and  has been authorized for detection and/or diagnosis of SARS-CoV-2 by FDA under an Emergency Use Authorization (EUA). This EUA will remain  in effect (meaning this test can be used) for the duration of the COVID-19 declaration under Se ction 564(b)(1) of the Act, 21 U.S.C. section 360bbb-3(b)(1), unless the authorization is terminated or revoked sooner.  Performed at [facility], [place] [address], [place], [place] [address]     Studies/Results: No results found.  Medications: Scheduled Meds:  cholecalciferol  1,000 Units Oral Daily   enoxaparin (LOVENOX) injection  40 mg Subcutaneous [id number]   folic acid  0.5 mg Oral Daily   HYDROmorphone   Intravenous Q4H   hydroxyurea  500 mg Oral BID   ketorolac  15 mg Intravenous Q6H   senna-docusate  1 tablet Oral BID   Continuous Infusions:  sodium chloride 150 mL/hr at 04/28/21 1743   PRN Meds:.diphenhydrAMINE, naloxone **AND** sodium chloride flush, ondansetron (ZOFRAN) IV, oxyCODONE-acetaminophen **AND** oxyCODONE, polyethylene glycol  Consultants: none  Procedures: none  Antibiotics: none  Assessment/Plan: Principal Problem:   Sickle cell pain crisis (HCC) Active Problems:   Chronic pain syndrome   Anemia of chronic disease  Sickle cell disease with pain crisis: Continue IV Dilaudid PCA without any changes today Percocet 10-3 25 every 4 hours as needed for severe breakthrough pain IV Toradol 15 mg every 6 hours for total of 5 days Monitor vital signs very closely, reevaluate pain scale regularly, and supplemental oxygen as needed Possible discharge on 04/30/2021  Leukocytosis: Stable.  More than likely secondary to sickle cell crisis.  Patient continues to be afebrile.  Follow closely.  Labs in AM.  Chronic pain  syndrome: Continue home medications  Code Status: Full Code Family Communication: N/A Disposition Plan: Not yet ready for discharge  Talty, MSN, FNP-C Patient [place] [address] [place], [place] [address] [phone]  If 5PM-8AM, please contact night-coverage.  04/29/2021, 8:58 AM  LOS: 1 day

## 2021-04-30 DIAGNOSIS — D638 Anemia in other chronic diseases classified elsewhere: Secondary | ICD-10-CM | POA: Diagnosis not present

## 2021-04-30 DIAGNOSIS — D5700 Hb-SS disease with crisis, unspecified: Secondary | ICD-10-CM | POA: Diagnosis not present

## 2021-04-30 DIAGNOSIS — G894 Chronic pain syndrome: Secondary | ICD-10-CM | POA: Diagnosis not present

## 2021-04-30 LAB — URINALYSIS, ROUTINE W REFLEX MICROSCOPIC
Bilirubin Urine: NEGATIVE
Glucose, UA: NEGATIVE mg/dL
Ketones, ur: 20 mg/dL — AB
Leukocytes,Ua: NEGATIVE
Nitrite: NEGATIVE
Protein, ur: 30 mg/dL — AB
Specific Gravity, Urine: 1.012 (ref 1.005–1.030)
pH: 5 (ref 5.0–8.0)

## 2021-04-30 NOTE — Progress Notes (Signed)
Patient ID: [name], male   DOB: 05/25/1981, 40 y.o.   MRN: [id number] Subjective: Subjective: [name] is a 40 year old male with a medical history significant for sickle cell disease, chronic pain syndrome, opiate dependence and tolerance, and history of anemia of chronic disease that was admitted for sickle cell pain crisis.  Patient says his pain is still at 6/10, although he feels a little better than yesterday but he thinks he needs another day in the hospital.  Pain is localized to his lower back and lower extremities.  He denies any fever, cough, dizziness, shortness of breath, nausea, vomiting or diarrhea.  No urinary symptoms.  Objective:  Vital signs in last 24 hours:  Vitals:   04/30/21 0006 04/30/21 0358 04/30/21 0737 04/30/21 0810  BP:   114/66   Pulse:   88   Resp: 10 18 (!) 22 15  Temp:   99.3 F (37.4 C)   TempSrc:      SpO2: 96% 94% 100% 99%  Weight:      Height:        Intake/Output from previous day:   Intake/Output Summary (Last 24 hours) at 04/30/2021 1108 Last data filed at 04/30/2021 1012 Gross per 24 hour  Intake 715 ml  Output 875 ml  Net -160 ml    Physical Exam: General: Alert, awake, oriented x3, in no acute distress.  HEENT: [place]/AT PEERL, EOMI Neck: Trachea midline,  no masses, no thyromegal,y no JVD, no carotid bruit OROPHARYNX:  Moist, No exudate/ erythema/lesions.  Heart: Regular rate and rhythm, without murmurs, rubs, gallops, PMI non-displaced, no heaves or thrills on palpation.  Lungs: Clear to auscultation, no wheezing or rhonchi noted. No increased vocal fremitus resonant to percussion  Abdomen: Soft, nontender, nondistended, positive bowel sounds, no masses no hepatosplenomegaly noted..  Neuro: No focal neurological deficits noted cranial nerves II through XII grossly intact. DTRs 2+ bilaterally upper and lower extremities. Strength 5 out of 5 in bilateral upper and lower extremities. Musculoskeletal: No warm swelling or  erythema around joints, no spinal tenderness noted. Psychiatric: Patient alert and oriented x3, good insight and cognition, good recent to remote recall. Lymph node survey: No cervical axillary or inguinal lymphadenopathy noted.  Lab Results:  Basic Metabolic Panel:    Component Value Date/Time   NA 134 (L) 04/29/2021 0510   NA 140 02/01/2021 1505   K 3.7 04/29/2021 0510   CL 97 (L) 04/29/2021 0510   CO2 27 04/29/2021 0510   BUN 8 04/29/2021 0510   BUN 8 02/01/2021 1505   CREATININE 0.66 04/29/2021 0510   CREATININE 0.76 03/08/2017 1047   GLUCOSE 100 (H) 04/29/2021 0510   CALCIUM 8.9 04/29/2021 0510   CBC:    Component Value Date/Time   WBC 11.1 (H) 04/29/2021 0510   HGB 8.2 (L) 04/29/2021 0510   HGB 9.0 (L) 02/01/2021 1505   HCT 22.7 (L) 04/29/2021 0510   HCT 25.8 (L) 02/01/2021 1505   PLT 208 04/29/2021 0510   PLT 341 02/01/2021 1505   MCV 86.0 04/29/2021 0510   MCV 88 02/01/2021 1505   NEUTROABS 4.0 04/27/2021 1318   NEUTROABS 4.2 02/01/2021 1505   LYMPHSABS 2.2 04/27/2021 1318   LYMPHSABS 4.2 (H) 02/01/2021 1505   MONOABS 0.5 04/27/2021 1318   EOSABS 0.1 04/27/2021 1318   EOSABS 0.2 02/01/2021 1505   BASOSABS 0.1 04/27/2021 1318   BASOSABS 0.1 02/01/2021 1505    Recent Results (from the past 240 hour(s))  SARS CORONAVIRUS 2 (TAT 6-24 HRS)  Nasopharyngeal Nasopharyngeal Swab     Status: None   Collection Time: 04/27/21  5:57 PM   Specimen: Nasopharyngeal Swab  Result Value Ref Range Status   SARS Coronavirus 2 NEGATIVE NEGATIVE Final    Comment: (NOTE) SARS-CoV-2 target nucleic acids are NOT DETECTED.  The SARS-CoV-2 RNA is generally detectable in upper and lower respiratory specimens during the acute phase of infection. Negative results do not preclude SARS-CoV-2 infection, do not rule out co-infections with other pathogens, and should not be used as the sole basis for treatment or other patient management decisions. Negative results must be combined with  clinical observations, patient history, and epidemiological information. The expected result is Negative.  Fact Sheet for Patients: SugarRoll.be  Fact Sheet for Healthcare Providers: https://www.woods-mathews.com/  This test is not yet approved or cleared by the [place] FDA and  has been authorized for detection and/or diagnosis of SARS-CoV-2 by FDA under an Emergency Use Authorization (EUA). This EUA will remain  in effect (meaning this test can be used) for the duration of the COVID-19 declaration under Se ction 564(b)(1) of the Act, 21 U.S.C. section 360bbb-3(b)(1), unless the authorization is terminated or revoked sooner.  Performed at [facility], [place] [address], [place], [place] [address]     Studies/Results: No results found.  Medications: Scheduled Meds:  cholecalciferol  1,000 Units Oral Daily   enoxaparin (LOVENOX) injection  40 mg Subcutaneous [id number]   folic acid  0.5 mg Oral Daily   HYDROmorphone   Intravenous Q4H   hydroxyurea  500 mg Oral BID   ketorolac  15 mg Intravenous Q6H   senna-docusate  1 tablet Oral BID   Continuous Infusions:  sodium chloride 150 mL/hr at 04/28/21 1743   PRN Meds:.diphenhydrAMINE, Muscle Rub, naloxone **AND** sodium chloride flush, ondansetron (ZOFRAN) IV, oxyCODONE-acetaminophen **AND** oxyCODONE, polyethylene glycol  Consultants: None  Procedures: None  Antibiotics: None  Assessment/Plan: Principal Problem:   Sickle cell pain crisis (HCC) Active Problems:   Chronic pain syndrome   Anemia of chronic disease  Hb Sickle Cell Disease with crisis: Continue IVF at [facility], continue weight based Dilaudid PCA at current dose setting, continue IV Toradol 15 mg Q 6 H, continue oral home medications as ordered.  Monitor vitals very closely, Re-evaluate pain scale regularly, 2 L of Oxygen by [place]. Leukocytosis: Stable.  This is likely due to sickle cell crisis.  Patient is afebrile  and no symptoms of inflammation or infection.  We will continue to monitor without antibiotics. Sickle Cell Anemia: Hemoglobin is stable at baseline today.  We will continue to monitor very closely and transfuse as appropriate. Chronic pain Syndrome: Continue home medications.  Code Status: Full Code Family Communication: N/A Disposition Plan: Not yet ready for discharge  Ever Halberg  If 7PM-7AM, please contact night-coverage.  04/30/2021, 11:08 AM  LOS: 2 days

## 2021-05-01 ENCOUNTER — Inpatient Hospital Stay (HOSPITAL_COMMUNITY): Payer: Medicare Other

## 2021-05-01 DIAGNOSIS — R0789 Other chest pain: Secondary | ICD-10-CM

## 2021-05-01 DIAGNOSIS — D5700 Hb-SS disease with crisis, unspecified: Secondary | ICD-10-CM | POA: Diagnosis not present

## 2021-05-01 DIAGNOSIS — D638 Anemia in other chronic diseases classified elsewhere: Secondary | ICD-10-CM | POA: Diagnosis not present

## 2021-05-01 DIAGNOSIS — G894 Chronic pain syndrome: Secondary | ICD-10-CM | POA: Diagnosis not present

## 2021-05-01 LAB — CBC WITH DIFFERENTIAL/PLATELET
Abs Immature Granulocytes: 0.08 10*3/uL — ABNORMAL HIGH (ref 0.00–0.07)
Basophils Absolute: 0 10*3/uL (ref 0.0–0.1)
Basophils Relative: 0 %
Eosinophils Absolute: 0.1 10*3/uL (ref 0.0–0.5)
Eosinophils Relative: 2 %
HCT: 19 % — ABNORMAL LOW (ref 39.0–52.0)
Hemoglobin: 6.9 g/dL — CL (ref 13.0–17.0)
Immature Granulocytes: 1 %
Lymphocytes Relative: 21 %
Lymphs Abs: 1.6 10*3/uL (ref 0.7–4.0)
MCH: 30.7 pg (ref 26.0–34.0)
MCHC: 36.3 g/dL — ABNORMAL HIGH (ref 30.0–36.0)
MCV: 84.4 fL (ref 80.0–100.0)
Monocytes Absolute: 0.6 10*3/uL (ref 0.1–1.0)
Monocytes Relative: 7 %
Neutro Abs: 5.1 10*3/uL (ref 1.7–7.7)
Neutrophils Relative %: 69 %
Platelets: 235 10*3/uL (ref 150–400)
RBC: 2.25 MIL/uL — ABNORMAL LOW (ref 4.22–5.81)
RDW: 16.9 % — ABNORMAL HIGH (ref 11.5–15.5)
WBC: 7.5 10*3/uL (ref 4.0–10.5)
nRBC: 11 % — ABNORMAL HIGH (ref 0.0–0.2)

## 2021-05-01 LAB — BASIC METABOLIC PANEL
Anion gap: 10 (ref 5–15)
BUN: 8 mg/dL (ref 6–20)
CO2: 30 mmol/L (ref 22–32)
Calcium: 9 mg/dL (ref 8.9–10.3)
Chloride: 100 mmol/L (ref 98–111)
Creatinine, Ser: 0.68 mg/dL (ref 0.61–1.24)
GFR, Estimated: >60 mL/min (ref >60)
Glucose, Bld: 109 mg/dL — ABNORMAL HIGH (ref 70–99)
Potassium: 3.5 mmol/L (ref 3.5–5.1)
Sodium: 140 mmol/L (ref 135–145)

## 2021-05-01 LAB — PREPARE RBC (CROSSMATCH)

## 2021-05-01 MED ORDER — SODIUM CHLORIDE 0.9% IV SOLUTION: Freq: Once | INTRAVENOUS | Status: AC

## 2021-05-01 MED ORDER — SODIUM CHLORIDE 0.9 % IV SOLN
2.0000 g | Freq: Three times a day (TID) | INTRAVENOUS | Status: DC
  Administered 2021-05-01 – 2021-05-02 (×2): 2 g via INTRAVENOUS
  Filled 2021-05-01 (×6): qty 2

## 2021-05-01 NOTE — Progress Notes (Signed)
Pharmacy Antibiotic Note  [name] is a 40 y.o. male with sickle cell disease admitted on 04/27/2021 with pneumonia.  Pharmacy has been consulted for Cefepime dosing.  Plan: Cefepime 2g IV q8h Follow up renal function & cultures  Height: '6\' 2"'$  (188 cm) Weight: 68 kg (149 lb 14.6 oz) IBW/kg (Calculated) : 82.2  Temp (24hrs), Avg:99.2 F (37.3 C), Min:98.6 F (37 C), Max:99.8 F (37.7 C)  Recent Labs  Lab 04/27/21 1318 04/28/21 0433 04/29/21 0510 05/01/21 0532  WBC 7.2 14.8* 11.1* 7.5  CREATININE 0.56* 0.62 0.66 0.68    Estimated Creatinine Clearance: 120.4 mL/min (by C-G formula based on SCr of 0.68 mg/dL).    No Known Allergies  Antimicrobials this admission: 9/17 Cefepime >>  Dose adjustments this admission:  Microbiology results: 9/13 COVID: neg  Thank you for allowing pharmacy to be a part of this patient's care.  Peggyann Juba, PharmD, BCPS Pharmacy: [phone] 05/01/2021 6:52 PM

## 2021-05-01 NOTE — Progress Notes (Signed)
Patient ID: [name], male   DOB: 24-Feb-1981, 40 y.o.   MRN: [id number] Subjective: Subjective: [name] is a 40 year old male with a medical history significant for sickle cell disease, chronic pain syndrome, opiate dependence and tolerance, and history of anemia of chronic disease that was admitted for sickle cell pain crisis.  Patient claims he is feeling a little better today except new development of chest tightness especially when he tries to take a deep breath.  He is not coughing.  He is not short of breath.  No fever.  No nausea, vomiting or diarrhea.  He said his pain is at 5/10 and is manageable at home.  He would like to be discharged but wondered if he could get a chest x-ray done.  Objective:  Vital signs in last 24 hours:  Vitals:   05/01/21 1127 05/01/21 1143 05/01/21 1556 05/01/21 1706  BP: 113/73   113/68  Pulse: 85   80  Resp: '20 16 16 16  '$ Temp:    99 F (37.2 C)  TempSrc:      SpO2: 100% 98% 99% 100%  Weight:      Height:        Intake/Output from previous day:   Intake/Output Summary (Last 24 hours) at 05/01/2021 1852 Last data filed at 05/01/2021 1630 Gross per 24 hour  Intake 240 ml  Output --  Net 240 ml     Physical Exam: General: Alert, awake, oriented x3, in no acute distress.  HEENT: [place]/AT PEERL, EOMI Neck: Trachea midline,  no masses, no thyromegal,y no JVD, no carotid bruit OROPHARYNX:  Moist, No exudate/ erythema/lesions.  Heart: Regular rate and rhythm, without murmurs, rubs, gallops, PMI non-displaced, no heaves or thrills on palpation.  Lungs: Clear to auscultation, no wheezing or rhonchi noted. No increased vocal fremitus resonant to percussion  Abdomen: Soft, nontender, nondistended, positive bowel sounds, no masses no hepatosplenomegaly noted..  Neuro: No focal neurological deficits noted cranial nerves II through XII grossly intact. DTRs 2+ bilaterally upper and lower extremities. Strength 5 out of 5 in bilateral upper and lower  extremities. Musculoskeletal: No warm swelling or erythema around joints, no spinal tenderness noted. Psychiatric: Patient alert and oriented x3, good insight and cognition, good recent to remote recall. Lymph node survey: No cervical axillary or inguinal lymphadenopathy noted.  Lab Results:  Basic Metabolic Panel:    Component Value Date/Time   NA 140 05/01/2021 0532   NA 140 02/01/2021 1505   K 3.5 05/01/2021 0532   CL 100 05/01/2021 0532   CO2 30 05/01/2021 0532   BUN 8 05/01/2021 0532   BUN 8 02/01/2021 1505   CREATININE 0.68 05/01/2021 0532   CREATININE 0.76 03/08/2017 1047   GLUCOSE 109 (H) 05/01/2021 0532   CALCIUM 9.0 05/01/2021 0532   CBC:    Component Value Date/Time   WBC 7.5 05/01/2021 0532   HGB 6.9 (LL) 05/01/2021 0532   HGB 9.0 (L) 02/01/2021 1505   HCT 19.0 (L) 05/01/2021 0532   HCT 25.8 (L) 02/01/2021 1505   PLT 235 05/01/2021 0532   PLT 341 02/01/2021 1505   MCV 84.4 05/01/2021 0532   MCV 88 02/01/2021 1505   NEUTROABS 5.1 05/01/2021 0532   NEUTROABS 4.2 02/01/2021 1505   LYMPHSABS 1.6 05/01/2021 0532   LYMPHSABS 4.2 (H) 02/01/2021 1505   MONOABS 0.6 05/01/2021 0532   EOSABS 0.1 05/01/2021 0532   EOSABS 0.2 02/01/2021 1505   BASOSABS 0.0 05/01/2021 0532   BASOSABS 0.1 02/01/2021 1505  Recent Results (from the past 240 hour(s))  SARS CORONAVIRUS 2 (TAT 6-24 HRS) Nasopharyngeal Nasopharyngeal Swab     Status: None   Collection Time: 04/27/21  5:57 PM   Specimen: Nasopharyngeal Swab  Result Value Ref Range Status   SARS Coronavirus 2 NEGATIVE NEGATIVE Final    Comment: (NOTE) SARS-CoV-2 target nucleic acids are NOT DETECTED.  The SARS-CoV-2 RNA is generally detectable in upper and lower respiratory specimens during the acute phase of infection. Negative results do not preclude SARS-CoV-2 infection, do not rule out co-infections with other pathogens, and should not be used as the sole basis for treatment or other patient management  decisions. Negative results must be combined with clinical observations, patient history, and epidemiological information. The expected result is Negative.  Fact Sheet for Patients: SugarRoll.be  Fact Sheet for Healthcare Providers: https://www.woods-mathews.com/  This test is not yet approved or cleared by the [place] FDA and  has been authorized for detection and/or diagnosis of SARS-CoV-2 by FDA under an Emergency Use Authorization (EUA). This EUA will remain  in effect (meaning this test can be used) for the duration of the COVID-19 declaration under Se ction 564(b)(1) of the Act, 21 U.S.C. section 360bbb-3(b)(1), unless the authorization is terminated or revoked sooner.  Performed at [facility], [place] [address], [place], [place] [address]     Studies/Results: DG Chest 2 View  Result Date: 05/01/2021 CLINICAL DATA:  Chest tightness.  Sickle cell pain. EXAM: CHEST - 2 VIEW COMPARISON:  February 01, 2021 FINDINGS: There is opacity in the left retrocardiac region which is new in the interval. The heart, hila, mediastinum, lungs, and pleura are otherwise unremarkable. IMPRESSION: New infiltrate/opacity in the left retrocardiac region raising the possibility of acute chest syndrome in this sickle cell patient. Electronically Signed   By: Dorise Bullion III M.D.   On: 05/01/2021 16:42    Medications: Scheduled Meds:  sodium chloride   Intravenous Once   cholecalciferol  1,000 Units Oral Daily   enoxaparin (LOVENOX) injection  40 mg Subcutaneous [id number]   folic acid  0.5 mg Oral Daily   HYDROmorphone   Intravenous Q4H   hydroxyurea  500 mg Oral BID   ketorolac  15 mg Intravenous Q6H   senna-docusate  1 tablet Oral BID   Continuous Infusions:  sodium chloride 10 mL/hr at 04/30/21 1823   ceFEPime (MAXIPIME) IV     PRN Meds:.diphenhydrAMINE, Muscle Rub, naloxone **AND** sodium chloride flush, ondansetron (ZOFRAN) IV,  oxyCODONE-acetaminophen **AND** oxyCODONE, polyethylene glycol  Consultants: None  Procedures: None  Antibiotics: None  Assessment/Plan: Principal Problem:   Sickle cell pain crisis (HCC) Active Problems:   Chronic pain syndrome   Anemia of chronic disease  Hb Sickle Cell Disease with crisis: IVF at [facility], continue weight based Dilaudid PCA at current dose setting, continue IV Toradol 15 mg Q 6 H, continue oral home medications as ordered.  Monitor vitals very closely, Re-evaluate pain scale regularly, 2 L of Oxygen by [place]. Chest tightness: Chest x-ray ordered.  Chest x-ray showed possible new infiltrate or opacity in the left retrocardiac region wheezing the possibility of acute chest syndrome.  Patient has no other features of acute chest x-ray for the radiologic finding.  We will treat as pneumonia with IV cefepime.  Will hold off on discharge and see if patient improves in symptoms. Leukocytosis: Resolved.  Patient is now on antibiotics. Sickle Cell Anemia: Hemoglobin has dropped below baseline now to 6.9 from 8.2.  Will transfuse patient  with 1 unit of packed red blood cells today.  Monitor very closely.  Repeat labs in AM.   Chronic pain Syndrome: Continue home medications.  Code Status: Full Code Family Communication: N/A Disposition Plan: Not yet ready for discharge  Tanaja Ganger  If 7PM-7AM, please contact night-coverage.  05/01/2021, 6:52 PM  LOS: 3 days

## 2021-05-01 NOTE — Progress Notes (Signed)
Turned PCA pump off during PRBC infusion did not retrieve doses prior to start of infusion.

## 2021-05-02 ENCOUNTER — Encounter (HOSPITAL_COMMUNITY): Payer: Self-pay | Admitting: Family Medicine

## 2021-05-02 DIAGNOSIS — D5700 Hb-SS disease with crisis, unspecified: Secondary | ICD-10-CM | POA: Diagnosis not present

## 2021-05-02 DIAGNOSIS — G894 Chronic pain syndrome: Secondary | ICD-10-CM | POA: Diagnosis not present

## 2021-05-02 DIAGNOSIS — D638 Anemia in other chronic diseases classified elsewhere: Secondary | ICD-10-CM | POA: Diagnosis not present

## 2021-05-02 DIAGNOSIS — R0789 Other chest pain: Secondary | ICD-10-CM | POA: Diagnosis not present

## 2021-05-02 LAB — CBC WITH DIFFERENTIAL/PLATELET
Abs Immature Granulocytes: 0.05 10*3/uL (ref 0.00–0.07)
Basophils Absolute: 0 10*3/uL (ref 0.0–0.1)
Basophils Relative: 0 %
Eosinophils Absolute: 0.3 10*3/uL (ref 0.0–0.5)
Eosinophils Relative: 4 %
HCT: 23.3 % — ABNORMAL LOW (ref 39.0–52.0)
Hemoglobin: 8.2 g/dL — ABNORMAL LOW (ref 13.0–17.0)
Immature Granulocytes: 1 %
Lymphocytes Relative: 34 %
Lymphs Abs: 2.7 10*3/uL (ref 0.7–4.0)
MCH: 29.8 pg (ref 26.0–34.0)
MCHC: 35.2 g/dL (ref 30.0–36.0)
MCV: 84.7 fL (ref 80.0–100.0)
Monocytes Absolute: 0.4 10*3/uL (ref 0.1–1.0)
Monocytes Relative: 6 %
Neutro Abs: 4.4 10*3/uL (ref 1.7–7.7)
Neutrophils Relative %: 55 %
Platelets: 271 10*3/uL (ref 150–400)
RBC: 2.75 MIL/uL — ABNORMAL LOW (ref 4.22–5.81)
RDW: 16.4 % — ABNORMAL HIGH (ref 11.5–15.5)
WBC: 7.8 10*3/uL (ref 4.0–10.5)
nRBC: 10.2 % — ABNORMAL HIGH (ref 0.0–0.2)

## 2021-05-02 LAB — BASIC METABOLIC PANEL
Anion gap: 13 (ref 5–15)
BUN: 9 mg/dL (ref 6–20)
CO2: 29 mmol/L (ref 22–32)
Calcium: 9.1 mg/dL (ref 8.9–10.3)
Chloride: 98 mmol/L (ref 98–111)
Creatinine, Ser: 0.74 mg/dL (ref 0.61–1.24)
GFR, Estimated: >60 mL/min (ref >60)
Glucose, Bld: 124 mg/dL — ABNORMAL HIGH (ref 70–99)
Potassium: 4.1 mmol/L (ref 3.5–5.1)
Sodium: 140 mmol/L (ref 135–145)

## 2021-05-02 MED ORDER — AMOXICILLIN-POT CLAVULANATE 875-125 MG PO TABS: 1.0000 | ORAL_TABLET | Freq: Two times a day (BID) | ORAL | 0 refills | Status: AC

## 2021-05-02 NOTE — Discharge Summary (Signed)
Physician Discharge Summary  [name] DOB: Nov 13, 1980 DOA: 04/27/2021  PCP: Vevelyn Francois, NP  Admit date: 04/27/2021  Discharge date: 05/02/2021  Discharge Diagnoses:  Principal Problem:   Sickle cell pain crisis ([place]) Active Problems:   Chronic pain syndrome   Anemia of chronic disease   Chest tightness   Discharge Condition: Stable  Disposition:   Follow-up Information     Vevelyn Francois, NP. Schedule an appointment as soon as possible for a visit in 1 week(s).   Specialty: Adult Health Nurse Practitioner Why: Repeat [facility] Contact information: [address] [name] [address] [phone]                Pt is discharged home in good condition and is to follow up with Vevelyn Francois, NP this week to have labs evaluated. [name] is instructed to increase activity slowly and balance with rest for the next few days, and use prescribed medication to complete treatment of pain  Diet: Regular Wt Readings from Last 3 Encounters:  04/27/21 68 kg  02/06/21 62.4 kg  02/01/21 65.8 kg    History of present illness:  [name] is a 40 year old male with a medical history significant for sickle cell disease, chronic pain syndrome, opiate dependence and tolerance, and history of anemia of chronic disease presented to sickle cell day clinic for low back pain.  Low back pain is consistent with patient's typical sickle cell pain crisis.  He states that pain intensity has been increased over the past 24 hours and was unrelieved by his home medications.  He awakened suddenly with sharp, constant back pain.  He has not identified any palliating or provocative factors concerning crisis.  He took oxycodone without any relief.  He currently rates his pain at 8/10.  He denies any headache, chest pain, shortness of breath, urinary symptoms, nausea, vomiting, or diarrhea.  No dizziness or paresthesias.  No sick contacts, recent travel, or exposure to  COVID-19.  Sickle cell day infusion center course: Vital signs remained stable throughout admission.  Complete metabolic panel shows an elevated bilirubin of 3.3, otherwise unremarkable.  Complete blood count shows hemoglobin of 8.9 g/dL, consistent with patient's baseline, otherwise unremarkable.  Robust reticulocytosis.  Pain persists despite IV fluids, IV Toradol, and IV Dilaudid PCA.  Admit to [facility] and observation for further management of sickle cell pain crisis.  Hospital Course:  Patient was admitted for sickle cell pain crisis and managed appropriately with IVF, IV Dilaudid via PCA and IV Toradol, as well as other adjunct therapies per sickle cell pain management protocols.  Patient's pain slowly improved with above regimen.  Hemoglobin dropped to a nadir of 6.9 for which she was transfused with 1 unit of packed red blood cell.  His chest x-ray on 05/01/2021 showed retrocardiac disease versus atelectasis, patient had no other symptoms except for chest tightness.  He was given antibiotics IV and transitioned to oral Augmentin for 7 days.  As at today, patient claims he is doing really well especially post transfusion, he is ambulating well with no significant pain, tolerating p.o. intake with no restrictions.  He is breathing well, ambulatory SPO2 was 96% on room air.  He remained hemodynamically stable throughout this admission.  Patient requested to be discharged as he feels at his baseline and can manage at home at this point.  Patient was therefore discharged home today in a hemodynamically stable condition.   [name] will follow-up with PCP within  1 week of this discharge. [name] was counseled extensively about nonpharmacologic means of pain management, patient verbalized understanding and was appreciative of  the care received during this admission.   We discussed the need for good hydration, monitoring of hydration status, avoidance of heat, cold, stress, and infection triggers. We  discussed the need to be adherent with taking Hydrea and other home medications. Patient was reminded of the need to seek medical attention immediately if any symptom of bleeding, anemia, or infection occurs.  Discharge Exam: Vitals:   05/02/21 0807 05/02/21 0929  BP:  118/73  Pulse:  64  Resp: 17 20  Temp:  98.7 F (37.1 C)  SpO2: 100% 100%   Vitals:   05/02/21 0125 05/02/21 0504 05/02/21 0807 05/02/21 0929  BP: 106/60 113/72  118/73  Pulse: 76 73  64  Resp: '12 13 17 20  '$ Temp: 99.2 F (37.3 C) 98.4 F (36.9 C)  98.7 F (37.1 C)  TempSrc: Oral Oral  Oral  SpO2: 99% 100% 100% 100%  Weight:      Height:        General appearance : Awake, alert, not in any distress. Speech Clear. Not toxic looking HEENT: Atraumatic and Normocephalic, pupils equally reactive to light and accomodation Neck: Supple, no JVD. No cervical lymphadenopathy.  Chest: Good air entry bilaterally, no added sounds  CVS: S1 S2 regular, no murmurs.  Abdomen: Bowel sounds present, Non tender and not distended with no gaurding, rigidity or rebound. Extremities: B/L Lower Ext shows no edema, both legs are warm to touch Neurology: Awake alert, and oriented X 3, CN II-XII intact, Non focal Skin: No Rash  Discharge Instructions  Discharge Instructions     Diet - low sodium heart healthy   Complete by: As directed    Increase activity slowly   Complete by: As directed       Allergies as of 05/02/2021   No Known Allergies      Medication List     TAKE these medications    amoxicillin-clavulanate 875-125 MG tablet Commonly known as: Augmentin Take 1 tablet by mouth every 12 (twelve) hours for 7 days.   cholecalciferol 25 MCG (1000 UNIT) tablet Commonly known as: VITAMIN D3 Take 1,000 Units by mouth daily.   folic acid A999333 MCG tablet Commonly known as: FOLVITE Take 400 mcg by mouth daily.   hydroxyurea 500 MG capsule Commonly known as: HYDREA May take with food to minimize GI side  effects. What changed:  how much to take how to take this when to take this   oxyCODONE-acetaminophen 10-325 MG tablet Commonly known as: PERCOCET Take 1 tablet by mouth every 4 (four) hours as needed for pain.        The results of significant diagnostics from this hospitalization (including imaging, microbiology, ancillary and laboratory) are listed below for reference.    Significant Diagnostic Studies: DG Chest 2 View  Result Date: 05/01/2021 CLINICAL DATA:  Chest tightness.  Sickle cell pain. EXAM: CHEST - 2 VIEW COMPARISON:  February 01, 2021 FINDINGS: There is opacity in the left retrocardiac region which is new in the interval. The heart, hila, mediastinum, lungs, and pleura are otherwise unremarkable. IMPRESSION: New infiltrate/opacity in the left retrocardiac region raising the possibility of acute chest syndrome in this sickle cell patient. Electronically Signed   By: Dorise Bullion III M.D.   On: 05/01/2021 16:42    Microbiology: Recent Results (from the past 240 hour(s))  SARS CORONAVIRUS 2 (TAT 6-24 HRS)  Nasopharyngeal Nasopharyngeal Swab     Status: None   Collection Time: 04/27/21  5:57 PM   Specimen: Nasopharyngeal Swab  Result Value Ref Range Status   SARS Coronavirus 2 NEGATIVE NEGATIVE Final    Comment: (NOTE) SARS-CoV-2 target nucleic acids are NOT DETECTED.  The SARS-CoV-2 RNA is generally detectable in upper and lower respiratory specimens during the acute phase of infection. Negative results do not preclude SARS-CoV-2 infection, do not rule out co-infections with other pathogens, and should not be used as the sole basis for treatment or other patient management decisions. Negative results must be combined with clinical observations, patient history, and epidemiological information. The expected result is Negative.  Fact Sheet for Patients: SugarRoll.be  Fact Sheet for Healthcare  Providers: https://www.woods-mathews.com/  This test is not yet approved or cleared by the [place] FDA and  has been authorized for detection and/or diagnosis of SARS-CoV-2 by FDA under an Emergency Use Authorization (EUA). This EUA will remain  in effect (meaning this test can be used) for the duration of the COVID-19 declaration under Se ction 564(b)(1) of the Act, 21 U.S.C. section 360bbb-3(b)(1), unless the authorization is terminated or revoked sooner.  Performed at [facility], [place] [address] [place],  [address]      Labs: Basic Metabolic Panel: Recent Labs  Lab 04/27/21 1318 04/28/21 0433 04/29/21 0510 05/01/21 0532 05/02/21 0544  NA 140 135 134* 140 140  K 3.9 4.0 3.7 3.5 4.1  CL 105 100 97* 100 98  CO2 '25 26 27 30 29  '$ GLUCOSE 96 102* 100* 109* 124*  BUN '6 8 8 8 9  '$ CREATININE 0.56* 0.62 0.66 0.68 0.74  CALCIUM 9.1 9.0 8.9 9.0 9.1   Liver Function Tests: Recent Labs  Lab 04/27/21 1318  AST 29  ALT 20  ALKPHOS 68  BILITOT 3.3*  PROT 7.9  ALBUMIN 4.4   No results for input(s): LIPASE, AMYLASE in the last 168 hours. No results for input(s): AMMONIA in the last 168 hours. CBC: Recent Labs  Lab 04/27/21 1318 04/28/21 0433 04/29/21 0510 05/01/21 0532 05/02/21 0544  WBC 7.2 14.8* 11.1* 7.5 7.8  NEUTROABS 4.0  --   --  5.1 4.4  HGB 8.7* 8.3* 8.2* 6.9* 8.2*  HCT 24.4* 23.1* 22.7* 19.0* 23.3*  MCV 86.5 85.2 86.0 84.4 84.7  PLT 362 311 208 235 271   Cardiac Enzymes: No results for input(s): CKTOTAL, CKMB, CKMBINDEX, TROPONINI in the last 168 hours. BNP: Invalid input(s): POCBNP CBG: No results for input(s): GLUCAP in the last 168 hours.  Time coordinating discharge: 50 minutes  Signed:  [facility] 05/02/2021, 12:28 PM

## 2021-05-02 NOTE — Progress Notes (Signed)
Patient ambulated in the hallway on RA, 02 sat 96%.  Jegede notified, d/c orders provided. Discharge instructions provided to patient and he verbalized understanding.

## 2021-05-02 NOTE — Plan of Care (Deleted)
  Problem: Tissue Perfusion: Goal: Complications related to inadequate tissue perfusion will be avoided or minimized Outcome: Not Met (add Reason)   Problem: Tissue Perfusion: Goal: Complications related to inadequate tissue perfusion will be avoided or minimized Outcome: Not Met (add Reason)

## 2021-05-03 ENCOUNTER — Telehealth: Payer: Self-pay

## 2021-05-03 ENCOUNTER — Other Ambulatory Visit: Payer: Self-pay | Admitting: Nurse Practitioner

## 2021-05-03 DIAGNOSIS — Z79891 Long term (current) use of opiate analgesic: Secondary | ICD-10-CM

## 2021-05-03 DIAGNOSIS — D571 Sickle-cell disease without crisis: Secondary | ICD-10-CM

## 2021-05-03 LAB — TYPE AND SCREEN
ABO/RH(D): O POS
Antibody Screen: NEGATIVE
Unit division: 0

## 2021-05-03 LAB — BPAM RBC
Blood Product Expiration Date: 202210182359
ISSUE DATE / TIME: 202209172213
Unit Type and Rh: 5100

## 2021-05-03 MED ORDER — OXYCODONE-ACETAMINOPHEN 10-325 MG PO TABS: 1.0000 | ORAL_TABLET | ORAL | 0 refills | Status: DC | PRN

## 2021-05-03 NOTE — Telephone Encounter (Signed)
Percocet

## 2021-05-05 ENCOUNTER — Ambulatory Visit (INDEPENDENT_AMBULATORY_CARE_PROVIDER_SITE_OTHER): Payer: Medicare Other | Admitting: Nurse Practitioner

## 2021-05-05 ENCOUNTER — Other Ambulatory Visit: Payer: Self-pay

## 2021-05-05 ENCOUNTER — Encounter: Payer: Self-pay | Admitting: Nurse Practitioner

## 2021-05-05 VITALS — BP 101/62 | HR 69 | Temp 97.8°F | Ht 73.0 in | Wt 142.6 lb

## 2021-05-05 DIAGNOSIS — Z79891 Long term (current) use of opiate analgesic: Secondary | ICD-10-CM | POA: Diagnosis not present

## 2021-05-05 DIAGNOSIS — G894 Chronic pain syndrome: Secondary | ICD-10-CM | POA: Diagnosis not present

## 2021-05-05 DIAGNOSIS — D571 Sickle-cell disease without crisis: Secondary | ICD-10-CM

## 2021-05-05 LAB — POCT URINALYSIS DIP (CLINITEK)
Bilirubin, UA: NEGATIVE
Blood, UA: NEGATIVE
Glucose, UA: NEGATIVE mg/dL
Ketones, POC UA: NEGATIVE mg/dL
Leukocytes, UA: NEGATIVE
Nitrite, UA: NEGATIVE
POC PROTEIN,UA: NEGATIVE
Spec Grav, UA: 1.015 (ref 1.010–1.025)
Urobilinogen, UA: 4 E.U./dL — AB
pH, UA: 6 (ref 5.0–8.0)

## 2021-05-05 NOTE — Progress Notes (Signed)
[name], [place]  [address] Phone:  [phone]   Fax:  [phone]   Established Patient Office Visit  Subjective:  Patient ID: [name], male    DOB: [birth year]  Age: 40 y.o. MRN: [id number]  CC:  Chief Complaint  Patient presents with   Follow-up    Pt is here today for his hospital follow up visit. Pt state he is doing better just trying to get his strength back.    HPI [name] presents for follow-up. He  has a past medical history of Sickle cell anemia ([place]).   Patient was admitted for sickle cell pain crisis and managed appropriately with IVF, IV Dilaudid via PCA and IV Toradol, as well as other adjunct therapies per sickle cell pain management protocols.  Patient's pain slowly improved with above regimen.  Hemoglobin dropped to a nadir of 6.9 for which she was transfused with 1 unit of packed red blood cell.  His chest x-ray on 05/01/2021 showed retrocardiac disease versus atelectasis, patient had no other symptoms except for chest tightness.  He was given antibiotics IV and transitioned to oral Augmentin for 7 days.  As at today, patient claims he is doing really well he is just trying to get his strength back.  He continues to have tightness in the chest. He feels like this is due to the pneumonia. He reports that does undergo a lot of fasting. There are reasons why he does this. Denies fever, headache, cough, wheezing, abdominal pain, back pain, hip pain, or leg pain. Denies any open wounds, skin irritation. He is just taking things easy and enjoying the weather.    Past Medical History:  Diagnosis Date   Sickle cell anemia (H[facility])     Past Surgical History:  Procedure Laterality Date   CHOLECYSTECTOMY      Family History  Problem Relation Age of Onset   Sickle cell anemia Mother    Sickle cell trait Father    Sickle cell trait Sister    Sickle cell trait Brother    Cancer Maternal Grandmother     Social  History   Socioeconomic History   Marital status: Single    Spouse name: Not on file   Number of children: Not on file   Years of education: Not on file   Highest education level: Not on file  Occupational History   Not on file  Tobacco Use   Smoking status: Never   Smokeless tobacco: Never  Vaping Use   Vaping Use: Never used  Substance and Sexual Activity   Alcohol use: No   Drug use: No   Sexual activity: Not Currently  Other Topics Concern   Not on file  Social History Narrative   Not on file   Social Determinants of Health   Financial Resource Strain: Not on file  Food Insecurity: Not on file  Transportation Needs: Not on file  Physical Activity: Not on file  Stress: Not on file  Social Connections: Not on file  Intimate Partner Violence: Not on file    Outpatient Medications Prior to Visit  Medication Sig Dispense Refill   amoxicillin-clavulanate (AUGMENTIN) 875-125 MG tablet Take 1 tablet by mouth every 12 (twelve) hours for 7 days. 14 tablet 0   cholecalciferol (VITAMIN D3) 25 MCG (1000 UNIT) tablet Take 1,000 Units by mouth daily.     hydroxyurea (HYDREA) 500 MG capsule May take with food to minimize GI side effects. (Patient  taking differently: Take 500 mg by mouth 2 (two) times daily. May take with food to minimize GI side effects.) 360 capsule 1   oxyCODONE-acetaminophen (PERCOCET) 10-325 MG tablet Take 1 tablet by mouth every 4 (four) hours as needed for up to 15 days for pain. 90 tablet 0   folic acid (FOLVITE) 341 MCG tablet Take 400 mcg by mouth daily. (Patient not taking: Reported on 05/05/2021)     No facility-administered medications prior to visit.    No Known Allergies  ROS Review of Systems    Objective:    Physical Exam Constitutional:      General: He is not in acute distress.    Appearance: He is not ill-appearing, toxic-appearing or diaphoretic.  HENT:     Head: Normocephalic and atraumatic.     Nose: Nose normal.     Mouth/Throat:      Mouth: Mucous membranes are moist.  Eyes:     General: Scleral icterus present.  Cardiovascular:     Rate and Rhythm: Normal rate. Rhythm irregular.     Pulses: Normal pulses.     Heart sounds: Normal heart sounds.  Pulmonary:     Effort: Pulmonary effort is normal.     Breath sounds: Normal breath sounds.  Abdominal:     General: Abdomen is flat. Bowel sounds are normal.     Palpations: Abdomen is soft.  Musculoskeletal:        General: Normal range of motion.     Cervical back: Normal range of motion.  Skin:    General: Skin is warm and dry.     Capillary Refill: Capillary refill takes less than 2 seconds.  Neurological:     General: No focal deficit present.     Mental Status: He is alert and oriented to person, place, and time.  Psychiatric:        Mood and Affect: Mood normal.        Behavior: Behavior normal.        Thought Content: Thought content normal.        Judgment: Judgment normal.    BP 101/62   Pulse 69   Temp 97.8 F (36.6 C)   Ht 6' 1" (1.854 m)   Wt 142 lb 9.6 oz (64.7 kg)   SpO2 92%   BMI 18.81 kg/m  Wt Readings from Last 3 Encounters:  05/05/21 142 lb 9.6 oz (64.7 kg)  04/27/21 149 lb 14.6 oz (68 kg)  02/06/21 137 lb 9.1 oz (62.4 kg)     There are no preventive care reminders to display for this patient.   There are no preventive care reminders to display for this patient.  Lab Results  Component Value Date   TSH 1.122 09/05/2010   Lab Results  Component Value Date   WBC 7.8 05/02/2021   HGB 8.2 (L) 05/02/2021   HCT 23.3 (L) 05/02/2021   MCV 84.7 05/02/2021   PLT 271 05/02/2021   Lab Results  Component Value Date   NA 140 05/02/2021   K 4.1 05/02/2021   CO2 29 05/02/2021   GLUCOSE 124 (H) 05/02/2021   BUN 9 05/02/2021   CREATININE 0.74 05/02/2021   BILITOT 3.3 (H) 04/27/2021   ALKPHOS 68 04/27/2021   AST 29 04/27/2021   ALT 20 04/27/2021   PROT 7.9 04/27/2021   ALBUMIN 4.4 04/27/2021   CALCIUM 9.1 05/02/2021    ANIONGAP 13 05/02/2021   EGFR 117 02/01/2021   No results found for: CHOL No  results found for: HDL No results found for: LDLCALC No results found for: TRIG No results found for: CHOLHDL No results found for: HGBA1C    Assessment & Plan:   Problem List Items Addressed This Visit       Other   Chronic pain syndrome   Hb-SS disease without crisis ([place]) - Primary Ensure adequate hydration. Move frequently to reduce venous thromboembolism risk. Avoid situations that could lead to dehydration or could exacerbate pain Discussed S&S of infection, seizures, stroke acute chest, DVT and how important it is to seek medical attention Take medication as directed along with pain contract and overall compliance Discussed the risk related to opiate use (addition, tolerance and dependency)    Relevant Orders   POCT URINALYSIS DIP (CLINITEK) (Completed)   Sickle Cell Panel   Other Visit Diagnoses     Chronic prescription opiate use           No orders of the defined types were placed in this encounter.   Follow-up: Return in about 3 months (around 08/04/2021) for Follow up SCD [address] and labs only in 4 weeks orders in thanks, .    Vevelyn Francois, NP

## 2021-05-05 NOTE — Patient Instructions (Signed)
Sickle Cell Anemia, Adult Sickle cell anemia is a condition where your red blood cells are shaped like sickles. Red blood cells carry oxygen through the body. Sickle-shaped cells do not live as long as normal red blood cells. They also clump together and block blood from flowing through the blood vessels. This prevents the body from getting enough oxygen. Sickle cell anemia causes organ damage and pain. It also increases the risk of infection. Follow these instructions at home: Medicines Take over-the-counter and prescription medicines only as told by your doctor. If you were prescribed an antibiotic medicine, take it as told by your doctor. Do not stop taking the antibiotic even if you start to feel better. If you develop a fever, do not take medicines to lower the fever right away. Tell your doctor about the fever. Managing pain, stiffness, and swelling Try these methods to help with pain: Use a heating pad. Take a warm bath. Distract yourself, such as by watching TV. Eating and drinking Drink enough fluid to keep your pee (urine) clear or pale yellow. Drink more in hot weather and during exercise. Limit or avoid alcohol. Eat a healthy diet. Eat plenty of fruits, vegetables, whole grains, and lean protein. Take vitamins and supplements as told by your doctor. Traveling When traveling, keep these with you: Your medical information. The names of your doctors. Your medicines. If you need to take an airplane, talk to your doctor first. Activity Rest often. Avoid exercises that make your heart beat much faster, such as jogging. General instructions Do not use products that have nicotine or tobacco, such as cigarettes and e-cigarettes. If you need help quitting, ask your doctor. Consider wearing a medical alert bracelet. Avoid being in high places (high altitudes), such as mountains. Avoid very hot or cold temperatures. Avoid places where the temperature changes a lot. Keep all follow-up  visits as told by your doctor. This is important. Contact a doctor if: A joint hurts. Your feet or hands hurt or swell. You feel tired (fatigued). Get help right away if: You have symptoms of infection. These include: Fever. Chills. Being very tired. Irritability. Poor eating. Throwing up (vomiting). You feel dizzy or faint. You have new stomach pain, especially on the left side. You have a an erection (priapism) that lasts more than 4 hours. You have numbness in your arms or legs. You have a hard time moving your arms or legs. You have trouble talking. You have pain that does not go away when you take medicine. You are short of breath. You are breathing fast. You have a long-term cough. You have pain in your chest. You have a bad headache. You have a stiff neck. Your stomach looks bloated even though you did not eat much. Your skin is pale. You suddenly cannot see well. Summary Sickle cell anemia is a condition where your red blood cells are shaped like sickles. Follow your doctor's advice on ways to manage pain, food to eat, activities to do, and steps to take for safe travel. Get medical help right away if you have any signs of infection, such as a fever. This information is not intended to replace advice given to you by your health care provider. Make sure you discuss any questions you have with your health care provider. Document Revised: 12/26/2019 Document Reviewed: 12/26/2019 Elsevier Patient Education  [place].

## 2021-05-11 ENCOUNTER — Ambulatory Visit (INDEPENDENT_AMBULATORY_CARE_PROVIDER_SITE_OTHER): Payer: Medicare Other

## 2021-05-11 DIAGNOSIS — Z0000 Encounter for general adult medical examination without abnormal findings: Secondary | ICD-10-CM | POA: Diagnosis not present

## 2021-05-11 NOTE — Patient Instructions (Signed)
Health Maintenance, Male Adopting a healthy lifestyle and getting preventive care are important in promoting health and wellness. Ask your health care provider about: The right schedule for you to have regular tests and exams. Things you can do on your own to prevent diseases and keep yourself healthy. What should I know about diet, weight, and exercise? Eat a healthy diet  Eat a diet that includes plenty of vegetables, fruits, low-fat dairy products, and lean protein. Do not eat a lot of foods that are high in solid fats, added sugars, or sodium. Maintain a healthy weight Body mass index (BMI) is a measurement that can be used to identify possible weight problems. It estimates body fat based on height and weight. Your health care provider can help determine your BMI and help you achieve or maintain a healthy weight. Get regular exercise Get regular exercise. This is one of the most important things you can do for your health. Most adults should: Exercise for at least 150 minutes each week. The exercise should increase your heart rate and make you sweat (moderate-intensity exercise). Do strengthening exercises at least twice a week. This is in addition to the moderate-intensity exercise. Spend less time sitting. Even light physical activity can be beneficial. Watch cholesterol and blood lipids Have your blood tested for lipids and cholesterol at 38 years of age, then have this test every 5 years. You may need to have your cholesterol levels checked more often if: Your lipid or cholesterol levels are high. You are older than 38 years of age. You are at high risk for heart disease. What should I know about cancer screening? Many types of cancers can be detected early and may often be prevented. Depending on your health history and family history, you may need to have cancer screening at various ages. This may include screening for: Colorectal cancer. Prostate cancer. Skin cancer. Lung  cancer. What should I know about heart disease, diabetes, and high blood pressure? Blood pressure and heart disease High blood pressure causes heart disease and increases the risk of stroke. This is more likely to develop in people who have high blood pressure readings, are of African descent, or are overweight. Talk with your health care provider about your target blood pressure readings. Have your blood pressure checked: Every 3-5 years if you are 18-39 years of age. Every year if you are 40 years old or older. If you are between the ages of 65 and 75 and are a current or former smoker, ask your health care provider if you should have a one-time screening for abdominal aortic aneurysm (AAA). Diabetes Have regular diabetes screenings. This checks your fasting blood sugar level. Have the screening done: Once every three years after age 45 if you are at a normal weight and have a low risk for diabetes. More often and at a younger age if you are overweight or have a high risk for diabetes. What should I know about preventing infection? Hepatitis B If you have a higher risk for hepatitis B, you should be screened for this virus. Talk with your health care provider to find out if you are at risk for hepatitis B infection. Hepatitis C Blood testing is recommended for: Everyone born from 1945 through 1965. Anyone with known risk factors for hepatitis C. Sexually transmitted infections (STIs) You should be screened each year for STIs, including gonorrhea and chlamydia, if: You are sexually active and are younger than 38 years of age. You are older than 38 years   of age and your health care provider tells you that you are at risk for this type of infection. Your sexual activity has changed since you were last screened, and you are at increased risk for chlamydia or gonorrhea. Ask your health care provider if you are at risk. Ask your health care provider about whether you are at high risk for HIV.  Your health care provider may recommend a prescription medicine to help prevent HIV infection. If you choose to take medicine to prevent HIV, you should first get tested for HIV. You should then be tested every 3 months for as long as you are taking the medicine. Follow these instructions at home: Lifestyle Do not use any products that contain nicotine or tobacco, such as cigarettes, e-cigarettes, and chewing tobacco. If you need help quitting, ask your health care provider. Do not use street drugs. Do not share needles. Ask your health care provider for help if you need support or information about quitting drugs. Alcohol use Do not drink alcohol if your health care provider tells you not to drink. If you drink alcohol: Limit how much you have to 0-2 drinks a day. Be aware of how much alcohol is in your drink. In the [place] one drink equals one 12 oz bottle of beer (355 mL), one 5 oz glass of wine (148 mL), or one 1 oz glass of hard liquor (44 mL). General instructions Schedule regular health, dental, and eye exams. Stay current with your vaccines. Tell your health care provider if: You often feel depressed. You have ever been abused or do not feel safe at home. Summary Adopting a healthy lifestyle and getting preventive care are important in promoting health and wellness. Follow your health care provider's instructions about healthy diet, exercising, and getting tested or screened for diseases. Follow your health care provider's instructions on monitoring your cholesterol and blood pressure. This information is not intended to replace advice given to you by your health care provider. Make sure you discuss any questions you have with your health care provider. Document Revised: 10/09/2020 Document Reviewed: 07/25/2018 Elsevier Patient Education  2022 Elsevier Inc.  

## 2021-05-11 NOTE — Progress Notes (Signed)
Subjective:   [name] is a 40 y.o. male who presents for an Initial Medicare Annual Wellness Visit.  I connected with  [name] on 05/11/21 by audio enabled telemedicine application and verified that I am speaking with the correct person using two identifiers.   I discussed the limitations of evaluation and management by telemedicine. The patient expressed understanding and agreed to proceed.  Location of Patient: Home Location of Provider: Office   List any persons and their roles That are participating in the visit with the patient. Lavell Luster, LPN    Review of Systems    Defer to PCP       Objective:    There were no vitals filed for this visit. There is no height or weight on file to calculate BMI.  Advanced Directives 04/27/2021 02/02/2021 02/01/2021 12/23/2020 12/23/2020 07/17/2020 04/19/2017  Does Patient Have a Medical Advance Directive? No No No No No No No  Would patient like information on creating a medical advance directive? No - Patient declined No - Patient declined - No - Patient declined - - No - Patient declined  Pre-existing out of facility DNR order (yellow form or pink MOST form) - - - - - - -  Some encounter information is confidential and restricted. Go to Review Flowsheets activity to see all data.    Current Medications (verified) Outpatient Encounter Medications as of 05/11/2021  Medication Sig   cholecalciferol (VITAMIN D3) 25 MCG (1000 UNIT) tablet Take 1,000 Units by mouth daily.   folic acid (FOLVITE) 539 MCG tablet Take 400 mcg by mouth daily. (Patient not taking: Reported on 05/05/2021)   hydroxyurea (HYDREA) 500 MG capsule May take with food to minimize GI side effects. (Patient taking differently: Take 500 mg by mouth 2 (two) times daily. May take with food to minimize GI side effects.)   oxyCODONE-acetaminophen (PERCOCET) 10-325 MG tablet Take 1 tablet by mouth every 4 (four) hours as needed for up to 15 days for pain.   No  facility-administered encounter medications on file as of 05/11/2021.    Allergies (verified) Patient has no known allergies.   History: Past Medical History:  Diagnosis Date   Sickle cell anemia (HCC)    Past Surgical History:  Procedure Laterality Date   CHOLECYSTECTOMY     Family History  Problem Relation Age of Onset   Sickle cell anemia Mother    Sickle cell trait Father    Sickle cell trait Sister    Sickle cell trait Brother    Cancer Maternal Grandmother    Social History   Socioeconomic History   Marital status: Single    Spouse name: Not on file   Number of children: Not on file   Years of education: Not on file   Highest education level: Not on file  Occupational History   Not on file  Tobacco Use   Smoking status: Never   Smokeless tobacco: Never  Vaping Use   Vaping Use: Never used  Substance and Sexual Activity   Alcohol use: No   Drug use: No   Sexual activity: Not Currently  Other Topics Concern   Not on file  Social History Narrative   Not on file   Social Determinants of Health   Financial Resource Strain: Not on file  Food Insecurity: Not on file  Transportation Needs: Not on file  Physical Activity: Not on file  Stress: Not on file  Social Connections: Not on file    Tobacco Counseling  Counseling given: Not Answered   Clinical Intake:                 Diabetic?No         Activities of Daily Living In your present state of health, do you have any difficulty performing the following activities: 04/27/2021 04/27/2021  Hearing? - N  Vision? - N  Difficulty concentrating or making decisions? - N  Walking or climbing stairs? - N  Dressing or bathing? - N  Doing errands, shopping? N N  Some recent data might be hidden    Patient Care Team: Vevelyn Francois, NP as PCP - General (Adult Health Nurse Practitioner)  Indicate any recent Medical Services you may have received from other than [facility] providers in the past year  (date may be approximate).     Assessment:   This is a routine wellness examination for [name].  Hearing/Vision screen No results found.  Dietary issues and exercise activities discussed:     Goals Addressed   None   Depression Screen PHQ 2/9 Scores 05/05/2021 07/17/2020 10/23/2019 07/26/2019 09/14/2018 08/31/2018 04/25/2018  PHQ - 2 Score 1 0 0 0 0 0 1  PHQ- 9 Score 4 - - - - - -    Fall Risk Fall Risk  05/05/2021 07/17/2020 10/23/2019 07/26/2019 09/14/2018  Falls in the past year? 0 1 0 0 0  Number falls in past yr: 0 1 - 0 -  Injury with Fall? 0 1 - 0 -    FALL RISK PREVENTION PERTAINING TO THE HOME:  Any stairs in or around the home? No  If so, are there any without handrails? No  Home free of loose throw rugs in walkways, pet beds, electrical cords, etc? Yes  Adequate lighting in your home to reduce risk of falls? Yes   ASSISTIVE DEVICES UTILIZED TO PREVENT FALLS:  Life alert? No  Use of a cane, walker or w/c? No  Grab bars in the bathroom? No  Shower chair or bench in shower? No  Elevated toilet seat or a handicapped toilet? No   TIMED UP AND GO:  Was the test performed?  N/A .  Length of time to ambulate 10 feet: N/A sec.     Cognitive Function:        Immunizations Immunization History  Administered Date(s) Administered   Influenza,inj,Quad PF,6+ Mos 07/03/2014   Pneumococcal Conjugate-13 01/01/2015   Tdap 08/26/2013    TDAP status: Up to date  Flu Vaccine status: Due, Education has been provided regarding the importance of this vaccine. Advised may receive this vaccine at local pharmacy or Health Dept. Aware to provide a copy of the vaccination record if obtained from local pharmacy or Health Dept. Verbalized acceptance and understanding.  Pneumococcal vaccine status: Up to date  Covid-19 vaccine status: Information provided on how to obtain vaccines.   Qualifies for Shingles Vaccine? No   Zostavax completed No   Shingrix Completed?: No.     Education has been provided regarding the importance of this vaccine. Patient has been advised to call insurance company to determine out of pocket expense if they have not yet received this vaccine. Advised may also receive vaccine at local pharmacy or Health Dept. Verbalized acceptance and understanding.  Screening Tests Health Maintenance  Topic Date Due   COVID-19 Vaccine (1) 05/21/2021 (Originally 02/22/1981)   TETANUS/TDAP  08/27/2023   Hepatitis C Screening  Completed   HIV Screening  Completed   HPV VACCINES  Aged Out  Health Maintenance  There are no preventive care reminders to display for this patient.    Lung Cancer Screening: (Low Dose CT Chest recommended if Age 60-80 years, 30 pack-year currently smoking OR have quit w/in 15years.) does not qualify.   Lung Cancer Screening Referral: N/A  Additional Screening:  Hepatitis C Screening: does qualify; Completed 02/09/2016  Vision Screening: Recommended annual ophthalmology exams for early detection of glaucoma and other disorders of the eye. Is the patient up to date with their annual eye exam?  No  Who is the provider or what is the name of the office in which the patient attends annual eye exams? No If pt is not established with a provider, would they like to be referred to a provider to establish care? No .   Dental Screening: Recommended annual dental exams for proper oral hygiene  Community Resource Referral / Chronic Care Management: CRR required this visit?  No   CCM required this visit?  No      Plan:     I have personally reviewed and noted the following in the patient's chart:   Medical and social history Use of alcohol, tobacco or illicit drugs  Current medications and supplements including opioid prescriptions. Patient is currently taking opioid prescriptions. Information provided to patient regarding non-opioid alternatives. Patient advised to discuss non-opioid treatment plan with their  provider. Functional ability and status Nutritional status Physical activity Advanced directives List of other physicians Hospitalizations, surgeries, and ER visits in previous 12 months Vitals Screenings to include cognitive, depression, and falls Referrals and appointments  In addition, I have reviewed and discussed with patient certain preventive protocols, quality metrics, and best practice recommendations. A written personalized care plan for preventive services as well as general preventive health recommendations were provided to patient.     Lavell Luster, LPN   4/80/1655   Nurse Notes: Non Face to Face 45 min visit.   Mr. Alkhatib , Thank you for taking time to come for your Medicare Wellness Visit. I appreciate your ongoing commitment to your health goals. Please review the following plan we discussed and let me know if I can assist you in the future.   These are the goals we discussed:  Goals   None     This is a list of the screening recommended for you and due dates:  Health Maintenance  Topic Date Due   COVID-19 Vaccine (1) 05/21/2021*   Tetanus Vaccine  08/27/2023   Hepatitis C Screening: USPSTF Recommendation to screen - Ages 18-79 yo.  Completed   HIV Screening  Completed   HPV Vaccine  Aged Out  *Topic was postponed. The date shown is not the original due date.

## 2021-05-21 ENCOUNTER — Telehealth: Payer: Self-pay

## 2021-05-21 ENCOUNTER — Other Ambulatory Visit: Payer: Self-pay | Admitting: Nurse Practitioner

## 2021-05-21 DIAGNOSIS — D571 Sickle-cell disease without crisis: Secondary | ICD-10-CM

## 2021-05-21 DIAGNOSIS — Z79891 Long term (current) use of opiate analgesic: Secondary | ICD-10-CM

## 2021-05-21 MED ORDER — OXYCODONE-ACETAMINOPHEN 10-325 MG PO TABS: 1.0000 | ORAL_TABLET | ORAL | 0 refills | Status: DC | PRN

## 2021-05-21 NOTE — Telephone Encounter (Signed)
percocet

## 2021-05-25 NOTE — Telephone Encounter (Signed)
Left detailed vm that refills were sent to the patients pharmacy.

## 2021-06-17 ENCOUNTER — Other Ambulatory Visit: Payer: Self-pay | Admitting: Nurse Practitioner

## 2021-06-17 ENCOUNTER — Telehealth: Payer: Self-pay

## 2021-06-17 DIAGNOSIS — Z79891 Long term (current) use of opiate analgesic: Secondary | ICD-10-CM

## 2021-06-17 DIAGNOSIS — D571 Sickle-cell disease without crisis: Secondary | ICD-10-CM

## 2021-06-17 MED ORDER — OXYCODONE-ACETAMINOPHEN 10-325 MG PO TABS: 1.0000 | ORAL_TABLET | ORAL | 0 refills | Status: DC | PRN

## 2021-06-17 NOTE — Telephone Encounter (Signed)
Percocet 10 mg

## 2021-07-12 ENCOUNTER — Telehealth: Payer: Self-pay

## 2021-07-12 NOTE — Telephone Encounter (Signed)
percocet

## 2021-07-14 ENCOUNTER — Other Ambulatory Visit: Payer: Self-pay | Admitting: Nurse Practitioner

## 2021-07-14 DIAGNOSIS — Z79891 Long term (current) use of opiate analgesic: Secondary | ICD-10-CM

## 2021-07-14 DIAGNOSIS — D571 Sickle-cell disease without crisis: Secondary | ICD-10-CM

## 2021-07-14 MED ORDER — OXYCODONE-ACETAMINOPHEN 10-325 MG PO TABS: 1.0000 | ORAL_TABLET | ORAL | 0 refills | Status: DC | PRN

## 2021-08-04 ENCOUNTER — Telehealth: Payer: Self-pay

## 2021-08-04 ENCOUNTER — Other Ambulatory Visit: Payer: Self-pay | Admitting: Nurse Practitioner

## 2021-08-04 DIAGNOSIS — Z79891 Long term (current) use of opiate analgesic: Secondary | ICD-10-CM

## 2021-08-04 DIAGNOSIS — D571 Sickle-cell disease without crisis: Secondary | ICD-10-CM

## 2021-08-04 MED ORDER — OXYCODONE-ACETAMINOPHEN 10-325 MG PO TABS: 1.0000 | ORAL_TABLET | ORAL | 0 refills | Status: DC | PRN

## 2021-08-04 NOTE — Telephone Encounter (Signed)
Percocet

## 2021-08-17 DIAGNOSIS — R519 Headache, unspecified: Secondary | ICD-10-CM | POA: Diagnosis not present

## 2021-08-17 DIAGNOSIS — M79603 Pain in arm, unspecified: Secondary | ICD-10-CM | POA: Diagnosis not present

## 2021-08-17 DIAGNOSIS — Z79899 Other long term (current) drug therapy: Secondary | ICD-10-CM | POA: Diagnosis not present

## 2021-08-17 DIAGNOSIS — Z20822 Contact with and (suspected) exposure to covid-19: Secondary | ICD-10-CM | POA: Diagnosis not present

## 2021-08-17 DIAGNOSIS — R9431 Abnormal electrocardiogram [ECG] [EKG]: Secondary | ICD-10-CM | POA: Diagnosis not present

## 2021-08-17 DIAGNOSIS — D5700 Hb-SS disease with crisis, unspecified: Secondary | ICD-10-CM | POA: Diagnosis not present

## 2021-08-17 DIAGNOSIS — J3489 Other specified disorders of nose and nasal sinuses: Secondary | ICD-10-CM | POA: Diagnosis not present

## 2021-08-17 DIAGNOSIS — Z7964 Long term (current) use of myelosuppressive agent: Secondary | ICD-10-CM | POA: Diagnosis not present

## 2021-08-19 ENCOUNTER — Other Ambulatory Visit: Payer: Self-pay | Admitting: Nurse Practitioner

## 2021-08-19 ENCOUNTER — Telehealth: Payer: Self-pay

## 2021-08-19 DIAGNOSIS — D571 Sickle-cell disease without crisis: Secondary | ICD-10-CM

## 2021-08-19 DIAGNOSIS — Z79891 Long term (current) use of opiate analgesic: Secondary | ICD-10-CM

## 2021-08-19 MED ORDER — OXYCODONE-ACETAMINOPHEN 10-325 MG PO TABS: 1.0000 | ORAL_TABLET | ORAL | 0 refills | Status: DC | PRN

## 2021-08-19 NOTE — Telephone Encounter (Signed)
The refill has been sent. Thanks

## 2021-08-19 NOTE — Telephone Encounter (Signed)
Oxycodone  °

## 2021-09-08 ENCOUNTER — Telehealth: Payer: Self-pay

## 2021-09-08 NOTE — Telephone Encounter (Signed)
Percocet

## 2021-09-10 ENCOUNTER — Other Ambulatory Visit: Payer: Self-pay | Admitting: Nurse Practitioner

## 2021-09-10 DIAGNOSIS — Z79891 Long term (current) use of opiate analgesic: Secondary | ICD-10-CM

## 2021-09-10 DIAGNOSIS — D571 Sickle-cell disease without crisis: Secondary | ICD-10-CM

## 2021-09-10 MED ORDER — OXYCODONE-ACETAMINOPHEN 10-325 MG PO TABS: 1.0000 | ORAL_TABLET | ORAL | 0 refills | Status: DC | PRN

## 2021-09-10 NOTE — Telephone Encounter (Signed)
The refill has been sent. Thanks

## 2021-10-04 ENCOUNTER — Telehealth: Payer: Self-pay

## 2021-10-04 NOTE — Telephone Encounter (Signed)
Percocet

## 2021-10-06 ENCOUNTER — Other Ambulatory Visit: Payer: Self-pay | Admitting: Nurse Practitioner

## 2021-10-06 DIAGNOSIS — D571 Sickle-cell disease without crisis: Secondary | ICD-10-CM

## 2021-10-06 DIAGNOSIS — Z79891 Long term (current) use of opiate analgesic: Secondary | ICD-10-CM

## 2021-10-06 MED ORDER — OXYCODONE-ACETAMINOPHEN 10-325 MG PO TABS: 1.0000 | ORAL_TABLET | ORAL | 0 refills | Status: AC | PRN

## 2021-10-06 NOTE — Telephone Encounter (Signed)
The refill has been sent. Thanks

## 2021-10-28 ENCOUNTER — Encounter (HOSPITAL_COMMUNITY): Payer: Self-pay

## 2021-10-28 ENCOUNTER — Other Ambulatory Visit: Payer: Self-pay

## 2021-10-28 ENCOUNTER — Emergency Department (HOSPITAL_COMMUNITY): Payer: Medicare Other

## 2021-10-28 ENCOUNTER — Emergency Department (HOSPITAL_COMMUNITY)
Admission: EM | Admit: 2021-10-28 | Discharge: 2021-10-29 | Disposition: A | Discharge status: Home / Self Care | Payer: Medicare Other | Attending: Student | Admitting: Student

## 2021-10-28 DIAGNOSIS — D57219 Sickle-cell/Hb-C disease with crisis, unspecified: Secondary | ICD-10-CM | POA: Insufficient documentation

## 2021-10-28 DIAGNOSIS — I517 Cardiomegaly: Secondary | ICD-10-CM | POA: Diagnosis not present

## 2021-10-28 DIAGNOSIS — R079 Chest pain, unspecified: Secondary | ICD-10-CM | POA: Diagnosis not present

## 2021-10-28 DIAGNOSIS — D72829 Elevated white blood cell count, unspecified: Secondary | ICD-10-CM | POA: Diagnosis not present

## 2021-10-28 DIAGNOSIS — D649 Anemia, unspecified: Secondary | ICD-10-CM | POA: Diagnosis not present

## 2021-10-28 DIAGNOSIS — J9811 Atelectasis: Secondary | ICD-10-CM | POA: Diagnosis not present

## 2021-10-28 DIAGNOSIS — R0789 Other chest pain: Secondary | ICD-10-CM | POA: Diagnosis not present

## 2021-10-28 DIAGNOSIS — D5700 Hb-SS disease with crisis, unspecified: Secondary | ICD-10-CM | POA: Diagnosis not present

## 2021-10-28 DIAGNOSIS — M545 Low back pain, unspecified: Secondary | ICD-10-CM | POA: Diagnosis not present

## 2021-10-28 DIAGNOSIS — M549 Dorsalgia, unspecified: Secondary | ICD-10-CM | POA: Diagnosis not present

## 2021-10-28 LAB — CBC WITH DIFFERENTIAL/PLATELET
Abs Immature Granulocytes: 0.16 10*3/uL — ABNORMAL HIGH (ref 0.00–0.07)
Basophils Absolute: 0.1 10*3/uL (ref 0.0–0.1)
Basophils Relative: 0 %
Eosinophils Absolute: 0.3 10*3/uL (ref 0.0–0.5)
Eosinophils Relative: 1 %
HCT: 27.5 % — ABNORMAL LOW (ref 39.0–52.0)
Hemoglobin: 9.7 g/dL — ABNORMAL LOW (ref 13.0–17.0)
Immature Granulocytes: 1 %
Lymphocytes Relative: 21 %
Lymphs Abs: 3.9 10*3/uL (ref 0.7–4.0)
MCH: 31.1 pg (ref 26.0–34.0)
MCHC: 35.3 g/dL (ref 30.0–36.0)
MCV: 88.1 fL (ref 80.0–100.0)
Monocytes Absolute: 1 10*3/uL (ref 0.1–1.0)
Monocytes Relative: 5 %
Neutro Abs: 13.4 10*3/uL — ABNORMAL HIGH (ref 1.7–7.7)
Neutrophils Relative %: 72 %
Platelets: 384 10*3/uL (ref 150–400)
RBC: 3.12 MIL/uL — ABNORMAL LOW (ref 4.22–5.81)
RDW: 20.1 % — ABNORMAL HIGH (ref 11.5–15.5)
WBC: 18.9 10*3/uL — ABNORMAL HIGH (ref 4.0–10.5)
nRBC: 4.8 % — ABNORMAL HIGH (ref 0.0–0.2)

## 2021-10-28 LAB — COMPREHENSIVE METABOLIC PANEL WITH GFR
ALT: 29 U/L (ref 0–44)
AST: 47 U/L — ABNORMAL HIGH (ref 15–41)
Albumin: 4.6 g/dL (ref 3.5–5.0)
Alkaline Phosphatase: 80 U/L (ref 38–126)
Anion gap: 8 (ref 5–15)
BUN: 10 mg/dL (ref 6–20)
CO2: 24 mmol/L (ref 22–32)
Calcium: 9.2 mg/dL (ref 8.9–10.3)
Chloride: 104 mmol/L (ref 98–111)
Creatinine, Ser: 0.76 mg/dL (ref 0.61–1.24)
GFR, Estimated: >60 mL/min
Glucose, Bld: 94 mg/dL (ref 70–99)
Potassium: 4.1 mmol/L (ref 3.5–5.1)
Sodium: 136 mmol/L (ref 135–145)
Total Bilirubin: 2.7 mg/dL — ABNORMAL HIGH (ref 0.3–1.2)
Total Protein: 8.3 g/dL — ABNORMAL HIGH (ref 6.5–8.1)

## 2021-10-28 LAB — RETICULOCYTES
Immature Retic Fract: 27.9 % — ABNORMAL HIGH (ref 2.3–15.9)
RBC.: 3.09 MIL/uL — ABNORMAL LOW (ref 4.22–5.81)
Retic Count, Absolute: 302.5 10*3/uL — ABNORMAL HIGH (ref 19.0–186.0)
Retic Ct Pct: 9.2 % — ABNORMAL HIGH (ref 0.4–3.1)

## 2021-10-28 LAB — TROPONIN I (HIGH SENSITIVITY): Troponin I (High Sensitivity): 3 ng/L (ref <18)

## 2021-10-28 MED ORDER — IOHEXOL 350 MG/ML SOLN
100.0000 mL | Freq: Once | INTRAVENOUS | Status: AC | PRN
  Administered 2021-10-28: 100 mL via INTRAVENOUS

## 2021-10-28 MED ORDER — HYDROMORPHONE HCL 2 MG/ML IJ SOLN: 2.0000 mg | INTRAMUSCULAR | Status: DC

## 2021-10-28 MED ORDER — HYDROMORPHONE HCL 2 MG/ML IJ SOLN
2.0000 mg | INTRAMUSCULAR | Status: AC
  Administered 2021-10-28: 2 mg via INTRAVENOUS
  Filled 2021-10-28: qty 1

## 2021-10-28 MED ORDER — HYDROMORPHONE HCL 2 MG PO TABS
2.0000 mg | ORAL_TABLET | Freq: Once | ORAL | Status: DC
  Filled 2021-10-28: qty 1

## 2021-10-28 MED ORDER — HYDROMORPHONE HCL 1 MG/ML IJ SOLN
0.5000 mg | INTRAMUSCULAR | Status: AC
  Administered 2021-10-28: 0.5 mg via SUBCUTANEOUS
  Filled 2021-10-28: qty 1

## 2021-10-28 MED ORDER — SODIUM CHLORIDE 0.9 % IV SOLN
12.5000 mg | Freq: Once | INTRAVENOUS | Status: AC
  Administered 2021-10-28: 12.5 mg via INTRAVENOUS
  Filled 2021-10-28: qty 12.5

## 2021-10-28 MED ORDER — ONDANSETRON HCL 4 MG/2ML IJ SOLN: 4.0000 mg | INTRAMUSCULAR | Status: DC | PRN

## 2021-10-28 MED ORDER — HYDROMORPHONE HCL 1 MG/ML IJ SOLN: 0.5000 mg | INTRAMUSCULAR | Status: DC

## 2021-10-28 NOTE — ED Triage Notes (Signed)
Patient BIB EMS from home with c/o SCC. Pt endorses back pain 10/10. ? ?BP 118/96 ?HR 94 ?SPO2 94% RA ?

## 2021-10-28 NOTE — ED Provider Notes (Signed)
?[facility] DEPT ?Provider Note ? ? ?CSN: [id number] ?Arrival date & time: 10/28/21  2032 ? ?  ? ?History ? ?Chief Complaint  ?Patient presents with  ? Sickle Cell Pain Crisis  ? ? ?[name] is a 41 y.o. male. ? ? ?Sickle Cell Pain Crisis ? ?Patient is a 41 year old male with history of sickle cell anemia, remote history of acute chest, not on anticoagulation presenting today with sickle cell pain per patient.  The pain specifically is in his lower and upper extremities, started yesterday after change in weather.  He tried his home medicine without any relief, denies any nausea or vomiting but does endorse chest pain that he states does feel like his typical sickle cell anemia pain.  He does not have any nausea or vomiting, denies any shortness of breath.  The pain itself is not worse with inspiration, no recent travel or surgeries. ? ?Home Medications ?Prior to Admission medications   ?Medication Sig Start Date End Date Taking? Authorizing Provider  ?hydroxyurea (HYDREA) 500 MG capsule May take with food to minimize GI side effects. ?Patient taking differently: Take 500 mg by mouth 2 (two) times daily. May take with food to minimize GI side effects. 01/16/20  Yes Vevelyn Francois, NP  ?cholecalciferol (VITAMIN D3) 25 MCG (1000 UNIT) tablet Take 1,000 Units by mouth daily. ?Patient not taking: Reported on 10/29/2021    [provider]  ?folic acid (FOLVITE) 086 MCG tablet Take 400 mcg by mouth daily. ?Patient not taking: Reported on 10/29/2021    [provider]  ?oxyCODONE-acetaminophen (PERCOCET) 10-325 MG tablet Take 0.5-1 tablets by mouth every 6 (six) hours as needed for pain. 10/29/21   Vevelyn Francois, NP  ?   ? ?Allergies    ?Patient has no known allergies.   ? ?Review of Systems   ?Review of Systems ? ?Physical Exam ?Updated Vital Signs ?BP 125/72   Pulse 80   Temp 98 ?F (36.7 ?C) (Oral)   Resp 18   Ht '6\' 1"'$  (1.854 m)   Wt 70.3 kg   SpO2 99%   BMI 20.45  kg/m?  ?Physical Exam ?Vitals and nursing note reviewed. Exam conducted with a chaperone present.  ?Constitutional:   ?   Appearance: Normal appearance.  ?   Comments: Patient appears uncomfortable  ?HENT:  ?   Head: Normocephalic and atraumatic.  ?Eyes:  ?   General: No scleral icterus.    ?   Right eye: No discharge.     ?   Left eye: No discharge.  ?   Extraocular Movements: Extraocular movements intact.  ?   Pupils: Pupils are equal, round, and reactive to light.  ?Cardiovascular:  ?   Rate and Rhythm: Normal rate and regular rhythm.  ?   Pulses: Normal pulses.  ?   Heart sounds: Normal heart sounds. No murmur heard. ?  No friction rub. No gallop.  ?Pulmonary:  ?   Effort: Pulmonary effort is normal. No respiratory distress.  ?   Breath sounds: Normal breath sounds.  ?Abdominal:  ?   General: Abdomen is flat. Bowel sounds are normal. There is no distension.  ?   Palpations: Abdomen is soft.  ?   Tenderness: There is no abdominal tenderness.  ?Skin: ?   General: Skin is warm and dry.  ?   Coloration: Skin is not jaundiced.  ?Neurological:  ?   Mental Status: He is alert. Mental status is at baseline.  ?  Coordination: Coordination normal.  ? ? ?ED Results / Procedures / Treatments   ?Labs ?(all labs ordered are listed, but only abnormal results are displayed) ?Labs Reviewed  ?COMPREHENSIVE METABOLIC PANEL - Abnormal; Notable for the following components:  ?    Result Value  ? Total Protein 8.3 (*)   ? AST 47 (*)   ? Total Bilirubin 2.7 (*)   ? All other components within normal limits  ?CBC WITH DIFFERENTIAL/PLATELET - Abnormal; Notable for the following components:  ? WBC 18.9 (*)   ? RBC 3.12 (*)   ? Hemoglobin 9.7 (*)   ? HCT 27.5 (*)   ? RDW 20.1 (*)   ? nRBC 4.8 (*)   ? Neutro Abs 13.4 (*)   ? Abs Immature Granulocytes 0.16 (*)   ? All other components within normal limits  ?RETICULOCYTES - Abnormal; Notable for the following components:  ? Retic Ct Pct 9.2 (*)   ? RBC. 3.09 (*)   ? Retic Count, Absolute  302.5 (*)   ? Immature Retic Fract 27.9 (*)   ? All other components within normal limits  ?TROPONIN I (HIGH SENSITIVITY)  ?TROPONIN I (HIGH SENSITIVITY)  ? ? ?EKG ?EKG Interpretation ? ?Date/Time:  Thursday October 28 2021 21:40:11 EDT ?Ventricular Rate:  93 ?PR Interval:  162 ?QRS Duration: 100 ?QT Interval:  359 ?QTC Calculation: 447 ?R Axis:   73 ?Text Interpretation: Sinus rhythm Confirmed by Davonna Belling [phone]) on 10/29/2021 2:00:02 PM ? ?Radiology ?No results found. ? ?Procedures ?Procedures  ? ? ?Medications Ordered in ED ?Medications  ?HYDROmorphone (DILAUDID) injection 2 mg (2 mg Intravenous Given 10/28/21 2244)  ?HYDROmorphone (DILAUDID) injection 0.5 mg (0.5 mg Subcutaneous Given 10/28/21 2115)  ?diphenhydrAMINE (BENADRYL) 12.5 mg in sodium chloride 0.9 % 50 mL IVPB (0 mg Intravenous Stopped 10/28/21 2231)  ?HYDROmorphone (DILAUDID) injection 2 mg (2 mg Intravenous Given 10/28/21 2326)  ?iohexol (OMNIPAQUE) 350 MG/ML injection 100 mL (100 mLs Intravenous Contrast Given 10/28/21 2251)  ?HYDROmorphone (DILAUDID) injection 1 mg (1 mg Intravenous Given 10/29/21 0124)  ? ? ?ED Course/ Medical Decision Making/ A&P ?  ?                        ?Medical Decision Making ?Amount and/or Complexity of Data Reviewed ?Labs: ordered. ?Radiology: ordered. ? ?Risk ?Prescription drug management. ? ? ?This patient presents to the ED for concern of Myalgias and chest pain, this involves an extensive number of treatment options, and is a complaint that carries with it a high risk of complications and morbidity.  The differential diagnosis includes Sickle cell anemia with pain, ACS, pneumonia, PE, acute chest, other ? ?Patient?s presentation is complicated by their history of sickle cell anemia ? ? ?Additional history obtained:  ? ?Independent historian in room is patient's girlfriend. ? ?Reviewed previous laboratory work-up.  Additionally reviewed most recent, he is not seen frequently for pain crises ? ?  ?Lab Tests: ? ?I  ordered, viewed, and personally interpreted labs.  The pertinent results include:   ?Patient is making reticulocytes.  His hemoglobin is stable at 9.7 which actually improved from a month prior, he does have a leukocytosis of 18.9 which is increased from 1 month ago.  There is no AKI, no gross electrolyte derangement noted on CMP. ? ?Trop: pending ?  ?Imaging Studies ordered: ? ?I directly visualized DG chest which showed no evidence of PNA. CTA scan ordered and does not show any signs of PE.  ? ?  I agree with the radiologist interpretation ?  ? ?ECG/Cardiac monitoring:  ? ?Per my interpretation, EKG shows sinus rhythm  ? ?The patient was maintained on a cardiac monitor.  Visualized monitor strip which showed sinus rhythm with HR 85 per my interpretation.  ? ? ?Medicines ordered and prescription drug management: ? ?I ordered medication including: narcotics, benadryl, zofran   ? ?I have reviewed the patients home medicines and have made adjustments as needed ? ? ?Reevaluation: ? ?After the interventions noted above, I reevaluated the patient and found patient states his pain is slightly improved at the second dose of Dilaudid.  States the edges off and pain is coming down. ? ? ?Problems addressed / ED Course: ?41 year old male presenting due to chest pain and myalgias.  Of course, sickle cell pain exacerbation with weather changes high on the differential.  However, given he is having chest pain cannot rule out a PE.  He is not hypoxic, he does not have tachycardia or tachypnea.  Physical exam is unrevealing, he has a new leukocytosis of 18.9 which is higher when compared to previous on chart review.  X-ray did not show any signs of pneumonia, possible missed pneumonia.  CTA ordered and pending to better evaluate for possible PE ?   ? ?Disposition: ? ?After consideration of the diagnostic results and the patients response to treatment, I feel that the patent would benefit from D/C. ? ? Final Clinical Impression(s) /  ED Diagnoses ?Final diagnoses:  ?Chest pain, unspecified type  ?Sickle cell anemia with pain (HCC)  ? ? ?Rx / DC Orders ?ED Discharge Orders   ? ? None  ? ?  ? ? ?  ?Sherrill Raring, PA-C ?11/01/21 2536 ? ?  ?[place], [place]

## 2021-10-28 NOTE — ED Notes (Signed)
2L [place] O2 applied for comfort

## 2021-10-28 NOTE — Discharge Instructions (Addendum)
Continue taking your home medicine as needed.  Follow-up with sickle cell clinic tomorrow or the next week if pain is continuing.  Return to the ED if things change or worsen. ?

## 2021-10-29 ENCOUNTER — Other Ambulatory Visit: Payer: Self-pay | Admitting: Nurse Practitioner

## 2021-10-29 ENCOUNTER — Telehealth: Payer: Self-pay

## 2021-10-29 LAB — TROPONIN I (HIGH SENSITIVITY): Troponin I (High Sensitivity): 3 ng/L (ref <18)

## 2021-10-29 MED ORDER — HYDROMORPHONE HCL 1 MG/ML IJ SOLN
1.0000 mg | Freq: Once | INTRAMUSCULAR | Status: AC
  Administered 2021-10-29: 1 mg via INTRAVENOUS
  Filled 2021-10-29: qty 1

## 2021-10-29 MED ORDER — OXYCODONE-ACETAMINOPHEN 10-325 MG PO TABS: 0.5000 | ORAL_TABLET | Freq: Four times a day (QID) | ORAL | 0 refills | Status: DC | PRN

## 2021-10-29 NOTE — Telephone Encounter (Signed)
Percocet

## 2021-10-29 NOTE — ED Notes (Signed)
Patient verbalizes understanding of discharge instructions. Opportunity for questioning and answers were provided. Armband removed by staff, pt discharged from ED. Wheeled out to lobby  

## 2021-10-29 NOTE — ED Provider Notes (Signed)
Patient was received at shift change from [facility] please see her note for full detail ? ?In short patient with medical history including sickle cell disease presents emerged part with sickle cell pain crisis, states he is having pain in his chest back and arms, states this is stable for him, he is not endorse any shortness of breath, orthopnea, worsening peripheral edema, denies any pain in the joints stomach pain nausea vomiting.  States he been taking as his at home pain medication much relief. ? ?Per previous provider follow-up on patient after pain medication and determine disposition ? ?Lab work-CBC shows leukocytosis of 18.9, normocytic anemia hemoglobin 9.7 at baseline for patient, CMP shows slightly elevated AST 47 T. bili 2.7, reticular count at baseline, for troponin is 3, second opponent is 3 all lab work was personally reviewed and interpreted by myself. ? ?Imaging-chest x-ray as well as CT of chest both which negative for acute findings this was personally reviewed and interpreted by myself. ? ?EKG-which I have personally reviewed and interpreted without signs of ischemia. ? ?I have low suspicion for acute chest pain syndrome as he has no infiltrate seen on imaging, no new oxygen requirements.  I have low suspicion for ACS presentation atypical, EKG without signs of ischemia negative delta troponins.  Low suspicion for dissection and/or PE as CT of chest is negative for these findings.  A low system for septic joint is no erythematous or intracranial exam.  Low suspicion for hepatic splenic infarct as he has no abdominal tenderness.  Low suspicion for aplastic anemia as per to count is at his normal level. ? ?Patient is reassessed after pain medication he is resting comfortably he states his pain is at a manageable level and is agreeable for discharge. ?  ?Marcello Fennel, PA-C ?10/29/21 8182 ? ?  ?Palumbo, April, MD ?10/29/21 9937 ? ?

## 2021-10-29 NOTE — Telephone Encounter (Signed)
The refill has been sent. Thanks  ? ?

## 2021-11-02 ENCOUNTER — Telehealth: Payer: Self-pay

## 2021-11-04 ENCOUNTER — Other Ambulatory Visit: Payer: Self-pay

## 2021-11-08 ENCOUNTER — Ambulatory Visit (INDEPENDENT_AMBULATORY_CARE_PROVIDER_SITE_OTHER): Payer: Medicare Other | Admitting: Nurse Practitioner

## 2021-11-08 ENCOUNTER — Other Ambulatory Visit: Payer: Self-pay

## 2021-11-08 ENCOUNTER — Telehealth: Payer: Self-pay | Admitting: Nurse Practitioner

## 2021-11-08 VITALS — BP 120/77 | HR 84 | Temp 98.8°F | Ht 73.0 in | Wt 149.4 lb

## 2021-11-08 DIAGNOSIS — D571 Sickle-cell disease without crisis: Secondary | ICD-10-CM

## 2021-11-08 MED ORDER — OXYCODONE-ACETAMINOPHEN 10-325 MG PO TABS
0.5000 | ORAL_TABLET | Freq: Four times a day (QID) | ORAL | 0 refills | Status: DC | PRN
Start: 2021-11-08 — End: 2021-11-18

## 2021-11-08 NOTE — Telephone Encounter (Signed)
[facility] [address] ?

## 2021-11-08 NOTE — Patient Instructions (Signed)
You were seen today in the [facility] for reevaluation of sickle cell and medication refill. You were prescribed medications, please take as directed. Please follow up in 3 mths for sickle cell evaluation.  ? ?

## 2021-11-08 NOTE — Progress Notes (Signed)
? ?[place] [address], [place]  [address] ?Phone:  [phone]   Fax:  [phone] ?Subjective:  ? Patient ID: [name], male    DOB: July 25, 1981, 41 y.o.   MRN: [id number] ? ?Chief Complaint  ?Patient presents with  ? Follow-up  ?  Pt is concern with getting appointment at the day hospital. Pt is requesting refill oxycodone  ? ?HPI ?[name] 41 y.o. male  has a past medical history of Sickle cell anemia ([place]). To the [facility] for hospital follow up and medication refills. ? ?Patient states that he went to the ED on 10/28/21 for sickle cell crisis. Pain started in diffuse lower back and radiated to chest. Symptoms resolved after treatment with medication and IV fluids. Denies any other concerns today. Currently compliant with all medications. Denies any pain during today's visit.  ? ?Denies any fever. Denies any fatigue, chest pain, shortness of breath, HA or dizziness. Denies any blurred vision, numbness or tingling. ? ?Past Medical History:  ?Diagnosis Date  ? Sickle cell anemia (HCC)   ? ? ?Past Surgical History:  ?Procedure Laterality Date  ? CHOLECYSTECTOMY    ? ? ?Family History  ?Problem Relation Age of Onset  ? Sickle cell anemia Mother   ? Sickle cell trait Father   ? Sickle cell trait Sister   ? Sickle cell trait Brother   ? Cancer Maternal Grandmother   ? ? ?Social History  ? ?Socioeconomic History  ? Marital status: Single  ?  Spouse name: Not on file  ? Number of children: Not on file  ? Years of education: Not on file  ? Highest education level: Associate degree: occupational, Hotel manager, or vocational program  ?Occupational History  ? Not on file  ?Tobacco Use  ? Smoking status: Never  ? Smokeless tobacco: Never  ?Vaping Use  ? Vaping Use: Never used  ?Substance and Sexual Activity  ? Alcohol use: No  ? Drug use: No  ? Sexual activity: Not Currently  ?Other Topics Concern  ? Not on file  ?Social History Narrative  ? Pt lives at home with girlfriend.   ? ?Social Determinants of Health  ? ?Financial Resource Strain: Low Risk   ? Difficulty of Paying Living Expenses: Not very hard  ?Food Insecurity: No Food Insecurity  ? Worried About Charity fundraiser in the Last Year: Never true  ? Ran Out of Food in the Last Year: Never true  ?Transportation Needs: No Transportation Needs  ? Lack of Transportation (Medical): No  ? Lack of Transportation (Non-Medical): No  ?Physical Activity: Insufficiently Active  ? Days of Exercise per Week: 4 days  ? Minutes of Exercise per Session: 20 min  ?Stress: No Stress Concern Present  ? Feeling of Stress : Only a little  ?Social Connections: Socially Integrated  ? Frequency of Communication with Friends and Family: More than three times a week  ? Frequency of Social Gatherings with Friends and Family: More than three times a week  ? Attends Religious Services: More than 4 times per year  ? Active Member of Clubs or Organizations: Yes  ? Attends Archivist Meetings: More than 4 times per year  ? Marital Status: Living with partner  ?Intimate Partner Violence: Not At Risk  ? Fear of Current or Ex-Partner: No  ? Emotionally Abused: No  ? Physically Abused: No  ? Sexually Abused: No  ? ? ?Outpatient Medications Prior to Visit  ?Medication Sig  Dispense Refill  ? folic acid (FOLVITE) 202 MCG tablet Take 400 mcg by mouth daily.    ? hydroxyurea (HYDREA) 500 MG capsule May take with food to minimize GI side effects. (Patient taking differently: Take 500 mg by mouth 2 (two) times daily. May take with food to minimize GI side effects.) 360 capsule 1  ? oxyCODONE-acetaminophen (PERCOCET) 10-325 MG tablet Take 0.5-1 tablets by mouth every 6 (six) hours as needed for pain. 30 tablet 0  ? cholecalciferol (VITAMIN D3) 25 MCG (1000 UNIT) tablet Take 1,000 Units by mouth daily. (Patient not taking: Reported on 11/08/2021)    ? ?No facility-administered medications prior to visit.  ? ? ?No Known Allergies ? ?Review of Systems   ?Constitutional:  Negative for chills, fever and malaise/fatigue.  ?HENT: Negative.    ?Eyes: Negative.   ?Respiratory:  Negative for cough and shortness of breath.   ?Cardiovascular:  Negative for chest pain, palpitations and leg swelling.  ?Gastrointestinal:  Negative for abdominal pain, blood in stool, constipation, diarrhea, nausea and vomiting.  ?Genitourinary: Negative.   ?Musculoskeletal:  Positive for back pain. Negative for falls, joint pain, myalgias and neck pain.  ?Skin: Negative.   ?Neurological: Negative.   ?Psychiatric/Behavioral:  Negative for depression. The patient is not nervous/anxious.   ?All other systems reviewed and are negative. ? ?   ?Objective:  ?  ?Physical Exam ?Vitals reviewed.  ?Constitutional:   ?   General: He is not in acute distress. ?   Appearance: Normal appearance. He is normal weight.  ?HENT:  ?   Head: Normocephalic.  ?Cardiovascular:  ?   Rate and Rhythm: Normal rate and regular rhythm.  ?   Pulses: Normal pulses.  ?   Heart sounds: Normal heart sounds.  ?   Comments: No obvious peripheral edema ?Pulmonary:  ?   Effort: Pulmonary effort is normal.  ?   Breath sounds: Normal breath sounds.  ?Musculoskeletal:     ?   General: No swelling, tenderness, deformity or signs of injury. Normal range of motion.  ?   Right lower leg: No edema.  ?   Left lower leg: No edema.  ?Skin: ?   General: Skin is warm and dry.  ?   Capillary Refill: Capillary refill takes less than 2 seconds.  ?Neurological:  ?   General: No focal deficit present.  ?   Mental Status: He is alert and oriented to person, place, and time.  ?Psychiatric:     ?   Mood and Affect: Mood normal.     ?   Behavior: Behavior normal.     ?   Thought Content: Thought content normal.     ?   Judgment: Judgment normal.  ? ? ?BP 120/77 (BP Location: Right Arm, Patient Position: Sitting, Cuff Size: Normal)   Pulse 84   Temp 98.8 ?F (37.1 ?C)   Ht 6' 1"  (1.854 m)   Wt 149 lb 6 oz (67.8 kg)   SpO2 98%   BMI 19.71 kg/m?  ?Wt  Readings from Last 3 Encounters:  ?11/08/21 149 lb 6 oz (67.8 kg)  ?10/28/21 155 lb (70.3 kg)  ?05/05/21 142 lb 9.6 oz (64.7 kg)  ? ? ?Immunization History  ?Administered Date(s) Administered  ? Influenza,inj,Quad PF,6+ Mos 07/03/2014  ? Pneumococcal Conjugate-13 01/01/2015  ? Tdap 08/26/2013  ? ? ?Diabetic Foot Exam - Simple   ?No data filed ?  ? ? ?Lab Results  ?Component Value Date  ? TSH 1.122 09/05/2010  ? ?  Lab Results  ?Component Value Date  ? WBC 18.9 (H) 10/28/2021  ? HGB 9.7 (L) 10/28/2021  ? HCT 27.5 (L) 10/28/2021  ? MCV 88.1 10/28/2021  ? PLT 384 10/28/2021  ? ?Lab Results  ?Component Value Date  ? NA 136 10/28/2021  ? K 4.1 10/28/2021  ? CO2 24 10/28/2021  ? GLUCOSE 94 10/28/2021  ? BUN 10 10/28/2021  ? CREATININE 0.76 10/28/2021  ? BILITOT 2.7 (H) 10/28/2021  ? ALKPHOS 80 10/28/2021  ? AST 47 (H) 10/28/2021  ? ALT 29 10/28/2021  ? PROT 8.3 (H) 10/28/2021  ? ALBUMIN 4.6 10/28/2021  ? CALCIUM 9.2 10/28/2021  ? ANIONGAP 8 10/28/2021  ? EGFR 117 02/01/2021  ? ?No results found for: CHOL ?No results found for: HDL ?No results found for: [place] ?No results found for: TRIG ?No results found for: CHOLHDL ?No results found for: HGBA1C ? ?   ?Assessment & Plan:  ? ?Problem List Items Addressed This Visit   ? ?  ? Other  ? Hb-SS disease without crisis [facility]) - Primary  ? Relevant Medications  ? oxyCODONE-acetaminophen (PERCOCET) 10-325 MG tablet medication refilled ?Discussed maintaining a balanced diet and appropriate hydration  ?Encouraged continued medications compliance  ? ?Follow up in 3 mths for reevaluation of chronic illness, sooner as needed   ? ? ?I am having [name] maintain his folic acid, hydroxyurea, cholecalciferol, and oxyCODONE-acetaminophen. ? ?Meds ordered this encounter  ?Medications  ? oxyCODONE-acetaminophen (PERCOCET) 10-325 MG tablet  ?  Sig: Take 0.5-1 tablets by mouth every 6 (six) hours as needed for pain.  ?  Dispense:  90 tablet  ?  Refill:  0  ? ? ? ?Teena Dunk, NP ?  ?

## 2021-11-09 ENCOUNTER — Encounter: Payer: Self-pay | Admitting: Nurse Practitioner

## 2021-11-17 ENCOUNTER — Telehealth: Payer: Self-pay | Admitting: Nurse Practitioner

## 2021-11-17 NOTE — Telephone Encounter (Signed)
Hydroxyurea refill request.

## 2021-11-18 ENCOUNTER — Other Ambulatory Visit: Payer: Self-pay | Admitting: Nurse Practitioner

## 2021-11-18 DIAGNOSIS — D571 Sickle-cell disease without crisis: Secondary | ICD-10-CM

## 2021-11-18 MED ORDER — OXYCODONE-ACETAMINOPHEN 10-325 MG PO TABS: 0.5000 | ORAL_TABLET | Freq: Four times a day (QID) | ORAL | 0 refills | Status: DC | PRN

## 2021-12-10 ENCOUNTER — Telehealth: Payer: Self-pay

## 2021-12-10 NOTE — Telephone Encounter (Signed)
Hydroxyurea ?Percocet ?

## 2021-12-15 ENCOUNTER — Telehealth: Payer: Self-pay | Admitting: Nurse Practitioner

## 2021-12-15 ENCOUNTER — Other Ambulatory Visit: Payer: Self-pay | Admitting: Nurse Practitioner

## 2021-12-15 DIAGNOSIS — D571 Sickle-cell disease without crisis: Secondary | ICD-10-CM

## 2021-12-15 MED ORDER — HYDROXYUREA 500 MG PO CAPS: ORAL_CAPSULE | ORAL | 1 refills | Status: DC

## 2021-12-15 MED ORDER — OXYCODONE-ACETAMINOPHEN 10-325 MG PO TABS
0.5000 | ORAL_TABLET | Freq: Four times a day (QID) | ORAL | 0 refills | Status: DC | PRN
Start: 2021-12-15 — End: 2022-01-17

## 2021-12-15 NOTE — Telephone Encounter (Signed)
Hydroxyurea and Percocet refill request  ?

## 2022-01-17 ENCOUNTER — Other Ambulatory Visit: Payer: Self-pay | Admitting: Family Medicine

## 2022-01-17 ENCOUNTER — Telehealth: Payer: Self-pay

## 2022-01-17 DIAGNOSIS — D571 Sickle-cell disease without crisis: Secondary | ICD-10-CM

## 2022-01-17 MED ORDER — OXYCODONE-ACETAMINOPHEN 10-325 MG PO TABS: 0.5000 | ORAL_TABLET | Freq: Four times a day (QID) | ORAL | 0 refills | Status: DC | PRN

## 2022-01-17 NOTE — Progress Notes (Signed)
Reviewed PDMP substance reporting system prior to prescribing opiate medications. No inconsistencies noted.    Meds ordered this encounter  Medications   oxyCODONE-acetaminophen (PERCOCET) 10-325 MG tablet    Sig: Take 0.5-1 tablets by mouth every 6 (six) hours as needed for pain.    Dispense:  90 tablet    Refill:  0    Order Specific Question:   Supervising Provider    Answer:   Tresa Garter [[id number]]     Donia Pounds  APRN, MSN, FNP-C [facility] [place] [address] [place], [place] [address] [phone]

## 2022-01-17 NOTE — Telephone Encounter (Signed)
Percocet

## 2022-01-18 ENCOUNTER — Telehealth: Payer: Self-pay | Admitting: Family Medicine

## 2022-01-18 ENCOUNTER — Telehealth: Payer: Self-pay | Admitting: Nurse Practitioner

## 2022-01-18 ENCOUNTER — Other Ambulatory Visit: Payer: Self-pay | Admitting: Family Medicine

## 2022-01-18 DIAGNOSIS — D571 Sickle-cell disease without crisis: Secondary | ICD-10-CM

## 2022-01-18 MED ORDER — OXYCODONE-ACETAMINOPHEN 10-325 MG PO TABS: 0.5000 | ORAL_TABLET | Freq: Four times a day (QID) | ORAL | 0 refills | Status: DC | PRN

## 2022-01-18 NOTE — Telephone Encounter (Signed)
Oxycodone sent to [facility]

## 2022-01-18 NOTE — Progress Notes (Signed)
Meds ordered this encounter  Medications   oxyCODONE-acetaminophen (PERCOCET) 10-325 MG tablet    Sig: Take 0.5-1 tablets by mouth every 6 (six) hours as needed for pain.    Dispense:  90 tablet    Refill:  0    Order Specific Question:   Supervising Provider    Answer:   Tresa Garter [[id number]]   Medication resent to [facility].   Donia Pounds  APRN, MSN, FNP-C [facility] [place] [address] [place], [place] [address] [phone]

## 2022-01-18 NOTE — Telephone Encounter (Signed)
[facility] so [facility] did not [facility]. He is requesting his Oxycodone prescription be sent to [facility] instead from now. Pt states he is going to [place] later today and is really hoping to be able to pick up the prescription before he leaves.

## 2022-02-07 ENCOUNTER — Telehealth: Payer: Self-pay | Admitting: Nurse Practitioner

## 2022-02-07 ENCOUNTER — Other Ambulatory Visit: Payer: Self-pay | Admitting: Family Medicine

## 2022-02-07 DIAGNOSIS — D571 Sickle-cell disease without crisis: Secondary | ICD-10-CM

## 2022-02-07 MED ORDER — OXYCODONE-ACETAMINOPHEN 10-325 MG PO TABS: 0.5000 | ORAL_TABLET | Freq: Four times a day (QID) | ORAL | 0 refills | Status: DC | PRN

## 2022-02-07 NOTE — Progress Notes (Signed)
Reviewed PDMP substance reporting system prior to prescribing opiate medications. No inconsistencies noted.    Meds ordered this encounter  Medications   oxyCODONE-acetaminophen (PERCOCET) 10-325 MG tablet    Sig: Take 0.5-1 tablets by mouth every 6 (six) hours as needed for pain.    Dispense:  90 tablet    Refill:  0    Order Specific Question:   Supervising Provider    Answer:   Tresa Garter [[id number]]     Donia Pounds  APRN, MSN, FNP-C [facility] [place] [address] [place], [place] [address] [phone]

## 2022-02-07 NOTE — Telephone Encounter (Signed)
Percocet refill request 

## 2022-02-09 ENCOUNTER — Ambulatory Visit (INDEPENDENT_AMBULATORY_CARE_PROVIDER_SITE_OTHER): Payer: Medicare Other | Admitting: Nurse Practitioner

## 2022-02-09 ENCOUNTER — Encounter: Payer: Self-pay | Admitting: Nurse Practitioner

## 2022-02-09 VITALS — BP 120/73 | HR 84 | Temp 98.4°F | Resp 18 | Ht 73.0 in | Wt 148.0 lb

## 2022-02-09 DIAGNOSIS — D571 Sickle-cell disease without crisis: Secondary | ICD-10-CM

## 2022-02-09 NOTE — Assessment & Plan Note (Signed)
-   Sickle Cell Panel - [id number] 11+Oxyco+Alc+Crt-Bund  -Discussed need for good hydration  -Monitor hydration status  -Avoid heat, cold, stress, and infectious triggers  -Discussed the importance of adherence to medication regimen  -Please seek medical attention immediately if any symptoms of bleeding, anemia, infection occurs    Follow up:  Follow up in 3 months or sooner if needed - may need EKG at next visit

## 2022-02-09 NOTE — Patient Instructions (Signed)
1. Hb-SS disease without crisis ([place])  - Sickle Cell Panel - [id number] 11+Oxyco+Alc+Crt-Bund  -Discussed need for good hydration  -Monitor hydration status  -Avoid heat, cold, stress, and infectious triggers  -Discussed the importance of adherence to medication regimen  -Please seek medical attention immediately if any symptoms of bleeding, anemia, infection occurs    Follow up:  Follow up in 3 months or sooner if needed - may need EKG at next visit

## 2022-02-09 NOTE — Progress Notes (Signed)
$'@Patient'G$  ID: [name], male    DOB: 1980/12/18, 41 y.o.   MRN: [id number]  Chief Complaint  Patient presents with   Follow-up    3 month FU. No questions no issues    Referring provider: Vevelyn Francois, NP   HPI  [name] 41 y.o. male  has a past medical history of Sickle cell anemia ([place]). To the [facility] for hospital follow up and medication refills.  Patient presents today for routine follow-up visit.  Patient states that overall he has been doing well and has no new issues or concerns today.  He is due for blood work today. Denies f/c/s, n/v/d, hemoptysis, PND, chest pain or edema.      No Known Allergies  Immunization History  Administered Date(s) Administered   Influenza,inj,Quad PF,6+ Mos 07/03/2014   Pneumococcal Conjugate-13 01/01/2015   Tdap 08/26/2013    Past Medical History:  Diagnosis Date   Cancer ([place])    Sickle cell anemia (HCC)     Tobacco History: Social History   Tobacco Use  Smoking Status Never  Smokeless Tobacco Never   Counseling given: Not Answered   Outpatient Encounter Medications as of 02/09/2022  Medication Sig   folic acid (FOLVITE) 947 MCG tablet Take 400 mcg by mouth daily.   hydroxyurea (HYDREA) 500 MG capsule May take with food to minimize GI side effects.   oxyCODONE-acetaminophen (PERCOCET) 10-325 MG tablet Take 0.5-1 tablets by mouth every 6 (six) hours as needed for pain.   cholecalciferol (VITAMIN D3) 25 MCG (1000 UNIT) tablet Take 1,000 Units by mouth daily. (Patient not taking: Reported on 11/08/2021)   No facility-administered encounter medications on file as of 02/09/2022.     Review of Systems  Review of Systems  Constitutional: Negative.   HENT: Negative.    Cardiovascular: Negative.   Gastrointestinal: Negative.   Allergic/Immunologic: Negative.   Neurological: Negative.   Psychiatric/Behavioral: Negative.         Physical Exam  BP 120/73 (BP Location: Left Arm, Patient Position: Sitting, Cuff  Size: Normal)   Pulse 84   Temp 98.4 F (36.9 C)   Resp 18   Ht '6\' 1"'$  (1.854 m)   Wt 148 lb (67.1 kg)   SpO2 100%   BMI 19.53 kg/m   Wt Readings from Last 5 Encounters:  02/09/22 148 lb (67.1 kg)  11/08/21 149 lb 6 oz (67.8 kg)  10/28/21 155 lb (70.3 kg)  05/05/21 142 lb 9.6 oz (64.7 kg)  04/27/21 149 lb 14.6 oz (68 kg)     Physical Exam Vitals and nursing note reviewed.  Constitutional:      General: He is not in acute distress.    Appearance: He is well-developed.  Cardiovascular:     Rate and Rhythm: Normal rate and regular rhythm.  Pulmonary:     Effort: Pulmonary effort is normal.     Breath sounds: Normal breath sounds.  Skin:    General: Skin is warm and dry.  Neurological:     Mental Status: He is alert and oriented to person, place, and time.      Lab Results:  CBC    Component Value Date/Time   WBC 18.9 (H) 10/28/2021 2138   RBC 3.12 (L) 10/28/2021 2138   RBC 3.09 (L) 10/28/2021 2138   HGB 9.7 (L) 10/28/2021 2138   HGB 9.0 (L) 02/01/2021 1505   HCT 27.5 (L) 10/28/2021 2138   HCT 25.8 (L) 02/01/2021 1505   PLT 384 10/28/2021 2138  PLT 341 02/01/2021 1505   MCV 88.1 10/28/2021 2138   MCV 88 02/01/2021 1505   MCH 31.1 10/28/2021 2138   MCHC 35.3 10/28/2021 2138   RDW 20.1 (H) 10/28/2021 2138   RDW 19.7 (H) 02/01/2021 1505   LYMPHSABS 3.9 10/28/2021 2138   LYMPHSABS 4.2 (H) 02/01/2021 1505   MONOABS 1.0 10/28/2021 2138   EOSABS 0.3 10/28/2021 2138   EOSABS 0.2 02/01/2021 1505   BASOSABS 0.1 10/28/2021 2138   BASOSABS 0.1 02/01/2021 1505    BMET    Component Value Date/Time   NA 136 10/28/2021 2138   NA 140 02/01/2021 1505   K 4.1 10/28/2021 2138   CL 104 10/28/2021 2138   CO2 24 10/28/2021 2138   GLUCOSE 94 10/28/2021 2138   BUN 10 10/28/2021 2138   BUN 8 02/01/2021 1505   CREATININE 0.76 10/28/2021 2138   CREATININE 0.76 03/08/2017 1047   CALCIUM 9.2 10/28/2021 2138   GFRNONAA >60 10/28/2021 2138   GFRNONAA >89 03/08/2017 1047    GFRAA 135 04/17/2020 1412   GFRAA >89 03/08/2017 1047    BNP No results found for: "BNP"  ProBNP No results found for: "PROBNP"  Imaging: No results found.   Assessment & Plan:   Hb-SS disease without crisis ([place]) - Sickle Cell Panel - [id number] 11+Oxyco+Alc+Crt-Bund  -Discussed need for good hydration  -Monitor hydration status  -Avoid heat, cold, stress, and infectious triggers  -Discussed the importance of adherence to medication regimen  -Please seek medical attention immediately if any symptoms of bleeding, anemia, infection occurs    Follow up:  Follow up in 3 months or sooner if needed - may need EKG at next visit     Fenton Foy, NP 02/09/2022

## 2022-02-10 ENCOUNTER — Other Ambulatory Visit: Payer: Self-pay | Admitting: Nurse Practitioner

## 2022-02-10 DIAGNOSIS — E559 Vitamin D deficiency, unspecified: Secondary | ICD-10-CM

## 2022-02-10 LAB — CMP14+CBC/D/PLT+FER+RETIC+V...
ALT: 38 IU/L (ref 0–44)
AST: 31 IU/L (ref 0–40)
Albumin/Globulin Ratio: 1.5 (ref 1.2–2.2)
Albumin: 4.9 g/dL (ref 4.0–5.0)
Alkaline Phosphatase: 117 IU/L (ref 44–121)
BUN/Creatinine Ratio: 9 (ref 9–20)
BUN: 7 mg/dL (ref 6–20)
Basophils Absolute: 0.1 10*3/uL (ref 0.0–0.2)
Basos: 1 %
Bilirubin Total: 1.7 mg/dL — ABNORMAL HIGH (ref 0.0–1.2)
CO2: 23 mmol/L (ref 20–29)
Calcium: 9.6 mg/dL (ref 8.7–10.2)
Chloride: 100 mmol/L (ref 96–106)
Creatinine, Ser: 0.79 mg/dL (ref 0.76–1.27)
EOS (ABSOLUTE): 0.2 10*3/uL (ref 0.0–0.4)
Eos: 2 %
Ferritin: 368 ng/mL (ref 30–400)
Globulin, Total: 3.3 g/dL (ref 1.5–4.5)
Glucose: 71 mg/dL (ref 70–99)
Hematocrit: 25.8 % — ABNORMAL LOW (ref 37.5–51.0)
Hemoglobin: 8.7 g/dL — ABNORMAL LOW (ref 13.0–17.7)
Immature Grans (Abs): 0 10*3/uL (ref 0.0–0.1)
Immature Granulocytes: 0 %
Lymphocytes Absolute: 2.7 10*3/uL (ref 0.7–3.1)
Lymphs: 30 %
MCH: 30 pg (ref 26.6–33.0)
MCHC: 33.7 g/dL (ref 31.5–35.7)
MCV: 89 fL (ref 79–97)
Monocytes Absolute: 0.7 10*3/uL (ref 0.1–0.9)
Monocytes: 7 %
NRBC: 2 % — ABNORMAL HIGH (ref 0–0)
Neutrophils Absolute: 5.5 10*3/uL (ref 1.4–7.0)
Neutrophils: 60 %
Platelets: 494 10*3/uL — ABNORMAL HIGH (ref 150–450)
Potassium: 5.5 mmol/L — ABNORMAL HIGH (ref 3.5–5.2)
RBC: 2.9 x10E6/uL — ABNORMAL LOW (ref 4.14–5.80)
RDW: 20.5 % — ABNORMAL HIGH (ref 11.6–15.4)
Retic Ct Pct: 7 % — ABNORMAL HIGH (ref 0.6–2.6)
Sodium: 136 mmol/L (ref 134–144)
Total Protein: 8.2 g/dL (ref 6.0–8.5)
Vit D, 25-Hydroxy: 10.8 ng/mL — ABNORMAL LOW (ref 30.0–100.0)
WBC: 9.1 10*3/uL (ref 3.4–10.8)
eGFR: 116 mL/min/{1.73_m2} (ref >59)

## 2022-02-10 MED ORDER — VITAMIN D (ERGOCALCIFEROL) 1.25 MG (50000 UNIT) PO CAPS: 50000.0000 [IU] | ORAL_CAPSULE | ORAL | 0 refills | Status: DC

## 2022-02-23 NOTE — Telephone Encounter (Signed)
Error

## 2022-03-17 ENCOUNTER — Other Ambulatory Visit: Payer: Self-pay | Admitting: Family Medicine

## 2022-03-17 ENCOUNTER — Telehealth: Payer: Self-pay

## 2022-03-17 DIAGNOSIS — D571 Sickle-cell disease without crisis: Secondary | ICD-10-CM

## 2022-03-17 MED ORDER — OXYCODONE-ACETAMINOPHEN 10-325 MG PO TABS: 1.0000 | ORAL_TABLET | Freq: Four times a day (QID) | ORAL | 0 refills | Status: DC | PRN

## 2022-03-17 NOTE — Telephone Encounter (Addendum)
[place]

## 2022-03-17 NOTE — Progress Notes (Signed)
Reviewed PDMP substance reporting system prior to prescribing opiate medications. No inconsistencies noted.  Meds ordered this encounter  Medications   oxyCODONE-acetaminophen (PERCOCET) 10-325 MG tablet    Sig: Take 1 tablet by mouth every 6 (six) hours as needed for pain.    Dispense:  90 tablet    Refill:  0    Order Specific Question:   Supervising Provider    Answer:   Tresa Garter [id number]   .Liliahna Cudd

## 2022-04-15 ENCOUNTER — Telehealth: Payer: Self-pay

## 2022-04-15 ENCOUNTER — Other Ambulatory Visit: Payer: Self-pay | Admitting: Family Medicine

## 2022-04-15 DIAGNOSIS — D571 Sickle-cell disease without crisis: Secondary | ICD-10-CM

## 2022-04-15 MED ORDER — OXYCODONE-ACETAMINOPHEN 10-325 MG PO TABS: 1.0000 | ORAL_TABLET | Freq: Four times a day (QID) | ORAL | 0 refills | Status: DC | PRN

## 2022-04-15 NOTE — Progress Notes (Signed)
Reviewed PDMP substance reporting system prior to prescribing opiate medications. No inconsistencies noted.  Meds ordered this encounter  Medications   oxyCODONE-acetaminophen (PERCOCET) 10-325 MG tablet    Sig: Take 1 tablet by mouth every 6 (six) hours as needed for pain.    Dispense:  90 tablet    Refill:  0    Order Specific Question:   Supervising Provider    Answer:   JEGEDE, OLUGBEMIGA E [[id number]]   [name] [place] Moore Zareya Tuckett  APRN, MSN, FNP-C [facility] [address]  [place], [place] [address] [phone]  

## 2022-04-15 NOTE — Telephone Encounter (Signed)
Percocet

## 2022-05-12 ENCOUNTER — Ambulatory Visit (INDEPENDENT_AMBULATORY_CARE_PROVIDER_SITE_OTHER): Payer: Medicare Other | Admitting: Nurse Practitioner

## 2022-05-12 VITALS — BP 112/63 | HR 71 | Temp 98.6°F | Resp 18 | Ht 73.0 in | Wt 147.0 lb

## 2022-05-12 DIAGNOSIS — D571 Sickle-cell disease without crisis: Secondary | ICD-10-CM | POA: Diagnosis not present

## 2022-05-12 MED ORDER — OXYCODONE-ACETAMINOPHEN 10-325 MG PO TABS: 1.0000 | ORAL_TABLET | Freq: Four times a day (QID) | ORAL | 0 refills | Status: DC | PRN

## 2022-05-12 NOTE — Progress Notes (Signed)
$'@Patient'm$  ID: [name], male    DOB: 1981/03/16, 41 y.o.   MRN: [id number]  Chief Complaint  Patient presents with   Follow-up    Pt is here for 3 month's SCD follow up visit.    Referring provider: Bo Merino I, NP   HPI  Patient presents today for follow-up with sickle cell.  Overall he has been doing well.  His blood pressure is low in office today.  We will check labs today.  Patient does need refill medications today. Denies f/c/s, n/v/d, hemoptysis, PND, leg swelling Denies chest pain or edema     No Known Allergies  Immunization History  Administered Date(s) Administered   Influenza,inj,Quad PF,6+ Mos 07/03/2014   Pneumococcal Conjugate-13 01/01/2015   Tdap 08/26/2013    Past Medical History:  Diagnosis Date   Cancer ([place])    Sickle cell anemia (HCC)     Tobacco History: Social History   Tobacco Use  Smoking Status Never  Smokeless Tobacco Never   Counseling given: Not Answered   Outpatient Encounter Medications as of 05/12/2022  Medication Sig   hydroxyurea (HYDREA) 500 MG capsule May take with food to minimize GI side effects.   [DISCONTINUED] oxyCODONE-acetaminophen (PERCOCET) 10-325 MG tablet Take 1 tablet by mouth every 6 (six) hours as needed for pain.   [DISCONTINUED] Vitamin D, Ergocalciferol, (DRISDOL) 1.25 MG (50000 UNIT) CAPS capsule Take 1 capsule (50,000 Units total) by mouth every 7 (seven) days.   cholecalciferol (VITAMIN D3) 25 MCG (1000 UNIT) tablet Take 1,000 Units by mouth daily. (Patient not taking: Reported on 4/00/8676)   folic acid (FOLVITE) 195 MCG tablet Take 400 mcg by mouth daily. (Patient not taking: Reported on 05/12/2022)   oxyCODONE-acetaminophen (PERCOCET) 10-325 MG tablet Take 1 tablet by mouth every 6 (six) hours as needed for pain.   No facility-administered encounter medications on file as of 05/12/2022.     Review of Systems  Review of Systems  Constitutional: Negative.   HENT: Negative.    Cardiovascular:  Negative.   Gastrointestinal: Negative.   Allergic/Immunologic: Negative.   Neurological: Negative.   Psychiatric/Behavioral: Negative.         Physical Exam  BP 112/63   Pulse 71   Temp 98.6 F (37 C)   Resp 18   Ht '6\' 1"'$  (1.854 m)   Wt 147 lb (66.7 kg)   SpO2 100%   BMI 19.39 kg/m   Wt Readings from Last 5 Encounters:  05/12/22 147 lb (66.7 kg)  02/09/22 148 lb (67.1 kg)  11/08/21 149 lb 6 oz (67.8 kg)  10/28/21 155 lb (70.3 kg)  05/05/21 142 lb 9.6 oz (64.7 kg)     Physical Exam Vitals and nursing note reviewed.  Constitutional:      General: He is not in acute distress.    Appearance: He is well-developed.  Cardiovascular:     Rate and Rhythm: Normal rate and regular rhythm.  Pulmonary:     Effort: Pulmonary effort is normal.     Breath sounds: Normal breath sounds.  Skin:    General: Skin is warm and dry.  Neurological:     Mental Status: He is alert and oriented to person, place, and time.      Lab Results:  CBC    Component Value Date/Time   WBC 9.1 05/12/2022 1503   WBC 18.9 (H) 10/28/2021 2138   RBC 3.02 (L) 05/12/2022 1503   RBC 3.12 (L) 10/28/2021 2138   RBC 3.09 (L) 10/28/2021 2138  HGB 9.4 (L) 05/12/2022 1503   HCT 26.6 (L) 05/12/2022 1503   PLT 491 (H) 05/12/2022 1503   MCV 88 05/12/2022 1503   MCH 31.1 05/12/2022 1503   MCH 31.1 10/28/2021 2138   MCHC 35.3 05/12/2022 1503   MCHC 35.3 10/28/2021 2138   RDW 19.1 (H) 05/12/2022 1503   LYMPHSABS 2.4 05/12/2022 1503   MONOABS 1.0 10/28/2021 2138   EOSABS 1.0 (H) 05/12/2022 1503   BASOSABS 0.1 05/12/2022 1503    BMET    Component Value Date/Time   NA 135 05/12/2022 1503   K 4.8 05/12/2022 1503   CL 98 05/12/2022 1503   CO2 22 05/12/2022 1503   GLUCOSE 93 05/12/2022 1503   GLUCOSE 94 10/28/2021 2138   BUN 8 05/12/2022 1503   CREATININE 0.82 05/12/2022 1503   CREATININE 0.76 03/08/2017 1047   CALCIUM 9.4 05/12/2022 1503   GFRNONAA >60 10/28/2021 2138   GFRNONAA >89  03/08/2017 1047   GFRAA 135 04/17/2020 1412   GFRAA >89 03/08/2017 1047    BNP No results found for: "BNP"  ProBNP No results found for: "PROBNP"  Imaging: No results found.   Assessment & Plan:   Hb-SS disease without crisis ([place]) - [id number] 11+Oxyco+Alc+Crt-Bund - Sickle Cell Panel    Follow up:  Follow up in 3 months      Fenton Foy, NP 05/13/2022

## 2022-05-12 NOTE — Patient Instructions (Addendum)
1. Hb-SS disease without crisis [facility])  - [id number] 11+Oxyco+Alc+Crt-Bund - Sickle Cell Panel    Follow up:  Follow up in 3 months

## 2022-05-13 ENCOUNTER — Encounter: Payer: Self-pay | Admitting: Nurse Practitioner

## 2022-05-13 ENCOUNTER — Other Ambulatory Visit: Payer: Self-pay | Admitting: Nurse Practitioner

## 2022-05-13 DIAGNOSIS — E559 Vitamin D deficiency, unspecified: Secondary | ICD-10-CM

## 2022-05-13 LAB — CMP14+CBC/D/PLT+FER+RETIC+V...
ALT: 39 IU/L (ref 0–44)
AST: 42 IU/L — ABNORMAL HIGH (ref 0–40)
Albumin/Globulin Ratio: 1.4 (ref 1.2–2.2)
Albumin: 4.9 g/dL (ref 4.1–5.1)
Alkaline Phosphatase: 77 IU/L (ref 44–121)
BUN/Creatinine Ratio: 10 (ref 9–20)
BUN: 8 mg/dL (ref 6–20)
Basophils Absolute: 0.1 10*3/uL (ref 0.0–0.2)
Basos: 1 %
Bilirubin Total: 2.6 mg/dL — ABNORMAL HIGH (ref 0.0–1.2)
CO2: 22 mmol/L (ref 20–29)
Calcium: 9.4 mg/dL (ref 8.7–10.2)
Chloride: 98 mmol/L (ref 96–106)
Creatinine, Ser: 0.82 mg/dL (ref 0.76–1.27)
EOS (ABSOLUTE): 1 10*3/uL — ABNORMAL HIGH (ref 0.0–0.4)
Eos: 11 %
Ferritin: 712 ng/mL — ABNORMAL HIGH (ref 30–400)
Globulin, Total: 3.5 g/dL (ref 1.5–4.5)
Glucose: 93 mg/dL (ref 70–99)
Hematocrit: 26.6 % — ABNORMAL LOW (ref 37.5–51.0)
Hemoglobin: 9.4 g/dL — ABNORMAL LOW (ref 13.0–17.7)
Immature Grans (Abs): 0 10*3/uL (ref 0.0–0.1)
Immature Granulocytes: 0 %
Lymphocytes Absolute: 2.4 10*3/uL (ref 0.7–3.1)
Lymphs: 27 %
MCH: 31.1 pg (ref 26.6–33.0)
MCHC: 35.3 g/dL (ref 31.5–35.7)
MCV: 88 fL (ref 79–97)
Monocytes Absolute: 0.7 10*3/uL (ref 0.1–0.9)
Monocytes: 8 %
Neutrophils Absolute: 4.9 10*3/uL (ref 1.4–7.0)
Neutrophils: 53 %
Platelets: 491 10*3/uL — ABNORMAL HIGH (ref 150–450)
Potassium: 4.8 mmol/L (ref 3.5–5.2)
RBC: 3.02 x10E6/uL — ABNORMAL LOW (ref 4.14–5.80)
RDW: 19.1 % — ABNORMAL HIGH (ref 11.6–15.4)
Retic Ct Pct: 3.5 % — ABNORMAL HIGH (ref 0.6–2.6)
Sodium: 135 mmol/L (ref 134–144)
Total Protein: 8.4 g/dL (ref 6.0–8.5)
Vit D, 25-Hydroxy: 12.8 ng/mL — ABNORMAL LOW (ref 30.0–100.0)
WBC: 9.1 10*3/uL (ref 3.4–10.8)
eGFR: 115 mL/min/{1.73_m2} (ref >59)

## 2022-05-13 MED ORDER — VITAMIN D (ERGOCALCIFEROL) 1.25 MG (50000 UNIT) PO CAPS: 50000.0000 [IU] | ORAL_CAPSULE | ORAL | 0 refills | Status: DC

## 2022-05-13 NOTE — Assessment & Plan Note (Signed)
-   [id number] 11+Oxyco+Alc+Crt-Bund - Sickle Cell Panel    Follow up:  Follow up in 3 months

## 2022-05-17 ENCOUNTER — Other Ambulatory Visit: Payer: Self-pay | Admitting: Family Medicine

## 2022-05-17 ENCOUNTER — Other Ambulatory Visit (HOSPITAL_COMMUNITY): Payer: Self-pay

## 2022-05-17 DIAGNOSIS — D571 Sickle-cell disease without crisis: Secondary | ICD-10-CM

## 2022-05-17 MED ORDER — OXYCODONE-ACETAMINOPHEN 10-325 MG PO TABS: 1.0000 | ORAL_TABLET | Freq: Four times a day (QID) | ORAL | 0 refills | Status: DC | PRN

## 2022-05-17 MED ORDER — OXYCODONE-ACETAMINOPHEN 10-325 MG PO TABS
1.0000 | ORAL_TABLET | Freq: Four times a day (QID) | ORAL | 0 refills | Status: DC | PRN
  Filled 2022-05-17: qty 60, 15d supply, fill #0

## 2022-05-17 NOTE — Progress Notes (Signed)
Reviewed PDMP substance reporting system prior to prescribing opiate medications. No inconsistencies noted.  Meds ordered this encounter  Medications   oxyCODONE-acetaminophen (PERCOCET) 10-325 MG tablet    Sig: Take 1 tablet by mouth every 6 (six) hours as needed for pain.    Dispense:  60 tablet    Refill:  0    Order Specific Question:   Supervising Provider    Answer:   JEGEDE, OLUGBEMIGA E [[id number]]   [name] Moore [name]  APRN, MSN, FNP-C [facility] [address]  [place], [place] [address] [phone]  

## 2022-05-17 NOTE — Progress Notes (Signed)
Pharmacy changed to [place], MSN, FNP-C Patient [place] [address] [place], [place] [address] [phone]

## 2022-05-18 LAB — DRUG SCREEN 764883 11+OXYCO+ALC+CRT-BUND
Amphetamines, Urine: NEGATIVE ng/mL
BENZODIAZ UR QL: NEGATIVE ng/mL
Barbiturate: NEGATIVE ng/mL
Cannabinoid Quant, Ur: NEGATIVE ng/mL
Cocaine (Metabolite): NEGATIVE ng/mL
Creatinine: 117.8 mg/dL (ref 20.0–300.0)
Ethanol: NEGATIVE %
Meperidine: NEGATIVE ng/mL
Methadone Screen, Urine: NEGATIVE ng/mL
OPIATE SCREEN URINE: NEGATIVE ng/mL
Phencyclidine: NEGATIVE ng/mL
Propoxyphene: NEGATIVE ng/mL
Tramadol: NEGATIVE ng/mL
pH, Urine: 5.5 (ref 4.5–8.9)

## 2022-05-18 LAB — OXYCODONE/OXYMORPHONE, CONFIRM
OXYCODONE/OXYMORPH: POSITIVE — AB
OXYCODONE: 1266 ng/mL
OXYCODONE: POSITIVE — AB
OXYMORPHONE (GC/MS): 1550 ng/mL
OXYMORPHONE: POSITIVE — AB

## 2022-06-07 ENCOUNTER — Telehealth: Payer: Self-pay

## 2022-06-07 NOTE — Telephone Encounter (Signed)
Percocet

## 2022-06-08 ENCOUNTER — Other Ambulatory Visit: Payer: Self-pay | Admitting: Family Medicine

## 2022-06-08 ENCOUNTER — Other Ambulatory Visit: Payer: Self-pay | Admitting: Nurse Practitioner

## 2022-06-08 DIAGNOSIS — D571 Sickle-cell disease without crisis: Secondary | ICD-10-CM

## 2022-06-08 MED ORDER — OXYCODONE-ACETAMINOPHEN 10-325 MG PO TABS: 1.0000 | ORAL_TABLET | Freq: Four times a day (QID) | ORAL | 0 refills | Status: DC | PRN

## 2022-06-08 NOTE — Progress Notes (Signed)
Reviewed PDMP substance reporting system prior to prescribing opiate medications. No inconsistencies noted.  Meds ordered this encounter  Medications   oxyCODONE-acetaminophen (PERCOCET) 10-325 MG tablet    Sig: Take 1 tablet by mouth every 6 (six) hours as needed for pain.    Dispense:  60 tablet    Refill:  0    Order Specific Question:   Supervising Provider    Answer:   JEGEDE, OLUGBEMIGA E [[id number]]   [name] [name] Johnette Teigen  APRN, MSN, FNP-C [facility] [address]  [place], [place] [address] [phone]  

## 2022-06-09 ENCOUNTER — Other Ambulatory Visit (HOSPITAL_COMMUNITY): Payer: Self-pay

## 2022-06-09 ENCOUNTER — Telehealth: Payer: Self-pay

## 2022-06-09 ENCOUNTER — Other Ambulatory Visit: Payer: Self-pay | Admitting: Family Medicine

## 2022-06-09 DIAGNOSIS — D571 Sickle-cell disease without crisis: Secondary | ICD-10-CM

## 2022-06-09 MED ORDER — OXYCODONE-ACETAMINOPHEN 10-325 MG PO TABS
1.0000 | ORAL_TABLET | Freq: Four times a day (QID) | ORAL | 0 refills | Status: DC | PRN
  Filled 2022-06-09: qty 60, 15d supply, fill #0

## 2022-06-09 NOTE — Telephone Encounter (Signed)
[facility] has it in stock

## 2022-06-09 NOTE — Progress Notes (Signed)
Patient's pharmacy changed to [facility] and medication was resent.   Donia Pounds  APRN, MSN, FNP-C Patient [name] [address] [place], [place] [address] [phone]

## 2022-06-10 ENCOUNTER — Other Ambulatory Visit (HOSPITAL_COMMUNITY): Payer: Self-pay

## 2022-06-10 ENCOUNTER — Other Ambulatory Visit: Payer: Self-pay | Admitting: Nurse Practitioner

## 2022-06-10 NOTE — Telephone Encounter (Signed)
It looks like [place] already refilled this medication. Thanks.

## 2022-06-28 ENCOUNTER — Telehealth: Payer: Self-pay | Admitting: Nurse Practitioner

## 2022-06-28 NOTE — Telephone Encounter (Signed)
Percocet

## 2022-06-29 ENCOUNTER — Telehealth: Payer: Self-pay | Admitting: Nurse Practitioner

## 2022-06-29 ENCOUNTER — Other Ambulatory Visit: Payer: Self-pay | Admitting: Family Medicine

## 2022-06-29 ENCOUNTER — Telehealth: Payer: Self-pay | Admitting: Family Medicine

## 2022-06-29 DIAGNOSIS — D571 Sickle-cell disease without crisis: Secondary | ICD-10-CM

## 2022-06-29 MED ORDER — OXYCODONE-ACETAMINOPHEN 10-325 MG PO TABS: 1.0000 | ORAL_TABLET | Freq: Four times a day (QID) | ORAL | 0 refills | Status: DC | PRN

## 2022-06-29 NOTE — Progress Notes (Signed)
Reviewed PDMP substance reporting system prior to prescribing opiate medications. No inconsistencies noted.  Meds ordered this encounter  Medications   oxyCODONE-acetaminophen (PERCOCET) 10-325 MG tablet    Sig: Take 1 tablet by mouth every 6 (six) hours as needed for pain.    Dispense:  60 tablet    Refill:  0    Order Specific Question:   Supervising Provider    Answer:   JEGEDE, OLUGBEMIGA E [[id number]]   [name] Dulude Moore Timothy Trudell  APRN, MSN, FNP-C [facility] [address]  [place], [place] [address] [phone]  

## 2022-06-29 NOTE — Telephone Encounter (Signed)
Pt called asking if there was a different medication he can be prescribed instead of the oxycodone that will do the same thing for his pain that the oxycodone does due to the oxycodone always being out of stock at the pharmacy

## 2022-06-29 NOTE — Progress Notes (Signed)
Reviewed PDMP substance reporting system prior to prescribing opiate medications. No inconsistencies noted.  Meds ordered this encounter  Medications   oxyCODONE-acetaminophen (PERCOCET) 10-325 MG tablet    Sig: Take 1 tablet by mouth every 6 (six) hours as needed for pain.    Dispense:  60 tablet    Refill:  0    Order Specific Question:   Supervising Provider    Answer:   JEGEDE, OLUGBEMIGA E [[id number]]   [name] Thijs Brunton  APRN, MSN, FNP-C [facility] [address]  [place], [place] [address] [phone]  

## 2022-06-29 NOTE — Telephone Encounter (Signed)
Please send Oxycodone refill to [facility] -- they have med in stock today and pt is feeling mild crisis coming along but would like to continue with his day hopefully   Pt still would like to discuss if there is a different medication that could replace his oxycodone that does the same thing due to having to deal with the oxycodone frequently being out of stock when it is time for his prescription to be refilled.

## 2022-07-19 ENCOUNTER — Other Ambulatory Visit (HOSPITAL_COMMUNITY): Payer: Self-pay

## 2022-07-19 ENCOUNTER — Other Ambulatory Visit: Payer: Self-pay | Admitting: Family Medicine

## 2022-07-19 ENCOUNTER — Telehealth: Payer: Self-pay | Admitting: Nurse Practitioner

## 2022-07-19 DIAGNOSIS — D571 Sickle-cell disease without crisis: Secondary | ICD-10-CM

## 2022-07-19 MED ORDER — OXYCODONE-ACETAMINOPHEN 10-325 MG PO TABS
1.0000 | ORAL_TABLET | Freq: Four times a day (QID) | ORAL | 0 refills | Status: DC | PRN
  Filled 2022-07-19: qty 60, 15d supply, fill #0

## 2022-07-19 NOTE — Progress Notes (Signed)
Reviewed PDMP substance reporting system prior to prescribing opiate medications. No inconsistencies noted.  Meds ordered this encounter  Medications   oxyCODONE-acetaminophen (PERCOCET) 10-325 MG tablet    Sig: Take 1 tablet by mouth every 6 (six) hours as needed for pain.    Dispense:  60 tablet    Refill:  0    Order Specific Question:   Supervising Provider    Answer:   JEGEDE, OLUGBEMIGA E [[id number]]   [name] Nedeau Moore Darious Rehman  APRN, MSN, FNP-C [facility] [address]  [place], [place] [address] [phone]  

## 2022-07-19 NOTE — Telephone Encounter (Signed)
Oxycodone  °

## 2022-08-04 ENCOUNTER — Telehealth: Payer: Self-pay | Admitting: Nurse Practitioner

## 2022-08-04 ENCOUNTER — Other Ambulatory Visit: Payer: Self-pay | Admitting: Nurse Practitioner

## 2022-08-04 DIAGNOSIS — D571 Sickle-cell disease without crisis: Secondary | ICD-10-CM

## 2022-08-04 MED ORDER — OXYCODONE-ACETAMINOPHEN 10-325 MG PO TABS: 1.0000 | ORAL_TABLET | Freq: Four times a day (QID) | ORAL | 0 refills | Status: DC | PRN

## 2022-08-04 NOTE — Telephone Encounter (Signed)
Caller & Relationship to patient:  MRN #  [id number]   Call Back Number:   Date of Last Office Visit: 07/19/2022     Date of Next Office Visit: 08/11/2022    Medication(s) to be Refilled: Percocet  Preferred Pharmacy:   ** Please notify patient to allow 48-72 hours to process** **Let patient know to contact pharmacy at the end of the day to make sure medication is ready. ** **If patient has not been seen in a year or longer, book an appointment **Advise to use MyChart for refill requests OR to contact their pharmacy

## 2022-08-11 ENCOUNTER — Ambulatory Visit (INDEPENDENT_AMBULATORY_CARE_PROVIDER_SITE_OTHER): Payer: Medicare Other | Admitting: Nurse Practitioner

## 2022-08-11 ENCOUNTER — Encounter: Payer: Self-pay | Admitting: Nurse Practitioner

## 2022-08-11 VITALS — BP 102/62 | HR 73 | Ht 73.0 in | Wt 148.4 lb

## 2022-08-11 DIAGNOSIS — D571 Sickle-cell disease without crisis: Secondary | ICD-10-CM

## 2022-08-11 MED ORDER — HYDROXYUREA 500 MG PO CAPS: ORAL_CAPSULE | ORAL | 1 refills | Status: DC

## 2022-08-11 NOTE — Patient Instructions (Addendum)
1. Hb-SS disease without crisis ([name])  - Sickle Cell Panel    Follow up:  Follow up in 3 months

## 2022-08-11 NOTE — Assessment & Plan Note (Signed)
-   Sickle Cell Panel    Follow up:  Follow up in 3 months

## 2022-08-11 NOTE — Progress Notes (Signed)
 @  Patient ID: [name], male    DOB: 1980/09/08, 41 y.o.   MRN: [id number]  Chief Complaint  Patient presents with   Follow-up    Sickle cell    Referring provider: Bo Merino I, NP   HPI  Patient presents today for follow-up with sickle cell.  Overall he has been doing well.  His blood pressure is low in office today.  We will check labs today.  Patient does need refill medications today. Denies f/c/s, n/v/d, hemoptysis, PND, leg swelling Denies chest pain or edema.     No Known Allergies  Immunization History  Administered Date(s) Administered   Influenza,inj,Quad PF,6+ Mos 07/03/2014   Pneumococcal Conjugate-13 01/01/2015   Tdap 08/26/2013    Past Medical History:  Diagnosis Date   Cancer ([place])    Sickle cell anemia (HCC)     Tobacco History: Social History   Tobacco Use  Smoking Status Never  Smokeless Tobacco Never   Counseling given: Not Answered   Outpatient Encounter Medications as of 08/11/2022  Medication Sig   folic acid (FOLVITE) 111 MCG tablet Take 400 mcg by mouth daily.   hydroxyurea (HYDREA) 500 MG capsule May take with food to minimize GI side effects.   oxyCODONE-acetaminophen (PERCOCET) 10-325 MG tablet Take 1 tablet by mouth every 6 (six) hours as needed for pain.   Vitamin D, Ergocalciferol, (DRISDOL) 1.25 MG (50000 UNIT) CAPS capsule Take 1 capsule (50,000 Units total) by mouth every 7 (seven) days.   cholecalciferol (VITAMIN D3) 25 MCG (1000 UNIT) tablet Take 1,000 Units by mouth daily. (Patient not taking: Reported on 08/11/2022)   No facility-administered encounter medications on file as of 08/11/2022.     Review of Systems  Review of Systems  Constitutional: Negative.   HENT: Negative.    Cardiovascular: Negative.   Gastrointestinal: Negative.   Allergic/Immunologic: Negative.   Neurological: Negative.   Psychiatric/Behavioral: Negative.         Physical Exam  BP 102/62   Pulse 73   Ht '6\' 1"'$  (1.854 m)    Wt 148 lb 6.4 oz (67.3 kg)   SpO2 96%   BMI 19.58 kg/m   Wt Readings from Last 5 Encounters:  08/11/22 148 lb 6.4 oz (67.3 kg)  05/12/22 147 lb (66.7 kg)  02/09/22 148 lb (67.1 kg)  11/08/21 149 lb 6 oz (67.8 kg)  10/28/21 155 lb (70.3 kg)     Physical Exam Vitals and nursing note reviewed.  Constitutional:      General: He is not in acute distress.    Appearance: He is well-developed.  Cardiovascular:     Rate and Rhythm: Normal rate and regular rhythm.  Pulmonary:     Effort: Pulmonary effort is normal.     Breath sounds: Normal breath sounds.  Skin:    General: Skin is warm and dry.  Neurological:     Mental Status: He is alert and oriented to person, place, and time.       Assessment & Plan:   Hb-SS disease without crisis ([place]) - Sickle Cell Panel    Follow up:  Follow up in 3 months      Fenton Foy, NP 08/11/2022

## 2022-08-12 ENCOUNTER — Telehealth: Payer: Self-pay | Admitting: *Deleted

## 2022-08-12 LAB — CMP14+CBC/D/PLT+FER+RETIC+V...
ALT: 13 IU/L (ref 0–44)
AST: 22 IU/L (ref 0–40)
Albumin/Globulin Ratio: 1.5 (ref 1.2–2.2)
Albumin: 4.5 g/dL (ref 4.1–5.1)
Alkaline Phosphatase: 69 IU/L (ref 44–121)
BUN/Creatinine Ratio: 6 — ABNORMAL LOW (ref 9–20)
BUN: 5 mg/dL — ABNORMAL LOW (ref 6–20)
Basophils Absolute: 0.1 10*3/uL (ref 0.0–0.2)
Basos: 1 %
Bilirubin Total: 2.7 mg/dL — ABNORMAL HIGH (ref 0.0–1.2)
CO2: 25 mmol/L (ref 20–29)
Calcium: 9.3 mg/dL (ref 8.7–10.2)
Chloride: 105 mmol/L (ref 96–106)
Creatinine, Ser: 0.77 mg/dL (ref 0.76–1.27)
EOS (ABSOLUTE): 0.3 10*3/uL (ref 0.0–0.4)
Eos: 4 %
Ferritin: 175 ng/mL (ref 30–400)
Globulin, Total: 3 g/dL (ref 1.5–4.5)
Glucose: 85 mg/dL (ref 70–99)
Hematocrit: 25.5 % — ABNORMAL LOW (ref 37.5–51.0)
Hemoglobin: 8.9 g/dL — ABNORMAL LOW (ref 13.0–17.7)
Immature Grans (Abs): 0.1 10*3/uL (ref 0.0–0.1)
Immature Granulocytes: 1 %
Lymphocytes Absolute: 2.5 10*3/uL (ref 0.7–3.1)
Lymphs: 27 %
MCH: 30.9 pg (ref 26.6–33.0)
MCHC: 34.9 g/dL (ref 31.5–35.7)
MCV: 89 fL (ref 79–97)
Monocytes Absolute: 1 10*3/uL — ABNORMAL HIGH (ref 0.1–0.9)
Monocytes: 11 %
NRBC: 4 % — ABNORMAL HIGH (ref 0–0)
Neutrophils Absolute: 5.3 10*3/uL (ref 1.4–7.0)
Neutrophils: 56 %
Platelets: 388 10*3/uL (ref 150–450)
Potassium: 4.7 mmol/L (ref 3.5–5.2)
RBC: 2.88 x10E6/uL — ABNORMAL LOW (ref 4.14–5.80)
RDW: 17.9 % — ABNORMAL HIGH (ref 11.6–15.4)
Retic Ct Pct: 8.8 % — ABNORMAL HIGH (ref 0.6–2.6)
Sodium: 137 mmol/L (ref 134–144)
Total Protein: 7.5 g/dL (ref 6.0–8.5)
Vit D, 25-Hydroxy: 13 ng/mL — ABNORMAL LOW (ref 30.0–100.0)
WBC: 9.4 10*3/uL (ref 3.4–10.8)
eGFR: 117 mL/min/{1.73_m2} (ref >59)

## 2022-08-12 NOTE — Telephone Encounter (Signed)
[place] called needed to verification Hydroxyurea 500 mg --needed instructions amount to take. Please advise.

## 2022-08-17 ENCOUNTER — Other Ambulatory Visit: Payer: Self-pay | Admitting: Nurse Practitioner

## 2022-08-17 DIAGNOSIS — D571 Sickle-cell disease without crisis: Secondary | ICD-10-CM

## 2022-08-17 MED ORDER — HYDROXYUREA 500 MG PO CAPS: ORAL_CAPSULE | ORAL | 1 refills | Status: DC

## 2022-08-25 ENCOUNTER — Ambulatory Visit: Payer: No Typology Code available for payment source

## 2022-08-25 VITALS — Ht 74.0 in | Wt 148.0 lb

## 2022-08-25 DIAGNOSIS — Z0000 Encounter for general adult medical examination without abnormal findings: Secondary | ICD-10-CM

## 2022-08-25 NOTE — Progress Notes (Signed)
Subjective:   [name] is a 42 y.o. male who presents for Medicare Annual/Subsequent preventive examination.  I connected with  [name] on 08/25/22 by a audio enabled telemedicine application and verified that I am speaking with the correct person using two identifiers.  Patient Location: Home  Provider Location: Home Office  I discussed the limitations of evaluation and management by telemedicine. The patient expressed understanding and agreed to proceed.   Review of Systems     Cardiac Risk Factors include: sedentary lifestyle;male gender;Other (see comment), Risk factor comments: sickle cell anemia     Objective:    Today's Vitals   08/25/22 1540 08/25/22 1541  Weight: 148 lb (67.1 kg)   Height: '6\' 2"'$  (1.88 m)   PainSc:  1    Body mass index is 19 kg/m.     08/25/2022    3:48 PM 10/28/2021    8:36 PM 05/11/2021    1:12 PM 04/27/2021   12:45 PM 02/02/2021   10:44 AM 02/01/2021    4:49 PM 12/23/2020    4:33 PM  Advanced Directives  Does Patient Have a Medical Advance Directive? No No No No No No No  Would patient like information on creating a medical advance directive? No - Patient declined No - Patient declined No - Patient declined No - Patient declined No - Patient declined  No - Patient declined    Current Medications (verified) Outpatient Encounter Medications as of 08/25/2022  Medication Sig   folic acid (FOLVITE) 676 MCG tablet Take 400 mcg by mouth daily.   hydroxyurea (HYDREA) 500 MG capsule Take 500 mg BID PO. May take with food to minimize GI side effects.   oxyCODONE-acetaminophen (PERCOCET) 10-325 MG tablet Take 1 tablet by mouth every 6 (six) hours as needed for pain.   Vitamin D, Ergocalciferol, (DRISDOL) 1.25 MG (50000 UNIT) CAPS capsule Take 1 capsule (50,000 Units total) by mouth every 7 (seven) days.   cholecalciferol (VITAMIN D3) 25 MCG (1000 UNIT) tablet Take 1,000 Units by mouth daily. (Patient not taking: Reported on 08/25/2022)   No  facility-administered encounter medications on file as of 08/25/2022.    Allergies (verified) Patient has no known allergies.   History: Past Medical History:  Diagnosis Date   Cancer ([place])    Sickle cell anemia (HCC)    Past Surgical History:  Procedure Laterality Date   CHOLECYSTECTOMY     Family History  Problem Relation Age of Onset   Sickle cell anemia Mother    Sickle cell trait Father    Sickle cell trait Sister    Sickle cell trait Brother    Cancer Maternal Grandmother    Social History   Socioeconomic History   Marital status: Single    Spouse name: Not on file   Number of children: Not on file   Years of education: Not on file   Highest education level: Associate degree: occupational, Hotel manager, or vocational program  Occupational History   Not on file  Tobacco Use   Smoking status: Never   Smokeless tobacco: Never  Vaping Use   Vaping Use: Never used  Substance and Sexual Activity   Alcohol use: No   Drug use: No   Sexual activity: Not Currently  Other Topics Concern   Not on file  Social History Narrative   Pt lives at home with girlfriend.   Social Determinants of Health   Financial Resource Strain: Low Risk  (08/25/2022)   Overall Financial Resource Strain (CARDIA)  Difficulty of Paying Living Expenses: Not hard at all  Food Insecurity: No Food Insecurity (08/25/2022)   Hunger Vital Sign    Worried About Running Out of Food in the Last Year: Never true    Ran Out of Food in the Last Year: Never true  Transportation Needs: No Transportation Needs (08/25/2022)   PRAPARE - Hydrologist (Medical): No    Lack of Transportation (Non-Medical): No  Physical Activity: Inactive (08/25/2022)   Exercise Vital Sign    Days of Exercise per Week: 0 days    Minutes of Exercise per Session: 0 min  Stress: No Stress Concern Present (08/25/2022)   [place]     Feeling of Stress : Not at all  Social Connections: [place] (08/25/2022)   Social Connection and Isolation Panel [NHANES]    Frequency of Communication with Friends and Family: More than three times a week    Frequency of Social Gatherings with Friends and Family: More than three times a week    Attends Religious Services: 1 to 4 times per year    Active Member of [employer] or Organizations: No    Attends Music therapist: 1 to 4 times per year    Marital Status: Living with partner    Tobacco Counseling Counseling given: Not Answered   Clinical Intake:  Pre-visit preparation completed: Yes  Pain : 0-10 Pain Score: 1  Pain Type: Chronic pain Pain Location: Back Pain Orientation: Lower Pain Radiating Towards: hips Pain Descriptors / Indicators: Aching Pain Onset: 1 to 4 weeks ago Pain Frequency: Several days a week     BMI - recorded: 19 Nutritional Status: BMI of 19-24  Normal Nutritional Risks: None Diabetes: No  How often do you need to have someone help you when you read instructions, pamphlets, or other written materials from your doctor or pharmacy?: 1 - Never  Diabetic? no  Interpreter Needed?: No  Information entered by :: Marwin Primmer, LPN   Activities of Daily Living    08/25/2022    3:48 PM  In your present state of health, do you have any difficulty performing the following activities:  Hearing? 0  Vision? 0  Difficulty concentrating or making decisions? 0  Walking or climbing stairs? 0  Dressing or bathing? 0  Doing errands, shopping? 0  Preparing Food and eating ? N  Using the Toilet? N  In the past six months, have you accidently leaked urine? N  Do you have problems with loss of bowel control? N  Managing your Medications? N  Managing your Finances? N  Housekeeping or managing your Housekeeping? N    Patient Care Team: Fenton Foy, NP as PCP - General (Pulmonary Disease)  Indicate any recent Medical Services you  may have received from other than [facility] providers in the past year (date may be approximate).     Assessment:   This is a routine wellness examination for Skippy.  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       Depression Screen    08/25/2022    3:46 PM 08/11/2022    1:51 PM 05/12/2022    1:59 PM 02/09/2022    1:49 PM 02/09/2022    1:48 PM 05/11/2021    1:23 PM 05/05/2021    1:33 PM  PHQ 2/9 Scores  PHQ - 2 Score 0 0 0 0 0 1 1  PHQ- 9 Score    0  3 4    Fall Risk    08/25/2022    3:43 PM 05/12/2022    1:59 PM 02/09/2022    1:48 PM 05/11/2021    1:13 PM 05/05/2021    1:33 PM  [place] in the past year? 0 0 0 0 0  Number falls in past yr: 0 0 0  0  Injury with Fall? 0 0 0  0  Risk for fall due to : Orthopedic patient No Fall Risks No Fall Risks    Follow up Falls prevention discussed;Education provided Falls evaluation completed Falls evaluation completed      [place]:  Any stairs in or around the home? Yes  If so, are there any without handrails? No  Home free of loose throw rugs in walkways, pet beds, electrical cords, etc? Yes  Adequate lighting in your home to reduce risk of falls? Yes   ASSISTIVE DEVICES UTILIZED TO PREVENT FALLS:  Life alert? No  Use of a cane, walker or w/c? No  Grab bars in the bathroom? No  Shower chair or bench in shower? No   Elevated toilet seat or a handicapped toilet? No   TIMED UP AND GO:  Was the test performed? No . Telephonic visit  Cognitive Function:        08/25/2022    3:49 PM 05/11/2021    1:14 PM  6CIT Screen  What Year? 0 points 0 points  What month? 0 points 0 points  What time? 0 points 0 points  Count back from 20 0 points 0 points  Months in reverse 0 points 0 points  Repeat phrase 0 points 0 points  Total Score 0 points 0 points    Immunizations Immunization History  Administered Date(s) Administered   Influenza,inj,Quad PF,6+ Mos 07/03/2014   Pneumococcal Conjugate-13 01/01/2015   Tdap 08/26/2013    TDAP status: Up to date  Flu Vaccine status: Declined, Education has been provided regarding the importance of this vaccine but patient still declined. Advised may receive this vaccine at local pharmacy or Health Dept. Aware to provide a copy of the vaccination record if obtained from local pharmacy or Health Dept. Verbalized acceptance and understanding.  Pneumococcal vaccine status: Up to date  Covid-19 vaccine status: Declined, Education has been provided regarding the importance of this vaccine but patient still declined. Advised may receive this vaccine at local pharmacy or Health Dept.or vaccine clinic. Aware to provide a copy of the vaccination record if obtained from local pharmacy or Health Dept. Verbalized acceptance and understanding.  Qualifies for Shingles Vaccine? No    Screening Tests Health Maintenance  Topic Date Due   COVID-19 Vaccine (1) 08/27/2022 (Originally [birth year]1987)   Medicare Annual Wellness (AWV)  08/26/2023   DTaP/Tdap/Td (2 - Td or Tdap) 08/27/2023   Hepatitis C Screening  Completed   HIV Screening  Completed   HPV VACCINES  Aged Out    Health Maintenance  There are no preventive care reminders to display for this patient.  Colorectal cancer screening due at age 65  Lung Cancer Screening: (Low Dose CT Chest recommended if Age 70-80 years,  30 pack-year currently smoking OR have quit w/in 15years.) does not qualify.   Additional Screening:  Hepatitis C Screening: does qualify; Completed 02/09/2016  Vision Screening: Recommended annual ophthalmology exams for early detection of glaucoma and other disorders of the eye. Is the patient up to date with their annual eye exam?  No  Who is the provider or what is the name of the office in which the patient attends annual eye exams? none If pt is not established with a provider, would they like to be referred to a provider to establish care? No .   Dental Screening: Recommended annual dental exams for proper oral hygiene  Community Resource Referral / Chronic Care Management: CRR required this visit?  No   CCM required this visit?  No      Plan:     I have personally reviewed and noted the following in the patient's chart:   Medical and social history Use of alcohol, tobacco or illicit drugs  Current medications and supplements including opioid prescriptions. Patient is currently taking opioid prescriptions. Information provided to patient regarding non-opioid alternatives. Patient advised to discuss non-opioid treatment plan with their provider. Functional ability and status Nutritional status Physical activity Advanced directives List of other physicians Hospitalizations, surgeries, and ER visits in previous 12 months Vitals Screenings to include cognitive, depression, and falls Referrals and appointments  In addition, I have reviewed and discussed with patient certain preventive protocols, quality metrics, and best practice recommendations. A written personalized care plan for preventive services as well as general preventive health recommendations were provided to patient.     Sandrea Hammond, LPN   [id number]   Nurse Notes: none

## 2022-08-25 NOTE — Progress Notes (Deleted)
Subjective:   [name] is a 42 y.o. male who presents for Medicare Annual/Subsequent preventive examination.  Review of Systems    ***       Objective:    There were no vitals filed for this visit. There is no height or weight on file to calculate BMI.     10/28/2021    8:36 PM 05/11/2021    1:12 PM 04/27/2021   12:45 PM 02/02/2021   10:44 AM 02/01/2021    4:49 PM 12/23/2020    4:33 PM 12/23/2020    2:58 AM  Advanced Directives  Does Patient Have a Medical Advance Directive? No No No No No No No  Would patient like information on creating a medical advance directive? No - Patient declined No - Patient declined No - Patient declined No - Patient declined  No - Patient declined     Current Medications (verified) Outpatient Encounter Medications as of 08/25/2022  Medication Sig   cholecalciferol (VITAMIN D3) 25 MCG (1000 UNIT) tablet Take 1,000 Units by mouth daily. (Patient not taking: Reported on 95/18/8416)   folic acid (FOLVITE) 606 MCG tablet Take 400 mcg by mouth daily.   hydroxyurea (HYDREA) 500 MG capsule Take 500 mg BID PO. May take with food to minimize GI side effects.   oxyCODONE-acetaminophen (PERCOCET) 10-325 MG tablet Take 1 tablet by mouth every 6 (six) hours as needed for pain.   Vitamin D, Ergocalciferol, (DRISDOL) 1.25 MG (50000 UNIT) CAPS capsule Take 1 capsule (50,000 Units total) by mouth every 7 (seven) days.   No facility-administered encounter medications on file as of 08/25/2022.    Allergies (verified) Patient has no known allergies.   History: Past Medical History:  Diagnosis Date   Cancer ([place])    Sickle cell anemia ([facility])    Past Surgical History:  Procedure Laterality Date   CHOLECYSTECTOMY     Family History  Problem Relation Age of Onset   Sickle cell anemia Mother    Sickle cell trait Father    Sickle cell trait Sister    Sickle cell trait Brother    Cancer Maternal Grandmother    Social History   Socioeconomic History    Marital status: Single    Spouse name: Not on file   Number of children: Not on file   Years of education: Not on file   Highest education level: Associate degree: occupational, Hotel manager, or vocational program  Occupational History   Not on file  Tobacco Use   Smoking status: Never   Smokeless tobacco: Never  Vaping Use   Vaping Use: Never used  Substance and Sexual Activity   Alcohol use: No   Drug use: No   Sexual activity: Not Currently  Other Topics Concern   Not on file  Social History Narrative   Pt lives at home with girlfriend.   Social Determinants of Health   Financial Resource Strain: Low Risk  (05/11/2021)   Overall Financial Resource Strain (CARDIA)    Difficulty of Paying Living Expenses: Not very hard  Food Insecurity: No Food Insecurity (05/11/2021)   Hunger Vital Sign    Worried About Running Out of Food in the Last Year: Never true    Ran Out of Food in the Last Year: Never true  Transportation Needs: No Transportation Needs (05/11/2021)   PRAPARE - Hydrologist (Medical): No    Lack of Transportation (Non-Medical): No  Physical Activity: Insufficiently Active (05/11/2021)   Exercise Vital Sign  Days of Exercise per Week: 4 days    Minutes of Exercise per Session: 20 min  Stress: No Stress Concern Present (05/11/2021)   [place]    Feeling of Stress : Only a little  Social Connections: Socially Integrated (05/11/2021)   Social Connection and Isolation Panel [NHANES]    Frequency of Communication with Friends and Family: More than three times a week    Frequency of Social Gatherings with Friends and Family: More than three times a week    Attends Religious Services: More than 4 times per year    Active Member of [employer] or Organizations: Yes    Attends Music therapist: More than 4 times per year    Marital Status: Living with partner    Tobacco  Counseling Counseling given: Not Answered   Clinical Intake:                 Diabetic?***         Activities of Daily Living     No data to display          Patient Care Team: Fenton Foy, NP as PCP - General (Pulmonary Disease)  Indicate any recent Medical Services you may have received from other than [facility] providers in the past year (date may be approximate).     Assessment:   This is a routine wellness examination for Jojo.  Hearing/Vision screen No results found.  Dietary issues and exercise activities discussed:     Goals Addressed   None    Depression Screen    08/11/2022    1:51 PM 05/12/2022    1:59 PM 02/09/2022    1:49 PM 02/09/2022    1:48 PM 05/11/2021    1:23 PM 05/05/2021    1:33 PM 07/17/2020   11:18 AM  PHQ 2/9 Scores  PHQ - 2 Score 0 0 0 0 1 1 0  PHQ- 9 Score   0  3 4     Fall Risk    05/12/2022    1:59 PM 02/09/2022    1:48 PM 05/11/2021    1:13 PM 05/05/2021    1:33 PM 07/17/2020   11:18 AM  [place] in the past year? 0 0 0 0 1  Number falls in past yr: 0 0  0 1  Injury with Fall? 0 0  0 1  Risk for fall due to : No Fall Risks No Fall Risks     Follow up Falls evaluation completed Falls evaluation completed       [place]:  Any stairs in or around the home? {YES/NO:21197} If so, are there any without handrails? {YES/NO:21197} Home free of loose throw rugs in walkways, pet beds, electrical cords, etc? {YES/NO:21197} Adequate lighting in your home to reduce risk of falls? {YES/NO:21197}  ASSISTIVE DEVICES UTILIZED TO PREVENT FALLS:  Life alert? {YES/NO:21197} Use of a cane, walker or w/c? {YES/NO:21197} Grab bars in the bathroom? {YES/NO:21197} Shower chair or bench in shower? {YES/NO:21197} Elevated toilet seat or a handicapped toilet? {YES/NO:21197}  TIMED UP AND GO:  Was the test performed? {YES/NO:21197}.  Length of time to ambulate 10 feet: *** sec.    {Appearance of ALPF:7902409}  Cognitive Function:        05/11/2021    1:14 PM  6CIT Screen  What Year? 0 points  What month? 0 points  What time? 0 points  Count  back from 20 0 points  Months in reverse 0 points  Repeat phrase 0 points  Total Score 0 points    Immunizations Immunization History  Administered Date(s) Administered   Influenza,inj,Quad PF,6+ Mos 07/03/2014   Pneumococcal Conjugate-13 01/01/2015   Tdap 08/26/2013    {TDAP status:2101805}  {Flu Vaccine status:2101806}  {Pneumococcal vaccine status:2101807}  {Covid-19 vaccine status:2101808}  Qualifies for Shingles Vaccine? {YES/NO:21197}  Zostavax completed {YES/NO:21197}  {Shingrix Completed?:2101804}  Screening Tests Health Maintenance  Topic Date Due   Medicare Annual Wellness (AWV)  05/11/2022   COVID-19 Vaccine (1) 08/27/2022 (Originally 08/25/1985)   DTaP/Tdap/Td (2 - Td or Tdap) 08/27/2023   Hepatitis C Screening  Completed   HIV Screening  Completed   HPV VACCINES  Aged Out    Health Maintenance  Health Maintenance Due  Topic Date Due   Medicare Annual Wellness (AWV)  05/11/2022    {Colorectal cancer screening:2101809}  Lung Cancer Screening: (Low Dose CT Chest recommended if Age 98-80 years, 30 pack-year currently smoking OR have quit w/in 15years.) {DOES NOT does:27190::"does not"} qualify.   Lung Cancer Screening Referral: ***  Additional Screening:  Hepatitis C Screening: {DOES NOT does:27190::"does not"} qualify; Completed ***  Vision Screening: Recommended annual ophthalmology exams for early detection of glaucoma and other disorders of the eye. Is the patient up to date with their annual eye exam?  {YES/NO:21197} Who is the provider or what is the name of the office in which the patient attends annual eye exams? *** If pt is not established with a provider, would they like to be referred to a provider to establish care? {YES/NO:21197}.   Dental Screening: Recommended  annual dental exams for proper oral hygiene  Community Resource Referral / Chronic Care Management: CRR required this visit?  {YES/NO:21197}  CCM required this visit?  {YES/NO:21197}     Plan:     I have personally reviewed and noted the following in the patient's chart:   Medical and social history Use of alcohol, tobacco or illicit drugs  Current medications and supplements including opioid prescriptions. {Opioid Prescriptions:[phone]} Functional ability and status Nutritional status Physical activity Advanced directives List of other physicians Hospitalizations, surgeries, and ER visits in previous 12 months Vitals Screenings to include cognitive, depression, and falls Referrals and appointments  In addition, I have reviewed and discussed with patient certain preventive protocols, quality metrics, and best practice recommendations. A written personalized care plan for preventive services as well as general preventive health recommendations were provided to patient.     Sandrea Hammond, LPN   4/70/9628   Nurse Notes: ***

## 2022-08-25 NOTE — Patient Instructions (Addendum)
[name] , Thank you for taking time to come for your Medicare Wellness Visit. I appreciate your ongoing commitment to your health goals. Please review the following plan we discussed and let me know if I can assist you in the future.   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        This is a list of the screening recommended for you and due dates:  Health Maintenance  Topic Date Due   COVID-19 Vaccine (1) 08/27/2022*   Medicare Annual Wellness Visit  08/26/2023   DTaP/Tdap/Td vaccine (2 - Td or Tdap) 08/27/2023   Hepatitis C Screening: USPSTF Recommendation to screen - Ages 35-79 yo.  Completed   HIV Screening  Completed   HPV Vaccine  Aged Out  *Topic was postponed. The date shown is not the original due date.    Advanced directives: Advance directive discussed with you today. Even though you declined this today, please call our office should you change your mind, and we can give you the proper paperwork for you to fill out.   Conditions/risks identified: Aim for 30 minutes of exercise or brisk walking, 6-8 glasses of water, and 5 servings of fruits and vegetables each day.   Next appointment: Follow up in one year for your annual wellness visit   Preventive Care 40-64 Years, Male Preventive care refers to lifestyle choices and visits with your health care provider that can promote health and wellness. What does preventive care include? A yearly physical exam. This is also called an annual well check. Dental exams once or twice a year. Routine eye exams. Ask your health care provider how often you should have your eyes checked. Personal lifestyle choices, including: Daily care of your teeth and gums. Regular physical activity. Eating a healthy diet. Avoiding tobacco and drug use. Limiting alcohol use. Practicing safe sex. Taking low-dose aspirin every day starting at  age 50. What happens during an annual well check? The services and screenings done by your health care provider during your annual well check will depend on your age, overall health, lifestyle risk factors, and family history of disease. Counseling  Your health care provider may ask you questions about your: Alcohol use. Tobacco use. Drug use. Emotional well-being. Home and relationship well-being. Sexual activity. Eating habits. Work and work Statistician. Screening  You may have the following tests or measurements: Height, weight, and BMI. Blood pressure. Lipid and cholesterol levels. These may be checked every 5 years, or more frequently if you are over 13 years old. Skin check. Lung cancer screening. You may have this screening every year starting at age 68 if you have a 30-pack-year history of smoking and currently smoke or have quit within the past 15 years. Fecal occult blood test (FOBT) of the stool. You may have this test every year starting at age 79. Flexible sigmoidoscopy or colonoscopy. You may have a sigmoidoscopy every 5 years or a colonoscopy every 10 years starting at age 101. Prostate cancer screening. Recommendations will vary depending on your family history and other risks. Hepatitis C blood test. Hepatitis B blood test. Sexually transmitted disease (STD) testing. Diabetes screening. This is done by checking your blood sugar (glucose) after you have not eaten for a while (fasting). You may have this done every 1-3 years. Discuss your test results, treatment options, and if necessary, the need for  more tests with your health care provider. Vaccines  Your health care provider may recommend certain vaccines, such as: Influenza vaccine. This is recommended every year. Tetanus, diphtheria, and acellular pertussis (Tdap, Td) vaccine. You may need a Td booster every 10 years. Zoster vaccine. You may need this after age 25. Pneumococcal 13-valent conjugate (PCV13) vaccine.  You may need this if you have certain conditions and have not been vaccinated. Pneumococcal polysaccharide (PPSV23) vaccine. You may need one or two doses if you smoke cigarettes or if you have certain conditions. Talk to your health care provider about which screenings and vaccines you need and how often you need them. This information is not intended to replace advice given to you by your health care provider. Make sure you discuss any questions you have with your health care provider. Document Released: 08/28/2015 Document Revised: 04/20/2016 Document Reviewed: 06/02/2015 Elsevier Interactive Patient Education  2017 [place] Prevention in the Home Falls can cause injuries. They can happen to people of all ages. There are many things you can do to make your home safe and to help prevent falls. What can I do on the outside of my home? Regularly fix the edges of walkways and driveways and fix any cracks. Remove anything that might make you trip as you walk through a door, such as a raised step or threshold. Trim any bushes or trees on the path to your home. Use bright outdoor lighting. Clear any walking paths of anything that might make someone trip, such as rocks or tools. Regularly check to see if handrails are loose or broken. Make sure that both sides of any steps have handrails. Any raised decks and porches should have guardrails on the edges. Have any leaves, snow, or ice cleared regularly. Use sand or salt on walking paths during winter. Clean up any spills in your garage right away. This includes oil or grease spills. What can I do in the bathroom? Use night lights. Install grab bars by the toilet and in the tub and shower. Do not use towel bars as grab bars. Use non-skid mats or decals in the tub or shower. If you need to sit down in the shower, use a plastic, non-slip stool. Keep the floor dry. Clean up any water that spills on the floor as soon as it happens. Remove  soap buildup in the tub or shower regularly. Attach bath mats securely with double-sided non-slip rug tape. Do not have throw rugs and other things on the floor that can make you trip. What can I do in the bedroom? Use night lights. Make sure that you have a light by your bed that is easy to reach. Do not use any sheets or blankets that are too big for your bed. They should not hang down onto the floor. Have a firm chair that has side arms. You can use this for support while you get dressed. Do not have throw rugs and other things on the floor that can make you trip. What can I do in the kitchen? Clean up any spills right away. Avoid walking on wet floors. Keep items that you use a lot in easy-to-reach places. If you need to reach something above you, use a strong step stool that has a grab bar. Keep electrical cords out of the way. Do not use floor polish or wax that makes floors slippery. If you must use wax, use non-skid floor wax. Do not have throw rugs and other things on the  floor that can make you trip. What can I do with my stairs? Do not leave any items on the stairs. Make sure that there are handrails on both sides of the stairs and use them. Fix handrails that are broken or loose. Make sure that handrails are as long as the stairways. Check any carpeting to make sure that it is firmly attached to the stairs. Fix any carpet that is loose or worn. Avoid having throw rugs at the top or bottom of the stairs. If you do have throw rugs, attach them to the floor with carpet tape. Make sure that you have a light switch at the top of the stairs and the bottom of the stairs. If you do not have them, ask someone to add them for you. What else can I do to help prevent falls? Wear shoes that: Do not have high heels. Have rubber bottoms. Are comfortable and fit you well. Are closed at the toe. Do not wear sandals. If you use a stepladder: Make sure that it is fully opened. Do not climb a  closed stepladder. Make sure that both sides of the stepladder are locked into place. Ask someone to hold it for you, if possible. Clearly mark and make sure that you can see: Any grab bars or handrails. First and last steps. Where the edge of each step is. Use tools that help you move around (mobility aids) if they are needed. These include: Canes. Walkers. Scooters. Crutches. Turn on the lights when you go into a dark area. Replace any light bulbs as soon as they burn out. Set up your furniture so you have a clear path. Avoid moving your furniture around. If any of your floors are uneven, fix them. If there are any pets around you, be aware of where they are. Review your medicines with your doctor. Some medicines can make you feel dizzy. This can increase your chance of falling. Ask your doctor what other things that you can do to help prevent falls. This information is not intended to replace advice given to you by your health care provider. Make sure you discuss any questions you have with your health care provider. Document Released: 05/28/2009 Document Revised: 01/07/2016 Document Reviewed: 09/05/2014 Elsevier Interactive Patient Education  2017 Elsevier Inc.   Managing Pain Without Opioids Opioids are strong medicines used to treat moderate to severe pain. For some people, especially those who have long-term (chronic) pain, opioids may not be the best choice for pain management due to: Side effects like nausea, constipation, and sleepiness. The risk of addiction (opioid use disorder). The longer you take opioids, the greater your risk of addiction. Pain that lasts for more than 3 months is called chronic pain. Managing chronic pain usually requires more than one approach and is often provided by a team of health care providers working together (multidisciplinary approach). Pain management may be done at a pain management center or pain clinic. How to manage pain without the use of  opioids Use non-opioid medicines Non-opioid medicines for pain may include: Over-the-counter or prescription non-steroidal anti-inflammatory drugs (NSAIDs). These may be the first medicines used for pain. They work well for muscle and bone pain, and they reduce swelling. Acetaminophen. This over-the-counter medicine may work well for milder pain but not swelling. Antidepressants. These may be used to treat chronic pain. A certain type of antidepressant (tricyclics) is often used. These medicines are given in lower doses for pain than when used for depression. Anticonvulsants. These are usually  used to treat seizures but may also reduce nerve (neuropathic) pain. Muscle relaxants. These relieve pain caused by sudden muscle tightening (spasms). You may also use a pain medicine that is applied to the skin as a patch, cream, or gel (topical analgesic), such as a numbing medicine. These may cause fewer side effects than medicines taken by mouth. Do certain therapies as directed Some therapies can help with pain management. They include: Physical therapy. You will do exercises to gain strength and flexibility. A physical therapist may teach you exercises to move and stretch parts of your body that are weak, stiff, or painful. You can learn these exercises at physical therapy visits and practice them at home. Physical therapy may also involve: Massage. Heat wraps or applying heat or cold to affected areas. Electrical signals that interrupt pain signals (transcutaneous electrical nerve stimulation, TENS). Weak lasers that reduce pain and swelling (low-level laser therapy). Signals from your body that help you learn to regulate pain (biofeedback). Occupational therapy. This helps you to learn ways to function at home and work with less pain. Recreational therapy. This involves trying new activities or hobbies, such as a physical activity or drawing. Mental health therapy, including: Cognitive behavioral  therapy (CBT). This helps you learn coping skills for dealing with pain. Acceptance and commitment therapy (ACT) to change the way you think and react to pain. Relaxation therapies, including muscle relaxation exercises and mindfulness-based stress reduction. Pain management counseling. This may be individual, family, or group counseling.  Receive medical treatments Medical treatments for pain management include: Nerve block injections. These may include a pain blocker and anti-inflammatory medicines. You may have injections: Near the spine to relieve chronic back or neck pain. Into joints to relieve back or joint pain. Into nerve areas that supply a painful area to relieve body pain. Into muscles (trigger point injections) to relieve some painful muscle conditions. A medical device placed near your spine to help block pain signals and relieve nerve pain or chronic back pain (spinal cord stimulation device). Acupuncture. Follow these instructions at home Medicines Take over-the-counter and prescription medicines only as told by your health care provider. If you are taking pain medicine, ask your health care providers about possible side effects to watch out for. Do not drive or use heavy machinery while taking prescription opioid pain medicine. Lifestyle  Do not use drugs or alcohol to reduce pain. If you drink alcohol, limit how much you have to: 0-1 drink a day for women who are not pregnant. 0-2 drinks a day for men. Know how much alcohol is in a drink. In the [place] one drink equals one 12 oz bottle of beer (355 mL), one 5 oz glass of wine (148 mL), or one 1 oz glass of hard liquor (44 mL). Do not use any products that contain nicotine or tobacco. These products include cigarettes, chewing tobacco, and vaping devices, such as e-cigarettes. If you need help quitting, ask your health care provider. Eat a healthy diet and maintain a healthy weight. Poor diet and excess weight may make pain  worse. Eat foods that are high in fiber. These include fresh fruits and vegetables, whole grains, and beans. Limit foods that are high in fat and processed sugars, such as fried and sweet foods. Exercise regularly. Exercise lowers stress and may help relieve pain. Ask your health care provider what activities and exercises are safe for you. If your health care provider approves, join an exercise class that combines movement and stress reduction. Examples  include yoga and tai chi. Get enough sleep. Lack of sleep may make pain worse. Lower stress as much as possible. Practice stress reduction techniques as told by your therapist. General instructions Work with all your pain management providers to find the treatments that work best for you. You are an important member of your pain management team. There are many things you can do to reduce pain on your own. Consider joining an online or in-person support group for people who have chronic pain. Keep all follow-up visits. This is important. Where to find more information You can find more information about managing pain without opioids from: American Academy of Pain Medicine: painmed.[facility]: instituteforchronicpain.org American Chronic Pain Association: theacpa.org Contact a health care provider if: You have side effects from pain medicine. Your pain gets worse or does not get better with treatments or home therapy. You are struggling with anxiety or depression. Summary Many types of pain can be managed without opioids. Chronic pain may respond better to pain management without opioids. Pain is best managed when you and a team of health care providers work together. Pain management without opioids may include non-opioid medicines, medical treatments, physical therapy, mental health therapy, and lifestyle changes. Tell your health care providers if your pain gets worse or is not being managed well enough. This information  is not intended to replace advice given to you by your health care provider. Make sure you discuss any questions you have with your health care provider. Document Revised: 11/11/2020 Document Reviewed: 11/11/2020 Elsevier Patient Education  [place].

## 2022-09-01 NOTE — Telephone Encounter (Signed)
Caller & Relationship to patient:  MRN #  [id number]   Call Back Number:   Date of Last Office Visit: 08/12/2022     Date of Next Office Visit: 08/25/2022    Medication(s) to be Refilled: Percocet  Preferred Pharmacy:   ** Please notify patient to allow 48-72 hours to process** **Let patient know to contact pharmacy at the end of the day to make sure medication is ready. ** **If patient has not been seen in a year or longer, book an appointment **Advise to use MyChart for refill requests OR to contact their pharmacy

## 2022-09-05 ENCOUNTER — Telehealth: Payer: Self-pay | Admitting: Nurse Practitioner

## 2022-09-05 NOTE — Telephone Encounter (Signed)
Caller & Relationship to patient: pt  MRN #  [id number]   Call Back Number:   Date of Last Office Visit: 08/25/2022     Date of Next Office Visit: 11/10/2022    Medication(s) to be Refilled:   Preferred Pharmacy:   ** Please notify patient to allow 48-72 hours to process** **Let patient know to contact pharmacy at the end of the day to make sure medication is ready. ** **If patient has not been seen in a year or longer, book an appointment **Advise to use MyChart for refill requests OR to contact their pharmacy

## 2022-09-05 NOTE — Telephone Encounter (Signed)
Please send prescription for percocet to [facility]. Other pharmacy out of stock. Pt called this pharmacy to confirm they had it in stock

## 2022-09-06 ENCOUNTER — Other Ambulatory Visit: Payer: Self-pay | Admitting: Family Medicine

## 2022-09-06 ENCOUNTER — Other Ambulatory Visit (HOSPITAL_COMMUNITY): Payer: Self-pay

## 2022-09-06 DIAGNOSIS — D571 Sickle-cell disease without crisis: Secondary | ICD-10-CM

## 2022-09-06 MED ORDER — OXYCODONE-ACETAMINOPHEN 10-325 MG PO TABS
1.0000 | ORAL_TABLET | Freq: Four times a day (QID) | ORAL | 0 refills | Status: DC | PRN
  Filled 2022-09-06: qty 60, 15d supply, fill #0

## 2022-09-06 NOTE — Telephone Encounter (Signed)
Ca you please send it [place]

## 2022-09-06 NOTE — Progress Notes (Signed)
Reviewed PDMP substance reporting system prior to prescribing opiate medications. No inconsistencies noted.  Meds ordered this encounter  Medications   oxyCODONE-acetaminophen (PERCOCET) 10-325 MG tablet    Sig: Take 1 tablet by mouth every 6 (six) hours as needed for pain.    Dispense:  60 tablet    Refill:  0    Order Specific Question:   Supervising Provider    Answer:   JEGEDE, OLUGBEMIGA E [[id number]]   Rosita Guzzetta Moore Puanani Gene  APRN, MSN, FNP-C [facility] [address]  [place], [place] [address] [phone]  

## 2022-09-07 ENCOUNTER — Other Ambulatory Visit: Payer: Self-pay | Admitting: Nurse Practitioner

## 2022-09-07 ENCOUNTER — Other Ambulatory Visit (HOSPITAL_COMMUNITY): Payer: Self-pay

## 2022-09-07 DIAGNOSIS — D571 Sickle-cell disease without crisis: Secondary | ICD-10-CM

## 2022-09-14 DIAGNOSIS — J9601 Acute respiratory failure with hypoxia: Secondary | ICD-10-CM | POA: Diagnosis not present

## 2022-09-14 DIAGNOSIS — E871 Hypo-osmolality and hyponatremia: Secondary | ICD-10-CM | POA: Diagnosis not present

## 2022-09-14 DIAGNOSIS — J189 Pneumonia, unspecified organism: Secondary | ICD-10-CM | POA: Diagnosis not present

## 2022-09-14 DIAGNOSIS — D5700 Hb-SS disease with crisis, unspecified: Secondary | ICD-10-CM | POA: Diagnosis not present

## 2022-09-14 DIAGNOSIS — D5701 Hb-SS disease with acute chest syndrome: Secondary | ICD-10-CM | POA: Diagnosis not present

## 2022-09-14 DIAGNOSIS — D57219 Sickle-cell/Hb-C disease with crisis, unspecified: Secondary | ICD-10-CM | POA: Diagnosis not present

## 2022-09-14 DIAGNOSIS — Z86711 Personal history of pulmonary embolism: Secondary | ICD-10-CM | POA: Diagnosis not present

## 2022-09-14 DIAGNOSIS — D72829 Elevated white blood cell count, unspecified: Secondary | ICD-10-CM | POA: Diagnosis not present

## 2022-09-14 DIAGNOSIS — R918 Other nonspecific abnormal finding of lung field: Secondary | ICD-10-CM | POA: Diagnosis not present

## 2022-09-15 DIAGNOSIS — D5700 Hb-SS disease with crisis, unspecified: Secondary | ICD-10-CM | POA: Diagnosis not present

## 2022-09-16 DIAGNOSIS — D5700 Hb-SS disease with crisis, unspecified: Secondary | ICD-10-CM | POA: Diagnosis not present

## 2022-09-16 DIAGNOSIS — Z681 Body mass index (BMI) 19 or less, adult: Secondary | ICD-10-CM | POA: Diagnosis not present

## 2022-09-16 DIAGNOSIS — D5701 Hb-SS disease with acute chest syndrome: Secondary | ICD-10-CM | POA: Diagnosis not present

## 2022-09-16 DIAGNOSIS — Z9049 Acquired absence of other specified parts of digestive tract: Secondary | ICD-10-CM | POA: Diagnosis not present

## 2022-09-16 DIAGNOSIS — E861 Hypovolemia: Secondary | ICD-10-CM | POA: Diagnosis not present

## 2022-09-16 DIAGNOSIS — E43 Unspecified severe protein-calorie malnutrition: Secondary | ICD-10-CM | POA: Diagnosis not present

## 2022-09-16 DIAGNOSIS — Z1152 Encounter for screening for COVID-19: Secondary | ICD-10-CM | POA: Diagnosis not present

## 2022-09-16 DIAGNOSIS — D539 Nutritional anemia, unspecified: Secondary | ICD-10-CM | POA: Diagnosis not present

## 2022-09-16 DIAGNOSIS — J189 Pneumonia, unspecified organism: Secondary | ICD-10-CM | POA: Diagnosis not present

## 2022-09-16 DIAGNOSIS — E871 Hypo-osmolality and hyponatremia: Secondary | ICD-10-CM | POA: Diagnosis not present

## 2022-09-18 DIAGNOSIS — D5700 Hb-SS disease with crisis, unspecified: Secondary | ICD-10-CM | POA: Diagnosis not present

## 2022-09-18 DIAGNOSIS — J189 Pneumonia, unspecified organism: Secondary | ICD-10-CM | POA: Diagnosis not present

## 2022-09-19 DIAGNOSIS — D5700 Hb-SS disease with crisis, unspecified: Secondary | ICD-10-CM | POA: Diagnosis not present

## 2022-09-22 ENCOUNTER — Telehealth: Payer: Self-pay | Admitting: Nurse Practitioner

## 2022-09-22 NOTE — Telephone Encounter (Signed)
Caller & Relationship to patient:  MRN #  [id number]   Call Back Number:   Date of Last Office Visit: 09/05/2022     Date of Next Office Visit: 11/10/2022    Medication(s) to be Refilled: Percocet  [facility] [address] [place]  ** Please notify patient to allow 48-72 hours to process** **Let patient know to contact pharmacy at the end of the day to make sure medication is ready. ** **If patient has not been seen in a year or longer, book an appointment **Advise to use MyChart for refill requests OR to contact their pharmacy

## 2022-09-23 ENCOUNTER — Other Ambulatory Visit: Payer: Self-pay | Admitting: Family Medicine

## 2022-09-30 ENCOUNTER — Other Ambulatory Visit: Payer: Self-pay | Admitting: Internal Medicine

## 2022-09-30 ENCOUNTER — Telehealth: Payer: Self-pay | Admitting: Nurse Practitioner

## 2022-09-30 DIAGNOSIS — D571 Sickle-cell disease without crisis: Secondary | ICD-10-CM

## 2022-09-30 MED ORDER — OXYCODONE-ACETAMINOPHEN 10-325 MG PO TABS: 1.0000 | ORAL_TABLET | Freq: Four times a day (QID) | ORAL | 0 refills | Status: DC | PRN

## 2022-09-30 NOTE — Telephone Encounter (Signed)
[place] [address]

## 2022-10-01 ENCOUNTER — Other Ambulatory Visit: Payer: Self-pay | Admitting: Nurse Practitioner

## 2022-10-01 DIAGNOSIS — D571 Sickle-cell disease without crisis: Secondary | ICD-10-CM

## 2022-10-01 MED ORDER — OXYCODONE-ACETAMINOPHEN 10-325 MG PO TABS: 1.0000 | ORAL_TABLET | Freq: Four times a day (QID) | ORAL | 0 refills | Status: DC | PRN

## 2022-10-01 NOTE — Progress Notes (Signed)
Patient currently in [place], [place] not available at the location that the med was sent to previously , patient wants med sent to  [place], [place].  PDMP reviewed, med sent to new pharmacy above.

## 2022-10-05 ENCOUNTER — Other Ambulatory Visit: Payer: Self-pay | Admitting: Family Medicine

## 2022-10-05 ENCOUNTER — Other Ambulatory Visit (HOSPITAL_COMMUNITY): Payer: Self-pay

## 2022-10-05 ENCOUNTER — Other Ambulatory Visit: Payer: Self-pay | Admitting: Nurse Practitioner

## 2022-10-05 DIAGNOSIS — D571 Sickle-cell disease without crisis: Secondary | ICD-10-CM

## 2022-10-05 NOTE — Telephone Encounter (Signed)
Please send pts percocet to [facility]

## 2022-10-06 ENCOUNTER — Other Ambulatory Visit (HOSPITAL_COMMUNITY): Payer: Self-pay

## 2022-10-06 NOTE — Telephone Encounter (Signed)
Looks like this has already been sent to the pharmacy

## 2022-10-06 NOTE — Telephone Encounter (Signed)
Caller & Relationship to patient:  MRN #  [id number]   Call Back Number:   Date of Last Office Visit: 09/30/2022     Date of Next Office Visit: 11/10/2022    Medication(s) to be Refilled: Percocet  Preferred Pharmacy:   ** Please notify patient to allow 48-72 hours to process** **Let patient know to contact pharmacy at the end of the day to make sure medication is ready. ** **If patient has not been seen in a year or longer, book an appointment **Advise to use MyChart for refill requests OR to contact their pharmacy

## 2022-10-07 ENCOUNTER — Telehealth: Payer: Self-pay | Admitting: Nurse Practitioner

## 2022-10-07 ENCOUNTER — Other Ambulatory Visit: Payer: Self-pay | Admitting: Family Medicine

## 2022-10-07 ENCOUNTER — Other Ambulatory Visit (HOSPITAL_COMMUNITY): Payer: Self-pay

## 2022-10-07 ENCOUNTER — Other Ambulatory Visit: Payer: Self-pay | Admitting: Nurse Practitioner

## 2022-10-07 DIAGNOSIS — D571 Sickle-cell disease without crisis: Secondary | ICD-10-CM

## 2022-10-07 MED ORDER — OXYCODONE-ACETAMINOPHEN 10-325 MG PO TABS
1.0000 | ORAL_TABLET | Freq: Four times a day (QID) | ORAL | 0 refills | Status: DC | PRN
  Filled 2022-10-07: qty 60, 15d supply, fill #0

## 2022-10-07 MED ORDER — OXYCODONE-ACETAMINOPHEN 10-325 MG PO TABS: 1.0000 | ORAL_TABLET | Freq: Four times a day (QID) | ORAL | 0 refills | Status: DC | PRN

## 2022-10-07 NOTE — Progress Notes (Signed)
Reviewed PDMP substance reporting system prior to prescribing opiate medications. No inconsistencies noted.  Meds ordered this encounter  Medications   oxyCODONE-acetaminophen (PERCOCET) 10-325 MG tablet    Sig: Take 1 tablet by mouth every 6 (six) hours as needed for pain.    Dispense:  60 tablet    Refill:  0    Order Specific Question:   Supervising Provider    Answer:   Tresa Garter [id number]   Donia Pounds  APRN, MSN, FNP-C Patient [facility] [id number] [place], [place] [address] [phone]

## 2022-10-07 NOTE — Progress Notes (Signed)
Reviewed PDMP substance reporting system prior to prescribing opiate medications. No inconsistencies noted.  Meds ordered this encounter  Medications   oxyCODONE-acetaminophen (PERCOCET) 10-325 MG tablet    Sig: Take 1 tablet by mouth every 6 (six) hours as needed for pain.    Dispense:  60 tablet    Refill:  0    Order Specific Question:   Supervising Provider    Answer:   Tresa Garter [id number]   Donia Pounds  APRN, MSN, FNP-C Patient [facility] [address] [place], [place] [address] [phone]

## 2022-10-07 NOTE — Telephone Encounter (Signed)
Caller & Relationship to patient:  MRN #  [id number]   Call Back Number:   Date of Last Office Visit: 10/05/2022     Date of Next Office Visit: 11/10/2022    Medication(s) to be Refilled:   Preferred Pharmacy:   ** Please notify patient to allow 48-72 hours to process** **Let patient know to contact pharmacy at the end of the day to make sure medication is ready. ** **If patient has not been seen in a year or longer, book an appointment **Advise to use MyChart for refill requests OR to contact their pharmacy

## 2022-10-07 NOTE — Telephone Encounter (Signed)
Oxycodone ? ?

## 2022-10-17 ENCOUNTER — Telehealth: Payer: Self-pay | Admitting: Nurse Practitioner

## 2022-10-17 NOTE — Telephone Encounter (Signed)
Caller & Relationship to patient:  MRN #  [id number]   Call Back Number:   Date of Last Office Visit: 10/07/2022     Date of Next Office Visit: 11/10/2022    Medication(s) to be Refilled: Percocet   Preferred Pharmacy: [facility]  ** Please notify patient to allow 48-72 hours to process** **Let patient know to contact pharmacy at the end of the day to make sure medication is ready. ** **If patient has not been seen in a year or longer, book an appointment **Advise to use MyChart for refill requests OR to contact their pharmacy

## 2022-10-18 ENCOUNTER — Telehealth: Payer: Self-pay

## 2022-10-18 ENCOUNTER — Other Ambulatory Visit: Payer: Self-pay | Admitting: Family Medicine

## 2022-10-18 ENCOUNTER — Other Ambulatory Visit (HOSPITAL_COMMUNITY): Payer: Self-pay

## 2022-10-18 DIAGNOSIS — D571 Sickle-cell disease without crisis: Secondary | ICD-10-CM

## 2022-10-18 MED ORDER — OXYCODONE-ACETAMINOPHEN 10-325 MG PO TABS
1.0000 | ORAL_TABLET | Freq: Four times a day (QID) | ORAL | 0 refills | Status: DC | PRN
  Filled 2022-10-18 – 2022-10-21 (×2): qty 48, 12d supply, fill #0

## 2022-10-18 NOTE — Telephone Encounter (Signed)
Do to the pt  last few times  of issues with his refills. Pt is requesting to have this filled before he leaves to [place].  Please advise [facility]

## 2022-10-18 NOTE — Progress Notes (Signed)
Reviewed PDMP substance reporting system prior to prescribing opiate medications. No inconsistencies noted.  Meds ordered this encounter  Medications   oxyCODONE-acetaminophen (PERCOCET) 10-325 MG tablet    Sig: Take 1 tablet by mouth every 6 (six) hours as needed for pain.    Dispense:  48 tablet    Refill:  0    Order Specific Question:   Supervising Provider    Answer:   Tresa Garter [id number]   Donia Pounds  APRN, MSN, FNP-C Patient [facility] [address] [place], [place] [address] [phone]

## 2022-10-21 ENCOUNTER — Other Ambulatory Visit (HOSPITAL_COMMUNITY): Payer: Self-pay

## 2022-10-21 ENCOUNTER — Other Ambulatory Visit: Payer: Self-pay | Admitting: Nurse Practitioner

## 2022-11-08 ENCOUNTER — Telehealth: Payer: Self-pay

## 2022-11-08 NOTE — Telephone Encounter (Signed)
Caller & Relationship to patient:  MRN #  [id number]   Call Back Number: [phone]   Date of Last Office Visit: 10/18/2022     Date of Next Office Visit: 11/10/2022    Medication(s) to be Refilled: Percocet 10-325  Preferred Pharmacy: [facility]   ** Please notify patient to allow 48-72 hours to process** **Let patient know to contact pharmacy at the end of the day to make sure medication is ready. ** **If patient has not been seen in a year or longer, book an appointment **Advise to use MyChart for refill requests OR to contact their pharmacy

## 2022-11-09 ENCOUNTER — Other Ambulatory Visit (HOSPITAL_COMMUNITY): Payer: Self-pay

## 2022-11-09 ENCOUNTER — Other Ambulatory Visit: Payer: Self-pay | Admitting: Family Medicine

## 2022-11-09 DIAGNOSIS — D571 Sickle-cell disease without crisis: Secondary | ICD-10-CM

## 2022-11-09 MED ORDER — OXYCODONE-ACETAMINOPHEN 10-325 MG PO TABS
1.0000 | ORAL_TABLET | Freq: Four times a day (QID) | ORAL | 0 refills | Status: DC | PRN
  Filled 2022-11-09: qty 60, 15d supply, fill #0

## 2022-11-09 NOTE — Progress Notes (Signed)
Reviewed PDMP substance reporting system prior to prescribing opiate medications. No inconsistencies noted.   Meds ordered this encounter  Medications  . oxyCODONE-acetaminophen (PERCOCET) 10-325 MG tablet    Sig: Take 1 tablet by mouth every 6 (six) hours as needed for pain.    Dispense:  60 tablet    Refill:  0    Order Specific Question:   Supervising Provider    Answer:   JEGEDE, OLUGBEMIGA E [[id number]]     [name] Carducci Moore Kenith Trickel  APRN, MSN, FNP-C [facility] [address]  [place], [place] [address] [phone]  

## 2022-11-10 ENCOUNTER — Encounter: Payer: Self-pay | Admitting: Nurse Practitioner

## 2022-11-10 ENCOUNTER — Other Ambulatory Visit (HOSPITAL_COMMUNITY): Payer: Self-pay

## 2022-11-10 ENCOUNTER — Telehealth (INDEPENDENT_AMBULATORY_CARE_PROVIDER_SITE_OTHER): Payer: No Typology Code available for payment source | Admitting: Nurse Practitioner

## 2022-11-10 VITALS — Ht 78.0 in | Wt 148.0 lb

## 2022-11-10 DIAGNOSIS — D571 Sickle-cell disease without crisis: Secondary | ICD-10-CM | POA: Diagnosis not present

## 2022-11-10 NOTE — Progress Notes (Signed)
Virtual Visit via Video Note  I connected with [name] on 11/10/22 at  2:20 PM EDT by a video enabled telemedicine application and verified that I am speaking with the correct person using two identifiers.  Location: Patient: home Provider: Office   I discussed the limitations of evaluation and management by telemedicine and the availability of in person appointments. The patient expressed understanding and agreed to proceed.    History of Present Illness:  Patient presents today through a telephone visit for sickle cell follow-up.  Overall he states that he is doing really well.  We will have him come in next week for blood work.  We did discuss referral to [facility] hematology for gene therapy for sickle cell.  We will place referral for this today. Denies f/c/s, n/v/d, hemoptysis, PND, leg swelling Denies chest pain or edema       Observations/Objective:     11/10/2022    2:33 PM 08/25/2022    3:40 PM 08/11/2022    1:52 PM  Vitals with BMI  Height 6\' 6"  6\' 2"  6\' 1"   Weight 148 lbs 148 lbs 148 lbs 6 oz  BMI 17.11 123456 [id number]  Systolic   [id number]  Diastolic   62  Pulse   73      Assessment and Plan:  1. Hb-SS disease without crisis ([name])  - Sickle Cell Panel; Future - Ambulatory referral to Hematology / Oncology - [facility] for sickle cell gene therapy  Follow up:  Follow up in 3 months    I discussed the assessment and treatment plan with the patient. The patient was provided an opportunity to ask questions and all were answered. The patient agreed with the plan and demonstrated an understanding of the instructions.   The patient was advised to call back or seek an in-person evaluation if the symptoms worsen or if the condition fails to improve as anticipated.  I provided 23 minutes of non-face-to-face time during this encounter.   Fenton Foy, NP

## 2022-11-10 NOTE — Patient Instructions (Signed)
1. Hb-SS disease without crisis ([place])  - Sickle Cell Panel; Future - Ambulatory referral to Hematology / Oncology - [facility] for sickle cell gene therapy  Follow up:  Follow up in 3 months

## 2022-11-29 ENCOUNTER — Telehealth: Payer: Self-pay | Admitting: Nurse Practitioner

## 2022-11-29 NOTE — Telephone Encounter (Signed)
Caller & Relationship to patient:  MRN #  [id number]   Call Back Number:   Date of Last Office Visit: 11/08/2022     Date of Next Office Visit: Visit date not found    Medication(s) to be Refilled: Percocet  Preferred Pharmacy:   ** Please notify patient to allow 48-72 hours to process** **Let patient know to contact pharmacy at the end of the day to make sure medication is ready. ** **If patient has not been seen in a year or longer, book an appointment **Advise to use MyChart for refill requests OR to contact their pharmacy

## 2022-11-30 ENCOUNTER — Other Ambulatory Visit: Payer: Self-pay | Admitting: Nurse Practitioner

## 2022-11-30 DIAGNOSIS — D571 Sickle-cell disease without crisis: Secondary | ICD-10-CM

## 2022-11-30 MED ORDER — OXYCODONE-ACETAMINOPHEN 10-325 MG PO TABS: 1.0000 | ORAL_TABLET | Freq: Four times a day (QID) | ORAL | 0 refills | Status: DC | PRN

## 2022-12-02 ENCOUNTER — Non-Acute Institutional Stay (HOSPITAL_COMMUNITY)
Admission: AD | Admit: 2022-12-02 | Discharge: 2022-12-02 | Disposition: A | Discharge status: Home / Self Care | Payer: No Typology Code available for payment source | Source: Ambulatory Visit | Attending: Internal Medicine | Admitting: Internal Medicine

## 2022-12-02 ENCOUNTER — Other Ambulatory Visit (HOSPITAL_COMMUNITY): Payer: Self-pay

## 2022-12-02 ENCOUNTER — Telehealth (HOSPITAL_COMMUNITY): Payer: Self-pay

## 2022-12-02 DIAGNOSIS — G894 Chronic pain syndrome: Secondary | ICD-10-CM | POA: Insufficient documentation

## 2022-12-02 DIAGNOSIS — Z79891 Long term (current) use of opiate analgesic: Secondary | ICD-10-CM | POA: Insufficient documentation

## 2022-12-02 DIAGNOSIS — D5700 Hb-SS disease with crisis, unspecified: Secondary | ICD-10-CM | POA: Insufficient documentation

## 2022-12-02 DIAGNOSIS — D638 Anemia in other chronic diseases classified elsewhere: Secondary | ICD-10-CM | POA: Diagnosis not present

## 2022-12-02 LAB — CBC WITH DIFFERENTIAL/PLATELET
Abs Immature Granulocytes: 0.39 10*3/uL — ABNORMAL HIGH (ref 0.00–0.07)
Basophils Absolute: 0.1 10*3/uL (ref 0.0–0.1)
Basophils Relative: 1 %
Eosinophils Absolute: 0.5 10*3/uL (ref 0.0–0.5)
Eosinophils Relative: 4 %
HCT: 27.2 % — ABNORMAL LOW (ref 39.0–52.0)
Hemoglobin: 9.4 g/dL — ABNORMAL LOW (ref 13.0–17.0)
Immature Granulocytes: 3 %
Lymphocytes Relative: 34 %
Lymphs Abs: 4.6 10*3/uL — ABNORMAL HIGH (ref 0.7–4.0)
MCH: 29.9 pg (ref 26.0–34.0)
MCHC: 34.6 g/dL (ref 30.0–36.0)
MCV: 86.6 fL (ref 80.0–100.0)
Monocytes Absolute: 1 10*3/uL (ref 0.1–1.0)
Monocytes Relative: 8 %
Neutro Abs: 6.7 10*3/uL (ref 1.7–7.7)
Neutrophils Relative %: 50 %
Platelets: 434 10*3/uL — ABNORMAL HIGH (ref 150–400)
RBC: 3.14 MIL/uL — ABNORMAL LOW (ref 4.22–5.81)
RDW: 21.8 % — ABNORMAL HIGH (ref 11.5–15.5)
WBC: 13.3 10*3/uL — ABNORMAL HIGH (ref 4.0–10.5)
nRBC: 8.8 % — ABNORMAL HIGH (ref 0.0–0.2)

## 2022-12-02 LAB — RETICULOCYTES
Immature Retic Fract: 42.1 % — ABNORMAL HIGH (ref 2.3–15.9)
RBC.: 3.1 MIL/uL — ABNORMAL LOW (ref 4.22–5.81)
Retic Count, Absolute: 289.5 10*3/uL — ABNORMAL HIGH (ref 19.0–186.0)
Retic Ct Pct: 9.3 % — ABNORMAL HIGH (ref 0.4–3.1)

## 2022-12-02 MED ORDER — SENNOSIDES-DOCUSATE SODIUM 8.6-50 MG PO TABS
1.0000 | ORAL_TABLET | Freq: Two times a day (BID) | ORAL | Status: DC
  Administered 2022-12-02: 1 via ORAL
  Filled 2022-12-02: qty 1

## 2022-12-02 MED ORDER — SODIUM CHLORIDE 0.9% FLUSH: 9.0000 mL | INTRAVENOUS | Status: DC | PRN

## 2022-12-02 MED ORDER — POLYETHYLENE GLYCOL 3350 17 G PO PACK: 17.0000 g | PACK | Freq: Every day | ORAL | Status: DC | PRN

## 2022-12-02 MED ORDER — HYDROMORPHONE 1 MG/ML IV SOLN
INTRAVENOUS | Status: DC
  Administered 2022-12-02: 6.5 mg via INTRAVENOUS
  Administered 2022-12-02: 30 mg via INTRAVENOUS
  Filled 2022-12-02: qty 30

## 2022-12-02 MED ORDER — ACETAMINOPHEN 500 MG PO TABS
1000.0000 mg | ORAL_TABLET | Freq: Once | ORAL | Status: AC
  Administered 2022-12-02: 1000 mg via ORAL
  Filled 2022-12-02: qty 2

## 2022-12-02 MED ORDER — ONDANSETRON HCL 4 MG/2ML IJ SOLN: 4.0000 mg | Freq: Four times a day (QID) | INTRAMUSCULAR | Status: DC | PRN

## 2022-12-02 MED ORDER — KETOROLAC TROMETHAMINE 30 MG/ML IJ SOLN
15.0000 mg | Freq: Once | INTRAMUSCULAR | Status: AC
  Administered 2022-12-02: 15 mg via INTRAVENOUS
  Filled 2022-12-02: qty 1

## 2022-12-02 MED ORDER — DIPHENHYDRAMINE HCL 25 MG PO CAPS: 25.0000 mg | ORAL_CAPSULE | ORAL | Status: DC | PRN

## 2022-12-02 MED ORDER — NALOXONE HCL 0.4 MG/ML IJ SOLN: 0.4000 mg | INTRAMUSCULAR | Status: DC | PRN

## 2022-12-02 NOTE — H&P (Signed)
[facility] History and Physical   Date: 12/02/2022  Patient name: [name] Medical record number: [id number] Date of birth: [birth year] Age: 42 y.o. Gender: male PCP: Ivonne Andrew, NP  Attending physician: Quentin Angst, MD  Chief Complaint: Sickle cell pain   History of Present Illness: Hiram Mciver is a 42 year  Meds: Medications Prior to Admission  Medication Sig Dispense Refill Last Dose   cholecalciferol (VITAMIN D3) 25 MCG (1000 UNIT) tablet Take 1,000 Units by mouth daily. (Patient not taking: Reported on 08/25/2022)      folic acid (FOLVITE) 400 MCG tablet Take 400 mcg by mouth daily.      hydroxyurea (HYDREA) 500 MG capsule Take 500 mg BID PO. May take with food to minimize GI side effects. 360 capsule 1    oxyCODONE-acetaminophen (PERCOCET) 10-325 MG tablet Take 1 tablet by mouth every 6 (six) hours as needed for pain. 60 tablet 0    Vitamin D, Ergocalciferol, (DRISDOL) 1.25 MG (50000 UNIT) CAPS capsule Take 1 capsule (50,000 Units total) by mouth every 7 (seven) days. 4 capsule 0     Allergies: Patient has no known allergies. Past Medical History:  Diagnosis Date   Cancer (HCC)    Sickle cell anemia (HCC)    Past Surgical History:  Procedure Laterality Date   CHOLECYSTECTOMY     Family History  Problem Relation Age of Onset   Sickle cell anemia Mother    Sickle cell trait Father    Sickle cell trait Sister    Sickle cell trait Brother    Cancer Maternal Grandmother    Social History   Socioeconomic History   Marital status: Single    Spouse name: Not on file   Number of children: Not on file   Years of education: Not on file   Highest education level: Associate degree: occupational, Scientist, product/process development, or vocational program  Occupational History   Not on file  Tobacco Use   Smoking status: Never   Smokeless tobacco: Never  Vaping Use   Vaping Use: Never used  Substance and Sexual Activity   Alcohol use: No   Drug use: No    Sexual activity: Not Currently  Other Topics Concern   Not on file  Social History Narrative   Pt lives at home with girlfriend.   Social Determinants of Health   Financial Resource Strain: Low Risk  (08/25/2022)   Overall Financial Resource Strain (CARDIA)    Difficulty of Paying Living Expenses: Not hard at all  Food Insecurity: No Food Insecurity (08/25/2022)   Hunger Vital Sign    Worried About Running Out of Food in the Last Year: Never true    Ran Out of Food in the Last Year: Never true  Transportation Needs: No Transportation Needs (08/25/2022)   PRAPARE - Administrator, Civil Service (Medical): No    Lack of Transportation (Non-Medical): No  Physical Activity: Inactive (08/25/2022)   Exercise Vital Sign    Days of Exercise per Week: 0 days    Minutes of Exercise per Session: 0 min  Stress: No Stress Concern Present (08/25/2022)   [employer] of Occupational Health - Occupational Stress Questionnaire    Feeling of Stress : Not at all  Social Connections: Socially Integrated (08/25/2022)   Social Connection and Isolation Panel [NHANES]    Frequency of Communication with Friends and Family: More than three times a week    Frequency of Social Gatherings with Friends and Family: More  than three times a week    Attends Religious Services: 1 to 4 times per year    Active Member of Clubs or Organizations: No    Attends Banker Meetings: 1 to 4 times per year    Marital Status: Living with partner  Intimate Partner Violence: Not At Risk (08/25/2022)   Humiliation, Afraid, Rape, and Kick questionnaire    Fear of Current or Ex-Partner: No    Emotionally Abused: No    Physically Abused: No    Sexually Abused: No    Review of Systems: {Review Of Systems:30496}  Physical Exam: There were no vitals taken for this visit. {Exam, Complete:17964}  Lab results: No results found for this or any previous visit (from the past 24 hour(s)).  Imaging  results:  No results found.   Assessment & Plan: @  Julianne Handler 12/02/2022, 12:03 PM

## 2022-12-02 NOTE — Telephone Encounter (Signed)
Pt called day hospital wanting to come in today for sickle cell pain treatment. Pt reports 8/10 pain to back. Pt reports taking Percocet 10-325 at 930 AM. Pt denies being to ER recently. Pt screened for COVID and denies symptoms and exposures. Pt denies fever, chest pain, nausea, vomiting, diarrhea, and priapism. Pt endorses having some chest pain but says it is typical for crisis. [place] Hollis, FNP notified and she said if pt can get here by noon pt can come to day hospital. If pt cannot get here by noon or feels that he may need hospital admission he should go to the Emergency Room. Pt notified and verbalized understanding.

## 2022-12-02 NOTE — Progress Notes (Signed)
Patient admitted to the day infusion hospital for sickle cell pain. Initially, patient reported back pain rated 8/10. For pain management, patient placed on Sickle Cell Dose Dilaudid PCA, given IV Toradol, Tylenol and hydrated with IV fluids. At discharge, patient rated pain at 5/10. Vital signs stable. AVS offered but patient refused. Patient alert, oriented and ambulatory at discharge.

## 2022-12-19 ENCOUNTER — Telehealth: Payer: Self-pay | Admitting: Nurse Practitioner

## 2022-12-19 ENCOUNTER — Other Ambulatory Visit: Payer: Self-pay | Admitting: Nurse Practitioner

## 2022-12-19 DIAGNOSIS — D571 Sickle-cell disease without crisis: Secondary | ICD-10-CM

## 2022-12-19 MED ORDER — OXYCODONE-ACETAMINOPHEN 10-325 MG PO TABS
1.0000 | ORAL_TABLET | Freq: Four times a day (QID) | ORAL | 0 refills | Status: DC | PRN
Start: 2022-12-19 — End: 2023-02-01

## 2022-12-19 MED ORDER — HYDROXYUREA 500 MG PO CAPS: ORAL_CAPSULE | ORAL | 1 refills | Status: DC

## 2022-12-19 NOTE — Telephone Encounter (Signed)
Caller & Relationship to patient:  MRN #  [id number]   Call Back Number:   Date of Last Office Visit: 11/29/2022     Date of Next Office Visit: Visit date not found    Medication(s) to be Refilled: Percocet , Hydroxyurea   Preferred Pharmacy:   ** Please notify patient to allow 48-72 hours to process** **Let patient know to contact pharmacy at the end of the day to make sure medication is ready. ** **If patient has not been seen in a year or longer, book an appointment **Advise to use MyChart for refill requests OR to contact their pharmacy

## 2022-12-22 ENCOUNTER — Other Ambulatory Visit: Payer: Self-pay | Admitting: Family Medicine

## 2022-12-22 ENCOUNTER — Other Ambulatory Visit (HOSPITAL_COMMUNITY): Payer: Self-pay

## 2022-12-22 DIAGNOSIS — D571 Sickle-cell disease without crisis: Secondary | ICD-10-CM

## 2022-12-22 MED ORDER — OXYCODONE-ACETAMINOPHEN 10-325 MG PO TABS
1.0000 | ORAL_TABLET | Freq: Four times a day (QID) | ORAL | 0 refills | Status: DC | PRN
Start: 2022-12-22 — End: 2023-01-11
  Filled 2022-12-22: qty 60, 15d supply, fill #0

## 2022-12-22 NOTE — Telephone Encounter (Signed)
Please advise [facility] 

## 2023-01-11 ENCOUNTER — Other Ambulatory Visit (HOSPITAL_COMMUNITY): Payer: Self-pay

## 2023-01-11 ENCOUNTER — Other Ambulatory Visit: Payer: Self-pay | Admitting: Nurse Practitioner

## 2023-01-11 ENCOUNTER — Telehealth: Payer: Self-pay | Admitting: Nurse Practitioner

## 2023-01-11 DIAGNOSIS — D571 Sickle-cell disease without crisis: Secondary | ICD-10-CM

## 2023-01-11 MED ORDER — OXYCODONE-ACETAMINOPHEN 10-325 MG PO TABS
1.0000 | ORAL_TABLET | Freq: Four times a day (QID) | ORAL | 0 refills | Status: DC | PRN
Start: 2023-01-11 — End: 2023-02-01
  Filled 2023-01-11: qty 60, 15d supply, fill #0

## 2023-01-11 NOTE — Telephone Encounter (Signed)
Please advise [facility] 

## 2023-01-11 NOTE — Telephone Encounter (Signed)
Caller & Relationship to patient:  MRN #  [id number]   Call Back Number:   Date of Last Office Visit: 01/11/2023     Date of Next Office Visit: 02/01/2023    Medication(s) to be Refilled: Percocet  Preferred Pharmacy:   ** Please notify patient to allow 48-72 hours to process** **Let patient know to contact pharmacy at the end of the day to make sure medication is ready. ** **If patient has not been seen in a year or longer, book an appointment **Advise to use MyChart for refill requests OR to contact their pharmacy

## 2023-02-01 ENCOUNTER — Ambulatory Visit (INDEPENDENT_AMBULATORY_CARE_PROVIDER_SITE_OTHER): Payer: No Typology Code available for payment source | Admitting: Nurse Practitioner

## 2023-02-01 ENCOUNTER — Encounter: Payer: Self-pay | Admitting: Nurse Practitioner

## 2023-02-01 ENCOUNTER — Other Ambulatory Visit (HOSPITAL_COMMUNITY): Payer: Self-pay

## 2023-02-01 VITALS — BP 100/80 | HR 74 | Temp 97.4°F | Ht 73.0 in | Wt 147.0 lb

## 2023-02-01 DIAGNOSIS — D571 Sickle-cell disease without crisis: Secondary | ICD-10-CM | POA: Diagnosis not present

## 2023-02-01 DIAGNOSIS — E559 Vitamin D deficiency, unspecified: Secondary | ICD-10-CM | POA: Diagnosis not present

## 2023-02-01 DIAGNOSIS — Z5181 Encounter for therapeutic drug level monitoring: Secondary | ICD-10-CM | POA: Diagnosis not present

## 2023-02-01 MED ORDER — OXYCODONE-ACETAMINOPHEN 10-325 MG PO TABS
1.00 | ORAL_TABLET | Freq: Four times a day (QID) | ORAL | 0 refills | Status: DC | PRN
Start: 2023-02-01 — End: 2023-02-20
  Filled 2023-02-01: qty 60, 15d supply, fill #0

## 2023-02-01 MED ORDER — VITAMIN D (ERGOCALCIFEROL) 1.25 MG (50000 UNIT) PO CAPS
50000.0000 [IU] | ORAL_CAPSULE | ORAL | 0 refills | Status: DC
Start: 2023-02-01 — End: 2023-08-23
  Filled 2023-02-01: qty 4, 28d supply, fill #0

## 2023-02-01 MED ORDER — FOLIC ACID 400 MCG PO TABS
400.0000 ug | ORAL_TABLET | Freq: Every day | ORAL | 2 refills | Status: DC
  Filled 2023-02-01: qty 30, 30d supply, fill #0

## 2023-02-01 NOTE — Progress Notes (Signed)
@Patient  ID: [name], male    DOB: 1981-01-05, 42 y.o.   MRN: [id number]  Chief Complaint  Patient presents with   Sickle Cell Anemia    Follow up    Referring provider: Ivonne Andrew, NP   HPI   Patient presents today for follow-up on sickle cell.  Overall he has been doing well.  We will refill folic acid and vitamin D today.  He does need labs including drug screen today.  He states that he takes half a tablet of pain pill in the mornings and a half in the afternoons usually. Denies f/c/s, n/v/d, hemoptysis, PND, leg swelling Denies chest pain or edema      No Known Allergies  Immunization History  Administered Date(s) Administered   Influenza,inj,Quad PF,6+ Mos 07/03/2014   Pneumococcal Conjugate-13 01/01/2015   Tdap 08/26/2013    Past Medical History:  Diagnosis Date   Cancer (HCC)    Sickle cell anemia (HCC)     Tobacco History: Social History   Tobacco Use  Smoking Status Never  Smokeless Tobacco Never   Counseling given: Not Answered   Outpatient Encounter Medications as of 02/01/2023  Medication Sig   cholecalciferol (VITAMIN D3) 25 MCG (1000 UNIT) tablet Take 1,000 Units by mouth daily.   hydroxyurea (HYDREA) 500 MG capsule Take 500 mg BID PO. May take with food to minimize GI side effects.   [DISCONTINUED] oxyCODONE-acetaminophen (PERCOCET) 10-325 MG tablet Take 1 tablet by mouth every 6 (six) hours as needed for pain.   [DISCONTINUED] oxyCODONE-acetaminophen (PERCOCET) 10-325 MG tablet Take 1 tablet by mouth every 6 (six) hours as needed for pain.   folic acid (FOLVITE) 400 MCG tablet Take 1 tablet (400 mcg total) by mouth daily.   oxyCODONE-acetaminophen (PERCOCET) 10-325 MG tablet Take 1 tablet by mouth every 6 (six) hours as needed for pain.   Vitamin D, Ergocalciferol, (DRISDOL) 1.25 MG (50000 UNIT) CAPS capsule Take 1 capsule (50,000 Units total) by mouth every 7 (seven) days.   [DISCONTINUED] folic acid (FOLVITE) 400 MCG tablet Take  400 mcg by mouth daily. (Patient not taking: Reported on 02/01/2023)   [DISCONTINUED] Vitamin D, Ergocalciferol, (DRISDOL) 1.25 MG (50000 UNIT) CAPS capsule Take 1 capsule (50,000 Units total) by mouth every 7 (seven) days. (Patient not taking: Reported on 02/01/2023)   No facility-administered encounter medications on file as of 02/01/2023.     Review of Systems  Review of Systems  Constitutional: Negative.   HENT: Negative.    Cardiovascular: Negative.   Gastrointestinal: Negative.   Allergic/Immunologic: Negative.   Neurological: Negative.   Psychiatric/Behavioral: Negative.         Physical Exam  BP 100/80   Pulse 74   Temp (!) 97.4 F (36.3 C)   Ht 6\' 1"  (1.854 m)   Wt 147 lb (66.7 kg)   SpO2 99%   BMI 19.39 kg/m   Wt Readings from Last 5 Encounters:  02/01/23 147 lb (66.7 kg)  11/10/22 148 lb (67.1 kg)  08/25/22 148 lb (67.1 kg)  08/11/22 148 lb 6.4 oz (67.3 kg)  05/12/22 147 lb (66.7 kg)     Physical Exam Vitals and nursing note reviewed.  Constitutional:      General: He is not in acute distress.    Appearance: He is well-developed.  Cardiovascular:     Rate and Rhythm: Normal rate and regular rhythm.  Pulmonary:     Effort: Pulmonary effort is normal.     Breath sounds: Normal breath sounds.  Skin:    General: Skin is warm and dry.  Neurological:     Mental Status: He is alert and oriented to person, place, and time.      Lab Results:  CBC    Component Value Date/Time   WBC 13.3 (H) 12/02/2022 1230   RBC 3.14 (L) 12/02/2022 1230   RBC 3.10 (L) 12/02/2022 1230   HGB 9.4 (L) 12/02/2022 1230   HGB 8.9 (L) 08/11/2022 1450   HCT 27.2 (L) 12/02/2022 1230   HCT 25.5 (L) 08/11/2022 1450   PLT 434 (H) 12/02/2022 1230   PLT 388 08/11/2022 1450   MCV 86.6 12/02/2022 1230   MCV 89 08/11/2022 1450   MCH 29.9 12/02/2022 1230   MCHC 34.6 12/02/2022 1230   RDW 21.8 (H) 12/02/2022 1230   RDW 17.9 (H) 08/11/2022 1450   LYMPHSABS 4.6 (H) 12/02/2022  1230   LYMPHSABS 2.5 08/11/2022 1450   MONOABS 1.0 12/02/2022 1230   EOSABS 0.5 12/02/2022 1230   EOSABS 0.3 08/11/2022 1450   BASOSABS 0.1 12/02/2022 1230   BASOSABS 0.1 08/11/2022 1450    BMET    Component Value Date/Time   NA 137 08/11/2022 1450   K 4.7 08/11/2022 1450   CL 105 08/11/2022 1450   CO2 25 08/11/2022 1450   GLUCOSE 85 08/11/2022 1450   GLUCOSE 94 10/28/2021 2138   BUN 5 (L) 08/11/2022 1450   CREATININE 0.77 08/11/2022 1450   CREATININE 0.76 03/08/2017 1047   CALCIUM 9.3 08/11/2022 1450   GFRNONAA >60 10/28/2021 2138   GFRNONAA >89 03/08/2017 1047   GFRAA 135 04/17/2020 1412   GFRAA >89 03/08/2017 1047      Assessment & Plan:   Hb-SS disease without crisis (HCC) - Sickle Cell Panel - Drug Screen 11 w/Conf, Ser - oxyCODONE-acetaminophen (PERCOCET) 10-325 MG tablet; Take 1 tablet by mouth every 6 (six) hours as needed for pain.  Dispense: 60 tablet; Refill: 0  2. Vitamin D deficiency  - Vitamin D, Ergocalciferol, (DRISDOL) 1.25 MG (50000 UNIT) CAPS capsule; Take 1 capsule (50,000 Units total) by mouth every 7 (seven) days.  Dispense: 4 capsule; Refill: 0  Follow up:  Follow up in 1 month     Ivonne Andrew, NP 02/01/2023

## 2023-02-01 NOTE — Assessment & Plan Note (Signed)
-   Sickle Cell Panel - Drug Screen 11 w/Conf, Ser - oxyCODONE-acetaminophen (PERCOCET) 10-325 MG tablet; Take 1 tablet by mouth every 6 (six) hours as needed for pain.  Dispense: 60 tablet; Refill: 0  2. Vitamin D deficiency  - Vitamin D, Ergocalciferol, (DRISDOL) 1.25 MG (50000 UNIT) CAPS capsule; Take 1 capsule (50,000 Units total) by mouth every 7 (seven) days.  Dispense: 4 capsule; Refill: 0  Follow up:  Follow up in 1 month

## 2023-02-01 NOTE — Patient Instructions (Addendum)
1. Hb-SS disease without crisis (HCC)  - Sickle Cell Panel - Drug Screen 11 w/Conf, Ser - oxyCODONE-acetaminophen (PERCOCET) 10-325 MG tablet; Take 1 tablet by mouth every 6 (six) hours as needed for pain.  Dispense: 60 tablet; Refill: 0  2. Vitamin D deficiency  - Vitamin D, Ergocalciferol, (DRISDOL) 1.25 MG (50000 UNIT) CAPS capsule; Take 1 capsule (50,000 Units total) by mouth every 7 (seven) days.  Dispense: 4 capsule; Refill: 0  Follow up:  Follow up in 1 month

## 2023-02-02 ENCOUNTER — Other Ambulatory Visit (HOSPITAL_COMMUNITY): Payer: Self-pay

## 2023-02-02 LAB — DRUG SCREEN 11 W/CONF, SE

## 2023-02-02 LAB — CMP14+CBC/D/PLT+FER+RETIC+V...
AST: 37 IU/L (ref 0–40)
Basos: 1 %
CO2: 22 mmol/L (ref 20–29)
MCH: 28.9 pg (ref 26.6–33.0)
MCV: 87 fL (ref 79–97)
NRBC: 2 % — ABNORMAL HIGH (ref 0–0)
Potassium: 4.5 mmol/L (ref 3.5–5.2)
RBC: 3.05 x10E6/uL — ABNORMAL LOW (ref 4.14–5.80)

## 2023-02-05 LAB — CMP14+CBC/D/PLT+FER+RETIC+V...
BUN: 10 mg/dL (ref 6–24)
Chloride: 105 mmol/L (ref 96–106)
Hematocrit: 26.5 % — ABNORMAL LOW (ref 37.5–51.0)
Lymphs: 41 %
MCHC: 33.2 g/dL (ref 31.5–35.7)
Vit D, 25-Hydroxy: 10.5 ng/mL — ABNORMAL LOW (ref 30.0–100.0)
eGFR: 114 mL/min/{1.73_m2} (ref >59)

## 2023-02-05 LAB — DRUG SCREEN 11 W/CONF, SE: Methadone, IA: NEGATIVE ng/mL

## 2023-02-09 ENCOUNTER — Other Ambulatory Visit (HOSPITAL_COMMUNITY): Payer: Self-pay

## 2023-02-13 LAB — CMP14+CBC/D/PLT+FER+RETIC+V...
ALT: 20 IU/L (ref 0–44)
Albumin: 4.6 g/dL (ref 4.1–5.1)
Alkaline Phosphatase: 76 IU/L (ref 44–121)
BUN/Creatinine Ratio: 12 (ref 9–20)
Basophils Absolute: 0.1 10*3/uL (ref 0.0–0.2)
Bilirubin Total: 2.8 mg/dL — ABNORMAL HIGH (ref 0.0–1.2)
Calcium: 9.4 mg/dL (ref 8.7–10.2)
Creatinine, Ser: 0.82 mg/dL (ref 0.76–1.27)
EOS (ABSOLUTE): 0.3 10*3/uL (ref 0.0–0.4)
Eos: 4 %
Ferritin: 279 ng/mL (ref 30–400)
Globulin, Total: 3.1 g/dL (ref 1.5–4.5)
Glucose: 88 mg/dL (ref 70–99)
Hemoglobin: 8.8 g/dL — ABNORMAL LOW (ref 13.0–17.7)
Immature Grans (Abs): 0 10*3/uL (ref 0.0–0.1)
Immature Granulocytes: 0 %
Lymphocytes Absolute: 2.9 10*3/uL (ref 0.7–3.1)
Monocytes Absolute: 0.7 10*3/uL (ref 0.1–0.9)
Monocytes: 10 %
Neutrophils Absolute: 3.1 10*3/uL (ref 1.4–7.0)
Neutrophils: 44 %
Platelets: 600 10*3/uL — ABNORMAL HIGH (ref 150–450)
RDW: 19.2 % — ABNORMAL HIGH (ref 11.6–15.4)
Retic Ct Pct: 8.3 % — ABNORMAL HIGH (ref 0.6–2.6)
Sodium: 140 mmol/L (ref 134–144)
Total Protein: 7.7 g/dL (ref 6.0–8.5)
WBC: 7.1 10*3/uL (ref 3.4–10.8)

## 2023-02-13 LAB — OXYCODONES,MS,WB/SP RFX
Oxycocone: 4.9 ng/mL
Oxycodones Confirmation: POSITIVE
Oxymorphone: NEGATIVE ng/mL

## 2023-02-13 LAB — DRUG SCREEN 11 W/CONF, SE
Amphetamines, IA: NEGATIVE ng/mL
Barbiturates, IA: NEGATIVE ug/mL
Benzodiazepines, IA: NEGATIVE ng/mL
Cocaine & Metabolite, IA: NEGATIVE ng/mL
Opiates, IA: NEGATIVE ng/mL
Oxycodones, IA: POSITIVE ng/mL — AB
Propoxyphene, IA: NEGATIVE ng/mL
THC(Marijuana) Metabolite, IA: NEGATIVE ng/mL

## 2023-02-20 ENCOUNTER — Other Ambulatory Visit: Payer: Self-pay | Admitting: Nurse Practitioner

## 2023-02-20 ENCOUNTER — Other Ambulatory Visit (HOSPITAL_COMMUNITY): Payer: Self-pay

## 2023-02-20 DIAGNOSIS — D571 Sickle-cell disease without crisis: Secondary | ICD-10-CM

## 2023-02-20 MED ORDER — OXYCODONE-ACETAMINOPHEN 10-325 MG PO TABS
1.0000 | ORAL_TABLET | Freq: Four times a day (QID) | ORAL | 0 refills | Status: DC | PRN
Start: 2023-02-20 — End: 2023-03-06
  Filled 2023-02-20: qty 60, 15d supply, fill #0

## 2023-02-21 NOTE — Telephone Encounter (Signed)
Caller & Relationship to patient:  MRN #  [id number]   Call Back Number:   Date of Last Office Visit: 01/11/2023     Date of Next Office Visit: 02/01/2023    Medication(s) to be Refilled: Percocet  Preferred Pharmacy:   ** Please notify patient to allow 48-72 hours to process** **Let patient know to contact pharmacy at the end of the day to make sure medication is ready. ** **If patient has not been seen in a year or longer, book an appointment **Advise to use MyChart for refill requests OR to contact their pharmacy    

## 2023-02-22 ENCOUNTER — Other Ambulatory Visit: Payer: Self-pay | Admitting: Nurse Practitioner

## 2023-03-06 ENCOUNTER — Other Ambulatory Visit: Payer: Self-pay | Admitting: Nurse Practitioner

## 2023-03-06 ENCOUNTER — Other Ambulatory Visit (HOSPITAL_COMMUNITY): Payer: Self-pay

## 2023-03-06 ENCOUNTER — Ambulatory Visit: Payer: Self-pay | Admitting: Nurse Practitioner

## 2023-03-06 DIAGNOSIS — D571 Sickle-cell disease without crisis: Secondary | ICD-10-CM

## 2023-03-06 MED ORDER — OXYCODONE-ACETAMINOPHEN 10-325 MG PO TABS
1.0000 | ORAL_TABLET | Freq: Four times a day (QID) | ORAL | 0 refills | Status: DC | PRN
Start: 2023-03-06 — End: 2023-03-21
  Filled 2023-03-06: qty 60, 15d supply, fill #0

## 2023-03-21 ENCOUNTER — Other Ambulatory Visit (HOSPITAL_COMMUNITY): Payer: Self-pay

## 2023-03-21 ENCOUNTER — Other Ambulatory Visit: Payer: Self-pay | Admitting: Nurse Practitioner

## 2023-03-21 DIAGNOSIS — D571 Sickle-cell disease without crisis: Secondary | ICD-10-CM

## 2023-03-21 NOTE — Telephone Encounter (Signed)
Please advise [facility] 

## 2023-03-22 ENCOUNTER — Other Ambulatory Visit (HOSPITAL_COMMUNITY): Payer: Self-pay

## 2023-03-22 MED ORDER — OXYCODONE-ACETAMINOPHEN 10-325 MG PO TABS
1.0000 | ORAL_TABLET | Freq: Four times a day (QID) | ORAL | 0 refills | Status: DC | PRN
Start: 2023-03-22 — End: 2023-04-11
  Filled 2023-03-22: qty 60, 15d supply, fill #0

## 2023-03-24 ENCOUNTER — Other Ambulatory Visit (HOSPITAL_COMMUNITY): Payer: Self-pay

## 2023-04-11 ENCOUNTER — Other Ambulatory Visit: Payer: Self-pay | Admitting: Nurse Practitioner

## 2023-04-11 ENCOUNTER — Other Ambulatory Visit (HOSPITAL_COMMUNITY): Payer: Self-pay

## 2023-04-11 DIAGNOSIS — D571 Sickle-cell disease without crisis: Secondary | ICD-10-CM

## 2023-04-12 ENCOUNTER — Other Ambulatory Visit (HOSPITAL_COMMUNITY): Payer: Self-pay

## 2023-04-12 MED ORDER — HYDROXYUREA 500 MG PO CAPS
500.0000 mg | ORAL_CAPSULE | Freq: Two times a day (BID) | ORAL | 1 refills | Status: DC
Start: 2023-04-12 — End: 2024-06-10
  Filled 2023-04-12: qty 200, 100d supply, fill #0
  Filled 2023-10-06: qty 200, 100d supply, fill #1
  Filled 2024-03-08: qty 200, 100d supply, fill #2

## 2023-04-12 MED ORDER — OXYCODONE-ACETAMINOPHEN 10-325 MG PO TABS
1.0000 | ORAL_TABLET | Freq: Four times a day (QID) | ORAL | 0 refills | Status: DC | PRN
Start: 2023-04-12 — End: 2023-04-28
  Filled 2023-04-12: qty 60, 15d supply, fill #0

## 2023-04-13 ENCOUNTER — Other Ambulatory Visit (HOSPITAL_COMMUNITY): Payer: Self-pay

## 2023-04-28 ENCOUNTER — Other Ambulatory Visit: Payer: Self-pay | Admitting: Nurse Practitioner

## 2023-04-28 ENCOUNTER — Other Ambulatory Visit (HOSPITAL_COMMUNITY): Payer: Self-pay

## 2023-04-28 DIAGNOSIS — D571 Sickle-cell disease without crisis: Secondary | ICD-10-CM

## 2023-05-01 ENCOUNTER — Other Ambulatory Visit (HOSPITAL_COMMUNITY): Payer: Self-pay

## 2023-05-01 MED ORDER — OXYCODONE-ACETAMINOPHEN 10-325 MG PO TABS
1.0000 | ORAL_TABLET | Freq: Four times a day (QID) | ORAL | 0 refills | Status: DC | PRN
Start: 2023-05-01 — End: 2023-05-18
  Filled 2023-05-01: qty 60, 15d supply, fill #0

## 2023-05-02 ENCOUNTER — Other Ambulatory Visit (HOSPITAL_COMMUNITY): Payer: Self-pay

## 2023-05-18 ENCOUNTER — Other Ambulatory Visit (HOSPITAL_COMMUNITY): Payer: Self-pay

## 2023-05-18 ENCOUNTER — Other Ambulatory Visit: Payer: Self-pay | Admitting: Nurse Practitioner

## 2023-05-18 DIAGNOSIS — D571 Sickle-cell disease without crisis: Secondary | ICD-10-CM

## 2023-05-19 ENCOUNTER — Other Ambulatory Visit (HOSPITAL_COMMUNITY): Payer: Self-pay

## 2023-05-19 MED ORDER — OXYCODONE-ACETAMINOPHEN 10-325 MG PO TABS
1.0000 | ORAL_TABLET | Freq: Four times a day (QID) | ORAL | 0 refills | Status: DC | PRN
Start: 2023-05-19 — End: 2023-06-12
  Filled 2023-05-19: qty 60, 15d supply, fill #0

## 2023-05-22 ENCOUNTER — Other Ambulatory Visit (HOSPITAL_COMMUNITY): Payer: Self-pay

## 2023-06-12 ENCOUNTER — Other Ambulatory Visit: Payer: Self-pay | Admitting: Nurse Practitioner

## 2023-06-12 ENCOUNTER — Other Ambulatory Visit (HOSPITAL_COMMUNITY): Payer: Self-pay

## 2023-06-12 DIAGNOSIS — D571 Sickle-cell disease without crisis: Secondary | ICD-10-CM

## 2023-06-12 MED ORDER — OXYCODONE-ACETAMINOPHEN 10-325 MG PO TABS
1.0000 | ORAL_TABLET | Freq: Four times a day (QID) | ORAL | 0 refills | Status: DC | PRN
Start: 2023-06-12 — End: 2023-06-26
  Filled 2023-06-12: qty 60, 15d supply, fill #0

## 2023-06-13 ENCOUNTER — Other Ambulatory Visit (HOSPITAL_COMMUNITY): Payer: Self-pay

## 2023-06-14 ENCOUNTER — Other Ambulatory Visit (HOSPITAL_COMMUNITY): Payer: Self-pay

## 2023-06-26 ENCOUNTER — Other Ambulatory Visit (HOSPITAL_COMMUNITY): Payer: Self-pay

## 2023-06-26 ENCOUNTER — Other Ambulatory Visit: Payer: Self-pay | Admitting: Nurse Practitioner

## 2023-06-26 DIAGNOSIS — D571 Sickle-cell disease without crisis: Secondary | ICD-10-CM

## 2023-06-26 MED ORDER — OXYCODONE-ACETAMINOPHEN 10-325 MG PO TABS
1.0000 | ORAL_TABLET | Freq: Four times a day (QID) | ORAL | 0 refills | Status: DC | PRN
Start: 2023-06-26 — End: 2023-07-17
  Filled 2023-06-26: qty 60, 15d supply, fill #0

## 2023-06-27 ENCOUNTER — Other Ambulatory Visit: Payer: Self-pay

## 2023-06-27 ENCOUNTER — Other Ambulatory Visit (HOSPITAL_COMMUNITY): Payer: Self-pay

## 2023-06-28 ENCOUNTER — Other Ambulatory Visit (HOSPITAL_COMMUNITY): Payer: Self-pay

## 2023-07-17 ENCOUNTER — Other Ambulatory Visit (HOSPITAL_COMMUNITY): Payer: Self-pay

## 2023-07-17 ENCOUNTER — Other Ambulatory Visit: Payer: Self-pay | Admitting: Nurse Practitioner

## 2023-07-17 DIAGNOSIS — D571 Sickle-cell disease without crisis: Secondary | ICD-10-CM

## 2023-07-17 MED ORDER — OXYCODONE-ACETAMINOPHEN 10-325 MG PO TABS
1.0000 | ORAL_TABLET | Freq: Four times a day (QID) | ORAL | 0 refills | Status: DC | PRN
Start: 2023-07-17 — End: 2023-08-01
  Filled 2023-07-17: qty 60, 15d supply, fill #0

## 2023-08-01 ENCOUNTER — Other Ambulatory Visit: Payer: Self-pay | Admitting: Nurse Practitioner

## 2023-08-01 ENCOUNTER — Other Ambulatory Visit (HOSPITAL_COMMUNITY): Payer: Self-pay

## 2023-08-01 DIAGNOSIS — D571 Sickle-cell disease without crisis: Secondary | ICD-10-CM

## 2023-08-01 MED ORDER — OXYCODONE-ACETAMINOPHEN 10-325 MG PO TABS
1.0000 | ORAL_TABLET | Freq: Four times a day (QID) | ORAL | 0 refills | Status: DC | PRN
Start: 2023-08-01 — End: 2023-08-17
  Filled 2023-08-01: qty 60, 15d supply, fill #0

## 2023-08-02 ENCOUNTER — Other Ambulatory Visit (HOSPITAL_COMMUNITY): Payer: Self-pay

## 2023-08-17 ENCOUNTER — Other Ambulatory Visit: Payer: Self-pay | Admitting: Nurse Practitioner

## 2023-08-17 ENCOUNTER — Other Ambulatory Visit (HOSPITAL_COMMUNITY): Payer: Self-pay

## 2023-08-17 ENCOUNTER — Telehealth: Payer: Self-pay | Admitting: Nurse Practitioner

## 2023-08-17 DIAGNOSIS — D571 Sickle-cell disease without crisis: Secondary | ICD-10-CM

## 2023-08-17 MED ORDER — OXYCODONE-ACETAMINOPHEN 10-325 MG PO TABS
1.0000 | ORAL_TABLET | Freq: Four times a day (QID) | ORAL | 0 refills | Status: DC | PRN
Start: 2023-08-17 — End: 2023-08-29
  Filled 2023-08-17: qty 60, 15d supply, fill #0

## 2023-08-17 NOTE — Telephone Encounter (Signed)
 Patient is overdue for a follow up appointment. Will need appointment within the next 2 weeks to continue getting pain medication refills.

## 2023-08-17 NOTE — Telephone Encounter (Signed)
 Pt was called and lvm for call back to make an appt. Kh

## 2023-08-22 ENCOUNTER — Ambulatory Visit (INDEPENDENT_AMBULATORY_CARE_PROVIDER_SITE_OTHER): Payer: No Typology Code available for payment source | Admitting: Nurse Practitioner

## 2023-08-22 ENCOUNTER — Encounter: Payer: Self-pay | Admitting: Nurse Practitioner

## 2023-08-22 VITALS — BP 102/64 | HR 78 | Temp 97.2°F | Wt 149.0 lb

## 2023-08-22 DIAGNOSIS — Z5181 Encounter for therapeutic drug level monitoring: Secondary | ICD-10-CM | POA: Diagnosis not present

## 2023-08-22 DIAGNOSIS — E559 Vitamin D deficiency, unspecified: Secondary | ICD-10-CM | POA: Diagnosis not present

## 2023-08-22 DIAGNOSIS — D571 Sickle-cell disease without crisis: Secondary | ICD-10-CM | POA: Diagnosis not present

## 2023-08-22 NOTE — Progress Notes (Signed)
   Subjective   Patient ID: [name], male    DOB: [birth year], 43 y.o.   MRN: [id number]  Chief Complaint  Patient presents with   Follow-up    Referring provider: Oley Bascom RAMAN, NP  [name] is a 43 y.o. male with Past Medical History: No date: Cancer [facility]) No date: Sickle cell anemia (HCC)   HPI  Patient presents today for follow-up on sickle cell.  Overall he has been doing well.  We will refill folic acid  and vitamin D  today.  He does need labs including drug screen today.  He states that he takes half a tablet of pain pill in the mornings and a half in the afternoons usually. Denies f/c/s, n/v/d, hemoptysis, PND, leg swelling. Denies chest pain or edema.     No Known Allergies  Immunization History  Administered Date(s) Administered   Influenza,inj,Quad PF,6+ Mos 07/03/2014   Pneumococcal Conjugate-13 01/01/2015   Tdap 08/26/2013    Tobacco History: Social History   Tobacco Use  Smoking Status Never  Smokeless Tobacco Never   Counseling given: Not Answered   Outpatient Encounter Medications as of 08/22/2023  Medication Sig   cholecalciferol  (VITAMIN D3) 25 MCG (1000 UNIT) tablet Take 1,000 Units by mouth daily.   folic acid  (FOLVITE ) 400 MCG tablet Take 1 tablet (400 mcg total) by mouth daily.   hydroxyurea  (HYDREA ) 500 MG capsule Take 1 capsule (500 mg total) by mouth 2 (two) times daily. May take with food to minimize GI effects.   oxyCODONE -acetaminophen  (PERCOCET) 10-325 MG tablet Take 1 tablet by mouth every 6 hours as needed for pain.   Vitamin D , Ergocalciferol , (DRISDOL ) 1.25 MG (50000 UNIT) CAPS capsule Take 1 capsule (50,000 Units total) by mouth every 7 (seven) days. (Patient not taking: Reported on 08/22/2023)   No facility-administered encounter medications on file as of 08/22/2023.    Review of Systems  Review of Systems  Constitutional: Negative.   HENT: Negative.    Cardiovascular: Negative.   Gastrointestinal: Negative.    Allergic/Immunologic: Negative.   Neurological: Negative.   Psychiatric/Behavioral: Negative.       Objective:   BP 102/64   Pulse 78   Temp (!) 97.2 F (36.2 C)   Wt 149 lb (67.6 kg)   SpO2 100%   BMI 19.66 kg/m   Wt Readings from Last 5 Encounters:  08/22/23 149 lb (67.6 kg)  02/01/23 147 lb (66.7 kg)  11/10/22 148 lb (67.1 kg)  08/25/22 148 lb (67.1 kg)  08/11/22 148 lb 6.4 oz (67.3 kg)     Physical Exam Vitals and nursing note reviewed.  Constitutional:      General: He is not in acute distress.    Appearance: He is well-developed.  Cardiovascular:     Rate and Rhythm: Normal rate and regular rhythm.  Pulmonary:     Effort: Pulmonary effort is normal.     Breath sounds: Normal breath sounds.  Skin:    General: Skin is warm and dry.  Neurological:     Mental Status: He is alert and oriented to person, place, and time.       Assessment & Plan:   Sickle cell disease without crisis (HCC) -     Sickle Cell Panel -     Drug Screen 11 w/Conf, Ser     Return in about 3 months (around 11/20/2023).   Bascom RAMAN Oley, NP 08/22/2023

## 2023-08-22 NOTE — Patient Instructions (Addendum)
 1. Sickle cell disease without crisis (HCC) (Primary)  - Sickle Cell Panel - Drug Screen 11 w/Conf, Ser   Follow up:  Follow up in 3 months

## 2023-08-23 ENCOUNTER — Other Ambulatory Visit: Payer: Self-pay | Admitting: Nurse Practitioner

## 2023-08-23 ENCOUNTER — Telehealth: Payer: Self-pay

## 2023-08-23 DIAGNOSIS — R7989 Other specified abnormal findings of blood chemistry: Secondary | ICD-10-CM

## 2023-08-23 DIAGNOSIS — E559 Vitamin D deficiency, unspecified: Secondary | ICD-10-CM

## 2023-08-23 MED ORDER — ASPIRIN 81 MG PO TBEC: 81.0000 mg | DELAYED_RELEASE_TABLET | Freq: Every day | ORAL | 12 refills | Status: DC

## 2023-08-23 MED ORDER — VITAMIN D (ERGOCALCIFEROL) 1.25 MG (50000 UNIT) PO CAPS: 50000.0000 [IU] | ORAL_CAPSULE | ORAL | 0 refills | Status: DC

## 2023-08-23 NOTE — Telephone Encounter (Signed)
 Sent to [facility]

## 2023-08-29 ENCOUNTER — Other Ambulatory Visit: Payer: Self-pay | Admitting: Nurse Practitioner

## 2023-08-29 ENCOUNTER — Other Ambulatory Visit (HOSPITAL_COMMUNITY): Payer: Self-pay

## 2023-08-29 DIAGNOSIS — D571 Sickle-cell disease without crisis: Secondary | ICD-10-CM

## 2023-08-30 ENCOUNTER — Other Ambulatory Visit (HOSPITAL_COMMUNITY): Payer: Self-pay

## 2023-08-30 LAB — DRUG SCREEN 11 W/CONF, SE
Amphetamines, IA: NEGATIVE ng/mL
Barbiturates, IA: NEGATIVE ug/mL
Benzodiazepines, IA: NEGATIVE ng/mL
Cocaine & Metabolite, IA: NEGATIVE ng/mL
Ethyl Alcohol, Enz: NEGATIVE g/dL
Methadone, IA: NEGATIVE ng/mL
Opiates, IA: NEGATIVE ng/mL
Oxycodones, IA: POSITIVE ng/mL — AB
Phencyclidine, IA: NEGATIVE ng/mL
Propoxyphene, IA: NEGATIVE ng/mL
THC(Marijuana) Metabolite, IA: NEGATIVE ng/mL

## 2023-08-30 LAB — CMP14+CBC/D/PLT+FER+RETIC+V...
ALT: 18 [IU]/L (ref 0–44)
AST: 17 [IU]/L (ref 0–40)
Albumin: 4.3 g/dL (ref 4.1–5.1)
Alkaline Phosphatase: 116 [IU]/L (ref 44–121)
BUN/Creatinine Ratio: 8 — ABNORMAL LOW (ref 9–20)
BUN: 6 mg/dL (ref 6–24)
Basophils Absolute: 0.1 10*3/uL (ref 0.0–0.2)
Basos: 1 %
Bilirubin Total: 1.8 mg/dL — ABNORMAL HIGH (ref 0.0–1.2)
CO2: 24 mmol/L (ref 20–29)
Calcium: 9.9 mg/dL (ref 8.7–10.2)
Chloride: 103 mmol/L (ref 96–106)
Creatinine, Ser: 0.74 mg/dL — ABNORMAL LOW (ref 0.76–1.27)
EOS (ABSOLUTE): 0.2 10*3/uL (ref 0.0–0.4)
Eos: 2 %
Ferritin: 365 ng/mL (ref 30–400)
Globulin, Total: 3.6 g/dL (ref 1.5–4.5)
Glucose: 89 mg/dL (ref 70–99)
Hematocrit: 25.2 % — ABNORMAL LOW (ref 37.5–51.0)
Hemoglobin: 8.3 g/dL — ABNORMAL LOW (ref 13.0–17.7)
Immature Grans (Abs): 0 10*3/uL (ref 0.0–0.1)
Immature Granulocytes: 0 %
Lymphocytes Absolute: 2.2 10*3/uL (ref 0.7–3.1)
Lymphs: 24 %
MCH: 28.2 pg (ref 26.6–33.0)
MCHC: 32.9 g/dL (ref 31.5–35.7)
MCV: 86 fL (ref 79–97)
Monocytes Absolute: 0.8 10*3/uL (ref 0.1–0.9)
Monocytes: 8 %
NRBC: 1 % — ABNORMAL HIGH (ref 0–0)
Neutrophils Absolute: 6 10*3/uL (ref 1.4–7.0)
Neutrophils: 65 %
Platelets: 1078 10*3/uL (ref 150–450)
Potassium: 4.6 mmol/L (ref 3.5–5.2)
RBC: 2.94 x10E6/uL — ABNORMAL LOW (ref 4.14–5.80)
RDW: 20.5 % — ABNORMAL HIGH (ref 11.6–15.4)
Retic Ct Pct: 9.9 % — ABNORMAL HIGH (ref 0.6–2.6)
Sodium: 139 mmol/L (ref 134–144)
Total Protein: 7.9 g/dL (ref 6.0–8.5)
Vit D, 25-Hydroxy: 10.2 ng/mL — ABNORMAL LOW (ref 30.0–100.0)
WBC: 9.3 10*3/uL (ref 3.4–10.8)
eGFR: 117 mL/min/{1.73_m2} (ref >59)

## 2023-08-30 LAB — OXYCODONES,MS,WB/SP RFX
Oxycocone: 13 ng/mL
Oxycodones Confirmation: POSITIVE
Oxymorphone: NEGATIVE ng/mL

## 2023-08-30 MED ORDER — OXYCODONE-ACETAMINOPHEN 10-325 MG PO TABS
1.0000 | ORAL_TABLET | Freq: Four times a day (QID) | ORAL | 0 refills | Status: DC | PRN
  Filled 2023-08-31: qty 60, 15d supply, fill #0

## 2023-08-31 ENCOUNTER — Other Ambulatory Visit (HOSPITAL_COMMUNITY): Payer: Self-pay

## 2023-08-31 ENCOUNTER — Ambulatory Visit: Payer: Self-pay

## 2023-09-12 ENCOUNTER — Other Ambulatory Visit (HOSPITAL_COMMUNITY): Payer: Self-pay

## 2023-09-12 ENCOUNTER — Other Ambulatory Visit: Payer: Self-pay | Admitting: Nurse Practitioner

## 2023-09-12 DIAGNOSIS — D571 Sickle-cell disease without crisis: Secondary | ICD-10-CM

## 2023-09-13 ENCOUNTER — Other Ambulatory Visit (HOSPITAL_COMMUNITY): Payer: Self-pay

## 2023-09-13 MED ORDER — OXYCODONE-ACETAMINOPHEN 10-325 MG PO TABS
1.0000 | ORAL_TABLET | Freq: Four times a day (QID) | ORAL | 0 refills | Status: DC | PRN
  Filled 2023-09-14: qty 60, 15d supply, fill #0

## 2023-09-13 NOTE — Telephone Encounter (Signed)
Please advise Kh

## 2023-09-14 ENCOUNTER — Other Ambulatory Visit (HOSPITAL_COMMUNITY): Payer: Self-pay

## 2023-09-21 ENCOUNTER — Ambulatory Visit: Payer: No Typology Code available for payment source

## 2023-10-06 ENCOUNTER — Other Ambulatory Visit (HOSPITAL_COMMUNITY): Payer: Self-pay

## 2023-10-06 ENCOUNTER — Other Ambulatory Visit: Payer: Self-pay | Admitting: Nurse Practitioner

## 2023-10-06 DIAGNOSIS — D571 Sickle-cell disease without crisis: Secondary | ICD-10-CM

## 2023-10-06 MED ORDER — OXYCODONE-ACETAMINOPHEN 10-325 MG PO TABS
1.0000 | ORAL_TABLET | Freq: Four times a day (QID) | ORAL | 0 refills | Status: DC | PRN
  Filled 2023-10-06: qty 60, 15d supply, fill #0

## 2023-10-27 ENCOUNTER — Other Ambulatory Visit (HOSPITAL_COMMUNITY): Payer: Self-pay

## 2023-10-27 ENCOUNTER — Other Ambulatory Visit: Payer: Self-pay | Admitting: Nurse Practitioner

## 2023-10-27 DIAGNOSIS — D571 Sickle-cell disease without crisis: Secondary | ICD-10-CM

## 2023-10-27 MED ORDER — OXYCODONE-ACETAMINOPHEN 10-325 MG PO TABS
1.0000 | ORAL_TABLET | Freq: Four times a day (QID) | ORAL | 0 refills | Status: DC | PRN
  Filled 2023-10-27: qty 60, 15d supply, fill #0

## 2023-11-15 ENCOUNTER — Other Ambulatory Visit: Payer: Self-pay | Admitting: Nurse Practitioner

## 2023-11-15 ENCOUNTER — Other Ambulatory Visit (HOSPITAL_COMMUNITY): Payer: Self-pay

## 2023-11-15 DIAGNOSIS — D571 Sickle-cell disease without crisis: Secondary | ICD-10-CM

## 2023-11-16 ENCOUNTER — Other Ambulatory Visit (HOSPITAL_COMMUNITY): Payer: Self-pay

## 2023-11-16 MED ORDER — OXYCODONE-ACETAMINOPHEN 10-325 MG PO TABS
1.0000 | ORAL_TABLET | Freq: Four times a day (QID) | ORAL | 0 refills | Status: DC | PRN
  Filled 2023-11-16: qty 60, 15d supply, fill #0

## 2023-11-20 ENCOUNTER — Encounter: Payer: Self-pay | Admitting: Nurse Practitioner

## 2023-11-20 ENCOUNTER — Telehealth: Payer: Self-pay | Admitting: Nurse Practitioner

## 2023-11-20 VITALS — Ht 73.0 in | Wt 155.0 lb

## 2023-11-20 DIAGNOSIS — D571 Sickle-cell disease without crisis: Secondary | ICD-10-CM

## 2023-11-20 NOTE — Patient Instructions (Signed)
 [name]

## 2023-11-20 NOTE — Progress Notes (Signed)
 Virtual Visit via Video Note  I connected with [name] on 11/20/23 at  1:00 PM EDT by a video enabled telemedicine application and verified that I am speaking with the correct person using two identifiers.  Location: Patient: home Provider: office   I discussed the limitations of evaluation and management by telemedicine and the availability of in person appointments. The patient expressed understanding and agreed to proceed.  History of Present Illness:  Been out of the house of the past few months  Patient presents today for follow-up on sickle cell through video visit.  Overall he has been doing well.  Patient is currently in [place].  He states over the past few months he has been doing very well and has not had any crisis or any pain at home.  He does need labs and will let us know when he is back in town so we can get him scheduled.  He states that he takes half a tablet of pain pill in the mornings and a half in the afternoons usually. Denies f/c/s, n/v/d, hemoptysis, PND, leg swelling. Denies chest pain or edema.     Observations/Objective:     11/20/2023    1:13 PM 08/22/2023    1:25 PM 02/01/2023    3:11 PM  Vitals with BMI  Height 6\' 1"   6\' 1"   Weight 155 lbs 149 lbs 147 lbs  BMI 20.45  19.4  Systolic  102 100  Diastolic  64 80  Pulse  78 74      Assessment and Plan:  1. Sickle cell disease without crisis (HCC) (Primary)  -Discussed need for good hydration  -Monitor hydration status  -Avoid heat, cold, stress, and infectious triggers  -Discussed the importance of adherence to medication regimen  -Please seek medical attention immediately if any symptoms of bleeding, anemia, infection occurs      I discussed the assessment and treatment plan with the patient. The patient was provided an opportunity to ask questions and all were answered. The patient agreed with the plan and demonstrated an understanding of the instructions.   The patient was advised  to call back or seek an in-person evaluation if the symptoms worsen or if the condition fails to improve as anticipated.  I provided 22 minutes of non-face-to-face time during this encounter.   Ivonne Andrew, NP

## 2023-12-04 ENCOUNTER — Other Ambulatory Visit (HOSPITAL_COMMUNITY): Payer: Self-pay

## 2023-12-04 ENCOUNTER — Other Ambulatory Visit: Payer: Self-pay | Admitting: Nurse Practitioner

## 2023-12-04 DIAGNOSIS — D571 Sickle-cell disease without crisis: Secondary | ICD-10-CM

## 2023-12-04 MED ORDER — OXYCODONE-ACETAMINOPHEN 10-325 MG PO TABS
1.0000 | ORAL_TABLET | Freq: Four times a day (QID) | ORAL | 0 refills | Status: DC | PRN
Start: 2023-12-04 — End: 2023-12-29
  Filled 2023-12-04: qty 60, 15d supply, fill #0

## 2023-12-29 ENCOUNTER — Other Ambulatory Visit (HOSPITAL_COMMUNITY): Payer: Self-pay

## 2023-12-29 ENCOUNTER — Other Ambulatory Visit: Payer: Self-pay | Admitting: Nurse Practitioner

## 2023-12-29 DIAGNOSIS — D571 Sickle-cell disease without crisis: Secondary | ICD-10-CM

## 2023-12-29 MED ORDER — OXYCODONE-ACETAMINOPHEN 10-325 MG PO TABS
1.0000 | ORAL_TABLET | Freq: Four times a day (QID) | ORAL | 0 refills | Status: DC | PRN
  Filled 2023-12-29: qty 60, 15d supply, fill #0

## 2023-12-29 NOTE — Telephone Encounter (Signed)
 Please advise [facility]

## 2024-01-12 ENCOUNTER — Other Ambulatory Visit (HOSPITAL_COMMUNITY): Payer: Self-pay

## 2024-01-12 ENCOUNTER — Other Ambulatory Visit: Payer: Self-pay | Admitting: Nurse Practitioner

## 2024-01-12 DIAGNOSIS — D571 Sickle-cell disease without crisis: Secondary | ICD-10-CM

## 2024-01-12 DIAGNOSIS — M25552 Pain in left hip: Secondary | ICD-10-CM | POA: Diagnosis not present

## 2024-01-12 DIAGNOSIS — R079 Chest pain, unspecified: Secondary | ICD-10-CM | POA: Diagnosis not present

## 2024-01-12 DIAGNOSIS — D5700 Hb-SS disease with crisis, unspecified: Secondary | ICD-10-CM | POA: Diagnosis not present

## 2024-01-12 DIAGNOSIS — R0781 Pleurodynia: Secondary | ICD-10-CM | POA: Diagnosis not present

## 2024-01-15 ENCOUNTER — Other Ambulatory Visit (HOSPITAL_COMMUNITY): Payer: Self-pay

## 2024-01-15 MED ORDER — OXYCODONE-ACETAMINOPHEN 10-325 MG PO TABS
1.0000 | ORAL_TABLET | Freq: Four times a day (QID) | ORAL | 0 refills | Status: DC | PRN
  Filled 2024-01-15: qty 60, 15d supply, fill #0

## 2024-02-02 ENCOUNTER — Other Ambulatory Visit: Payer: Self-pay | Admitting: Nurse Practitioner

## 2024-02-02 ENCOUNTER — Other Ambulatory Visit (HOSPITAL_COMMUNITY): Payer: Self-pay

## 2024-02-02 DIAGNOSIS — D571 Sickle-cell disease without crisis: Secondary | ICD-10-CM

## 2024-02-05 ENCOUNTER — Other Ambulatory Visit (HOSPITAL_COMMUNITY): Payer: Self-pay

## 2024-02-05 MED ORDER — OXYCODONE-ACETAMINOPHEN 10-325 MG PO TABS
1.0000 | ORAL_TABLET | Freq: Four times a day (QID) | ORAL | 0 refills | Status: DC | PRN
  Filled 2024-02-05: qty 60, 15d supply, fill #0

## 2024-02-19 ENCOUNTER — Other Ambulatory Visit (HOSPITAL_COMMUNITY): Payer: Self-pay

## 2024-02-19 ENCOUNTER — Other Ambulatory Visit: Payer: Self-pay | Admitting: Nurse Practitioner

## 2024-02-19 DIAGNOSIS — D571 Sickle-cell disease without crisis: Secondary | ICD-10-CM

## 2024-02-19 NOTE — Telephone Encounter (Signed)
 Please advise [facility]

## 2024-02-21 ENCOUNTER — Other Ambulatory Visit (HOSPITAL_COMMUNITY): Payer: Self-pay

## 2024-02-21 MED ORDER — OXYCODONE-ACETAMINOPHEN 10-325 MG PO TABS
1.0000 | ORAL_TABLET | Freq: Four times a day (QID) | ORAL | 0 refills | Status: DC | PRN
  Filled 2024-02-21: qty 60, 15d supply, fill #0

## 2024-02-23 ENCOUNTER — Ambulatory Visit: Payer: Self-pay

## 2024-02-23 ENCOUNTER — Encounter (HOSPITAL_COMMUNITY): Payer: Self-pay | Admitting: Internal Medicine

## 2024-02-23 ENCOUNTER — Inpatient Hospital Stay (HOSPITAL_COMMUNITY)
Admission: AD | Admit: 2024-02-23 | Discharge: 2024-02-27 | DRG: 812 | Disposition: A | Discharge status: Home / Self Care | Source: Ambulatory Visit | Attending: Internal Medicine | Admitting: Internal Medicine

## 2024-02-23 ENCOUNTER — Other Ambulatory Visit: Payer: Self-pay

## 2024-02-23 ENCOUNTER — Telehealth (HOSPITAL_COMMUNITY): Payer: Self-pay | Admitting: *Deleted

## 2024-02-23 DIAGNOSIS — D5700 Hb-SS disease with crisis, unspecified: Secondary | ICD-10-CM | POA: Diagnosis not present

## 2024-02-23 DIAGNOSIS — G894 Chronic pain syndrome: Secondary | ICD-10-CM | POA: Diagnosis not present

## 2024-02-23 DIAGNOSIS — Z79899 Other long term (current) drug therapy: Secondary | ICD-10-CM | POA: Diagnosis not present

## 2024-02-23 DIAGNOSIS — Z833 Family history of diabetes mellitus: Secondary | ICD-10-CM | POA: Diagnosis not present

## 2024-02-23 DIAGNOSIS — Z832 Family history of diseases of the blood and blood-forming organs and certain disorders involving the immune mechanism: Secondary | ICD-10-CM | POA: Diagnosis not present

## 2024-02-23 DIAGNOSIS — Z7982 Long term (current) use of aspirin: Secondary | ICD-10-CM | POA: Diagnosis not present

## 2024-02-23 DIAGNOSIS — D638 Anemia in other chronic diseases classified elsewhere: Secondary | ICD-10-CM | POA: Diagnosis not present

## 2024-02-23 DIAGNOSIS — D72829 Elevated white blood cell count, unspecified: Secondary | ICD-10-CM | POA: Diagnosis not present

## 2024-02-23 DIAGNOSIS — Z9049 Acquired absence of other specified parts of digestive tract: Secondary | ICD-10-CM | POA: Diagnosis not present

## 2024-02-23 DIAGNOSIS — F1120 Opioid dependence, uncomplicated: Secondary | ICD-10-CM | POA: Diagnosis present

## 2024-02-23 LAB — CBC WITH DIFFERENTIAL/PLATELET
Abs Immature Granulocytes: 0.38 K/uL — ABNORMAL HIGH (ref 0.00–0.07)
Basophils Absolute: 0.1 K/uL (ref 0.0–0.1)
Basophils Relative: 1 %
Eosinophils Absolute: 0.3 K/uL (ref 0.0–0.5)
Eosinophils Relative: 4 %
HCT: 24.7 % — ABNORMAL LOW (ref 39.0–52.0)
Hemoglobin: 8.8 g/dL — ABNORMAL LOW (ref 13.0–17.0)
Immature Granulocytes: 4 %
Lymphocytes Relative: 27 %
Lymphs Abs: 2.5 K/uL (ref 0.7–4.0)
MCH: 31 pg (ref 26.0–34.0)
MCHC: 35.6 g/dL (ref 30.0–36.0)
MCV: 87 fL (ref 80.0–100.0)
Monocytes Absolute: 0.6 K/uL (ref 0.1–1.0)
Monocytes Relative: 7 %
Neutro Abs: 5.6 K/uL (ref 1.7–7.7)
Neutrophils Relative %: 57 %
Platelets: 494 K/uL — ABNORMAL HIGH (ref 150–400)
RBC: 2.84 MIL/uL — ABNORMAL LOW (ref 4.22–5.81)
RDW: 19.8 % — ABNORMAL HIGH (ref 11.5–15.5)
WBC: 9.5 K/uL (ref 4.0–10.5)
nRBC: 3.2 % — ABNORMAL HIGH (ref 0.0–0.2)

## 2024-02-23 LAB — COMPREHENSIVE METABOLIC PANEL WITH GFR
ALT: 23 U/L (ref 0–44)
AST: 37 U/L (ref 15–41)
Albumin: 4.2 g/dL (ref 3.5–5.0)
Alkaline Phosphatase: 73 U/L (ref 38–126)
Anion gap: 9 (ref 5–15)
BUN: 11 mg/dL (ref 6–20)
CO2: 25 mmol/L (ref 22–32)
Calcium: 9.2 mg/dL (ref 8.9–10.3)
Chloride: 103 mmol/L (ref 98–111)
Creatinine, Ser: 0.6 mg/dL — ABNORMAL LOW (ref 0.61–1.24)
GFR, Estimated: >60 mL/min (ref >60)
Glucose, Bld: 107 mg/dL — ABNORMAL HIGH (ref 70–99)
Potassium: 4.3 mmol/L (ref 3.5–5.1)
Sodium: 137 mmol/L (ref 135–145)
Total Bilirubin: 2.8 mg/dL — ABNORMAL HIGH (ref 0.0–1.2)
Total Protein: 7.9 g/dL (ref 6.5–8.1)

## 2024-02-23 LAB — RETICULOCYTES
Immature Retic Fract: 26.4 % — ABNORMAL HIGH (ref 2.3–15.9)
RBC.: 2.81 MIL/uL — ABNORMAL LOW (ref 4.22–5.81)
Retic Count, Absolute: 304 K/uL — ABNORMAL HIGH (ref 19.0–186.0)
Retic Ct Pct: 10.5 % — ABNORMAL HIGH (ref 0.4–3.1)

## 2024-02-23 LAB — LACTATE DEHYDROGENASE: LDH: 359 U/L — ABNORMAL HIGH (ref 98–192)

## 2024-02-23 LAB — HIV ANTIBODY (ROUTINE TESTING W REFLEX): HIV Screen 4th Generation wRfx: NONREACTIVE

## 2024-02-23 MED ORDER — SODIUM CHLORIDE 0.45 % IV SOLN
INTRAVENOUS | Status: AC
  Administered 2024-02-23: 125 mL/h via INTRAVENOUS

## 2024-02-23 MED ORDER — KETOROLAC TROMETHAMINE 15 MG/ML IJ SOLN
15.0000 mg | Freq: Four times a day (QID) | INTRAMUSCULAR | Status: DC
  Administered 2024-02-23: 15 mg via INTRAVENOUS
  Filled 2024-02-23: qty 1

## 2024-02-23 MED ORDER — OXYCODONE-ACETAMINOPHEN 5-325 MG PO TABS
1.0000 | ORAL_TABLET | Freq: Four times a day (QID) | ORAL | Status: DC | PRN
  Administered 2024-02-23 – 2024-02-25 (×7): 1 via ORAL
  Filled 2024-02-23 (×7): qty 1

## 2024-02-23 MED ORDER — ASPIRIN 81 MG PO TBEC: 81.0000 mg | DELAYED_RELEASE_TABLET | Freq: Every day | ORAL | Status: DC

## 2024-02-23 MED ORDER — POLYETHYLENE GLYCOL 3350 17 G PO PACK: 17.0000 g | PACK | Freq: Every day | ORAL | Status: DC | PRN

## 2024-02-23 MED ORDER — VITAMIN D 25 MCG (1000 UNIT) PO TABS
1000.0000 [IU] | ORAL_TABLET | Freq: Every day | ORAL | Status: DC
  Administered 2024-02-24 – 2024-02-27 (×4): 1000 [IU] via ORAL
  Filled 2024-02-23 (×4): qty 1

## 2024-02-23 MED ORDER — ENOXAPARIN SODIUM 40 MG/0.4ML IJ SOSY
40.0000 mg | PREFILLED_SYRINGE | INTRAMUSCULAR | Status: DC
  Administered 2024-02-23 – 2024-02-25 (×3): 40 mg via SUBCUTANEOUS
  Filled 2024-02-23 (×4): qty 0.4

## 2024-02-23 MED ORDER — SODIUM CHLORIDE 0.45 % IV SOLN: INTRAVENOUS | Status: DC

## 2024-02-23 MED ORDER — ACETAMINOPHEN 500 MG PO TABS
1000.0000 mg | ORAL_TABLET | Freq: Once | ORAL | Status: AC
  Administered 2024-02-23: 1000 mg via ORAL
  Filled 2024-02-23: qty 2

## 2024-02-23 MED ORDER — FOLIC ACID 1 MG PO TABS
1.0000 mg | ORAL_TABLET | Freq: Every day | ORAL | Status: DC
  Administered 2024-02-24 – 2024-02-27 (×4): 1 mg via ORAL
  Filled 2024-02-23 (×4): qty 1

## 2024-02-23 MED ORDER — VITAMIN D (ERGOCALCIFEROL) 1.25 MG (50000 UNIT) PO CAPS: 50000.0000 [IU] | ORAL_CAPSULE | ORAL | Status: DC

## 2024-02-23 MED ORDER — HYDROXYUREA 500 MG PO CAPS
500.0000 mg | ORAL_CAPSULE | Freq: Two times a day (BID) | ORAL | Status: DC
  Administered 2024-02-23 – 2024-02-27 (×8): 500 mg via ORAL
  Filled 2024-02-23 (×8): qty 1

## 2024-02-23 MED ORDER — KETOROLAC TROMETHAMINE 15 MG/ML IJ SOLN
15.0000 mg | Freq: Four times a day (QID) | INTRAMUSCULAR | Status: DC
  Administered 2024-02-23 – 2024-02-27 (×15): 15 mg via INTRAVENOUS
  Filled 2024-02-23 (×15): qty 1

## 2024-02-23 MED ORDER — DIPHENHYDRAMINE HCL 25 MG PO CAPS
25.0000 mg | ORAL_CAPSULE | ORAL | Status: DC | PRN
  Administered 2024-02-23 – 2024-02-25 (×2): 25 mg via ORAL
  Filled 2024-02-23 (×2): qty 1

## 2024-02-23 MED ORDER — OXYCODONE HCL 5 MG PO TABS
5.0000 mg | ORAL_TABLET | Freq: Four times a day (QID) | ORAL | Status: DC | PRN
  Administered 2024-02-23 – 2024-02-25 (×7): 5 mg via ORAL
  Filled 2024-02-23 (×7): qty 1

## 2024-02-23 MED ORDER — SENNOSIDES-DOCUSATE SODIUM 8.6-50 MG PO TABS
1.0000 | ORAL_TABLET | Freq: Two times a day (BID) | ORAL | Status: DC
  Administered 2024-02-23 – 2024-02-27 (×8): 1 via ORAL
  Filled 2024-02-23 (×8): qty 1

## 2024-02-23 MED ORDER — HYDROMORPHONE HCL 1 MG/ML IJ SOLN
1.0000 mg | Freq: Once | INTRAMUSCULAR | Status: AC
  Administered 2024-02-23: 1 mg via INTRAVENOUS
  Filled 2024-02-23: qty 1

## 2024-02-23 MED ORDER — SODIUM CHLORIDE 0.9% FLUSH: 9.0000 mL | INTRAVENOUS | Status: DC | PRN

## 2024-02-23 MED ORDER — OXYCODONE-ACETAMINOPHEN 10-325 MG PO TABS: 1.0000 | ORAL_TABLET | Freq: Four times a day (QID) | ORAL | Status: DC | PRN

## 2024-02-23 MED ORDER — NALOXONE HCL 0.4 MG/ML IJ SOLN: 0.4000 mg | INTRAMUSCULAR | Status: DC | PRN

## 2024-02-23 MED ORDER — HYDROMORPHONE 1 MG/ML IV SOLN
INTRAVENOUS | Status: DC
  Administered 2024-02-23: 6.5 mg via INTRAVENOUS
  Administered 2024-02-23: 30 mg via INTRAVENOUS
  Administered 2024-02-23 – 2024-02-24 (×2): 8 mg via INTRAVENOUS
  Administered 2024-02-24: 3.5 mg via INTRAVENOUS
  Administered 2024-02-24: 8 mg via INTRAVENOUS
  Administered 2024-02-24: 4.5 mg via INTRAVENOUS
  Administered 2024-02-24: 5 mg via INTRAVENOUS
  Administered 2024-02-24: 30 mg via INTRAVENOUS
  Administered 2024-02-24: 2.5 mg via INTRAVENOUS
  Administered 2024-02-25: 2 mg via INTRAVENOUS
  Administered 2024-02-25: 30 mg via INTRAVENOUS
  Administered 2024-02-25 (×2): 4 mg via INTRAVENOUS
  Administered 2024-02-25: 1 mg via INTRAVENOUS
  Administered 2024-02-25: 2.5 mg via INTRAVENOUS
  Administered 2024-02-25: 1 mg via INTRAVENOUS
  Administered 2024-02-26: 2.5 mg via INTRAVENOUS
  Administered 2024-02-26: 4.5 mg via INTRAVENOUS
  Administered 2024-02-26 (×2): 0.5 mg via INTRAVENOUS
  Administered 2024-02-26: 8.5 mg via INTRAVENOUS
  Administered 2024-02-26: 2.5 mg via INTRAVENOUS
  Administered 2024-02-27: 0.5 mg via INTRAVENOUS
  Administered 2024-02-27: 1 mg via INTRAVENOUS
  Administered 2024-02-27: 2.5 mg via INTRAVENOUS
  Filled 2024-02-23 (×4): qty 30

## 2024-02-23 MED ORDER — ONDANSETRON HCL 4 MG/2ML IJ SOLN
4.0000 mg | Freq: Four times a day (QID) | INTRAMUSCULAR | Status: DC | PRN
  Administered 2024-02-23: 4 mg via INTRAVENOUS
  Filled 2024-02-23: qty 2

## 2024-02-23 NOTE — Progress Notes (Signed)
 Patient admitted to the day hospital for treatment of sickle cell pain crisis. Patient reported pain rated 5/10 in the back and chest - typical chest pain. Patient placed on Dilaudid  PCA, given IV Toradol . IV zofran , PO tylenol  and hydrated with IV fluids. Current pain level is a 6. Patient is transferred to [facility], room 1. Report was given to Isaiah RAMAN., LPN.  Patient alert, oriented and transported in a wheelchair.SABRA

## 2024-02-23 NOTE — Telephone Encounter (Signed)
 Patient called requesting to come to the day hospital for sickle cell pain. Patient reports back pain rated 7/10. Reports taking Oxycodone  for pain around 30 minutes prior to call. Admits to some chest pain but reports that it is typical of his crisis. Denies fever, nausea, vomiting, diarrhea, abdominal pain and priapism. Admits to having transportation without driving. Onyeje, NP notified and advised that patient come to the day hospital for pain management. Patient advised and expresses an understanding.

## 2024-02-23 NOTE — Plan of Care (Signed)

## 2024-02-23 NOTE — Telephone Encounter (Signed)
 FYI Only or Action Required?: FYI only for provider.  Patient was last seen in primary care on 11/20/2023 by Oley Bascom RAMAN, NP.  Called Nurse Triage reporting Sickle Cell Pain Crisis.  Symptoms began several days ago.  Interventions attempted: Prescription medications: Percocet.  Symptoms are: lower back pain radiating to hips, mild chest pain ribs/bone paingradually worsening.  Triage Disposition: Go to ED Now (Notify PCP)- [facility]  Patient/caregiver understands and will follow disposition?: Unsure            Copied from CRM [id number]. Topic: Clinical - Red Word Triage >> Feb 23, 2024  8:40 AM [name] wrote: Red Word that prompted transfer to Nurse Triage: Patient is calling to report a sickle cell crises. Patient is calling to reporting that he his pain is in back radiating down to his leg. In a few minutes not going to be able to walk. Pain starting yesterday. More so when he woke up today. Reason for Disposition  Chest pain > 5 minutes  Answer Assessment - Initial Assessment Questions Patient states when he is in crisis [facility]. [facility] and transferred patient to staff.  1. ONSET: When did the pain begin?      Past 2 days, worsened this morning.  2. LOCATION: Where does it hurt?      Back (lower), moving down to both hips.  3. SEVERITY: How bad is the pain?  (e.g., Scale 1-10; mild, moderate, or severe)     He states when he woke up it was a 3/10. 7/10 now and he states he feels it is getting worse.  4. PATTERN: Is the pain constant? (e.g., yes, no; constant, intermittent)      Constant.  5. CAUSE:  What do you think is causing the pain? Is this pain similar to the acute pain attacks you have had in the past? (e.g., similar location, quality, intensity)     This feels like his acute pain sickle cell crisis attacks.  6. PAIN MEDICINES:       Oxycodone -acetaminophen .  7. OTHER SYMPTOMS: Do you have any other symptoms? (e.g., fever, chest pain, shortness of breath, new areas of swelling or redness)     Denies any areas of swelling/redness/warmth, SOB. He states he has some chest pain, states it feels like his ribs and the bones, (mild) and states it is as frequent as his heartbeat.  8. PREGNANCY: Is there any chance you are pregnant? When was your last menstrual period?     N/A.  Protocols used: Sickle Cell Disease - Acute Pain Episode (Crisis)-A-AH

## 2024-02-23 NOTE — H&P (Signed)
 [facility] History and Physical  [name] FMW:[id number] DOB: 29-Sep-1980 DOA: 02/23/2024  PCP: [name] Bascom RAMAN, NP   Chief Complaint:  Chief Complaint  Patient presents with   Sickle Cell Pain Crisis    HPI: [name] is a 43 y.o. male with history of sickle cell disease chronic pain syndrome, opiate dependence and tolerance, and anemia of chronic disease presents with complaints of low back pain, bilateral hip pain that is consistent with his previous pain crisis. Patient has been taking home medications consistently without very much relief. He last had Percocet this a.m. Pain intensity is 9/10, constant, and aching. Patient currently denies any fever, chills, chest pain, urinary symptoms, nausea, vomiting, or diarrhea. No sick contacts,   Systemic Review: General: The patient denies anorexia, fever, weight loss Cardiac: Denies chest pain, syncope, palpitations, pedal edema  Respiratory: Denies cough, shortness of breath, wheezing GI: Denies severe indigestion/heartburn, abdominal pain, nausea, vomiting, diarrhea and constipation GU: Denies hematuria, incontinence, dysuria  Musculoskeletal: Generalize boy tenderness/ Bilateral hip tenderness Skin: Denies suspicious skin lesions Neurologic: Denies focal weakness or numbness, change in vision  Past Medical History:  Diagnosis Date   Cancer (HCC)    Sickle cell anemia (HCC)     Past Surgical History:  Procedure Laterality Date   CHOLECYSTECTOMY      No Known Allergies  Family History  Problem Relation Age of Onset   Sickle cell anemia Mother    Diabetes Mother    Sickle cell trait Father    Sickle cell trait Sister    Sickle cell trait Brother    Cancer Maternal Grandmother       Prior to Admission medications   Medication Sig Start Date End Date Taking? Authorizing Provider  hydroxyurea  (HYDREA ) 500 MG capsule Take 1 capsule (500 mg total) by mouth 2 (two) times daily. May take with food to  minimize GI effects. 04/12/23  Yes [name] Bascom RAMAN, NP  oxyCODONE -acetaminophen  (PERCOCET) 10-325 MG tablet Take 1 tablet by mouth every 6 hours as needed for pain. 02/21/24  Yes [name] Bascom RAMAN, NP  aspirin  EC 81 MG tablet Take 1 tablet (81 mg total) by mouth daily. Swallow whole. Patient not taking: Reported on 11/20/2023 08/23/23   [name] Bascom RAMAN, NP  cholecalciferol  (VITAMIN D3) 25 MCG (1000 UNIT) tablet Take 1,000 Units by mouth daily.    [provider]  folic acid  (FOLVITE ) 400 MCG tablet Take 1 tablet (400 mcg total) by mouth daily. 02/01/23   [name] Bascom RAMAN, NP  Vitamin D , Ergocalciferol , (DRISDOL ) 1.25 MG (50000 UNIT) CAPS capsule Take 1 capsule (50,000 Units total) by mouth every 7 (seven) days. Patient not taking: Reported on 11/20/2023 08/23/23   [name] Bascom RAMAN, NP     Physical Exam: Vitals:   02/23/24 1052 02/23/24 1111 02/23/24 1152 02/23/24 1354  BP: (!) 105/58  (!) 111/57 (!) 111/54  Pulse: (!) 57  (!) 58 65  Resp: 11 11 15 13   Temp: 98 F (36.7 C)     TempSrc: Temporal     SpO2: 99%  94% 93%    General: Alert, awake, afebrile, anicteric, not in obvious distress HEENT: Normocephalic and Atraumatic, Mucous membranes pink                PERRLA; EOM intact; No scleral icterus,                 Nares: Patent, Oropharynx: Clear, Fair Dentition  Neck: FROM, no cervical lymphadenopathy, thyromegaly, carotid bruit or JVD;  CHEST WALL: No tenderness  CHEST: Normal respiration, clear to auscultation bilaterally  HEART: Regular rate and rhythm; no murmurs rubs or gallops  BACK: No kyphosis or scoliosis; no CVA tenderness  ABDOMEN: Positive Bowel Sounds, soft, non-tender; no masses, no organomegaly EXTREMITIES: No cyanosis, clubbing, or edema SKIN:  no rash or ulceration  CNS: Alert and Oriented x 4, Nonfocal exam, CN 2-12 intact  Labs on Admission:  Basic Metabolic Panel: Recent Labs  Lab 02/23/24 1058  NA 137  K 4.3  CL 103  CO2 25  GLUCOSE 107*   BUN 11  CREATININE 0.60*  CALCIUM 9.2   Liver Function Tests: Recent Labs  Lab 02/23/24 1058  AST 37  ALT 23  ALKPHOS 73  BILITOT 2.8*  PROT 7.9  ALBUMIN 4.2   No results for input(s): LIPASE, AMYLASE in the last 168 hours. No results for input(s): AMMONIA in the last 168 hours. CBC: Recent Labs  Lab 02/23/24 1058  WBC 9.5  NEUTROABS 5.6  HGB 8.8*  HCT 24.7*  MCV 87.0  PLT 494*   Cardiac Enzymes: No results for input(s): CKTOTAL, CKMB, CKMBINDEX, TROPONINI in the last 168 hours.  BNP (last 3 results) No results for input(s): BNP in the last 8760 hours.  ProBNP (last 3 results) No results for input(s): PROBNP in the last 8760 hours.  CBG: No results for input(s): GLUCAP in the last 168 hours.   Assessment/Plan Principal Problem:   Sickle cell anemia with pain (HCC)  [facility] IVF 0 .45% Saline @ 125 mls/hour Weight based Dilaudid  PCA started within 30 minutes of admission IV Toradol  15 mg  x 1 doses Acetaminophen  1000 mg x 1 dose Labs: CBCD, CMP, Retic Count and LDH Monitor vitals very closely, Re-evaluate pain scale every hour 2 L of Oxygen by [place] Patient will be re-evaluated for pain in the context of function and relationship to baseline as care progresses. If no significant relieve from pain (remains above 5/10) will transfer patient to inpatient services for further evaluation and management  Code Status: Full  Family Communication: None  DVT Prophylaxis: Ambulate as tolerated   Time spent: 35 Minutes  Homer CHRISTELLA Cover NP   If 7PM-7AM, please contact night-coverage [email or web address] 02/23/2024, 2:04 PM

## 2024-02-24 DIAGNOSIS — D5700 Hb-SS disease with crisis, unspecified: Secondary | ICD-10-CM

## 2024-02-24 LAB — CBC
HCT: 21.5 % — ABNORMAL LOW (ref 39.0–52.0)
Hemoglobin: 7.7 g/dL — ABNORMAL LOW (ref 13.0–17.0)
MCH: 30.7 pg (ref 26.0–34.0)
MCHC: 35.8 g/dL (ref 30.0–36.0)
MCV: 85.7 fL (ref 80.0–100.0)
Platelets: 443 10*3/uL — ABNORMAL HIGH (ref 150–400)
RBC: 2.51 MIL/uL — ABNORMAL LOW (ref 4.22–5.81)
RDW: 20 % — ABNORMAL HIGH (ref 11.5–15.5)
WBC: 14.4 10*3/uL — ABNORMAL HIGH (ref 4.0–10.5)
nRBC: 7.4 % — ABNORMAL HIGH (ref 0.0–0.2)

## 2024-02-24 MED ORDER — HYDROMORPHONE HCL 2 MG/ML IJ SOLN
2.0000 mg | Freq: Once | INTRAMUSCULAR | Status: AC
  Administered 2024-02-24: 2 mg via INTRAVENOUS
  Filled 2024-02-24: qty 1

## 2024-02-24 MED ORDER — HYDROMORPHONE HCL 1 MG/ML IJ SOLN
1.0000 mg | Freq: Once | INTRAMUSCULAR | Status: AC
  Administered 2024-02-24: 1 mg via INTRAVENOUS
  Filled 2024-02-24: qty 1

## 2024-02-24 NOTE — Progress Notes (Signed)
 SICKLE CELL SERVICE PROGRESS NOTE  [name] FMW:[id number] DOB: August 21, 1980 DOA: 02/23/2024 PCP: Oley Bascom RAMAN, NP  Assessment/Plan: Principal Problem:   Sickle cell anemia with pain (HCC)  Sickle cell pain crisis: Patient on Toradol , Dilaudid  PCA and IV fluids.  Will continue to treatment.  Will continue to assess pain.  Patient has not been using the PCA.  Will continue oral medications as well. Anemia of chronic disease: Continue H&H monitoring.  So far no transfusion required. Chronic pain syndrome: Continue chronic pain management.  Code Status: Full code Family Communication: No family at bedside Disposition Plan: Home when ready  [facility]  Pager [phone]. If 7PM-7AM, please contact night-coverage.  02/24/2024, 10:54 AM  LOS: 1 day   Brief narrative: [name] is a 43 y.o. male with history of sickle cell disease chronic pain syndrome, opiate dependence and tolerance, and anemia of chronic disease presents with complaints of low back pain, bilateral hip pain that is consistent with his previous pain crisis. Patient has been taking home medications consistently without very much relief. He last had Percocet this a.m. Pain intensity is 9/10, constant, and aching. Patient currently denies any fever, chills, chest pain, urinary symptoms, nausea, vomiting, or diarrhea. No sick contacts,  Consultants: None  Procedures: Chest x-ray  Antibiotics: None  HPI/Subjective: Patient is still having pain at 10 out of 10 this morning.  Has not been using his PCA the way he show.  Objective: Vitals:   02/24/24 0421 02/24/24 0552 02/24/24 0757 02/24/24 0944  BP:  136/72  121/75  Pulse:  87  74  Resp: 17  20 16   Temp:  99.1 F (37.3 C)  98.7 F (37.1 C)  TempSrc:  Oral  Oral  SpO2: 97% 98% 96% 97%  Weight:      Height:       Weight change:   Intake/Output Summary (Last 24 hours) at 02/24/2024 1054 Last data filed at 02/24/2024 0300 Gross per 24 hour  Intake  2549.08 ml  Output --  Net 2549.08 ml    General: Alert, awake, oriented x3, in no acute distress.  HEENT: [place]/AT PEERL, EOMI Neck: Trachea midline,  no masses, no thyromegal,y no JVD, no carotid bruit OROPHARYNX:  Moist, No exudate/ erythema/lesions.  Heart: Regular rate and rhythm, without murmurs, rubs, gallops, PMI non-displaced, no heaves or thrills on palpation.  Lungs: Clear to auscultation, no wheezing or rhonchi noted. No increased vocal fremitus resonant to percussion  Abdomen: Soft, nontender, nondistended, positive bowel sounds, no masses no hepatosplenomegaly noted..  Neuro: No focal neurological deficits noted cranial nerves II through XII grossly intact. DTRs 2+ bilaterally upper and lower extremities. Strength 5 out of 5 in bilateral upper and lower extremities. Musculoskeletal: No warm swelling or erythema around joints, no spinal tenderness noted. Psychiatric: Patient alert and oriented x3, good insight and cognition, good recent to remote recall. Lymph node survey: No cervical axillary or inguinal lymphadenopathy noted.   Data Reviewed: Basic Metabolic Panel: Recent Labs  Lab 02/23/24 1058  NA 137  K 4.3  CL 103  CO2 25  GLUCOSE 107*  BUN 11  CREATININE 0.60*  CALCIUM 9.2   Liver Function Tests: Recent Labs  Lab 02/23/24 1058  AST 37  ALT 23  ALKPHOS 73  BILITOT 2.8*  PROT 7.9  ALBUMIN 4.2   No results for input(s): LIPASE, AMYLASE in the last 168 hours. No results for input(s): AMMONIA in the last 168 hours. CBC: Recent Labs  Lab 02/23/24 1058 02/24/24  0714  WBC 9.5 14.4*  NEUTROABS 5.6  --   HGB 8.8* 7.7*  HCT 24.7* 21.5*  MCV 87.0 85.7  PLT 494* 443*   Cardiac Enzymes: No results for input(s): CKTOTAL, CKMB, CKMBINDEX, TROPONINI in the last 168 hours. BNP (last 3 results) No results for input(s): BNP in the last 8760 hours.  ProBNP (last 3 results) No results for input(s): PROBNP in the last 8760 hours.  CBG: No  results for input(s): GLUCAP in the last 168 hours.  No results found for this or any previous visit (from the past 240 hours).   Studies: No results found.  Scheduled Meds:  cholecalciferol   1,000 Units Oral Daily   enoxaparin  (LOVENOX ) injection  40 mg Subcutaneous Q24H   folic acid   1 mg Oral Daily   HYDROmorphone    Intravenous Q4H    HYDROmorphone  (DILAUDID ) injection  2 mg Intravenous Once   hydroxyurea   500 mg Oral BID   ketorolac   15 mg Intravenous Q6H   senna-docusate  1 tablet Oral BID   Continuous Infusions:  sodium chloride  125 mL/hr at 02/24/24 0342    Principal Problem:   Sickle cell anemia with pain (HCC)

## 2024-02-24 NOTE — Plan of Care (Signed)
  Problem: Education: Goal: Knowledge of vaso-occlusive preventative measures will improve Outcome: Progressing Goal: Awareness of infection prevention will improve Outcome: Progressing Goal: Awareness of signs and symptoms of anemia will improve Outcome: Progressing Goal: Long-term complications will improve Outcome: Progressing   Problem: Self-Care: Goal: Ability to incorporate actions that prevent/reduce pain crisis will improve Outcome: Progressing   Problem: Bowel/Gastric: Goal: Gut motility will be maintained Outcome: Progressing   Problem: Respiratory: Goal: Pulmonary complications will be avoided or minimized Outcome: Progressing Goal: Acute Chest Syndrome will be identified early to prevent complications Outcome: Progressing   Problem: Fluid Volume: Goal: Ability to maintain a balanced intake and output will improve Outcome: Progressing   Problem: Sensory: Goal: Pain level will decrease with appropriate interventions Outcome: Progressing

## 2024-02-24 NOTE — Progress Notes (Signed)
   02/24/24 1525  TOC Brief Assessment  Insurance and Status Reviewed  Patient has primary care physician Yes  Home environment has been reviewed home w/ sister  Prior level of function: independent  Prior/Current Home Services No current home services  Social Drivers of Health Review SDOH reviewed no interventions necessary  Readmission risk has been reviewed Yes  Transition of care needs no transition of care needs at this time

## 2024-02-24 NOTE — Plan of Care (Signed)
  Problem: Education: Goal: Knowledge of vaso-occlusive preventative measures will improve Outcome: Progressing Goal: Awareness of infection prevention will improve Outcome: Progressing Goal: Awareness of signs and symptoms of anemia will improve Outcome: Progressing

## 2024-02-25 DIAGNOSIS — D5700 Hb-SS disease with crisis, unspecified: Secondary | ICD-10-CM | POA: Diagnosis not present

## 2024-02-25 LAB — CBC
HCT: 21.2 % — ABNORMAL LOW (ref 39.0–52.0)
Hemoglobin: 7.6 g/dL — ABNORMAL LOW (ref 13.0–17.0)
MCH: 31.1 pg (ref 26.0–34.0)
MCHC: 35.8 g/dL (ref 30.0–36.0)
MCV: 86.9 fL (ref 80.0–100.0)
Platelets: 379 K/uL (ref 150–400)
RBC: 2.44 MIL/uL — ABNORMAL LOW (ref 4.22–5.81)
RDW: 18.6 % — ABNORMAL HIGH (ref 11.5–15.5)
WBC: 10.7 K/uL — ABNORMAL HIGH (ref 4.0–10.5)
nRBC: 11 % — ABNORMAL HIGH (ref 0.0–0.2)

## 2024-02-25 NOTE — Plan of Care (Signed)
  Problem: Education: Goal: Knowledge of vaso-occlusive preventative measures will improve 02/25/2024 0301 by Debora Ou, RN Outcome: Progressing 02/25/2024 0301 by Debora Ou, RN Outcome: Progressing Goal: Awareness of infection prevention will improve 02/25/2024 0301 by Debora Ou, RN Outcome: Progressing 02/25/2024 0301 by Debora Ou, RN Outcome: Progressing Goal: Awareness of signs and symptoms of anemia will improve 02/25/2024 0301 by Debora Ou, RN Outcome: Progressing 02/25/2024 0301 by Debora Ou, RN Outcome: Progressing Goal: Long-term complications will improve 02/25/2024 0301 by Debora Ou, RN Outcome: Progressing 02/25/2024 0301 by Debora Ou, RN Outcome: Progressing

## 2024-02-25 NOTE — Plan of Care (Signed)
   Problem: Education: Goal: Knowledge of vaso-occlusive preventative measures will improve Outcome: Progressing Goal: Awareness of infection prevention will improve Outcome: Progressing Goal: Awareness of signs and symptoms of anemia will improve Outcome: Progressing Goal: Long-term complications will improve Outcome: Progressing

## 2024-02-25 NOTE — Progress Notes (Signed)
 SICKLE CELL SERVICE PROGRESS NOTE  [name] FMW:[id number] DOB: Nov 03, 1980 DOA: 02/23/2024 PCP: Oley Bascom RAMAN, NP  Assessment/Plan: Principal Problem:   Sickle cell anemia with pain (HCC)  Sickle cell pain crisis: Pain is improved today.  Down to 6 out of 10.  Patient on Toradol , Dilaudid  PCA and IV fluids.  Will continue to treatment.  Will continue to assess pain.  Patient has now been using the PCA.  Will continue oral medications as well. Anemia of chronic disease: Continue H&H monitoring.  So far no transfusion required. Chronic pain syndrome: Continue chronic pain management.  Code Status: Full code Family Communication: No family at bedside Disposition Plan: Home when ready  [facility]  Pager [phone]. If 7PM-7AM, please contact night-coverage.  02/25/2024, 5:58 PM  LOS: 2 days   Brief narrative: Kobee Medlen is a 43 y.o. male with history of sickle cell disease chronic pain syndrome, opiate dependence and tolerance, and anemia of chronic disease presents with complaints of low back pain, bilateral hip pain that is consistent with his previous pain crisis. Patient has been taking home medications consistently without very much relief. He last had Percocet this a.m. Pain intensity is 9/10, constant, and aching. Patient currently denies any fever, chills, chest pain, urinary symptoms, nausea, vomiting, or diarrhea. No sick contacts,  Consultants: None  Procedures: Chest x-ray  Antibiotics: None  HPI/Subjective: Patient is still having pain at 6 out of 10 this morning.  Has been using his PCA on the oral medications.  No nausea vomiting or diarrhea.  Objective: Vitals:   02/25/24 1128 02/25/24 1433 02/25/24 1611 02/25/24 1748  BP:  126/82  129/77  Pulse:  87  93  Resp: 12 18 16    Temp:  98.9 F (37.2 C)  99 F (37.2 C)  TempSrc:  Oral  Oral  SpO2:  97%  99%  Weight:      Height:       Weight change:   Intake/Output Summary (Last 24 hours) at  02/25/2024 1758 Last data filed at 02/25/2024 0600 Gross per 24 hour  Intake 483.5 ml  Output --  Net 483.5 ml    General: Alert, awake, oriented x3, in no acute distress.  HEENT: [place]/AT PEERL, EOMI Neck: Trachea midline,  no masses, no thyromegal,y no JVD, no carotid bruit OROPHARYNX:  Moist, No exudate/ erythema/lesions.  Heart: Regular rate and rhythm, without murmurs, rubs, gallops, PMI non-displaced, no heaves or thrills on palpation.  Lungs: Clear to auscultation, no wheezing or rhonchi noted. No increased vocal fremitus resonant to percussion  Abdomen: Soft, nontender, nondistended, positive bowel sounds, no masses no hepatosplenomegaly noted..  Neuro: No focal neurological deficits noted cranial nerves II through XII grossly intact. DTRs 2+ bilaterally upper and lower extremities. Strength 5 out of 5 in bilateral upper and lower extremities. Musculoskeletal: No warm swelling or erythema around joints, no spinal tenderness noted. Psychiatric: Patient alert and oriented x3, good insight and cognition, good recent to remote recall. Lymph node survey: No cervical axillary or inguinal lymphadenopathy noted.   Data Reviewed: Basic Metabolic Panel: Recent Labs  Lab 02/23/24 1058  NA 137  K 4.3  CL 103  CO2 25  GLUCOSE 107*  BUN 11  CREATININE 0.60*  CALCIUM 9.2   Liver Function Tests: Recent Labs  Lab 02/23/24 1058  AST 37  ALT 23  ALKPHOS 73  BILITOT 2.8*  PROT 7.9  ALBUMIN 4.2   No results for input(s): LIPASE, AMYLASE in the last 168 hours. No  results for input(s): AMMONIA in the last 168 hours. CBC: Recent Labs  Lab 02/23/24 1058 02/24/24 0714 02/25/24 0827  WBC 9.5 14.4* 10.7*  NEUTROABS 5.6  --   --   HGB 8.8* 7.7* 7.6*  HCT 24.7* 21.5* 21.2*  MCV 87.0 85.7 86.9  PLT 494* 443* 379   Cardiac Enzymes: No results for input(s): CKTOTAL, CKMB, CKMBINDEX, TROPONINI in the last 168 hours. BNP (last 3 results) No results for input(s): BNP in  the last 8760 hours.  ProBNP (last 3 results) No results for input(s): PROBNP in the last 8760 hours.  CBG: No results for input(s): GLUCAP in the last 168 hours.  No results found for this or any previous visit (from the past 240 hours).   Studies: No results found.  Scheduled Meds:  cholecalciferol   1,000 Units Oral Daily   enoxaparin  (LOVENOX ) injection  40 mg Subcutaneous Q24H   folic acid   1 mg Oral Daily   HYDROmorphone    Intravenous Q4H   hydroxyurea   500 mg Oral BID   ketorolac   15 mg Intravenous Q6H   senna-docusate  1 tablet Oral BID   Continuous Infusions:    Principal Problem:   Sickle cell anemia with pain (HCC)

## 2024-02-26 LAB — CBC
HCT: 20.3 % — ABNORMAL LOW (ref 39.0–52.0)
Hemoglobin: 7.3 g/dL — ABNORMAL LOW (ref 13.0–17.0)
MCH: 31.2 pg (ref 26.0–34.0)
MCHC: 36 g/dL (ref 30.0–36.0)
MCV: 86.8 fL (ref 80.0–100.0)
Platelets: 357 K/uL (ref 150–400)
RBC: 2.34 MIL/uL — ABNORMAL LOW (ref 4.22–5.81)
RDW: 17.8 % — ABNORMAL HIGH (ref 11.5–15.5)
WBC: 9.7 K/uL (ref 4.0–10.5)
nRBC: 8.1 % — ABNORMAL HIGH (ref 0.0–0.2)

## 2024-02-26 NOTE — Progress Notes (Signed)
 Subjective: [name] is a 43 y.o. male with history of sickle cell disease, Chronic pain syndrome, opiate dependence and tolerance, and anemia of chronic disease presents with complaints of low back pain, bilateral hip pain that is consistent with his previous pain crisis.   Patient is reporting slight improvement to right hip pain and rates it at 7/10.  He has no new concerns today.  Denies any fever, chills, chest pain, urinary symptoms, nausea, vomiting, or diarrhea.  Objective:  Vital signs in last 24 hours:  Vitals:   02/26/24 0731 02/26/24 0759 02/26/24 1147 02/26/24 1246  BP: 114/70  125/75   Pulse: 79  82   Resp: 16 20 16  (!) 21  Temp: 98.2 F (36.8 C)  99.2 F (37.3 C)   TempSrc: Oral  Oral   SpO2: 99% 99% 100% 98%  Weight:      Height:        Intake/Output from previous day:   Intake/Output Summary (Last 24 hours) at 02/26/2024 1315 Last data filed at 02/26/2024 0506 Gross per 24 hour  Intake 480 ml  Output 800 ml  Net -320 ml    Physical Exam: General: Alert, awake, oriented x3, in no acute distress.  HEENT: [place]/AT PEERL, EOMI Neck: Trachea midline,  no masses, no thyromegal,y no JVD, no carotid bruit OROPHARYNX:  Moist, No exudate/ erythema/lesions.  Heart: Regular rate and rhythm, without murmurs, rubs, gallops, PMI non-displaced, no heaves or thrills on palpation.  Lungs: Clear to auscultation, no wheezing or rhonchi noted. No increased vocal fremitus resonant to percussion  Abdomen: Soft, nontender, nondistended, positive bowel sounds, no masses no hepatosplenomegaly noted..  Neuro: No focal neurological deficits noted cranial nerves II through XII grossly intact. DTRs 2+ bilaterally upper and lower extremities. Strength 5 out of 5 in bilateral upper and lower extremities. Musculoskeletal: Right hip tenderness  psychiatric: Patient alert and oriented x3, good insight and cognition, good recent to remote recall. Lymph node survey: No cervical axillary or  inguinal lymphadenopathy noted.  Lab Results:  Basic Metabolic Panel:    Component Value Date/Time   NA 137 02/23/2024 1058   NA 139 08/22/2023 1405   K 4.3 02/23/2024 1058   CL 103 02/23/2024 1058   CO2 25 02/23/2024 1058   BUN 11 02/23/2024 1058   BUN 6 08/22/2023 1405   CREATININE 0.60 (L) 02/23/2024 1058   CREATININE 0.76 03/08/2017 1047   GLUCOSE 107 (H) 02/23/2024 1058   CALCIUM 9.2 02/23/2024 1058   CBC:    Component Value Date/Time   WBC 9.7 02/26/2024 0529   HGB 7.3 (L) 02/26/2024 0529   HGB 8.3 (L) 08/22/2023 1405   HCT 20.3 (L) 02/26/2024 0529   HCT 25.2 (L) 08/22/2023 1405   PLT 357 02/26/2024 0529   PLT 1,078 (HH) 08/22/2023 1405   MCV 86.8 02/26/2024 0529   MCV 86 08/22/2023 1405   NEUTROABS 5.6 02/23/2024 1058   NEUTROABS 6.0 08/22/2023 1405   LYMPHSABS 2.5 02/23/2024 1058   LYMPHSABS 2.2 08/22/2023 1405   MONOABS 0.6 02/23/2024 1058   EOSABS 0.3 02/23/2024 1058   EOSABS 0.2 08/22/2023 1405   BASOSABS 0.1 02/23/2024 1058   BASOSABS 0.1 08/22/2023 1405    No results found for this or any previous visit (from the past 240 hours).  Studies/Results: No results found.  Medications: Scheduled Meds:  cholecalciferol   1,000 Units Oral Daily   enoxaparin  (LOVENOX ) injection  40 mg Subcutaneous Q24H   folic acid   1 mg Oral Daily  HYDROmorphone    Intravenous Q4H   hydroxyurea   500 mg Oral BID   ketorolac   15 mg Intravenous Q6H   senna-docusate  1 tablet Oral BID   Continuous Infusions: PRN Meds:.diphenhydrAMINE , naloxone  **AND** sodium chloride  flush, ondansetron  (ZOFRAN ) IV, oxyCODONE -acetaminophen  **AND** oxyCODONE , polyethylene glycol  Consultants: None  Procedures: None  Antibiotics: None  Assessment/Plan: Principal Problem:   Sickle cell anemia with pain (HCC) Active Problems:   Chronic pain syndrome   Anemia of chronic disease   Leukocytosis   Hb Sickle Cell Disease with Pain crisis: Continue IVF 0.45% Saline at KVO, continue  weight based Dilaudid  PCA, IV Toradol  15 mg Q 6 H for a total of 5 days, continue oral home pain medications as ordered. Monitor vitals very closely, Re-evaluate pain scale regularly, 2 L of Oxygen by [place]. Patient encouraged to ambulate on the hallway today.  Leukocytosis: Slightly elevated.  No acute signs and symptoms of infection.  Will continue to monitor daily CBC. Anemia of Chronic Disease: Hemoglobin at 7.6 g/dl lower than patient's baseline.  No transfusion needed at this time.  Will continue to monitor daily CBC. Chronic pain Syndrome: Continue oral home pain medication.   Code Status: Full Code Family Communication: N/A Disposition Plan: Not yet ready for discharge  Homer CHRISTELLA Cover NP  If 7PM-7AM, please contact night-coverage.  02/26/2024, 1:15 PM  LOS: 3 days

## 2024-02-26 NOTE — Plan of Care (Signed)
  Problem: Education: Goal: Knowledge of vaso-occlusive preventative measures will improve Outcome: Progressing Goal: Awareness of infection prevention will improve Outcome: Progressing Goal: Awareness of signs and symptoms of anemia will improve Outcome: Progressing Goal: Long-term complications will improve Outcome: Progressing   Problem: Self-Care: Goal: Ability to incorporate actions that prevent/reduce pain crisis will improve Outcome: Progressing   Problem: Tissue Perfusion: Goal: Complications related to inadequate tissue perfusion will be avoided or minimized Outcome: Progressing   Problem: Respiratory: Goal: Pulmonary complications will be avoided or minimized Outcome: Progressing Goal: Acute Chest Syndrome will be identified early to prevent complications Outcome: Progressing   Problem: Sensory: Goal: Pain level will decrease with appropriate interventions Outcome: Progressing   Problem: Health Behavior: Goal: Postive changes in compliance with treatment and prescription regimens will improve Outcome: Progressing   Problem: Education: Goal: Knowledge of General Education information will improve Description: Including pain rating scale, medication(s)/side effects and non-pharmacologic comfort measures Outcome: Progressing   Problem: Clinical Measurements: Goal: Ability to maintain clinical measurements within normal limits will improve Outcome: Progressing Goal: Will remain free from infection Outcome: Progressing Goal: Diagnostic test results will improve Outcome: Progressing   Problem: Activity: Goal: Risk for activity intolerance will decrease Outcome: Progressing   Problem: Elimination: Goal: Will not experience complications related to bowel motility Outcome: Progressing   Problem: Pain Managment: Goal: General experience of comfort will improve and/or be controlled Outcome: Progressing   Problem: Safety: Goal: Ability to remain free from  injury will improve Outcome: Progressing

## 2024-02-27 NOTE — Discharge Summary (Signed)
 Physician Discharge Summary  [name] FMW:[id number] DOB: June 24, 1981 DOA: 02/23/2024  PCP: Oley Bascom RAMAN, NP  Admit date: 02/23/2024  Discharge date: 02/27/2024  Discharge Diagnoses:  Principal Problem:   Sickle cell anemia with pain (HCC) Active Problems:   Chronic pain syndrome   Anemia of chronic disease   Leukocytosis   Discharge Condition: Stable  Disposition:  Pt is discharged home in good condition and is to follow up with Oley Bascom RAMAN, NP this week to have labs evaluated. Nancyann Lesches is instructed to increase activity slowly and balance with rest for the next few days, and use prescribed medication to complete treatment of pain  Diet: Regular Wt Readings from Last 3 Encounters:  02/23/24 71 kg  11/20/23 70.3 kg  08/22/23 67.6 kg    History of present illness:   [name] is a 43 y.o. male with history of sickle cell disease chronic pain syndrome, opiate dependence and tolerance, and anemia of chronic disease presents with complaints of low back pain, bilateral hip pain that is consistent with his previous pain crisis. Patient has been taking home medications consistently without very much relief. He last had Percocet this a.m. Pain intensity is 9/10, constant, and aching. Patient currently denies any fever, chills, chest pain, urinary symptoms, nausea, vomiting, or diarrhea. No sick contacts,   Day Hospital cause:  Patient presented to the day hospital with uncontrolled pain. He was treated with IV Fluid, and IV pain medication with no resolution to pain symptoms. Patient was admitted for ongoing sickle cell pain management.   Hospital Course:  Patient was admitted for sickle cell pain crisis and managed appropriately with IVF, IV Dilaudid  via PCA and IV Toradol , as well as other adjunct therapies per sickle cell pain management protocols. Patient is reporting significant improvement of pain and rates it at 5/10. He has no new concerns. He is tolerating PO  without N/V, he is ambulating without assistance, he asked to be discharged home.  Patient was therefore discharged home today in a hemodynamically stable condition.   Fillmore will follow-up with PCP within 1 week of this discharge. Decarlos was counseled extensively about nonpharmacologic means of pain management, patient verbalized understanding and was appreciative of  the care received during this admission.   We discussed the need for good hydration, monitoring of hydration status, avoidance of heat, cold, stress, and infection triggers. We discussed the need to be adherent with taking other home medications. Patient was reminded of the need to seek medical attention immediately if any symptom of bleeding, anemia, or infection occurs.  Discharge Exam: Vitals:   02/27/24 0740 02/27/24 1020  BP:  138/79  Pulse:  82  Resp: 18 18  Temp:  98.3 F (36.8 C)  SpO2:  100%   Vitals:   02/27/24 0414 02/27/24 0631 02/27/24 0740 02/27/24 1020  BP:  122/72  138/79  Pulse:  87  82  Resp: (!) 23 18 18 18   Temp:  98.6 F (37 C)  98.3 F (36.8 C)  TempSrc:  Oral  Oral  SpO2:  100%  100%  Weight:      Height:        General appearance : Awake, alert, not in any distress. Speech Clear. Not toxic looking HEENT: Atraumatic and Normocephalic, pupils equally reactive to light and accomodation Neck: Supple, no JVD. No cervical lymphadenopathy.  Chest: Good air entry bilaterally, no added sounds  CVS: S1 S2 regular, no murmurs.  Abdomen: Bowel sounds present, Non tender and  not distended with no gaurding, rigidity or rebound. Extremities: B/L Lower Ext shows no edema, both legs are warm to touch Neurology: Awake alert, and oriented X 3, CN II-XII intact, Non focal Skin: No Rash  Discharge Instructions  Discharge Instructions     Call MD for:  severe uncontrolled pain   Complete by: As directed    Call MD for:  temperature >100.4   Complete by: As directed    Diet - low sodium heart healthy    Complete by: As directed    Increase activity slowly   Complete by: As directed       Allergies as of 02/27/2024   No Known Allergies      Medication List     TAKE these medications    aspirin  EC 81 MG tablet Take 1 tablet (81 mg total) by mouth daily. Swallow whole.   cholecalciferol  25 MCG (1000 UNIT) tablet Commonly known as: VITAMIN D3 Take 1,000 Units by mouth daily.   folic acid  400 MCG tablet Commonly known as: FOLVITE  Take 1 tablet (400 mcg total) by mouth daily.   hydroxyurea  500 MG capsule Commonly known as: HYDREA  Take 1 capsule (500 mg total) by mouth 2 (two) times daily. May take with food to minimize GI effects.   oxyCODONE -acetaminophen  10-325 MG tablet Commonly known as: PERCOCET Take 1 tablet by mouth every 6 hours as needed for pain.   Vitamin D  (Ergocalciferol ) 1.25 MG (50000 UNIT) Caps capsule Commonly known as: DRISDOL  Take 1 capsule (50,000 Units total) by mouth every 7 (seven) days.        The results of significant diagnostics from this hospitalization (including imaging, microbiology, ancillary and laboratory) are listed below for reference.    Significant Diagnostic Studies: No results found.  Microbiology: No results found for this or any previous visit (from the past 240 hours).   Labs: Basic Metabolic Panel: Recent Labs  Lab 02/23/24 1058  NA 137  K 4.3  CL 103  CO2 25  GLUCOSE 107*  BUN 11  CREATININE 0.60*  CALCIUM 9.2   Liver Function Tests: Recent Labs  Lab 02/23/24 1058  AST 37  ALT 23  ALKPHOS 73  BILITOT 2.8*  PROT 7.9  ALBUMIN 4.2   No results for input(s): LIPASE, AMYLASE in the last 168 hours. No results for input(s): AMMONIA in the last 168 hours. CBC: Recent Labs  Lab 02/23/24 1058 02/24/24 0714 02/25/24 0827 02/26/24 0529  WBC 9.5 14.4* 10.7* 9.7  NEUTROABS 5.6  --   --   --   HGB 8.8* 7.7* 7.6* 7.3*  HCT 24.7* 21.5* 21.2* 20.3*  MCV 87.0 85.7 86.9 86.8  PLT 494* 443* 379 357    Cardiac Enzymes: No results for input(s): CKTOTAL, CKMB, CKMBINDEX, TROPONINI in the last 168 hours. BNP: Invalid input(s): POCBNP CBG: No results for input(s): GLUCAP in the last 168 hours.  Time coordinating discharge: 50 minutes  Signed:  Homer CHRISTELLA Cover NP   [facility] 02/27/2024, 1:28 PM

## 2024-02-27 NOTE — Plan of Care (Signed)
  Problem: Clinical Measurements: Goal: Will remain free from infection Outcome: Progressing Goal: Diagnostic test results will improve Outcome: Progressing   Problem: Activity: Goal: Risk for activity intolerance will decrease Outcome: Progressing   Problem: Elimination: Goal: Will not experience complications related to bowel motility Outcome: Progressing Goal: Will not experience complications related to urinary retention Outcome: Progressing   Problem: Pain Managment: Goal: General experience of comfort will improve and/or be controlled Outcome: Progressing   Problem: Safety: Goal: Ability to remain free from injury will improve Outcome: Progressing

## 2024-02-28 ENCOUNTER — Telehealth: Payer: Self-pay

## 2024-02-28 NOTE — Transitions of Care (Post Inpatient/ED Visit) (Signed)
   02/28/2024  Name: [name] MRN: [id number] DOB: [birth year]  Today's TOC FU Call Status: Today's TOC FU Call Status:: Unsuccessful Call (1st Attempt) Unsuccessful Call (1st Attempt) Date: 02/28/24  Attempted to reach the patient regarding the most recent Inpatient/ED visit.  Follow Up Plan: Additional outreach attempts will be made to reach the patient to complete the Transitions of Care (Post Inpatient/ED visit) call.   Arvin Seip RN, BSN, CCM [employer], Population Health Case Manager Phone: [phone]

## 2024-02-29 ENCOUNTER — Telehealth: Payer: Self-pay

## 2024-02-29 NOTE — Transitions of Care (Post Inpatient/ED Visit) (Incomplete)
   02/29/2024  Name: [name] MRN: [id number] DOB: 04/02/1981  Today's TOC FU Call Status: Today's TOC FU Call Status:: Unsuccessful Call (2nd Attempt) Unsuccessful Call (2nd Attempt) Date: 02/29/24 Patient's Name and Date of Birth confirmed.  Transition Care Management Follow-up Telephone Call Date of Discharge: 02/27/24 Discharge Facility: [facility]) Type of Discharge: Inpatient Admission Primary Inpatient Discharge Diagnosis:: Sickle cell anemia with pain  Items Reviewed:    Medications Reviewed Today: Medications Reviewed Today   Medications were not reviewed in this encounter     Home Care and Equipment/Supplies:    Functional Questionnaire:    Follow up appointments reviewed:      SIGNATURE***

## 2024-02-29 NOTE — Transitions of Care (Post Inpatient/ED Visit) (Signed)
   02/29/2024  Name: [name] MRN: [id number] DOB: 1980-11-06  Today's TOC FU Call Status: Today's TOC FU Call Status:: Unsuccessful Call (2nd Attempt) Unsuccessful Call (2nd Attempt) Date: 02/29/24  Attempted to reach the patient regarding the most recent Inpatient/ED visit.  Follow Up Plan: Additional outreach attempts will be made to reach the patient to complete the Transitions of Care (Post Inpatient/ED visit) call.   Arvin Seip RN, BSN, CCM [employer], Population Health Case Manager Phone: [phone]

## 2024-03-01 ENCOUNTER — Telehealth: Payer: Self-pay

## 2024-03-01 NOTE — Transitions of Care (Post Inpatient/ED Visit) (Signed)
   03/01/2024  Name: [name] MRN: [id number] DOB: 1981/01/11  Today's TOC FU Call Status: Today's TOC FU Call Status:: Unsuccessful Call (3rd Attempt) Unsuccessful Call (1st Attempt) Date: 02/28/24 Unsuccessful Call (2nd Attempt) Date: 02/29/24 Unsuccessful Call (3rd Attempt) Date: 03/01/24  Attempted to reach the patient regarding the most recent Inpatient/ED visit.  Follow Up Plan: No further outreach attempts will be made at this time. We have been unable to contact the patient.  Medford Balboa, BSN, RN [place]  [facility] - [employer] [facility] RN Care Manager [phone]

## 2024-03-08 ENCOUNTER — Other Ambulatory Visit (HOSPITAL_COMMUNITY): Payer: Self-pay

## 2024-03-08 ENCOUNTER — Other Ambulatory Visit: Payer: Self-pay | Admitting: Nurse Practitioner

## 2024-03-08 DIAGNOSIS — D571 Sickle-cell disease without crisis: Secondary | ICD-10-CM

## 2024-03-08 MED ORDER — OXYCODONE-ACETAMINOPHEN 10-325 MG PO TABS
1.0000 | ORAL_TABLET | Freq: Four times a day (QID) | ORAL | 0 refills | Status: DC | PRN
Start: 2024-03-08 — End: 2024-03-25
  Filled 2024-03-08: qty 60, 15d supply, fill #0

## 2024-03-08 NOTE — Telephone Encounter (Signed)
 Please advise [facility]

## 2024-03-09 ENCOUNTER — Other Ambulatory Visit (HOSPITAL_COMMUNITY): Payer: Self-pay

## 2024-03-25 ENCOUNTER — Other Ambulatory Visit (HOSPITAL_COMMUNITY): Payer: Self-pay

## 2024-03-25 ENCOUNTER — Other Ambulatory Visit: Payer: Self-pay | Admitting: Nurse Practitioner

## 2024-03-25 DIAGNOSIS — D571 Sickle-cell disease without crisis: Secondary | ICD-10-CM

## 2024-03-26 ENCOUNTER — Other Ambulatory Visit (HOSPITAL_COMMUNITY): Payer: Self-pay

## 2024-03-26 MED ORDER — OXYCODONE-ACETAMINOPHEN 10-325 MG PO TABS
1.0000 | ORAL_TABLET | Freq: Four times a day (QID) | ORAL | 0 refills | Status: DC | PRN
Start: 2024-03-26 — End: 2024-04-18
  Filled 2024-03-26: qty 60, 15d supply, fill #0

## 2024-03-26 NOTE — Telephone Encounter (Signed)
 Please advise [facility]

## 2024-04-16 ENCOUNTER — Other Ambulatory Visit (HOSPITAL_COMMUNITY): Payer: Self-pay

## 2024-04-16 ENCOUNTER — Other Ambulatory Visit: Payer: Self-pay | Admitting: Nurse Practitioner

## 2024-04-16 DIAGNOSIS — D571 Sickle-cell disease without crisis: Secondary | ICD-10-CM

## 2024-04-18 ENCOUNTER — Other Ambulatory Visit: Payer: Self-pay

## 2024-04-18 ENCOUNTER — Telehealth: Payer: Self-pay | Admitting: Nurse Practitioner

## 2024-04-18 ENCOUNTER — Other Ambulatory Visit (HOSPITAL_COMMUNITY): Payer: Self-pay

## 2024-04-18 DIAGNOSIS — D571 Sickle-cell disease without crisis: Secondary | ICD-10-CM

## 2024-04-18 NOTE — Telephone Encounter (Signed)
 Sent to pcp [employer]

## 2024-04-18 NOTE — Telephone Encounter (Signed)
 Please advise [facility]

## 2024-04-18 NOTE — Telephone Encounter (Unsigned)
 Copied from CRM [id number]. Topic: Clinical - Medication Refill >> Apr 18, 2024 12:20 PM Tiffany S wrote: Medication: oxyCODONE -acetaminophen  (PERCOCET) 10-325 MG tablet [[id number]]  Has the patient contacted their pharmacy? Yes (Agent: If no, request that the patient contact the pharmacy for the refill. If patient does not wish to contact the pharmacy document the reason why and proceed with request.) (Agent: If yes, when and what did the pharmacy advise?)  This is the patient's preferred pharmacy:   [facility] [address] [place] [address] Phone: [phone] Fax: [phone]  Is this the correct pharmacy for this prescription? Yes If no, delete pharmacy and type the correct one.   Has the prescription been filled recently? Yes  Is the patient out of the medication? Yes  Has the patient been seen for an appointment in the last year OR does the patient have an upcoming appointment? Yes  Can we respond through MyChart? Yes  Agent: Please be advised that Rx refills may take up to 3 business days. We ask that you follow-up with your pharmacy.

## 2024-04-19 ENCOUNTER — Other Ambulatory Visit (HOSPITAL_COMMUNITY): Payer: Self-pay

## 2024-04-19 MED ORDER — OXYCODONE-ACETAMINOPHEN 10-325 MG PO TABS: 1.0000 | ORAL_TABLET | Freq: Four times a day (QID) | ORAL | 0 refills | Status: DC | PRN

## 2024-04-19 NOTE — Telephone Encounter (Signed)
 Copied from CRM [id number]. Topic: Clinical - Medication Question >> Apr 19, 2024  9:56 AM Tonda B wrote: Reason for CRM: pt is out of his rx oxyCODONE -acetaminophen  (PERCOCET) 10-325 and has an appt on 09/18 he wants to know if he can get a few pills to last him until his appt please call pt back [phone] (M)

## 2024-04-19 NOTE — Telephone Encounter (Signed)
 Pt has appt 05/02/24. [name]

## 2024-05-01 ENCOUNTER — Other Ambulatory Visit (HOSPITAL_COMMUNITY): Payer: Self-pay

## 2024-05-01 ENCOUNTER — Other Ambulatory Visit: Payer: Self-pay | Admitting: Nurse Practitioner

## 2024-05-01 DIAGNOSIS — D571 Sickle-cell disease without crisis: Secondary | ICD-10-CM

## 2024-05-01 NOTE — Telephone Encounter (Signed)
 Please advise [facility]

## 2024-05-02 ENCOUNTER — Other Ambulatory Visit (HOSPITAL_COMMUNITY): Payer: Self-pay

## 2024-05-02 ENCOUNTER — Encounter: Payer: Self-pay | Admitting: Nurse Practitioner

## 2024-05-02 ENCOUNTER — Telehealth: Payer: Self-pay | Admitting: Nurse Practitioner

## 2024-05-02 DIAGNOSIS — D571 Sickle-cell disease without crisis: Secondary | ICD-10-CM | POA: Diagnosis not present

## 2024-05-02 MED ORDER — OXYCODONE-ACETAMINOPHEN 10-325 MG PO TABS
1.0000 | ORAL_TABLET | Freq: Four times a day (QID) | ORAL | 0 refills | Status: DC | PRN
  Filled 2024-05-03: qty 60, 15d supply, fill #0

## 2024-05-02 NOTE — Progress Notes (Signed)
 Virtual Visit via Video Note  I connected with [name] on 05/02/24 at  2:40 PM EDT by a video enabled telemedicine application and verified that I am speaking with the correct person using two identifiers.  Location: Patient: home Provider: office   I discussed the limitations of evaluation and management by telemedicine and the availability of in person appointments. The patient expressed understanding and agreed to proceed.  History of Present Illness:  Patient presents today for sickle cell follow-up.  This is a video visit because patient is currently in [place] .  Patient does need refills on pain medication.  He was recently in the hospital for sickle cell crisis and states that he has been doing well since discharge. Denies f/c/s, n/v/d, hemoptysis, PND, leg swelling Denies chest pain or edema      Observations/Objective:     02/27/2024   10:20 AM 02/27/2024    6:31 AM 02/27/2024   12:56 AM  Vitals with BMI  Systolic 138 122 879  Diastolic 79 72 74  Pulse 82 87 88      Assessment and Plan:  1. Hb-SS disease without crisis - oxyCODONE -acetaminophen  (PERCOCET) 10-325 MG tablet; Take 1 tablet by mouth every 6 hours as needed for pain.  Dispense: 60 tablet; Refill: 0  Follow up:  Follow up in 3 months   I discussed the assessment and treatment plan with the patient. The patient was provided an opportunity to ask questions and all were answered. The patient agreed with the plan and demonstrated an understanding of the instructions.   The patient was advised to call back or seek an in-person evaluation if the symptoms worsen or if the condition fails to improve as anticipated.  I provided 23 minutes of non-face-to-face time during this encounter.   Bascom GORMAN Borer, NP

## 2024-05-03 ENCOUNTER — Other Ambulatory Visit (HOSPITAL_COMMUNITY): Payer: Self-pay

## 2024-05-03 ENCOUNTER — Other Ambulatory Visit: Payer: Self-pay

## 2024-05-04 ENCOUNTER — Other Ambulatory Visit (HOSPITAL_COMMUNITY): Payer: Self-pay

## 2024-05-15 ENCOUNTER — Other Ambulatory Visit: Payer: Self-pay | Admitting: Nurse Practitioner

## 2024-05-15 ENCOUNTER — Other Ambulatory Visit (HOSPITAL_COMMUNITY): Payer: Self-pay

## 2024-05-15 DIAGNOSIS — D571 Sickle-cell disease without crisis: Secondary | ICD-10-CM

## 2024-05-15 MED ORDER — OXYCODONE-ACETAMINOPHEN 10-325 MG PO TABS
1.0000 | ORAL_TABLET | Freq: Four times a day (QID) | ORAL | 0 refills | Status: DC | PRN
  Filled 2024-05-17: qty 60, 15d supply, fill #0

## 2024-05-15 NOTE — Telephone Encounter (Signed)
 LOV 05/02/24 VIDEO VISIT Next OV NOT SCHEDULED Last refill 05/03/24, #60, 0 refills  Please review, thanks!

## 2024-05-16 ENCOUNTER — Other Ambulatory Visit (HOSPITAL_COMMUNITY): Payer: Self-pay

## 2024-05-17 ENCOUNTER — Other Ambulatory Visit (HOSPITAL_COMMUNITY): Payer: Self-pay

## 2024-05-17 ENCOUNTER — Other Ambulatory Visit: Payer: Self-pay

## 2024-05-28 ENCOUNTER — Other Ambulatory Visit: Payer: Self-pay | Admitting: Nurse Practitioner

## 2024-05-28 ENCOUNTER — Other Ambulatory Visit (HOSPITAL_COMMUNITY): Payer: Self-pay

## 2024-05-28 DIAGNOSIS — D571 Sickle-cell disease without crisis: Secondary | ICD-10-CM

## 2024-05-29 ENCOUNTER — Other Ambulatory Visit (HOSPITAL_COMMUNITY): Payer: Self-pay

## 2024-05-29 MED ORDER — OXYCODONE-ACETAMINOPHEN 10-325 MG PO TABS
1.0000 | ORAL_TABLET | Freq: Four times a day (QID) | ORAL | 0 refills | Status: DC | PRN
  Filled 2024-05-31: qty 60, 15d supply, fill #0

## 2024-05-31 ENCOUNTER — Other Ambulatory Visit: Payer: Self-pay

## 2024-06-10 ENCOUNTER — Other Ambulatory Visit: Payer: Self-pay | Admitting: Nurse Practitioner

## 2024-06-10 DIAGNOSIS — D571 Sickle-cell disease without crisis: Secondary | ICD-10-CM

## 2024-06-11 NOTE — Telephone Encounter (Signed)
 Please advise [facility]

## 2024-06-12 ENCOUNTER — Other Ambulatory Visit (HOSPITAL_COMMUNITY): Payer: Self-pay

## 2024-06-12 ENCOUNTER — Other Ambulatory Visit (HOSPITAL_BASED_OUTPATIENT_CLINIC_OR_DEPARTMENT_OTHER): Payer: Self-pay

## 2024-06-12 MED ORDER — HYDROXYUREA 500 MG PO CAPS
500.0000 mg | ORAL_CAPSULE | Freq: Two times a day (BID) | ORAL | 1 refills | Status: AC
Start: 2024-06-12 — End: ?
  Filled 2024-06-12: qty 60, 30d supply, fill #0
  Filled 2024-06-21 (×3): qty 180, 90d supply, fill #0

## 2024-06-13 ENCOUNTER — Other Ambulatory Visit: Payer: Self-pay

## 2024-06-13 ENCOUNTER — Encounter: Payer: Self-pay | Admitting: Pharmacist

## 2024-06-18 ENCOUNTER — Other Ambulatory Visit: Payer: Self-pay | Admitting: Nurse Practitioner

## 2024-06-18 ENCOUNTER — Other Ambulatory Visit (HOSPITAL_COMMUNITY): Payer: Self-pay

## 2024-06-18 ENCOUNTER — Other Ambulatory Visit: Payer: Self-pay

## 2024-06-18 DIAGNOSIS — D571 Sickle-cell disease without crisis: Secondary | ICD-10-CM

## 2024-06-18 NOTE — Telephone Encounter (Signed)
 Please advise [facility]

## 2024-06-19 ENCOUNTER — Other Ambulatory Visit (HOSPITAL_COMMUNITY): Payer: Self-pay

## 2024-06-19 MED ORDER — OXYCODONE-ACETAMINOPHEN 10-325 MG PO TABS
1.0000 | ORAL_TABLET | Freq: Four times a day (QID) | ORAL | 0 refills | Status: DC | PRN
  Filled 2024-06-19: qty 60, 15d supply, fill #0

## 2024-06-21 ENCOUNTER — Other Ambulatory Visit: Payer: Self-pay

## 2024-06-21 ENCOUNTER — Other Ambulatory Visit (HOSPITAL_COMMUNITY): Payer: Self-pay

## 2024-07-02 ENCOUNTER — Other Ambulatory Visit: Payer: Self-pay

## 2024-07-03 ENCOUNTER — Other Ambulatory Visit: Payer: Self-pay | Admitting: Nurse Practitioner

## 2024-07-03 ENCOUNTER — Other Ambulatory Visit (HOSPITAL_COMMUNITY): Payer: Self-pay

## 2024-07-03 DIAGNOSIS — D571 Sickle-cell disease without crisis: Secondary | ICD-10-CM

## 2024-07-03 MED ORDER — OXYCODONE-ACETAMINOPHEN 10-325 MG PO TABS
1.0000 | ORAL_TABLET | Freq: Four times a day (QID) | ORAL | 0 refills | Status: DC | PRN
  Filled 2024-07-03: qty 60, 15d supply, fill #0

## 2024-07-03 NOTE — Telephone Encounter (Signed)
 Please advise [facility]

## 2024-07-04 ENCOUNTER — Other Ambulatory Visit: Payer: Self-pay

## 2024-07-04 ENCOUNTER — Other Ambulatory Visit (HOSPITAL_COMMUNITY): Payer: Self-pay

## 2024-07-23 ENCOUNTER — Other Ambulatory Visit: Payer: Self-pay | Admitting: Nurse Practitioner

## 2024-07-23 ENCOUNTER — Other Ambulatory Visit (HOSPITAL_COMMUNITY): Payer: Self-pay

## 2024-07-23 DIAGNOSIS — D571 Sickle-cell disease without crisis: Secondary | ICD-10-CM

## 2024-07-23 NOTE — Telephone Encounter (Signed)
 Please advise [facility]

## 2024-07-24 MED ORDER — OXYCODONE-ACETAMINOPHEN 10-325 MG PO TABS
1.0000 | ORAL_TABLET | Freq: Four times a day (QID) | ORAL | 0 refills | Status: DC | PRN
  Filled 2024-07-24: qty 60, 15d supply, fill #0

## 2024-07-25 ENCOUNTER — Telehealth: Payer: Self-pay

## 2024-07-25 ENCOUNTER — Other Ambulatory Visit (HOSPITAL_COMMUNITY): Payer: Self-pay

## 2024-07-25 NOTE — Telephone Encounter (Signed)
 Copied from CRM #[id number]. Topic: Clinical - Medical Advice >> Jul 25, 2024  8:57 AM Myrick T wrote: Reason for CRM: Jasmine patients girlfriend call to see if patients hospital care plan could be faxed to [phone] attn:[facility]. Please include on the cover sheet his first/last name, room#3247 and a note stating that is his hospital care plan.   Last ED notes were sent . [facility]

## 2024-07-26 ENCOUNTER — Other Ambulatory Visit (HOSPITAL_COMMUNITY): Payer: Self-pay

## 2024-07-26 MED ORDER — METHOCARBAMOL 500 MG PO TABS
500.0000 mg | ORAL_TABLET | Freq: Three times a day (TID) | ORAL | 0 refills | Status: AC | PRN
  Filled 2024-07-26: qty 30, 10d supply, fill #0

## 2024-07-26 MED ORDER — OXYCODONE-ACETAMINOPHEN 10-325 MG PO TABS
0.5000 | ORAL_TABLET | Freq: Four times a day (QID) | ORAL | 0 refills | Status: DC | PRN
  Filled 2024-07-26: qty 20, 10d supply, fill #0

## 2024-07-26 MED ORDER — GABAPENTIN 300 MG PO CAPS
300.0000 mg | ORAL_CAPSULE | Freq: Three times a day (TID) | ORAL | 0 refills | Status: AC | PRN
  Filled 2024-07-26: qty 30, 10d supply, fill #0

## 2024-07-28 ENCOUNTER — Other Ambulatory Visit (HOSPITAL_COMMUNITY): Payer: Self-pay

## 2024-07-30 ENCOUNTER — Ambulatory Visit: Payer: Self-pay

## 2024-08-05 ENCOUNTER — Telehealth: Payer: Self-pay | Admitting: Nurse Practitioner

## 2024-08-05 NOTE — Telephone Encounter (Unsigned)
 Copied from CRM [id number]. Topic: Clinical - Refused Triage >> Aug 05, 2024  3:03 PM Antwanette L wrote: Patient called to schedule a hospital follow up. He was discharged on 12/16 from [facility]. During the decision tree screening, the patient reported sickle cell pain in his lower back but decline to speak with NT.

## 2024-08-06 NOTE — Telephone Encounter (Signed)
 Pt has appt 08/13/24 for HFU. [name]

## 2024-08-13 ENCOUNTER — Encounter: Payer: Self-pay | Admitting: Nurse Practitioner

## 2024-08-13 ENCOUNTER — Other Ambulatory Visit (HOSPITAL_COMMUNITY): Payer: Self-pay

## 2024-08-13 ENCOUNTER — Ambulatory Visit (INDEPENDENT_AMBULATORY_CARE_PROVIDER_SITE_OTHER): Payer: Self-pay | Admitting: Nurse Practitioner

## 2024-08-13 VITALS — BP 112/64 | HR 78 | Temp 97.1°F | Wt 148.8 lb

## 2024-08-13 DIAGNOSIS — E559 Vitamin D deficiency, unspecified: Secondary | ICD-10-CM | POA: Diagnosis not present

## 2024-08-13 DIAGNOSIS — D5700 Hb-SS disease with crisis, unspecified: Secondary | ICD-10-CM | POA: Diagnosis not present

## 2024-08-13 DIAGNOSIS — Z0289 Encounter for other administrative examinations: Secondary | ICD-10-CM | POA: Insufficient documentation

## 2024-08-13 DIAGNOSIS — G894 Chronic pain syndrome: Secondary | ICD-10-CM

## 2024-08-13 LAB — POCT URINALYSIS DIP (CLINITEK)
Bilirubin, UA: NEGATIVE
Blood, UA: NEGATIVE
Glucose, UA: NEGATIVE mg/dL
Ketones, POC UA: NEGATIVE mg/dL
POC PROTEIN,UA: NEGATIVE
Spec Grav, UA: 1.015
pH, UA: 7

## 2024-08-13 MED ORDER — OXYCODONE-ACETAMINOPHEN 10-325 MG PO TABS
0.5000 | ORAL_TABLET | Freq: Four times a day (QID) | ORAL | 0 refills | Status: DC | PRN
  Filled 2024-08-13: qty 30, 8d supply, fill #0

## 2024-08-13 MED ORDER — VITAMIN D (ERGOCALCIFEROL) 1.25 MG (50000 UNIT) PO CAPS
50000.0000 [IU] | ORAL_CAPSULE | ORAL | 1 refills | Status: AC
  Filled 2024-08-13: qty 8, 56d supply, fill #0

## 2024-08-13 MED ORDER — FOLIC ACID 1 MG PO TABS
1.0000 mg | ORAL_TABLET | Freq: Every day | ORAL | 1 refills | Status: AC
  Filled 2024-08-13: qty 90, 90d supply, fill #0

## 2024-08-13 NOTE — Patient Instructions (Addendum)
 1. Sickle cell anemia with pain (HCC) (Primary) - Sickle Cell Panel - POCT URINALYSIS DIP (CLINITEK) - ToxAssure Flex 15, Ur - oxyCODONE -acetaminophen  (PERCOCET) 10-325 MG tablet; Take 1/2 tablet to 1 tablet every 6 hours by mouth as needed for moderate to severe pain  Dispense: 30 tablet; Refill: 0  2. Vitamin D  deficiency - Vitamin D , Ergocalciferol , (DRISDOL ) 1.25 MG (50000 UNIT) CAPS capsule; Take 1 capsule (50,000 Units total) by mouth every 7 (seven) days.  Dispense: 8 capsule; Refill: 1  3. Hb-SS disease without crisis (HCC)  4. Chronic pain syndrome - oxyCODONE -acetaminophen  (PERCOCET) 10-325 MG tablet; Take 1/2 tablet to 1 tablet every 6 hours by mouth as needed for moderate to severe pain  Dispense: 30 tablet; Refill: 0    Please call the ophthalmologist at [phone] in about 2 to 3 weeks to schedule an appointment if you do not hear from them   It is important that you exercise regularly at least 30 minutes 5 times a week as tolerated  Think about what you will eat, plan ahead. Choose  clean, green, fresh or frozen over canned, processed or packaged foods which are more sugary, salty and fatty. 70 to 75% of food eaten should be vegetables and fruit. Three meals at set times with snacks allowed between meals, but they must be fruit or vegetables. Aim to eat over a 12 hour period , example 7 am to 7 pm, and STOP after  your last meal of the day. Drink water,generally about 64 ounces per day, no other drink is as healthy. Fruit juice is best enjoyed in a healthy way, by EATING the fruit.  Thanks for choosing [facility] we consider it a privelige to serve you.

## 2024-08-13 NOTE — Assessment & Plan Note (Signed)
 Last vitamin D  Lab Results  Component Value Date   VD25OH 10.2 (L) 08/22/2023    Vitamin D  deficiency Noted deficiency. He has not been taking supplements recently. - Prescribed vitamin D  50,000 units once weekly

## 2024-08-13 NOTE — Progress Notes (Signed)
 "  Established Patient Office Visit  Subjective:  Patient ID: [name], male    DOB: 1981/01/22  Age: 43 y.o. MRN: [id number]  CC:  Chief Complaint  Patient presents with   Hospitalization Follow-up    No concerns today.     HPI  Discussed the use of AI scribe software for clinical note transcription with the patient, who gave verbal consent to proceed.  History of Present Illness [name] is a 43 year old male  has a past medical history of Cancer (HCC) and Sickle cell anemia (HCC).  who presents for a regular follow-up visit for his chronic medical conditions.  He manages his sickle cell disease with hydroxyurea  500 mg twice daily. For pain management, he takes Percocet 10/325 mg, usually half a pill as needed, to avoid excessive sedation. His pain is a constant presence, with a current level of 1 out of 10, escalating to 8 when more noticeable, and reaching 10 during severe episodes. He recently experienced a pain crisis requiring a week-long hospitalization in [place] , which he anticipated due to running out of pain medication. He took his last pain pill this morning and anticipates needing a refill soon.  He avoids ibuprofen  due to adverse effects such as headaches and is not currently taking folic acid  or vitamin D , as he needs to purchase more. He maintains a healthy diet, avoiding red meat, and reports regular bowel movements without constipation. He denies the use of recreational drugs, including marijuana and cocaine , and does not smoke. He stays hydrated, aiming for 64 ounces of water daily.    Assessment & Plan           Past Medical History:  Diagnosis Date   Cancer (HCC)    Sickle cell anemia (HCC)     Past Surgical History:  Procedure Laterality Date   CHOLECYSTECTOMY      Family History  Problem Relation Age of Onset   Sickle cell anemia Mother    Diabetes Mother    Sickle cell trait Father    Sickle cell trait Sister    Sickle cell trait  Brother    Cancer Maternal Grandmother     Social History   Socioeconomic History   Marital status: Single    Spouse name: Not on file   Number of children: Not on file   Years of education: Not on file   Highest education level: Associate degree: occupational, scientist, product/process development, or vocational program  Occupational History   Not on file  Tobacco Use   Smoking status: Never   Smokeless tobacco: Never  Vaping Use   Vaping status: Never Used  Substance and Sexual Activity   Alcohol use: No   Drug use: No   Sexual activity: Not Currently  Other Topics Concern   Not on file  Social History Narrative   Pt lives at home with girlfriend.   Social Drivers of Health   Tobacco Use: Low Risk (08/13/2024)   Patient History    Smoking Tobacco Use: Never    Smokeless Tobacco Use: Never    Passive Exposure: Not on file  Financial Resource Strain: Low Risk (08/25/2022)   Overall Financial Resource Strain (CARDIA)    Difficulty of Paying Living Expenses: Not hard at all  Food Insecurity: No Food Insecurity (08/13/2024)   Epic    Worried About Programme Researcher, Broadcasting/film/video in the Last Year: Never true    Ran Out of Food in the Last Year: Never true  Transportation Needs: No  Transportation Needs (08/13/2024)   Epic    Lack of Transportation (Medical): No    Lack of Transportation (Non-Medical): No  Physical Activity: Inactive (08/25/2022)   Exercise Vital Sign    Days of Exercise per Week: 0 days    Minutes of Exercise per Session: 0 min  Stress: No Stress Concern Present (08/25/2022)   [employer] of Occupational Health - Occupational Stress Questionnaire    Feeling of Stress : Not at all  Social Connections: Socially Integrated (08/25/2022)   Social Connection and Isolation Panel    Frequency of Communication with Friends and Family: More than three times a week    Frequency of Social Gatherings with Friends and Family: More than three times a week    Attends Religious Services: 1 to 4 times  per year    Active Member of [employer] or Organizations: No    Attends Banker Meetings: 1 to 4 times per year    Marital Status: Living with partner  Intimate Partner Violence: Not At Risk (08/13/2024)   Epic    Fear of Current or Ex-Partner: No    Emotionally Abused: No    Physically Abused: No    Sexually Abused: No  Depression (PHQ2-9): Low Risk (08/13/2024)   Depression (PHQ2-9)    PHQ-2 Score: 0  Alcohol Screen: Low Risk (08/25/2022)   Alcohol Screen    Last Alcohol Screening Score (AUDIT): 0  Housing: Low Risk (08/13/2024)   Epic    Unable to Pay for Housing in the Last Year: No    Number of Times Moved in the Last Year: 0    Homeless in the Last Year: No  Utilities: Not At Risk (08/13/2024)   Epic    Threatened with loss of utilities: No  Health Literacy: Not on file    Outpatient Medications Prior to Visit  Medication Sig Dispense Refill   cholecalciferol  (VITAMIN D3) 25 MCG (1000 UNIT) tablet Take 1,000 Units by mouth daily.     hydroxyurea  (HYDREA ) 500 MG capsule Take 1 capsule (500 mg total) by mouth 2 (two) times daily. May take with food to minimize GI effects. 360 capsule 1   oxyCODONE -acetaminophen  (PERCOCET) 10-325 MG tablet Take 1 tablet by mouth every 6 hours as needed for pain. 60 tablet 0   oxyCODONE -acetaminophen  (PERCOCET) 10-325 MG tablet Take 1/2 tablet by mouth 4 (four) times daily as needed for acute pain exception for 10 days. 20 tablet 0   aspirin  EC 81 MG tablet Take 1 tablet (81 mg total) by mouth daily. Swallow whole. (Patient not taking: Reported on 08/13/2024) 30 tablet 12   gabapentin  (NEURONTIN ) 300 MG capsule Take 1 capsule (300 mg total) by mouth 3 (three) times daily as needed for neuropathy. (Patient not taking: Reported on 08/13/2024) 30 capsule 0   methocarbamol  (ROBAXIN ) 500 MG tablet Take 1 tablet (500 mg total) by mouth every 8 (eight) hours as needed for muscle spasm. (Patient not taking: Reported on 08/13/2024) 30 tablet 0    folic acid  (FOLVITE ) 400 MCG tablet Take 1 tablet (400 mcg total) by mouth daily. (Patient not taking: Reported on 08/13/2024) 30 tablet 2   Vitamin D , Ergocalciferol , (DRISDOL ) 1.25 MG (50000 UNIT) CAPS capsule Take 1 capsule (50,000 Units total) by mouth every 7 (seven) days. (Patient not taking: Reported on 08/13/2024) 4 capsule 0   No facility-administered medications prior to visit.    Allergies[1]  ROS Review of Systems  Constitutional:  Negative for appetite change, chills, fatigue and  fever.  HENT:  Negative for congestion, postnasal drip, rhinorrhea and sneezing.   Respiratory:  Negative for cough, shortness of breath and wheezing.   Cardiovascular:  Negative for chest pain, palpitations and leg swelling.  Gastrointestinal:  Negative for abdominal pain, constipation, nausea and vomiting.  Genitourinary:  Negative for difficulty urinating, dysuria, flank pain and frequency.  Musculoskeletal:  Positive for arthralgias. Negative for joint swelling and myalgias.  Skin:  Negative for color change, pallor, rash and wound.  Neurological:  Negative for dizziness, facial asymmetry, weakness, numbness and headaches.  Psychiatric/Behavioral:  Negative for behavioral problems, confusion, self-injury and suicidal ideas.       Objective:    Physical Exam Vitals and nursing note reviewed.  Constitutional:      General: He is not in acute distress.    Appearance: Normal appearance. He is not ill-appearing, toxic-appearing or diaphoretic.  HENT:     Mouth/Throat:     Mouth: Mucous membranes are moist.     Pharynx: Oropharynx is clear. No oropharyngeal exudate or posterior oropharyngeal erythema.  Eyes:     General: No scleral icterus.       Right eye: No discharge.        Left eye: No discharge.     Extraocular Movements: Extraocular movements intact.     Conjunctiva/sclera: Conjunctivae normal.  Cardiovascular:     Rate and Rhythm: Normal rate and regular rhythm.     Pulses: Normal  pulses.     Heart sounds: Normal heart sounds. No murmur heard.    No friction rub. No gallop.  Pulmonary:     Effort: Pulmonary effort is normal. No respiratory distress.     Breath sounds: Normal breath sounds. No stridor. No wheezing, rhonchi or rales.  Chest:     Chest wall: No tenderness.  Abdominal:     General: There is no distension.     Palpations: Abdomen is soft.     Tenderness: There is no abdominal tenderness. There is no right CVA tenderness, left CVA tenderness or guarding.  Musculoskeletal:        General: No swelling, tenderness, deformity or signs of injury.     Right lower leg: No edema.     Left lower leg: No edema.  Skin:    General: Skin is warm and dry.     Capillary Refill: Capillary refill takes less than 2 seconds.     Coloration: Skin is not jaundiced or pale.     Findings: No bruising, erythema or lesion.  Neurological:     Mental Status: He is alert and oriented to person, place, and time.     Motor: No weakness.     Gait: Gait normal.  Psychiatric:        Mood and Affect: Mood normal.        Behavior: Behavior normal.        Thought Content: Thought content normal.        Judgment: Judgment normal.     BP 112/64 (BP Location: Right Arm, Patient Position: Sitting, Cuff Size: Normal)   Pulse 78   Temp (!) 97.1 F (36.2 C) (Temporal)   Wt 148 lb 12.8 oz (67.5 kg)   SpO2 99%   BMI 19.63 kg/m  Wt Readings from Last 3 Encounters:  08/13/24 148 lb 12.8 oz (67.5 kg)  02/23/24 156 lb 8 oz (71 kg)  11/20/23 155 lb (70.3 kg)    Lab Results  Component Value Date   TSH 1.122 09/05/2010  Lab Results  Component Value Date   WBC 9.7 02/26/2024   HGB 7.3 (L) 02/26/2024   HCT 20.3 (L) 02/26/2024   MCV 86.8 02/26/2024   PLT 357 02/26/2024   Lab Results  Component Value Date   NA 137 02/23/2024   K 4.3 02/23/2024   CO2 25 02/23/2024   GLUCOSE 107 (H) 02/23/2024   BUN 11 02/23/2024   CREATININE 0.60 (L) 02/23/2024   BILITOT 2.8 (H)  02/23/2024   ALKPHOS 73 02/23/2024   AST 37 02/23/2024   ALT 23 02/23/2024   PROT 7.9 02/23/2024   ALBUMIN 4.2 02/23/2024   CALCIUM 9.2 02/23/2024   ANIONGAP 9 02/23/2024   EGFR 117 08/22/2023   No results found for: CHOL No results found for: HDL No results found for: LDLCALC No results found for: TRIG No results found for: CHOLHDL No results found for: YHAJ8R    Assessment & Plan:   Problem List Items Addressed This Visit       Other   Chronic pain syndrome   Relevant Medications   oxyCODONE -acetaminophen  (PERCOCET) 10-325 MG tablet   Other Relevant Orders   ToxAssure Flex 15, Ur   Hb-SS disease without crisis (HCC)   Relevant Medications   folic acid  (FOLVITE ) 1 MG tablet   Sickle cell anemia with pain (HCC) - Primary   We discussed the need for good hydration, monitoring of hydration status, avoidance of heat, cold, stress, and infection triggers. We discussed the need to be adherent with taking Hydrea  and other home medications. Patient was reminded of the need to seek medical attention immediately if any symptom of bleeding, anemia, or infection occurs.   Continue Percocet 10 325 mg, 0.5 to 1 tablet every 6 hours as needed moderate to severe pain I reminded the patient that all patients receiving Schedule II narcotics must be seen for follow within the 3 months in the office.  We reviewed the terms of our pain agreement, including the need to keep medicines in a safe locked location away from children or pets, and the need to report excess sedation or constipation, measures to avoid constipation, and policies related to early refills and stolen prescriptions. According to the [place] [facility] program, we have reviewed details related to analgesia, adverse effects, aberrant behaviors.   Pain contract agreement form signed today Folic acid  1 mg daily refilled UA negative for protein  General health maintenance Routine health maintenance discussed,  including eye exams and flu vaccination. He is hesitant about flu shot due to past experiences. - Referred to an eye doctor for annual retinopathy screening. - Encouraged consideration of flu vaccination.      Relevant Medications   oxyCODONE -acetaminophen  (PERCOCET) 10-325 MG tablet   Other Relevant Orders   Sickle Cell Panel   POCT URINALYSIS DIP (CLINITEK) (Completed)   ToxAssure Flex 15, Ur   Ambulatory referral to Ophthalmology   Vitamin D  deficiency   Last vitamin D  Lab Results  Component Value Date   VD25OH 10.2 (L) 08/22/2023    Vitamin D  deficiency Noted deficiency. He has not been taking supplements recently. - Prescribed vitamin D  50,000 units once weekly        Relevant Medications   Vitamin D , Ergocalciferol , (DRISDOL ) 1.25 MG (50000 UNIT) CAPS capsule   Pain management contract signed   Pain management contract signed today       Meds ordered this encounter  Medications   folic acid  (FOLVITE ) 1 MG tablet    Sig: Take 1  tablet (1 mg total) by mouth daily.    Dispense:  90 tablet    Refill:  1   Vitamin D , Ergocalciferol , (DRISDOL ) 1.25 MG (50000 UNIT) CAPS capsule    Sig: Take 1 capsule (50,000 Units total) by mouth every 7 (seven) days.    Dispense:  8 capsule    Refill:  1   oxyCODONE -acetaminophen  (PERCOCET) 10-325 MG tablet    Sig: Take 0.5-1 tablets by mouth every 6 (six) hours as needed for moderate to severe pain    Dispense:  30 tablet    Refill:  0    Follow-up: Return in about 3 months (around 11/11/2024), or SCD, for CPE.    Tomeko Scoville R Bilaal Leib, FNP     [1] No Known Allergies  "

## 2024-08-13 NOTE — Assessment & Plan Note (Addendum)
 We discussed the need for good hydration, monitoring of hydration status, avoidance of heat, cold, stress, and infection triggers. We discussed the need to be adherent with taking Hydrea  and other home medications. Patient was reminded of the need to seek medical attention immediately if any symptom of bleeding, anemia, or infection occurs.   Continue Percocet 10 325 mg, 0.5 to 1 tablet every 6 hours as needed moderate to severe pain I reminded the patient that all patients receiving Schedule II narcotics must be seen for follow within the 3 months in the office.  We reviewed the terms of our pain agreement, including the need to keep medicines in a safe locked location away from children or pets, and the need to report excess sedation or constipation, measures to avoid constipation, and policies related to early refills and stolen prescriptions. According to the  Chronic Pain Initiative program, we have reviewed details related to analgesia, adverse effects, aberrant behaviors.   Pain contract agreement form signed today Folic acid  1 mg daily refilled UA negative for protein  General health maintenance Routine health maintenance discussed, including eye exams and flu vaccination. He is hesitant about flu shot due to past experiences. - Referred to an eye doctor for annual retinopathy screening. - Encouraged consideration of flu vaccination.

## 2024-08-13 NOTE — Assessment & Plan Note (Signed)
 Pain management contract signed today

## 2024-08-14 LAB — CMP14+CBC/D/PLT+FER+RETIC+V...
ALT: 17 IU/L (ref 0–44)
AST: 30 IU/L (ref 0–40)
Albumin: 4.2 g/dL (ref 4.1–5.1)
Alkaline Phosphatase: 133 IU/L — ABNORMAL HIGH (ref 47–123)
BUN/Creatinine Ratio: 14 (ref 9–20)
BUN: 9 mg/dL (ref 6–24)
Basophils Absolute: 0.1 x10E3/uL (ref 0.0–0.2)
Basos: 1 %
Bilirubin Total: 1.4 mg/dL — ABNORMAL HIGH (ref 0.0–1.2)
CO2: 22 mmol/L (ref 20–29)
Calcium: 9.9 mg/dL (ref 8.7–10.2)
Chloride: 102 mmol/L (ref 96–106)
Creatinine, Ser: 0.65 mg/dL — ABNORMAL LOW (ref 0.76–1.27)
EOS (ABSOLUTE): 0.2 x10E3/uL (ref 0.0–0.4)
Eos: 3 %
Ferritin: 453 ng/mL — ABNORMAL HIGH (ref 30–400)
Globulin, Total: 3.5 g/dL (ref 1.5–4.5)
Glucose: 82 mg/dL (ref 70–99)
Hematocrit: 28.2 % — ABNORMAL LOW (ref 37.5–51.0)
Hemoglobin: 9.2 g/dL — ABNORMAL LOW (ref 13.0–17.7)
Immature Grans (Abs): 0.1 x10E3/uL (ref 0.0–0.1)
Immature Granulocytes: 1 %
Lymphocytes Absolute: 3.1 x10E3/uL (ref 0.7–3.1)
Lymphs: 36 %
MCH: 28.8 pg (ref 26.6–33.0)
MCHC: 32.6 g/dL (ref 31.5–35.7)
MCV: 88 fL (ref 79–97)
Monocytes Absolute: 0.7 x10E3/uL (ref 0.1–0.9)
Monocytes: 8 %
NRBC: 1 % — ABNORMAL HIGH (ref 0–0)
Neutrophils Absolute: 4.6 x10E3/uL (ref 1.4–7.0)
Neutrophils: 51 %
Platelets: 1205 x10E3/uL (ref 150–450)
Potassium: 5.1 mmol/L (ref 3.5–5.2)
RBC: 3.19 x10E6/uL — ABNORMAL LOW (ref 4.14–5.80)
RDW: 18.7 % — ABNORMAL HIGH (ref 11.6–15.4)
Retic Ct Pct: 4.5 % — ABNORMAL HIGH (ref 0.6–2.6)
Sodium: 138 mmol/L (ref 134–144)
Total Protein: 7.7 g/dL (ref 6.0–8.5)
Vit D, 25-Hydroxy: 10.1 ng/mL — ABNORMAL LOW (ref 30.0–100.0)
WBC: 8.7 x10E3/uL (ref 3.4–10.8)
eGFR: 121 mL/min/1.73

## 2024-08-14 LAB — LAB REPORT - SCANNED: EGFR: 121

## 2024-08-16 ENCOUNTER — Ambulatory Visit: Payer: Self-pay | Admitting: Nurse Practitioner

## 2024-08-16 ENCOUNTER — Other Ambulatory Visit (HOSPITAL_COMMUNITY): Payer: Self-pay

## 2024-08-16 DIAGNOSIS — R7989 Other specified abnormal findings of blood chemistry: Secondary | ICD-10-CM

## 2024-08-16 MED ORDER — ASPIRIN 81 MG PO TBEC
81.0000 mg | DELAYED_RELEASE_TABLET | Freq: Every day | ORAL | 1 refills | Status: AC
  Filled 2024-08-16: qty 90, 90d supply, fill #0

## 2024-08-17 LAB — TOXASSURE FLEX 15, UR
6-ACETYLMORPHINE IA: NEGATIVE ng/mL
7-aminoclonazepam: NOT DETECTED ng/mg{creat}
AMPHETAMINES IA: NEGATIVE ng/mL
Alpha-hydroxyalprazolam: NOT DETECTED ng/mg{creat}
Alpha-hydroxymidazolam: NOT DETECTED ng/mg{creat}
Alpha-hydroxytriazolam: NOT DETECTED ng/mg{creat}
Alprazolam: NOT DETECTED ng/mg{creat}
BARBITURATES IA: NEGATIVE ng/mL
Buprenorphine: NOT DETECTED ng/mg{creat}
CANNABINOIDS IA: NEGATIVE ng/mL
COCAINE METABOLITE IA: NEGATIVE ng/mL
Clonazepam: NOT DETECTED ng/mg{creat}
Creatinine: 48 mg/dL
Desalkylflurazepam: NOT DETECTED ng/mg{creat}
Desmethyldiazepam: NOT DETECTED ng/mg{creat}
Desmethylflunitrazepam: NOT DETECTED ng/mg{creat}
Diazepam: NOT DETECTED ng/mg{creat}
ETHYL ALCOHOL Enzymatic: NEGATIVE g/dL
Fentanyl: NOT DETECTED ng/mg{creat}
Flunitrazepam: NOT DETECTED ng/mg{creat}
Lorazepam: NOT DETECTED ng/mg{creat}
METHADONE IA: NEGATIVE ng/mL
METHADONE MTB IA: NEGATIVE ng/mL
Midazolam: NOT DETECTED ng/mg{creat}
Norbuprenorphine: NOT DETECTED ng/mg{creat}
Norfentanyl: NOT DETECTED ng/mg{creat}
Oxazepam: NOT DETECTED ng/mg{creat}
PHENCYCLIDINE IA: NEGATIVE ng/mL
TAPENTADOL, IA: NEGATIVE ng/mL
TRAMADOL IA: NEGATIVE ng/mL
Temazepam: NOT DETECTED ng/mg{creat}

## 2024-08-17 LAB — OPIATE CLASS, MS, UR RFX
Codeine: NOT DETECTED ng/mg{creat}
Dihydrocodeine: NOT DETECTED ng/mg{creat}
Hydrocodone: NOT DETECTED ng/mg{creat}
Hydromorphone: NOT DETECTED ng/mg{creat}
Morphine: NOT DETECTED ng/mg{creat}
Norcodeine: NOT DETECTED ng/mg{creat}
Norhydrocodone: NOT DETECTED ng/mg{creat}
Normorphine: NOT DETECTED ng/mg{creat}
Opiate Class Confirmation: NEGATIVE

## 2024-08-17 LAB — OXYCODONE CLASS, MS, UR RFX
Noroxycodone: 3890 ng/mg{creat}
Noroxymorphone: 608 ng/mg{creat}
Oxycodone Class Confirmation: POSITIVE
Oxycodone: 635 ng/mg{creat}
Oxymorphone: 1585 ng/mg{creat}

## 2024-08-19 ENCOUNTER — Other Ambulatory Visit: Payer: Self-pay

## 2024-08-19 DIAGNOSIS — R7989 Other specified abnormal findings of blood chemistry: Secondary | ICD-10-CM

## 2024-08-20 ENCOUNTER — Ambulatory Visit: Payer: Self-pay | Admitting: Nurse Practitioner

## 2024-08-20 LAB — CBC WITH DIFFERENTIAL/PLATELET
Basophils Absolute: 0.1 x10E3/uL (ref 0.0–0.2)
Basos: 1 %
EOS (ABSOLUTE): 0.3 x10E3/uL (ref 0.0–0.4)
Eos: 3 %
Hematocrit: 30.2 % — ABNORMAL LOW (ref 37.5–51.0)
Hemoglobin: 9.8 g/dL — ABNORMAL LOW (ref 13.0–17.7)
Immature Grans (Abs): 0 x10E3/uL (ref 0.0–0.1)
Immature Granulocytes: 0 %
Lymphocytes Absolute: 3.6 x10E3/uL — ABNORMAL HIGH (ref 0.7–3.1)
Lymphs: 39 %
MCH: 29.5 pg (ref 26.6–33.0)
MCHC: 32.5 g/dL (ref 31.5–35.7)
MCV: 91 fL (ref 79–97)
Monocytes Absolute: 0.7 x10E3/uL (ref 0.1–0.9)
Monocytes: 7 %
NRBC: 1 % — ABNORMAL HIGH (ref 0–0)
Neutrophils Absolute: 4.6 x10E3/uL (ref 1.4–7.0)
Neutrophils: 50 %
Platelets: 828 x10E3/uL (ref 150–450)
RBC: 3.32 x10E6/uL — ABNORMAL LOW (ref 4.14–5.80)
RDW: 18.9 % — ABNORMAL HIGH (ref 11.6–15.4)
WBC: 9.2 x10E3/uL (ref 3.4–10.8)

## 2024-08-23 ENCOUNTER — Encounter: Payer: Self-pay | Admitting: Nurse Practitioner

## 2024-08-26 ENCOUNTER — Other Ambulatory Visit (HOSPITAL_COMMUNITY): Payer: Self-pay

## 2024-08-26 ENCOUNTER — Other Ambulatory Visit: Payer: Self-pay | Admitting: Nurse Practitioner

## 2024-08-26 DIAGNOSIS — D5700 Hb-SS disease with crisis, unspecified: Secondary | ICD-10-CM

## 2024-08-26 DIAGNOSIS — G894 Chronic pain syndrome: Secondary | ICD-10-CM

## 2024-08-26 NOTE — Telephone Encounter (Signed)
oxyCODONE-acetaminophen (PERCOCET) 10-325 MG tablet  

## 2024-08-26 NOTE — Telephone Encounter (Signed)
 Please advise [facility]

## 2024-08-27 ENCOUNTER — Other Ambulatory Visit (HOSPITAL_COMMUNITY): Payer: Self-pay

## 2024-08-27 MED ORDER — OXYCODONE-ACETAMINOPHEN 10-325 MG PO TABS
0.5000 | ORAL_TABLET | Freq: Four times a day (QID) | ORAL | 0 refills | Status: DC | PRN
  Filled 2024-08-27: qty 30, 8d supply, fill #0

## 2024-09-16 ENCOUNTER — Other Ambulatory Visit (HOSPITAL_COMMUNITY): Payer: Self-pay

## 2024-09-16 ENCOUNTER — Other Ambulatory Visit: Payer: Self-pay | Admitting: Nurse Practitioner

## 2024-09-16 DIAGNOSIS — G894 Chronic pain syndrome: Secondary | ICD-10-CM

## 2024-09-16 DIAGNOSIS — D5700 Hb-SS disease with crisis, unspecified: Secondary | ICD-10-CM

## 2024-09-16 NOTE — Telephone Encounter (Signed)
 Please Advise.  CB.

## 2024-09-16 NOTE — Telephone Encounter (Signed)
oxyCODONE-acetaminophen (PERCOCET) 10-325 MG tablet  

## 2024-09-18 ENCOUNTER — Other Ambulatory Visit (HOSPITAL_COMMUNITY): Payer: Self-pay

## 2024-09-18 MED ORDER — OXYCODONE-ACETAMINOPHEN 10-325 MG PO TABS
0.5000 | ORAL_TABLET | Freq: Four times a day (QID) | ORAL | 0 refills | Status: AC | PRN
  Filled 2024-09-18: qty 30, 8d supply, fill #0

## 2024-09-19 ENCOUNTER — Other Ambulatory Visit: Payer: Self-pay

## 2024-09-19 DIAGNOSIS — D5700 Hb-SS disease with crisis, unspecified: Secondary | ICD-10-CM

## 2024-09-19 DIAGNOSIS — G894 Chronic pain syndrome: Secondary | ICD-10-CM

## 2024-09-19 NOTE — Telephone Encounter (Signed)
 Copied from CRM [id number]. Topic: Clinical - Medication Question >> Sep 19, 2024  3:53 PM Montie POUR wrote: Reason for CRM:  Mr. Adams has a question about medication Rx #: [id number] oxyCODONE -acetaminophen  (PERCOCET) 10-325 MG tablet. He wants to see if NP Oley will send in a new order like it was before. He has to order it every 8 days and he used to order it every 2 weeks. Can she put it back to order it every 2 weeks. Please call him at [phone] to let him know it she will do this. Thanks  Please Advise.  CB.

## 2024-11-11 ENCOUNTER — Ambulatory Visit: Payer: Self-pay | Admitting: Nurse Practitioner
